# Patient Record
Sex: Female | Born: 1950
Health system: Southern US, Community
[De-identification: ages and names within clinical notes are randomized; demographics above are authoritative.]

## PROBLEM LIST (undated history)

## (undated) DIAGNOSIS — E039 Hypothyroidism, unspecified: Secondary | ICD-10-CM

## (undated) DIAGNOSIS — T7840XA Allergy, unspecified, initial encounter: Secondary | ICD-10-CM

## (undated) DIAGNOSIS — N959 Unspecified menopausal and perimenopausal disorder: Secondary | ICD-10-CM

## (undated) DIAGNOSIS — F329 Major depressive disorder, single episode, unspecified: Secondary | ICD-10-CM

## (undated) DIAGNOSIS — K219 Gastro-esophageal reflux disease without esophagitis: Secondary | ICD-10-CM

## (undated) DIAGNOSIS — E785 Hyperlipidemia, unspecified: Secondary | ICD-10-CM

## (undated) DIAGNOSIS — I252 Old myocardial infarction: Secondary | ICD-10-CM

## (undated) DIAGNOSIS — M810 Age-related osteoporosis without current pathological fracture: Secondary | ICD-10-CM

## (undated) DIAGNOSIS — F419 Anxiety disorder, unspecified: Secondary | ICD-10-CM

## (undated) DIAGNOSIS — M353 Polymyalgia rheumatica: Secondary | ICD-10-CM

## (undated) DIAGNOSIS — F32A Depression, unspecified: Secondary | ICD-10-CM

## (undated) DIAGNOSIS — M1990 Unspecified osteoarthritis, unspecified site: Secondary | ICD-10-CM

## (undated) DIAGNOSIS — H269 Unspecified cataract: Secondary | ICD-10-CM

## (undated) DIAGNOSIS — I2510 Atherosclerotic heart disease of native coronary artery without angina pectoris: Secondary | ICD-10-CM

## (undated) HISTORY — DX: Age-related osteoporosis without current pathological fracture: M81.0

## (undated) HISTORY — DX: Unspecified osteoarthritis, unspecified site: M19.90

## (undated) HISTORY — PX: EYE SURGERY: SHX253

## (undated) HISTORY — DX: Unspecified cataract: H26.9

## (undated) HISTORY — PX: PTCA: SHX146

## (undated) HISTORY — DX: Depression, unspecified: F32.A

## (undated) HISTORY — DX: Unspecified menopausal and perimenopausal disorder: N95.9

## (undated) HISTORY — DX: Allergy, unspecified, initial encounter: T78.40XA

## (undated) HISTORY — DX: Gastro-esophageal reflux disease without esophagitis: K21.9

## (undated) HISTORY — DX: Anxiety disorder, unspecified: F41.9

## (undated) HISTORY — PX: CATARACT EXTRACTION: SUR2

## (undated) HISTORY — DX: Hyperlipidemia, unspecified: E78.5

## (undated) HISTORY — DX: Atherosclerotic heart disease of native coronary artery without angina pectoris: I25.10

## (undated) HISTORY — PX: COSMETIC SURGERY: SHX468

## (undated) HISTORY — DX: Old myocardial infarction: I25.2

## (undated) HISTORY — DX: Major depressive disorder, single episode, unspecified: F32.9

---

## 1998-11-15 ENCOUNTER — Encounter (INDEPENDENT_AMBULATORY_CARE_PROVIDER_SITE_OTHER): Payer: Self-pay | Admitting: Specialist

## 1998-11-15 ENCOUNTER — Other Ambulatory Visit: Admission: RE | Admit: 1998-11-15 | Discharge: 1998-11-15 | Payer: Self-pay | Admitting: Obstetrics & Gynecology

## 1999-02-13 DIAGNOSIS — E039 Hypothyroidism, unspecified: Secondary | ICD-10-CM | POA: Insufficient documentation

## 1999-08-17 ENCOUNTER — Other Ambulatory Visit: Admission: RE | Admit: 1999-08-17 | Discharge: 1999-08-17 | Payer: Self-pay | Admitting: Obstetrics & Gynecology

## 2000-09-19 ENCOUNTER — Other Ambulatory Visit: Admission: RE | Admit: 2000-09-19 | Discharge: 2000-09-19 | Payer: Self-pay | Admitting: Obstetrics & Gynecology

## 2001-09-22 ENCOUNTER — Emergency Department (HOSPITAL_COMMUNITY): Admission: EM | Admit: 2001-09-22 | Discharge: 2001-09-22 | Payer: Self-pay

## 2001-09-29 ENCOUNTER — Other Ambulatory Visit: Admission: RE | Admit: 2001-09-29 | Discharge: 2001-09-29 | Payer: Self-pay | Admitting: Obstetrics & Gynecology

## 2002-06-20 ENCOUNTER — Encounter: Payer: Self-pay | Admitting: Internal Medicine

## 2002-06-20 ENCOUNTER — Encounter: Admission: RE | Admit: 2002-06-20 | Discharge: 2002-06-20 | Payer: Self-pay | Admitting: Internal Medicine

## 2002-07-02 ENCOUNTER — Encounter: Admission: RE | Admit: 2002-07-02 | Discharge: 2002-07-02 | Payer: Self-pay | Admitting: Internal Medicine

## 2002-07-02 ENCOUNTER — Encounter: Payer: Self-pay | Admitting: Internal Medicine

## 2003-05-07 ENCOUNTER — Emergency Department (HOSPITAL_COMMUNITY): Admission: EM | Admit: 2003-05-07 | Discharge: 2003-05-07 | Payer: Self-pay | Admitting: Emergency Medicine

## 2003-05-07 ENCOUNTER — Emergency Department (HOSPITAL_COMMUNITY): Admission: AD | Admit: 2003-05-07 | Discharge: 2003-05-07 | Payer: Self-pay | Admitting: Family Medicine

## 2003-06-22 ENCOUNTER — Emergency Department (HOSPITAL_COMMUNITY): Admission: EM | Admit: 2003-06-22 | Discharge: 2003-06-22 | Payer: Self-pay | Admitting: Family Medicine

## 2004-04-07 ENCOUNTER — Ambulatory Visit: Payer: Self-pay | Admitting: Internal Medicine

## 2004-04-12 ENCOUNTER — Ambulatory Visit: Payer: Self-pay | Admitting: Internal Medicine

## 2004-05-24 ENCOUNTER — Ambulatory Visit: Payer: Self-pay | Admitting: Internal Medicine

## 2004-06-14 ENCOUNTER — Ambulatory Visit: Payer: Self-pay | Admitting: Internal Medicine

## 2004-06-29 ENCOUNTER — Ambulatory Visit: Payer: Self-pay | Admitting: Internal Medicine

## 2004-06-29 ENCOUNTER — Inpatient Hospital Stay (HOSPITAL_COMMUNITY): Admission: EM | Admit: 2004-06-29 | Discharge: 2004-07-03 | Payer: Self-pay | Admitting: Emergency Medicine

## 2004-06-30 ENCOUNTER — Ambulatory Visit: Payer: Self-pay | Admitting: *Deleted

## 2004-07-13 ENCOUNTER — Ambulatory Visit: Payer: Self-pay | Admitting: Cardiology

## 2004-07-27 ENCOUNTER — Ambulatory Visit: Payer: Self-pay | Admitting: *Deleted

## 2004-08-23 ENCOUNTER — Ambulatory Visit: Payer: Self-pay | Admitting: Cardiology

## 2004-10-26 ENCOUNTER — Ambulatory Visit: Payer: Self-pay | Admitting: Cardiology

## 2005-07-05 ENCOUNTER — Ambulatory Visit: Payer: Self-pay | Admitting: Internal Medicine

## 2005-09-17 ENCOUNTER — Ambulatory Visit: Payer: Self-pay | Admitting: Cardiology

## 2005-10-16 ENCOUNTER — Ambulatory Visit: Payer: Self-pay | Admitting: Internal Medicine

## 2006-03-08 ENCOUNTER — Ambulatory Visit: Payer: Self-pay | Admitting: Internal Medicine

## 2007-03-20 ENCOUNTER — Ambulatory Visit: Payer: Self-pay | Admitting: Internal Medicine

## 2007-03-20 DIAGNOSIS — N959 Unspecified menopausal and perimenopausal disorder: Secondary | ICD-10-CM | POA: Insufficient documentation

## 2007-03-20 DIAGNOSIS — F32A Depression, unspecified: Secondary | ICD-10-CM | POA: Insufficient documentation

## 2007-03-20 DIAGNOSIS — F329 Major depressive disorder, single episode, unspecified: Secondary | ICD-10-CM

## 2007-03-20 DIAGNOSIS — J189 Pneumonia, unspecified organism: Secondary | ICD-10-CM | POA: Insufficient documentation

## 2007-03-20 HISTORY — DX: Unspecified menopausal and perimenopausal disorder: N95.9

## 2007-03-24 ENCOUNTER — Ambulatory Visit: Payer: Self-pay | Admitting: Cardiology

## 2007-09-01 ENCOUNTER — Ambulatory Visit: Payer: Self-pay | Admitting: Internal Medicine

## 2007-09-01 LAB — CONVERTED CEMR LAB
Basophils Absolute: 0 10*3/uL (ref 0.0–0.1)
Basophils Relative: 0.8 % (ref 0.0–3.0)
Bilirubin, Direct: 0.1 mg/dL (ref 0.0–0.3)
Calcium: 9.4 mg/dL (ref 8.4–10.5)
Eosinophils Absolute: 0.3 10*3/uL (ref 0.0–0.7)
Eosinophils Relative: 5.5 % — ABNORMAL HIGH (ref 0.0–5.0)
GFR calc Af Amer: 111 mL/min
Hemoglobin: 13.3 g/dL (ref 12.0–15.0)
LDL Cholesterol: 130 mg/dL — ABNORMAL HIGH (ref 0–99)
MCV: 91.5 fL (ref 78.0–100.0)
Monocytes Absolute: 0.4 10*3/uL (ref 0.1–1.0)
Neutro Abs: 3.5 10*3/uL (ref 1.4–7.7)
RBC: 4.22 M/uL (ref 3.87–5.11)
Sodium: 139 meq/L (ref 135–145)
Total Bilirubin: 1 mg/dL (ref 0.3–1.2)
Total CHOL/HDL Ratio: 4
Triglycerides: 83 mg/dL (ref 0–149)
WBC: 5.4 10*3/uL (ref 4.5–10.5)

## 2007-09-09 ENCOUNTER — Ambulatory Visit: Payer: Self-pay | Admitting: Internal Medicine

## 2007-09-09 DIAGNOSIS — I252 Old myocardial infarction: Secondary | ICD-10-CM

## 2007-09-09 DIAGNOSIS — R319 Hematuria, unspecified: Secondary | ICD-10-CM | POA: Insufficient documentation

## 2007-09-09 DIAGNOSIS — I2510 Atherosclerotic heart disease of native coronary artery without angina pectoris: Secondary | ICD-10-CM | POA: Insufficient documentation

## 2007-09-09 HISTORY — DX: Old myocardial infarction: I25.2

## 2007-09-09 LAB — HM MAMMOGRAPHY

## 2007-11-21 ENCOUNTER — Encounter: Payer: Self-pay | Admitting: Internal Medicine

## 2007-12-02 ENCOUNTER — Encounter: Payer: Self-pay | Admitting: Internal Medicine

## 2007-12-03 ENCOUNTER — Telehealth: Payer: Self-pay | Admitting: Internal Medicine

## 2008-02-19 ENCOUNTER — Ambulatory Visit: Payer: Self-pay | Admitting: Cardiology

## 2008-02-25 ENCOUNTER — Ambulatory Visit: Payer: Self-pay | Admitting: Internal Medicine

## 2008-02-25 ENCOUNTER — Encounter: Payer: Self-pay | Admitting: Internal Medicine

## 2008-02-25 LAB — CONVERTED CEMR LAB
HDL: 45.6 mg/dL (ref >39.0)
Total CHOL/HDL Ratio: 3.9
VLDL: 10 mg/dL (ref 0–40)

## 2008-03-29 ENCOUNTER — Telehealth: Payer: Self-pay | Admitting: Internal Medicine

## 2008-04-08 ENCOUNTER — Telehealth: Payer: Self-pay | Admitting: Internal Medicine

## 2008-08-08 ENCOUNTER — Emergency Department (HOSPITAL_COMMUNITY): Admission: EM | Admit: 2008-08-08 | Discharge: 2008-08-08 | Payer: Self-pay | Admitting: Emergency Medicine

## 2008-12-06 ENCOUNTER — Encounter: Payer: Self-pay | Admitting: Internal Medicine

## 2008-12-06 ENCOUNTER — Telehealth: Payer: Self-pay | Admitting: Internal Medicine

## 2008-12-06 ENCOUNTER — Encounter: Payer: Self-pay | Admitting: Cardiology

## 2009-01-10 ENCOUNTER — Ambulatory Visit: Payer: Self-pay | Admitting: Internal Medicine

## 2009-01-10 ENCOUNTER — Telehealth: Payer: Self-pay | Admitting: Internal Medicine

## 2009-01-11 LAB — CONVERTED CEMR LAB
BUN: 9 mg/dL (ref 6–23)
Basophils Relative: 1.2 % (ref 0.0–3.0)
CO2: 30 meq/L (ref 19–32)
Calcium: 8.9 mg/dL (ref 8.4–10.5)
Cholesterol: 260 mg/dL — ABNORMAL HIGH (ref 0–200)
Direct LDL: 193.2 mg/dL
Eosinophils Absolute: 0.4 10*3/uL (ref 0.0–0.7)
Eosinophils Relative: 7.6 % — ABNORMAL HIGH (ref 0.0–5.0)
Hemoglobin: 12.9 g/dL (ref 12.0–15.0)
Lymphs Abs: 1.3 10*3/uL (ref 0.7–4.0)
MCHC: 33.7 g/dL (ref 30.0–36.0)
Monocytes Absolute: 0.3 10*3/uL (ref 0.1–1.0)
Neutro Abs: 3 10*3/uL (ref 1.4–7.7)
Platelets: 208 10*3/uL (ref 150.0–400.0)
Potassium: 4.5 meq/L (ref 3.5–5.1)
RBC: 4.14 M/uL (ref 3.87–5.11)
Total Bilirubin: 0.7 mg/dL (ref 0.3–1.2)
Total Protein: 7 g/dL (ref 6.0–8.3)
Triglycerides: 57 mg/dL (ref 0.0–149.0)
VLDL: 11.4 mg/dL (ref 0.0–40.0)
WBC: 5.1 10*3/uL (ref 4.5–10.5)

## 2009-03-10 ENCOUNTER — Ambulatory Visit: Payer: Self-pay | Admitting: Internal Medicine

## 2009-03-23 ENCOUNTER — Encounter: Payer: Self-pay | Admitting: Internal Medicine

## 2009-03-24 ENCOUNTER — Telehealth: Payer: Self-pay | Admitting: *Deleted

## 2009-06-08 ENCOUNTER — Encounter: Payer: Self-pay | Admitting: Internal Medicine

## 2009-06-30 ENCOUNTER — Ambulatory Visit (HOSPITAL_COMMUNITY): Admission: RE | Admit: 2009-06-30 | Discharge: 2009-06-30 | Payer: Self-pay | Admitting: Obstetrics & Gynecology

## 2009-07-12 ENCOUNTER — Telehealth: Payer: Self-pay | Admitting: Internal Medicine

## 2009-10-27 ENCOUNTER — Telehealth: Payer: Self-pay | Admitting: Internal Medicine

## 2010-01-19 ENCOUNTER — Telehealth: Payer: Self-pay | Admitting: Internal Medicine

## 2010-01-20 ENCOUNTER — Emergency Department (HOSPITAL_COMMUNITY)
Admission: EM | Admit: 2010-01-20 | Discharge: 2010-01-21 | Payer: Self-pay | Source: Home / Self Care | Admitting: Emergency Medicine

## 2010-01-23 ENCOUNTER — Encounter: Payer: Self-pay | Admitting: Internal Medicine

## 2010-03-14 NOTE — Letter (Signed)
Summary: [facility]   Imported By: Maryln Gottron 06/20/2009 11:19:30  _____________________________________________________________________  External Attachment:    Type:   Image     Comment:   External Document

## 2010-03-14 NOTE — Progress Notes (Signed)
Summary: positive strep  Phone Note Call from Patient Call back at [facility] Phone [phone]   Summary of Call: Tested positive strept this am on the job, [employer].  Request med.  [facility].  Allergic Septra. Initial call taken by: Rudy Jew, RN,  Jul 12, 2009 8:38 AM  Follow-up for Phone Call        Phone Call Completed Follow-up by: Rudy Jew, RN,  Jul 12, 2009 2:33 PM    New/Updated Medications: AMOXICILLIN 500 MG TABS (AMOXICILLIN) Take 1 tablet by mouth three times a day Prescriptions: AMOXICILLIN 500 MG TABS (AMOXICILLIN) Take 1 tablet by mouth three times a day  #30 x 0   Entered and Authorized by:   Birdie Sons MD   Signed by:   Birdie Sons MD on 07/12/2009   Method used:   Electronically to        [facility] [address] (retail)       [address]       [place], [place]  [address]       Ph: [phone]       Fax: [phone]   RxID:   [id number]

## 2010-03-14 NOTE — Letter (Signed)
Summary: [facility]  [facility]   Imported By: Maryln Gottron 07/27/2009 14:22:40  _____________________________________________________________________  External Attachment:    Type:   Image     Comment:   External Document

## 2010-03-14 NOTE — Progress Notes (Signed)
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 Will fax results, would like med called in. ph- [phone] x [phone] Initial call taken by: Raechel Ache, RN,  March 24, 2009 10:01 AM  Follow-up for Phone Call        fax received and reviewed by dr Lovell Sheehan.per dr Lovell Sheehan cipro 500mg  two times a day x 5 days.  see Rx.  Left message on work phone to notify pt. Follow-up by: Gladis Riffle, RN,  March 24, 2009 12:59 PM    New/Updated Medications: CIPROFLOXACIN HCL 500 MG TABS (CIPROFLOXACIN HCL) Take 1 tablet by mouth two times a day x 5 days Prescriptions: CIPROFLOXACIN HCL 500 MG TABS (CIPROFLOXACIN HCL) Take 1 tablet by mouth two times a day x 5 days  #10 x 0   Entered by:   Gladis Riffle, RN   Authorized by:   Stacie Glaze MD   Signed by:   Gladis Riffle, RN on 03/24/2009   Method used:   Electronically to        [facility] (retail)       [address]       [place], [place]  [address]       Ph: [phone]       Fax: [phone]   RxID:   [id number]

## 2010-03-14 NOTE — Letter (Signed)
Summary: [facility]  [facility]   Imported By: Kassie Mends 02/28/2009 08:28:53  _____________________________________________________________________  External Attachment:    Type:   Image     Comment:   External Document

## 2010-03-14 NOTE — Progress Notes (Signed)
Summary: requesting 1 Xanax to be called  Phone Note Call from Patient Call back at Home Phone [phone]   Caller: Patient---triage vm Summary of Call: Having minor surgery done. requesting a rx for xanax lowest strength #1, in order for her to relax during numbing . please call [facility] [address]. Initial call taken by: Warnell Forester,  January 19, 2010 9:41 AM  Follow-up for Phone Call        not a good idea to mix medications Follow-up by: Birdie Sons MD,  January 19, 2010 6:06 PM  Additional Follow-up for Phone Call Additional follow up Details #1::        l/m on pt's cell phone with instructions Additional Follow-up by: Alfred Levins, CMA,  January 20, 2010 8:29 AM

## 2010-03-14 NOTE — Progress Notes (Signed)
Summary: sleep RX  Phone Note Call from Patient Call back at Home Phone [phone]   Caller: Patient Call For: Birdie Sons MD Summary of Call: Pt is asking for Rx for sleep......Marland KitchenHusband has lost job, and son has moved back home.  [facility])   Initial call taken by: Lynann Beaver CMA,  October 27, 2009 11:34 AM    New/Updated Medications: TRAZODONE HCL 50 MG TABS (TRAZODONE HCL) 1/2-1 by mouth at bedtime as needed insomnia Prescriptions: TRAZODONE HCL 50 MG TABS (TRAZODONE HCL) 1/2-1 by mouth at bedtime as needed insomnia  #30 x 1   Entered and Authorized by:   Birdie Sons MD   Signed by:   Birdie Sons MD on 10/27/2009   Method used:   Electronically to        [facility] [address] (retail)       [address]       [place], [place]  [address]       Ph: [phone]       Fax: [phone]   RxID:   [id number]  Notified Pt.

## 2010-03-16 ENCOUNTER — Other Ambulatory Visit: Payer: Self-pay | Admitting: Internal Medicine

## 2010-03-16 DIAGNOSIS — E0590 Thyrotoxicosis, unspecified without thyrotoxic crisis or storm: Secondary | ICD-10-CM

## 2010-03-16 NOTE — Letter (Signed)
Summary: [facility]   Imported By: Maryln Gottron 02/08/2010 10:13:00  _____________________________________________________________________  External Attachment:    Type:   Image     Comment:   External Document

## 2010-05-30 ENCOUNTER — Other Ambulatory Visit (HOSPITAL_COMMUNITY): Payer: Self-pay | Admitting: Obstetrics & Gynecology

## 2010-05-30 DIAGNOSIS — Z1231 Encounter for screening mammogram for malignant neoplasm of breast: Secondary | ICD-10-CM

## 2010-06-27 NOTE — Assessment & Plan Note (Signed)
[facility]                            CARDIOLOGY OFFICE NOTE   Meghan, [name]                       MRN:          [id number]  DATE:03/24/2007                            DOB:          Aug 02, 1950    PRIMARY CARE PHYSICIAN:  Meghan Mole. Swords, MD   REASON FOR PRESENTATION:  Non-Q-wave myocardial infarction.   HISTORY OF PRESENT ILLNESS:  The patient is now 60 years old.  It has  been about 18 months since I last saw her.  She had been doing well.  She had a non-Q-wave myocardial infarction in 2006.  She had chest pain  at that time.  Since then, she has had no further chest discomfort.  She  walks 3-4 times per week for exercise.  With this level of activity, she  denies any chest discomfort, neck discomfort, arm discomfort, activity  induced nausea, vomiting, excessive diaphoresis. He had no palpitations,  pre-syncope, syncope.  She denies any PND or orthopnea.   PAST MEDICAL HISTORY:  1. Coronary artery disease (ejection fraction 60%, LAD normal,      circumflex obtuse marginal was a 1.5 mm vessel with 99% stenosis.      The patient had angioplasty with dissection with reserve residual      50% stenosis.  She had a dominant right coronary artery which was      normal.).  2. Dyslipidemia.  3. Mild depression.  4. Hypothyroidism.  5. Osteoporosis.  6. Old cerebrovascular accident on MRI.  7. Cesarean section.   ALLERGIES:  COMPAZINE.   MEDICATIONS:  1. Aspirin 81 mg daily.  2. Synthroid.  3. Micrograms daily.  4. Paxil 20 mg daily.  5. Simvastatin.   REVIEW OF SYSTEMS:  As stated as stated in HPI and otherwise negative  for other systems.   PHYSICAL EXAMINATION:  The patient is in no distress.  Blood pressure  106/76, heart rate 71 and regular, weight 151 pounds, body mass index  27.  HEENT:  Eyelids are, pupils equal, round, reactive, fundi not  visualized, oral mucosa normal.  NECK:  No jugular distention at 45 degrees, carotid  upstroke brisk and  symmetrical.  No bruits, no thyromegaly.  LYMPHATICS: No cervical, axillary or inguinal adenopathy.  LUNGS: Clear to auscultation bilaterally.  BACK: No costovertebral angle tenderness.  CHEST: Unremarkable.  HEART: PMI not displaced or sustained. S1, S2 within normal limits. No  S3. No S4. No clicks, rub or murmurs.  ABDOMEN: Flat, positive bowel sounds, normal in frequency and pitch. No  bruits. No rebounds. No guarding. No midline pulsatile mass. No  hepatomegaly, no splenomegaly.  SKIN: No rashes, no nodules.  EXTREMITIES: 2+ pulses throughout. No edema. No cyanosis or clubbing.  NEURO: Oriented to person, place and time. Cranial nerves II-XII grossly  intact. Motor grossly intact.   EKG: Sinus rhythm, rate 67, axis within normal limits, intervals within  normal limits, no acute ST-T wave changes, nonspecific T-wave  flattening.   ASSESSMENT/PLAN:  1. Coronary disease.  Patient is having no ongoing symptoms.  She is      having good  secondary risk reduction.  At this point, no further      cardiovascular testing is suggested.  2. Followup.  The patient will follow with Dr. Cato Mulligan and come back to      see me as needed.     Rollene Rotunda, MD, [facility]  Electronically Signed    JH/MedQ  DD: 03/24/2007  DT: 03/24/2007  Job #: [id number]   cc:   Meghan Mole. Swords, MD

## 2010-06-27 NOTE — Assessment & Plan Note (Signed)
[facility]                            CARDIOLOGY OFFICE NOTE   [name], SAPERSTEIN                       MRN:          [id number]  DATE:02/19/2008                            DOB:          [birth year]    PRIMARY CARE PHYSICIAN:  Valetta Mole. Swords, MD   REASON FOR PRESENTATION:  Evaluate the patient with non-Q-wave  myocardial infarction.   HISTORY OF PRESENT ILLNESS:  The patient is 60 years old.  She is  referred back for preoperative evaluation.  Dr. Ophelia Charter is sending her.  He is planning on doing eyelid surgery.  I have been following her after  non-Q-wave myocardial infarction, which she was due to come back to this  clinic only as needed.  She is followed by Dr. Cato Mulligan.   Since I last saw her in February, she has had no new chest discomfort.  She is not exercising as much as I would like, though she is walking 3  times a day and sometimes using a rolling machine in the house.  She can  climb a flight of stairs (greater than 5 METS).  With this level of  activity, she denies any chest discomfort, neck or arm discomfort.  She  has no palpitations, presyncope, or syncope.  She denies any PND or  orthopnea.  She thinks her breathing is fine and she is not having any  change in the level of activity.   She did have her lipids checked with an LDL of 130 and had her Zocor  increased from 40 to 80.  She is due to have this checked next month by  Dr. Cato Mulligan.   PAST MEDICAL HISTORY:  Coronary artery disease (EF 60%, LAD normal,  circumflex obtuse marginal is 1.5 mm vessel with 99% stenosis.  The  patient had an angioplasty with dissection with residual 50% stenosis.  She had a dominant right coronary artery, which was normal),  dyslipidemia, mild depression, hypothyroidism, osteoporosis, old  cerebrovascular accident on MRI, and cesarean section.   ALLERGIES:  COMPAZINE.   MEDICATIONS:  1. Zocor 80 mg daily.  2. Aspirin 81 mg daily.  3. Synthroid  100 mcg daily.  4. Paxil 20 mg daily.   REVIEW OF SYSTEMS:  As stated in the HPI and otherwise negative for all  other systems.   PHYSICAL EXAMINATION:  GENERAL:  The patient is pleasant in no distress.  VITAL SIGNS:  Blood pressure of 126/79, heart rate 70 and regular,  weight 150 pounds, and body mass index is 27.  HEENT:  Eyelids unremarkable.  Pupils are equal, round, and reactive to  light.  Fundi not visualized.  Oral mucosa unremarkable.  NECK:  No jugular venous distention at 45 degrees.  Carotid upstroke  brisk and symmetric.  No bruits.  No thyromegaly.  LYMPHATICS:  No cervical, axillary, or inguinal adenopathy.  LUNGS:  Clear to auscultation bilaterally.  BACK:  No costovertebral angle tenderness.  CHEST:  Unremarkable.  HEART:  PMI not displaced or sustained.  S1 and S2 within normal limits.  No S3.  No S4.  No clicks.  No rubs.  No murmurs.  ABDOMEN:  Obese.  Positive bowel sounds.  Normal in frequency and pitch.  No bruits.  No rebound.  No guarding.  No midline pulsatile mass.  No  hepatomegaly.  No splenomegaly.  SKIN:  No rashes.  No nodule.  EXTREMITIES:  Pulses 2+ throughout.  No edema.  No cyanosis.  No  clubbing.  NEUROLOGIC:  Oriented to person, place, and time.  Cranial nerves II  through XII grossly intact.  Motor grossly intact.   ASSESSMENT AND PLAN:  1. Preoperative clearance.  The patient has no overt symptoms.  She      has a high functional level.  She is going for a low risk study      from a cardiovascular standpoint.  Therefore, according to ACC/AHA      guidelines, the patient is at acceptable risk for surgery without      further evaluation.  She can continue the meds as listed.  The      aspirin would be held after the discussion of the surgeon.  It is      felt safe to continue this with surgery.  2. Dyslipidemia.  Her LDL was 130.  She will get about a 6% reduction      with doubling of the simvastatin.  I would suggest this, if this is       the case, would need to be further treated.  The goal should be      less than 100 with an HDL greater than 50.  She will probably need      to be switched to different agent.  Dr. Cato Mulligan will follow up on      this.  3. Hypothyroidism, productive source.  4. Mild depression.  She remains on Paxil.  5. Followup.  She will come back to this clinic as needed.     Rollene Rotunda, MD, [facility]  Electronically Signed    JH/MedQ  DD: 02/19/2008  DT: 02/20/2008  Job #: [id number]   cc:   Valetta Mole. Swords, MD

## 2010-06-30 NOTE — Cardiovascular Report (Signed)
NAMEEMMACLAIRE, SWITALA                ACCOUNT NO.:  [id number]   MEDICAL RECORD NO.:  [id number]          PATIENT TYPE:  INP   LOCATION:  6523                         FACILITY:  [facility]   PHYSICIAN:  Carole Binning, M.D. LHCDATE OF BIRTH:  [birth year]   DATE OF PROCEDURE:  06/30/2004  DATE OF DISCHARGE:                              CARDIAC CATHETERIZATION   PROCEDURE PERFORMED:  1.  Left heart catheterization, left coronary angiography and left      ventriculography.  2.  Percutaneous transluminal coronary angioplasty of the first obtuse      marginal branch.   INDICATIONS:  The patient is a 60 year old woman with multiple cardiac risk  factors.  She was admitted to hospital with chest pain and ruled in for non-  ST-segment-elevation myocardial infarction.   CATHETERIZATION PROCEDURAL NOTE:  A 6-French sheath was placed in the right  femoral artery.  Coronary angiography was performed using 6-French JL-3.5  and JR4 catheters.  Left ventriculography was performed with an angled  pigtail catheter.  Contrast was Omnipaque.  There were no complications.   RESULTS:   HEMODYNAMICS:  Left ventricular pressure 152/14.  Aortic pressure 166/88.  There is no aortic valve gradient.   LEFT VENTRICULOGRAM:  Wall motion is normal, ejection fraction estimated at  60%.  There is no mitral regurgitation.   CORONARY ARTERIOGRAPHY:  Left main is normal.   Left anterior descending artery gives rise to 2 small- to normal-sized  diagonal branches.  The LAD is normal.   Left circumflex gives rise to a very long but slender first obtuse marginal  branch and small second and third obtuse marginal branches.  The first  obtuse marginal branch has a diffuse 99% stenosis with TIMI-2 flow.  This is  an approximately 1.5-mm-diameter vessel.   Right coronary is a dominant vessel.  The right coronary is normal in  appearance, giving rise to a large posterior descending artery, small first  and second  posterolateral branch and a large third posterolateral branch.   IMPRESSIONS:  1.  Preserved left ventricular systolic function.  2.  One-vessel coronary artery disease characterized by a subtotal occlusion      of a small-caliber, but fairly long first obtuse marginal branch.   PLAN:  Percutaneous coronary intervention.   PERCUTANEOUS TRANSLUMINAL CORONARY ANGIOPLASTY PROCEDURAL NOTE:  Following  completion of the diagnostic catheterization, we proceeded with percutaneous  coronary intervention.  Heparin and Integrilin were administered per  protocol.  We used a 6-French CLS 3.0 guiding catheter.  A Hi-Torque floppy  wire was advanced under fluoroscopic guidance into the distal aspect of the  first obtuse marginal branch.  We then performed PTCA with a 1.5 x 20-mm  Maverick balloon.  We performed multiple balloon inflations ranging from 8-  15 atmospheres in pressure.  These were performed throughout the length of  the lesion, which was approximately 30-mm long.  Multiple doses of  intracoronary nitroglycerin were administered as well.  Angiographic images  were obtained which revealed improvement of vessel lumen; however, there was  a long residual 50% stenosis with evidence of probable dissection  with  adventitial staining in the proximal portion of the vessel.  There was TIMI-  3 flow into the distal vessel.  These changes persisted,  despite multiple  balloon inflations, however, did improve somewhat after the final balloon  inflations.  The vessel was much too small-caliber to consider stent  placement.  We therefore opted to discontinue the procedure at that point.  The patient was hemodynamically stable.  She was transported to the holding  area in good condition.   COMPLICATIONS:  None.   RESULTS:  PTCA of the first obtuse marginal branch with limited success.  A  long 99% stenosis with TIMI-2 flow was reduced to long 50% residual with  TIMI-3 flow and what appeared to be a  dissection with adventitial staining.   PLAN:  The patient be admitted to the CCU for observation.  The Integrilin  will be discontinued at this point and she will be monitored closely.  She  will be treated medically for her residual coronary artery disease with  medications include aspirin.  In addition, Plavix will be added in the  morning if she remains stable.      MWP/MEDQ  D:  06/30/2004  T:  07/01/2004  Job:  [id number]   cc:   Valetta Mole. Swords, M.D. [facility]   Rollene Rotunda, M.D.   [facility]

## 2010-06-30 NOTE — Assessment & Plan Note (Signed)
[facility]                              CARDIOLOGY OFFICE NOTE   [name], [name]                       MRN:          [id number]  DATE:09/17/2005                            DOB:          10-30-1950    The primary is Bruce H. Swords, MD   REASON FOR PRESENTATION:  Evaluate patient with non-Q-wave myocardial  infarction.   HISTORY OF PRESENT ILLNESS:  The patient is 60 years old.  She has a history  of coronary disease as described below.  She has done very well since I last  saw her.  She did have to come off her beta blocker because it made her  hypotensive, lethargic and lightheaded.  She has had no chest discomfort.  She walks daily.  She is active with her chores of daily living.  She cannot  bring on any of the discomfort that prompted her hospitalization.  She has  had no neck or arm discomfort.  She has had no palpitations, presyncope or  syncope.  She has no PND or orthopnea.   PAST MEDICAL HISTORY:  1.  Coronary artery disease (EF 60%, LAD normal, circumflex obtuse marginal      a 1.5 mm vessel with 99% stenosis, the patient had angioplasty with      dissection and a residual 50% stenosis.  She had a dominant right      coronary artery, which was normal).  2.  Dyslipidemia.  3.  Mild depression.  4.  Hypothyroidism.  5.  Osteoporosis.  6.  Old cerebrovascular accident on MRI.  7.  Cesarean section.   ALLERGIES:  COMPAZINE.   MEDICATIONS:  1.  Lipitor 40 mg q.h.s.  2.  Plavix 75 mg daily.  3.  Paxil 20 mg daily.  4.  Synthroid 100 mcg daily.  5.  Aspirin 81 mg daily.   REVIEW OF SYSTEMS:  As stated in the HPI and otherwise negative for other  systems.   PHYSICAL EXAMINATION:  GENERAL:  The patient is in no distress.  VITAL SIGNS:  Blood pressure 118/78, heart rate 69 and regular.  NECK:  No jugular venous distention, wave form within normal limits, carotid  upstroke brisk and symmetric, no bruits, no thyromegaly.  LYMPHATIC:  __________.  LUNGS:  Clear to auscultation bilaterally.  BACK:  No costovertebral angle tenderness.  CHEST:  Unremarkable.  CARDIAC:  PMI not displaced or sustained, S1 and S2 within normal limits, no  S3, no S4, no murmurs.  ABDOMEN:  Flat, positive bowel sounds, normal in frequency and pitch, no  bruits, no rebound, no guarding, no midline pulsatile mass, no organomegaly.  SKIN:  Multiple ecchymoses, no nodules, rashes.  NEUROLOGIC:  Grossly intact.   EKG:  Low voltage on the chest leads, axis within normal limits, intervals  within normal limits, no acute ST wave changes, poor anterior R wave  progression.   ASSESSMENT AND PLAN:  1.  Coronary disease.  The patient had single-vessel small branch stenosis.      She has not had any recurrent symptoms.  She is bruising with the  Plavix.  At this point I do not see an absolute indication to continue      this, as I think it would be of marginal benefit.  She will discontinue      the Plavix but will continue the aspirin.  2.  Risk reduction.  Her primary therapy will be lipid management per Dr.      Cato Mulligan.  It would probably be prudent in this woman to lower her LDL      goal to 70s or lower with an HDL in the 50s.  3.  Follow-up.  We will see her back in about 18 months or sooner if there      are any problems.                               Rollene Rotunda, MD, [facility]    JH/MedQ  DD:  09/17/2005  DT:  09/17/2005  Job #:  [id number]   cc:   Valetta Mole. Swords, MD

## 2010-06-30 NOTE — Consult Note (Signed)
[name], [name]                ACCOUNT NO.:  [id number]   MEDICAL RECORD NO.:  [id number]          PATIENT TYPE:  INP   LOCATION:  6523                         FACILITY:  [facility]   PHYSICIAN:  Rollene Rotunda, M.D.   DATE OF BIRTH:  [birth year]   DATE OF CONSULTATION:  06/30/2004  DATE OF DISCHARGE:                                   CONSULTATION   REASON FOR CONSULTATION:  Evaluate patient with chest pain.   HISTORY OF PRESENT ILLNESS:  The patient is a pleasant 60 year old with no  prior cardiac history.  She developed chest pain on Wednesday afternoon  while at work.  She said this was kind of a squeezing.  It was not really  severe and she ignored it. However, when she awoke on Thursday morning, she  had a chest discomfort that was more severe.  She did go on to work.  However, when she walked into her office building she felt diaphoretic and  shortness of breath and somewhat nauseated.  She had the chest discomfort  which was 10/10, eventually, and radiated to her neck.  She came to the  emergency room.  She received sublingual nitroglycerin x1 and aspirin.  The  pain improved to 4/10, and then gradually resolved.  She was free by 10 a.m.  yesterday.  She had no acute ST-T wave changes.  She subsequently has had  mild troponin elevations as listed below.  She has had no further chest  discomfort.   In retrospect, she had not had any of these symptoms prior to Wednesday.  She has been active, though she does not exercise routinely.  She has not  had any arm discomfort.  She has not had any palpitations, pre-syncope, or  syncope.  She has had no PND or orthopnea.  She has never had symptoms like  this before.   PAST MEDICAL HISTORY:  1.  Hyperlipidemia (the patient stopped Zocor after her sister developed      some liver abnormalities and subsequently had a long complicated course      resulting in her death after she had a liver biopsy).  2.  Mild depression, well treated.  3.  Hypothyroidism.  4.  Osteoporosis.  5.  Old cerebrovascular accident on MRI.   PAST SURGICAL HISTORY:  Cesarean section.   ALLERGIES:  COMPAZINE.   CURRENT MEDICATIONS:  1.  Synthroid 50 mcg daily.  2.  Paxil 20 mg daily.   SOCIAL HISTORY:  The patient lives in [place] with her husband.  She is a  LPN for a pediatrician.  She never smoked cigarettes.  She does not drink  alcohol.   FAMILY HISTORY:  Contributory for her father having myocardial infarctions  in his 55s and CABG in his 52s.  He is alive in his 24s.   REVIEW OF SYSTEMS:  As stated in the HPI.  Positive for mild joint pains.  Negative for all other systems.   PHYSICAL EXAMINATION:  GENERAL:  The patient is in no distress.  Affect is  appropriate.  HEENT:  Eyelids are unremarkable.  Pupils equal, round,  reactive to light.  Fundi not visualized.  Oral mucosa unremarkable.  NECK:  No jugular venous distention, wave form within normal limits, carotid  upstrokes brisk and symmetric, no bruits or thyromegaly.  LYMPHATICS:  No cervical, axillary, or inguinal adenopathy.  LUNGS:  Clear to auscultation bilaterally.  BACK:  No costovertebral angle tenderness.  CHEST:  Unremarkable.  HEART:  PMI not displaced, just sustained.  S1 and S2 within normal limits.  No S3, S4, no murmurs.  ABDOMEN:  Flat, positive bowel sounds, normal in frequency and pitch.  No  rebound, bruits, no guarding.  No midline pulsatile mass, no hepatomegaly,  no splenomegaly.  SKIN:  No rashes or nodules.  EXTREMITIES:  2+ pulses, no cyanosis, clubbing, or edema.  NEUROLOGIC:  Oriented to person, place, and time.  Cranial nerves II-XII  intact.  Motor grossly normal.   LABORATORY DATA:  EKG:  Sinus bradycardia, rate of 58, diffuse nonspecific T-  wave flattening, borderline low voltage in limb leads, low voltage in the  precordial leads.   Chest x-ray:  Mild diffuse peribronchial thickening, no acute air space  disease.   WBC 4,  hemoglobin 12.6, platelets 191.  Sodium 139, potassium 4, BUN 16,  creatinine 0.9.  D-dimer less than 0.22.  TSH 4.82.  CK 94/1.2, 103/3.1,  94/2.4.  Troponin 0.06, 0.31, 0.28.   ASSESSMENT AND PLAN:  1.  Chest discomfort.  The patient's chest discomfort is very classic for      unstable angina.  She has elevated troponin's.  Therefore, the next step      should be a cardiac catheterization.  The risks and benefits have been      described in detail with the patient.  She has been offered the video to      watch.  She understands and agrees to proceed with the plan.  She is on      aspirin.  Heparin has been started.  She will not tolerate a beta      blocker as her baseline rate is in the 50s.  2.  Risk reduction.  The patient would not want to take a statin given her      sister's history.  We will check a lipid profile.  3.  Hypothyroidism.  She will continue this.  4.  Followup will be based on the results of the catheterization.      JH/MEDQ  D:  06/30/2004  T:  06/30/2004  Job:  [id number]   cc:   Valetta Mole. Swords, M.D. [facility]

## 2010-06-30 NOTE — Discharge Summary (Signed)
[name], OTTERSON                ACCOUNT NO.:  [id number]   MEDICAL RECORD NO.:  [id number]          PATIENT TYPE:  INP   LOCATION:  3741                         FACILITY:  [facility]   PHYSICIAN:  Rene Paci, M.D. LHCDATE OF BIRTH:  November 23, 1950   DATE OF ADMISSION:  06/29/2004  DATE OF DISCHARGE:  07/03/2004                                 DISCHARGE SUMMARY   DISCHARGE DIAGNOSES:  1.  Status post non-Q-wave myocardial infarction.  2.  Dyslipidemia.   HISTORY OF PRESENT ILLNESS:  The patient is a 60 year old female who  presented to the ER on Jun 29, 2004, with complaint of chest pain which had  been intermittent since the day prior to admission.  The patient described  pain as heaviness and felt as if chest was closing.  The patient did  notice positive radiation of pain down her right arm the morning of  admission.  The pain was exacerbated by walking and the patient reported  improvement with nitroglycerin in ER.   PAST MEDICAL HISTORY:  1.  Hypothyroidism.  2.  Old CVA on MRI.  The patient was never aware of stroke.  3.  History of headaches.  4.  Depression.   COURSE OF HOSPITALIZATION:  The patient was admitted for rule out MI.  The  patient was placed on telemetry and the patient underwent serial cardiac  enzymes with troponins as high as 0.31.  Cardiology was consulted secondary  to elevated cardiac enzymes.  The patient underwent cardiac catheterization  on Jun 30, 2004, which was significant for one-vessel CAD with subtotal  occlusion of the first OM.   Per recommendation of cardiology, the patient was started on statin for LDL  equal to 222 and a total cholesterol of 299.  The patient was also started  on low-dose beta blocker, Lopressor 25 mg p.o. twice daily.  The patient  will be maintained as an outpatient on aspirin 325 mg daily, as well as  Plavix 75 mg p.o. daily.  In addition, the patient will be given a  prescription for nitroglycerin p.r.n.   MEDICATIONS AT DISCHARGE:  1.  Synthroid 50 mcg p.o. daily.  2.  Paxil 20 mg p.o. daily.  3.  Aspirin 325 mg p.o. daily.  4.  Plavix 75 mg p.o. daily.  5.  Lipitor 40 mg p.o. daily.  6.  Lopressor 25 mg p.o. twice daily.  7.  Nitroglycerin 0.4 mg sublingual every five minutes x 3 doses p.r.n.      chest pain.   LABORATORIES AT DISCHARGE:  Hemoglobin equal to 11.2, hematocrit equal to  32.6.  BUN 7, creatinine 0.8.   FOLLOWUP:  The patient is to follow up with Bruce H. Swords, M.D., at 11:50  a.m. on Tuesday, Jul 11, 2004.  The patient is also to follow up with Dr.  Antoine Poche on July 13, 2004, at 11:30 a.m.      MSO/MEDQ  D:  07/03/2004  T:  07/03/2004  Job:  [id number]   cc:   Valetta Mole. Swords, M.D. [facility]   Rollene Rotunda, M.D.

## 2010-06-30 NOTE — Assessment & Plan Note (Signed)
[facility]                                 ON-CALL NOTE   BRONTE, SABADO                         MRN:          [id number]  DATE:11/10/2006                            DOB:          1951/01/22    TIME OF CALL:  1:17 p.m.   TELEPHONE NUMBER:  [phone].   ON CALL PROGRESS NOTE:  The caller is Josefa Half. She sees Dr.  __________ . The patient reports nausea and vomiting since last night.  She has been able to keep some fluids down but almost nothing else. She  has Phenergan tablets she has been trying at home but cannot keep them  down. She has no fever, no abdominal pain, and no diarrhea. My response  is to call in Phenergan 25 mg suppositories to use every 4 hours as  needed, #20 with no refills to [facility] at [phone] adn she will followup  with [place] as needed.     Tera Mater. Clent Ridges, MD  Electronically Signed    SAF/MedQ  DD: 11/10/2006  DT: 11/10/2006  Job #: [id number]

## 2010-07-04 ENCOUNTER — Ambulatory Visit (HOSPITAL_COMMUNITY)
Admission: RE | Admit: 2010-07-04 | Discharge: 2010-07-04 | Disposition: A | Discharge status: Home / Self Care | Payer: PRIVATE HEALTH INSURANCE | Source: Ambulatory Visit | Attending: Obstetrics & Gynecology | Admitting: Obstetrics & Gynecology

## 2010-07-04 DIAGNOSIS — Z1231 Encounter for screening mammogram for malignant neoplasm of breast: Secondary | ICD-10-CM | POA: Insufficient documentation

## 2010-08-11 ENCOUNTER — Telehealth: Payer: Self-pay | Admitting: *Deleted

## 2010-08-11 DIAGNOSIS — N959 Unspecified menopausal and perimenopausal disorder: Secondary | ICD-10-CM

## 2010-08-11 NOTE — Telephone Encounter (Signed)
Order added, pt aware

## 2010-08-11 NOTE — Telephone Encounter (Signed)
Pt would like to add a Vitamin D on her labs at [facility] July 5th.

## 2010-08-17 ENCOUNTER — Other Ambulatory Visit (INDEPENDENT_AMBULATORY_CARE_PROVIDER_SITE_OTHER): Payer: PRIVATE HEALTH INSURANCE | Admitting: Internal Medicine

## 2010-08-17 ENCOUNTER — Other Ambulatory Visit: Payer: Self-pay | Admitting: Internal Medicine

## 2010-08-17 ENCOUNTER — Other Ambulatory Visit (INDEPENDENT_AMBULATORY_CARE_PROVIDER_SITE_OTHER): Payer: PRIVATE HEALTH INSURANCE

## 2010-08-17 DIAGNOSIS — Z0000 Encounter for general adult medical examination without abnormal findings: Secondary | ICD-10-CM

## 2010-08-17 DIAGNOSIS — N959 Unspecified menopausal and perimenopausal disorder: Secondary | ICD-10-CM

## 2010-08-17 LAB — URINALYSIS, ROUTINE W REFLEX MICROSCOPIC
Ketones, ur: NEGATIVE
Leukocytes, UA: NEGATIVE
Specific Gravity, Urine: >=1.03 (ref 1.000–1.030)
Urine Glucose: NEGATIVE
Urobilinogen, UA: 0.2 (ref 0.0–1.0)

## 2010-08-17 LAB — LIPID PANEL
Total CHOL/HDL Ratio: 4
VLDL: 13 mg/dL (ref 0.0–40.0)

## 2010-08-17 LAB — CBC WITH DIFFERENTIAL/PLATELET
Basophils Absolute: 0 10*3/uL (ref 0.0–0.1)
Eosinophils Absolute: 0.3 10*3/uL (ref 0.0–0.7)
Eosinophils Relative: 6 % — ABNORMAL HIGH (ref 0.0–5.0)
Hemoglobin: 13.6 g/dL (ref 12.0–15.0)
MCHC: 33.8 g/dL (ref 30.0–36.0)
MCV: 90.6 fl (ref 78.0–100.0)
Neutrophils Relative %: 60.6 % (ref 43.0–77.0)
Platelets: 228 10*3/uL (ref 150.0–400.0)

## 2010-08-17 LAB — HEPATIC FUNCTION PANEL
ALT: 18 U/L (ref 0–35)
AST: 20 U/L (ref 0–37)
Alkaline Phosphatase: 78 U/L (ref 39–117)
Total Bilirubin: 0.5 mg/dL (ref 0.3–1.2)

## 2010-08-17 LAB — BASIC METABOLIC PANEL
BUN: 16 mg/dL (ref 6–23)
CO2: 28 mEq/L (ref 19–32)
Chloride: 104 mEq/L (ref 96–112)
Creatinine, Ser: 0.8 mg/dL (ref 0.4–1.2)
Glucose, Bld: 103 mg/dL — ABNORMAL HIGH (ref 70–99)
Potassium: 3.6 mEq/L (ref 3.5–5.1)

## 2010-08-18 ENCOUNTER — Other Ambulatory Visit: Payer: Self-pay | Admitting: Internal Medicine

## 2010-08-18 LAB — VITAMIN D 25 HYDROXY (VIT D DEFICIENCY, FRACTURES): Vit D, 25-Hydroxy: 27 ng/mL — ABNORMAL LOW (ref 30–89)

## 2010-08-22 ENCOUNTER — Telehealth: Payer: Self-pay | Admitting: *Deleted

## 2010-08-22 MED ORDER — ERGOCALCIFEROL 1.25 MG (50000 UT) PO CAPS: 50000.0000 [IU] | ORAL_CAPSULE | ORAL | Status: DC

## 2010-08-22 NOTE — Telephone Encounter (Signed)
Spoke to pt, and will send Vitamin D to pharmacy.

## 2010-08-23 ENCOUNTER — Encounter: Payer: PRIVATE HEALTH INSURANCE | Admitting: Internal Medicine

## 2010-08-29 ENCOUNTER — Other Ambulatory Visit: Payer: Self-pay | Admitting: *Deleted

## 2010-09-15 ENCOUNTER — Encounter: Payer: Self-pay | Admitting: Internal Medicine

## 2010-09-18 ENCOUNTER — Encounter: Payer: PRIVATE HEALTH INSURANCE | Admitting: Internal Medicine

## 2010-09-19 ENCOUNTER — Encounter: Payer: Self-pay | Admitting: Internal Medicine

## 2010-09-19 ENCOUNTER — Ambulatory Visit (INDEPENDENT_AMBULATORY_CARE_PROVIDER_SITE_OTHER): Payer: PRIVATE HEALTH INSURANCE | Admitting: Internal Medicine

## 2010-09-19 DIAGNOSIS — E785 Hyperlipidemia, unspecified: Secondary | ICD-10-CM

## 2010-09-19 DIAGNOSIS — Z23 Encounter for immunization: Secondary | ICD-10-CM

## 2010-09-19 DIAGNOSIS — Z0000 Encounter for general adult medical examination without abnormal findings: Secondary | ICD-10-CM

## 2010-09-19 MED ORDER — PITAVASTATIN CALCIUM 2 MG PO TABS: 1.0000 mg | ORAL_TABLET | ORAL | Status: DC

## 2010-09-28 NOTE — Progress Notes (Signed)
  Subjective:    Patient ID: [name], female    DOB: 05/23/1950, 60 y.o.   MRN: [id number]  HPI  cpx  Past Medical History  Diagnosis Date  . Depression   . Myocardial infarction   . CAD (coronary artery disease)   . Hyperthyroidism    Past Surgical History  Procedure Date  . Ptca     reports that she has never smoked. She does not have any smokeless tobacco history on file. She reports that she does not drink alcohol or use illicit drugs. family history is not on file. Allergies  Allergen Reactions  . Prochlorperazine     REACTION: nerve reaction  . Simvastatin     REACTION: leg cramps     Review of Systems    patient denies chest pain, shortness of breath, orthopnea. Denies lower extremity edema, abdominal pain, change in appetite, change in bowel movements. Patient denies rashes, musculoskeletal complaints. No other specific complaints in a complete review of systems.    Objective:   Physical Exam   Well-developed well-nourished female in no acute distress. HEENT exam atraumatic, normocephalic, extraocular muscles are intact. Neck is supple. No jugular venous distention no thyromegaly. Chest clear to auscultation without increased work of breathing. Cardiac exam S1 and S2 are regular. Abdominal exam active bowel sounds, soft, nontender. Extremities no edema. Neurologic exam she is alert without any motor sensory deficits. Gait is normal.     Assessment & Plan:  Well visit. Health maintenance is up-to-date.

## 2010-10-11 ENCOUNTER — Telehealth: Payer: Self-pay | Admitting: Internal Medicine

## 2010-10-11 ENCOUNTER — Other Ambulatory Visit: Payer: Self-pay | Admitting: Internal Medicine

## 2010-10-11 MED ORDER — PAROXETINE HCL 20 MG PO TABS: 20.0000 mg | ORAL_TABLET | ORAL | Status: DC

## 2010-10-11 MED ORDER — SYNTHROID 125 MCG PO TABS: 125.0000 ug | ORAL_TABLET | Freq: Every day | ORAL | Status: DC

## 2010-10-11 NOTE — Telephone Encounter (Signed)
rx sent in electronically 

## 2010-10-11 NOTE — Telephone Encounter (Signed)
Pt called this morning and meant to req a refill of Paxil 20 mg #90. Pls call in to [facility] on [address].

## 2010-10-11 NOTE — Telephone Encounter (Signed)
Message on triage line. Pt states that at her most recent physical, you were going to call in a 90-day supply of her Synthroid. She states it has not been called in. Please call in to the [facility] on [address].

## 2010-10-30 ENCOUNTER — Telehealth: Payer: Self-pay | Admitting: Internal Medicine

## 2010-10-30 NOTE — Telephone Encounter (Signed)
Pt would like to have vit d level check. Can I sch?

## 2010-10-31 NOTE — Telephone Encounter (Signed)
yes

## 2010-10-31 NOTE — Telephone Encounter (Signed)
lmom for pt to callback to sch lab ov

## 2010-11-01 ENCOUNTER — Other Ambulatory Visit (INDEPENDENT_AMBULATORY_CARE_PROVIDER_SITE_OTHER): Payer: PRIVATE HEALTH INSURANCE

## 2010-11-01 DIAGNOSIS — E559 Vitamin D deficiency, unspecified: Secondary | ICD-10-CM

## 2010-11-01 NOTE — Telephone Encounter (Signed)
Pt is sch for lab on 9-19/2012 11.30a

## 2010-11-02 NOTE — Progress Notes (Signed)
Quick Note:  Left a message for pt to return. ______ 

## 2010-11-24 ENCOUNTER — Ambulatory Visit: Payer: PRIVATE HEALTH INSURANCE | Admitting: Family Medicine

## 2010-11-24 ENCOUNTER — Telehealth: Payer: Self-pay | Admitting: *Deleted

## 2010-11-24 NOTE — Telephone Encounter (Signed)
Pt is asking for Vitamin D results.

## 2010-11-24 NOTE — Telephone Encounter (Signed)
Pt aware of normal lab results.  

## 2010-12-01 ENCOUNTER — Telehealth: Payer: Self-pay | Admitting: Internal Medicine

## 2010-12-01 ENCOUNTER — Ambulatory Visit (INDEPENDENT_AMBULATORY_CARE_PROVIDER_SITE_OTHER): Payer: BC Managed Care – PPO | Admitting: Internal Medicine

## 2010-12-01 VITALS — BP 142/100 | Temp 98.2°F | Wt 158.0 lb

## 2010-12-01 DIAGNOSIS — M353 Polymyalgia rheumatica: Secondary | ICD-10-CM | POA: Insufficient documentation

## 2010-12-01 LAB — CBC WITH DIFFERENTIAL/PLATELET
Basophils Absolute: 0.1 10*3/uL (ref 0.0–0.1)
Basophils Relative: 1 % (ref 0.0–3.0)
Eosinophils Absolute: 0.3 10*3/uL (ref 0.0–0.7)
Hemoglobin: 12.7 g/dL (ref 12.0–15.0)
MCHC: 33.1 g/dL (ref 30.0–36.0)
MCV: 90.6 fl (ref 78.0–100.0)
Monocytes Absolute: 0.4 10*3/uL (ref 0.1–1.0)
Neutro Abs: 4.9 10*3/uL (ref 1.4–7.7)
Neutrophils Relative %: 73.1 % (ref 43.0–77.0)
RBC: 4.23 Mil/uL (ref 3.87–5.11)
RDW: 13.9 % (ref 11.5–14.6)

## 2010-12-01 LAB — POCT URINALYSIS DIPSTICK
Ketones, UA: NEGATIVE
Protein, UA: NEGATIVE
Spec Grav, UA: 1.02
Urobilinogen, UA: 0.2
pH, UA: 6

## 2010-12-01 MED ORDER — PREDNISONE (PAK) 10 MG PO TABS: 20.0000 mg | ORAL_TABLET | Freq: Every day | ORAL | Status: DC

## 2010-12-01 MED ORDER — HYDROCODONE-ACETAMINOPHEN 5-325 MG PO TABS: 1.0000 | ORAL_TABLET | Freq: Three times a day (TID) | ORAL | Status: AC | PRN

## 2010-12-01 NOTE — Progress Notes (Signed)
  Subjective:    Patient ID: [name], female    DOB: 12-Feb-1951, 60 y.o.   MRN: [id number]  HPI  6 week hx of diffuse pain. Neck , shoulders, hips, knees, thighs. Very slow in the morning because of diffuse pain. She hurts constantly. Advil - 12 daily with some relief. She was concerned about what ibuprofen might be doing to kidneys....states that she dipped urine at work---4+ blood. No rashes, no joint swelling.  She has not been able to tolerate statins in the past---only took two days of livalo several months ago  Past Medical History  Diagnosis Date  . Depression   . Myocardial infarction   . CAD (coronary artery disease)   . Hyperthyroidism    Past Surgical History  Procedure Date  . Ptca     reports that she has never smoked. She does not have any smokeless tobacco history on file. She reports that she does not drink alcohol or use illicit drugs. family history is not on file. Allergies  Allergen Reactions  . Prochlorperazine     REACTION: nerve reaction  . Simvastatin     REACTION: leg cramps     Review of Systems  patient denies chest pain, shortness of breath, orthopnea. Denies lower extremity edema, abdominal pain, change in appetite, change in bowel movements. Patient denies rashes, musculoskeletal complaints. No other specific complaints in a complete review of systems.      Objective:   Physical Exam  Well-developed well-nourished female in no acute distress. She appears uncomfortable while walking.  HEENT exam atraumatic, normocephalic, extraocular muscles are intact. Neck is supple. No jugular venous distention no thyromegaly. Chest clear to auscultation without increased work of breathing. Cardiac exam S1 and S2 are regular. Abdominal exam active bowel sounds, soft, nontender. Extremities no edema. Neurologic exam she is alert without any motor sensory deficits. Gait is normal.        Assessment & Plan:

## 2010-12-01 NOTE — Assessment & Plan Note (Signed)
emperic diagnosis Check labs today--- Start prednisone

## 2010-12-01 NOTE — Telephone Encounter (Signed)
Please call her when labs are available on Monday at the number listed in this message and her extension is [phone]. Thanks.

## 2010-12-05 NOTE — Telephone Encounter (Signed)
Pt aware of lab results 

## 2010-12-11 ENCOUNTER — Ambulatory Visit (INDEPENDENT_AMBULATORY_CARE_PROVIDER_SITE_OTHER): Payer: BC Managed Care – PPO | Admitting: Internal Medicine

## 2010-12-11 DIAGNOSIS — M353 Polymyalgia rheumatica: Secondary | ICD-10-CM

## 2010-12-11 MED ORDER — PAROXETINE HCL 20 MG PO TABS: 20.0000 mg | ORAL_TABLET | ORAL | Status: DC

## 2010-12-11 MED ORDER — PREDNISONE (PAK) 10 MG PO TABS: ORAL_TABLET | ORAL | Status: DC

## 2010-12-11 NOTE — Assessment & Plan Note (Signed)
Reviewed last ESR Taper prednisone to 15 mg---see instructions.

## 2010-12-11 NOTE — Patient Instructions (Signed)
See new prednisone instructions

## 2010-12-11 NOTE — Progress Notes (Signed)
  Subjective:    Patient ID: [name], female    DOB: [birth year], 60 y.o.   MRN: [id number]  HPI  F/u PMR.  She feels much better: back to normal. On prednisone 20 mg po qd Reviewed previous notes and labs  Past Medical History  Diagnosis Date  . Depression   . Myocardial infarction   . CAD (coronary artery disease)   . Hyperthyroidism    Past Surgical History  Procedure Date  . Ptca     reports that she has never smoked. She does not have any smokeless tobacco history on file. She reports that she does not drink alcohol or use illicit drugs. family history is not on file. Allergies  Allergen Reactions  . Prochlorperazine     REACTION: nerve reaction  . Simvastatin     REACTION: leg cramps     Review of Systems  patient denies chest pain, shortness of breath, orthopnea. Denies lower extremity edema, abdominal pain, change in appetite, change in bowel movements. Patient denies rashes, musculoskeletal complaints. No other specific complaints in a complete review of systems.      Objective:   Physical Exam   Well-developed well-nourished female in no acute distress. HEENT exam atraumatic, normocephalic, extraocular muscles are intact. Neck is supple. No jugular venous distention no thyromegaly. Chest clear to auscultation without increased work of breathing. Cardiac exam S1 and S2 are regular. Abdominal exam active bowel sounds, soft, nontender. Extremities no edema.      Assessment & Plan:

## 2010-12-18 ENCOUNTER — Other Ambulatory Visit: Payer: Self-pay | Admitting: Internal Medicine

## 2011-01-18 ENCOUNTER — Telehealth: Payer: Self-pay | Admitting: *Deleted

## 2011-01-18 NOTE — Telephone Encounter (Signed)
Pt has an appt on 12/11 and wants to postpone it until Jan. Is this okay? She is doing fine. She would also like a refill on her prednisone.

## 2011-01-19 NOTE — Telephone Encounter (Signed)
See me one month but she needs to decrease prednisone She is currently taking 15 mg prednisone daily. Decrease to 10 mg daily for 3 weeks and then decrease to 10 mg po qod alternating with 5 mg( 1/2 of 10 mg tablet) po qod .

## 2011-01-19 NOTE — Telephone Encounter (Signed)
Pt aware.

## 2011-01-23 ENCOUNTER — Ambulatory Visit: Payer: BC Managed Care – PPO | Admitting: Internal Medicine

## 2011-01-24 ENCOUNTER — Telehealth: Payer: Self-pay

## 2011-01-24 NOTE — Telephone Encounter (Signed)
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  Pt states she was tested for flu and the test was negative.  Pt cannot come in for an office visit but would like a z-pak called in to [facility] [address]. Pls advise.

## 2011-01-25 NOTE — Telephone Encounter (Signed)
No need for zpack Start mucinex dm bid for 7 days

## 2011-01-26 NOTE — Telephone Encounter (Signed)
Pt aware.

## 2011-03-02 ENCOUNTER — Ambulatory Visit: Payer: BC Managed Care – PPO | Admitting: Internal Medicine

## 2011-03-13 ENCOUNTER — Ambulatory Visit: Payer: BC Managed Care – PPO | Admitting: Internal Medicine

## 2011-03-21 ENCOUNTER — Ambulatory Visit (INDEPENDENT_AMBULATORY_CARE_PROVIDER_SITE_OTHER): Payer: BC Managed Care – PPO | Admitting: Internal Medicine

## 2011-03-21 DIAGNOSIS — M353 Polymyalgia rheumatica: Secondary | ICD-10-CM

## 2011-03-21 MED ORDER — PREDNISONE 2 MG PO TBEC: 2.0000 mg | DELAYED_RELEASE_TABLET | ORAL | Status: DC

## 2011-03-21 NOTE — Assessment & Plan Note (Signed)
Has had extraordinary response to prednisone. Slow taper. Will decrease prednisone 1 mg each month.

## 2011-03-21 NOTE — Patient Instructions (Signed)
Taper prednisone by 1mg  each month

## 2011-03-21 NOTE — Progress Notes (Signed)
Patient ID: [name], female   DOB: [birth year], 61 y.o.   MRN: [id number] PMR---"i'm cured" She is feeling well without any pain No joint swelling, no rash, no headache  Past Medical History  Diagnosis Date  . Depression   . Myocardial infarction   . CAD (coronary artery disease)   . Hyperthyroidism     History   Social History  . Marital Status: Married    Spouse Name: N/A    Number of Children: N/A  . Years of Education: N/A   Occupational History  . Not on file.   Social History Main Topics  . Smoking status: Never Smoker   . Smokeless tobacco: Not on file  . Alcohol Use: No  . Drug Use: No  . Sexually Active: Not on file   Other Topics Concern  . Not on file   Social History Narrative  . No narrative on file    Past Surgical History  Procedure Date  . Ptca     No family history on file.  Allergies  Allergen Reactions  . Prochlorperazine     REACTION: nerve reaction  . Simvastatin     REACTION: leg cramps    Current Outpatient Prescriptions on File Prior to Visit  Medication Sig Dispense Refill  . aspirin 81 MG EC tablet Take 81 mg by mouth daily.        Marland Kitchen PARoxetine (PAXIL) 20 MG tablet Take 1 tablet (20 mg total) by mouth every morning.  90 tablet  3  . Pitavastatin Calcium 2 MG TABS Take 0.5 tablets (1 mg total) by mouth every other day.  30 tablet    . predniSONE (STERAPRED UNI-PAK) 10 MG tablet 1 and 1/2 pills daily for 2 weeks and then 1 pill daily.  60 tablet  1  . SYNTHROID 125 MCG tablet Take 1 tablet (125 mcg total) by mouth daily.  30 each  5     patient denies chest pain, shortness of breath, orthopnea. Denies lower extremity edema, abdominal pain, change in appetite, change in bowel movements. Patient denies rashes, musculoskeletal complaints. No other specific complaints in a complete review of systems.   BP 142/94  Temp(Src) 97.6 F (36.4 C) (Oral)  Wt 163 lb (73.936 kg)  Well-developed well-nourished female in no acute  distress. HEENT exam atraumatic, normocephalic, extraocular muscles are intact. Neck is supple. No jugular venous distention no thyromegaly. Chest clear to auscultation without increased work of breathing. Cardiac exam S1 and S2 are regular. Abdominal exam active bowel sounds, soft, nontender.

## 2011-04-07 ENCOUNTER — Other Ambulatory Visit: Payer: Self-pay | Admitting: Internal Medicine

## 2011-04-12 ENCOUNTER — Other Ambulatory Visit: Payer: Self-pay | Admitting: Internal Medicine

## 2011-04-19 ENCOUNTER — Other Ambulatory Visit: Payer: Self-pay | Admitting: Internal Medicine

## 2011-05-14 ENCOUNTER — Other Ambulatory Visit: Payer: Self-pay | Admitting: Internal Medicine

## 2011-06-29 ENCOUNTER — Ambulatory Visit: Payer: BC Managed Care – PPO | Admitting: Family Medicine

## 2011-06-29 DIAGNOSIS — Z0289 Encounter for other administrative examinations: Secondary | ICD-10-CM

## 2011-07-03 ENCOUNTER — Other Ambulatory Visit: Payer: Self-pay | Admitting: Internal Medicine

## 2011-08-21 ENCOUNTER — Ambulatory Visit: Payer: BC Managed Care – PPO | Admitting: Internal Medicine

## 2011-08-21 ENCOUNTER — Encounter: Payer: Self-pay | Admitting: Internal Medicine

## 2011-08-21 ENCOUNTER — Ambulatory Visit (INDEPENDENT_AMBULATORY_CARE_PROVIDER_SITE_OTHER): Payer: BC Managed Care – PPO | Admitting: Internal Medicine

## 2011-08-21 VITALS — BP 140/92 | Temp 98.2°F | Wt 162.0 lb

## 2011-08-21 DIAGNOSIS — M353 Polymyalgia rheumatica: Secondary | ICD-10-CM

## 2011-08-21 MED ORDER — PREDNISONE 5 MG PO TABS: 20.0000 mg | ORAL_TABLET | Freq: Every day | ORAL | Status: DC

## 2011-08-21 NOTE — Addendum Note (Signed)
Addended by: Lindley Magnus on: 08/21/2011 09:29 AM   Modules accepted: Orders

## 2011-08-21 NOTE — Progress Notes (Signed)
Patient ID: SU DUMA, female   DOB: Jul 29, 1950, 61 y.o.   MRN: [id number]  Patient self dcd prednisone. She has PMR. Sxs have recurred: leg pain, neck pain.  sxs progressive for several weeks (since stopping prednisone)  Past Medical History  Diagnosis Date  . Depression   . Myocardial infarction   . CAD (coronary artery disease)   . Hyperthyroidism     History   Social History  . Marital Status: Married    Spouse Name: N/A    Number of Children: N/A  . Years of Education: N/A   Occupational History  . Not on file.   Social History Main Topics  . Smoking status: Never Smoker   . Smokeless tobacco: Not on file  . Alcohol Use: No  . Drug Use: No  . Sexually Active: Not on file   Other Topics Concern  . Not on file   Social History Narrative  . No narrative on file    Past Surgical History  Procedure Date  . Ptca     No family history on file.  Allergies  Allergen Reactions  . Prochlorperazine     REACTION: nerve reaction  . Simvastatin     REACTION: leg cramps    Current Outpatient Prescriptions on File Prior to Visit  Medication Sig Dispense Refill  . aspirin 81 MG EC tablet Take 81 mg by mouth daily.        Marland Kitchen PARoxetine (PAXIL) 20 MG tablet TAKE  ONE TABLET BY MOUTH EVERY MORNING  30 tablet  5  . SYNTHROID 125 MCG tablet Take 1 tablet (125 mcg total) by mouth daily.  30 each  5  . DISCONTD: SYNTHROID 125 MCG tablet TAKE ONE TABLET BY MOUTH ONE TIME DAILY  30 each  1  . DISCONTD: SYNTHROID 125 MCG tablet TAKE ONE TABLET BY MOUTH ONE TIME DAILY  30 each  5     patient denies chest pain, shortness of breath, orthopnea. Denies lower extremity edema, abdominal pain, change in appetite, change in bowel movements. Patient denies rashes, musculoskeletal complaints. No other specific complaints in a complete review of systems.   BP 140/92  Temp 98.2 F (36.8 C) (Oral)  Wt 162 lb (73.483 kg)  well-developed well-nourished female in no acute distress. HEENT  exam atraumatic, normocephalic, neck supple without jugular venous distention. Chest clear to auscultation cardiac exam S1-S2 are regular. Abdominal exam overweight with bowel sounds, soft and nontender. Extremities no edema. Neurologic exam is alert with a normal gait.

## 2011-08-21 NOTE — Assessment & Plan Note (Signed)
She needs to resume prednisone Start at 20 mg po qd.  See me next week.

## 2011-08-22 LAB — VITAMIN D 25 HYDROXY (VIT D DEFICIENCY, FRACTURES): Vit D, 25-Hydroxy: 22 ng/mL — ABNORMAL LOW (ref 30–89)

## 2011-08-23 ENCOUNTER — Other Ambulatory Visit: Payer: Self-pay | Admitting: *Deleted

## 2011-08-23 MED ORDER — ERGOCALCIFEROL 1.25 MG (50000 UT) PO CAPS: 50000.0000 [IU] | ORAL_CAPSULE | ORAL | Status: AC

## 2011-09-03 ENCOUNTER — Ambulatory Visit (INDEPENDENT_AMBULATORY_CARE_PROVIDER_SITE_OTHER): Payer: BC Managed Care – PPO | Admitting: Internal Medicine

## 2011-09-03 ENCOUNTER — Encounter: Payer: Self-pay | Admitting: Internal Medicine

## 2011-09-03 VITALS — BP 130/86 | Temp 98.1°F | Wt 164.0 lb

## 2011-09-03 DIAGNOSIS — M353 Polymyalgia rheumatica: Secondary | ICD-10-CM

## 2011-09-03 MED ORDER — ALENDRONATE SODIUM 70 MG PO TABS: 70.0000 mg | ORAL_TABLET | ORAL | Status: AC

## 2011-09-03 MED ORDER — PREDNISONE 5 MG PO TABS: 15.0000 mg | ORAL_TABLET | Freq: Every day | ORAL | Status: DC

## 2011-09-03 NOTE — Progress Notes (Signed)
  Subjective:    Patient ID: [name], female    DOB: [birth year], 61 y.o.   MRN: [id number]  HPI pmr-- There is some confusion about how much prednisone she is taking. She is supposed to be taking 5 mg tabs 2 daily. But she is taking 5 daily!.  She feels great and has no complaints Past Medical History  Diagnosis Date  . Depression   . Myocardial infarction   . CAD (coronary artery disease)   . Hyperthyroidism     History   Social History  . Marital Status: Married    Spouse Name: N/A    Number of Children: N/A  . Years of Education: N/A   Occupational History  . Not on file.   Social History Main Topics  . Smoking status: Never Smoker   . Smokeless tobacco: Not on file  . Alcohol Use: No  . Drug Use: No  . Sexually Active: Not on file   Other Topics Concern  . Not on file   Social History Narrative  . No narrative on file    Past Surgical History  Procedure Date  . Ptca     No family history on file.  Allergies  Allergen Reactions  . Prochlorperazine     REACTION: nerve reaction  . Simvastatin     REACTION: leg cramps    Current Outpatient Prescriptions on File Prior to Visit  Medication Sig Dispense Refill  . aspirin 81 MG EC tablet Take 81 mg by mouth daily.        . ergocalciferol (VITAMIN D2) 50000 UNITS capsule Take 1 capsule (50,000 Units total) by mouth once a week.  12 capsule  0  . PARoxetine (PAXIL) 20 MG tablet TAKE  ONE TABLET BY MOUTH EVERY MORNING  30 tablet  5  . SYNTHROID 125 MCG tablet Take 1 tablet (125 mcg total) by mouth daily.  30 each  5     patient denies chest pain, shortness of breath, orthopnea. Denies lower extremity edema, abdominal pain, change in appetite, change in bowel movements. Patient denies rashes, musculoskeletal complaints. No other specific complaints in a complete review of systems.   BP 146/100  Temp 98.1 F (36.7 C) (Oral)  Wt 164 lb (74.39 kg)     Review of Systems     Objective:   Physical Exam  Repeat blood pressure 138/86. She appears somewhat cushingoid. Chest clear to auscultation cardiac exam S1 and S2 are regular. Musculoskeletal exam without any tenosynovitis or joint effusions.      Assessment & Plan:

## 2011-09-03 NOTE — Patient Instructions (Signed)
Prednisone 15 mg for 2 weeks and then 10 mg

## 2011-09-04 NOTE — Assessment & Plan Note (Addendum)
Patient is feeling well. She has no complaints. On examination she is a somewhat cushingoid. I will decrease prednisone. She inadvertently has been taking 25 mg of prednisone daily. I'll decrease it to 15 mg now. My goal will be to get her to 10 mg over the next few weeks. We will then start a very slow taper.  Given her use of steroids and her presumed use of continued steroids I will start her on a bisphosphonate.

## 2011-09-26 ENCOUNTER — Encounter: Payer: Self-pay | Admitting: Internal Medicine

## 2011-09-26 ENCOUNTER — Ambulatory Visit (INDEPENDENT_AMBULATORY_CARE_PROVIDER_SITE_OTHER): Payer: BC Managed Care – PPO | Admitting: Internal Medicine

## 2011-09-26 VITALS — BP 154/90 | HR 72 | Temp 97.5°F | Wt 166.0 lb

## 2011-09-26 DIAGNOSIS — M353 Polymyalgia rheumatica: Secondary | ICD-10-CM

## 2011-09-26 MED ORDER — PREDNISONE 5 MG PO TABS: ORAL_TABLET | ORAL | Status: DC

## 2011-09-26 NOTE — Patient Instructions (Signed)
You have 5mg  tablets of prednisone. Take 3 of these pills through 9/17. On 9/18 start taking 2 of these tablets daily On 10/1 start taking 1 and 1/2 tablets daily.  See me 5-6 weeks

## 2011-09-26 NOTE — Progress Notes (Signed)
  Subjective:    Patient ID: [name], female    DOB: 05-Feb-1951, 61 y.o.   MRN: [id number]  HPI  Still feels well- taking more prednisone than instructed  Review of Systems     Objective:   Physical Exam        Assessment & Plan:

## 2011-09-29 NOTE — Assessment & Plan Note (Signed)
Discussed at length i have given VERY specific instructions on prednisone She and husband understand  Total time 15 minutes

## 2011-10-15 ENCOUNTER — Other Ambulatory Visit: Payer: Self-pay | Admitting: Internal Medicine

## 2011-11-07 ENCOUNTER — Ambulatory Visit: Payer: BC Managed Care – PPO | Admitting: Internal Medicine

## 2011-11-09 ENCOUNTER — Ambulatory Visit (INDEPENDENT_AMBULATORY_CARE_PROVIDER_SITE_OTHER): Payer: BC Managed Care – PPO | Admitting: Internal Medicine

## 2011-11-09 ENCOUNTER — Encounter: Payer: Self-pay | Admitting: Internal Medicine

## 2011-11-09 VITALS — BP 136/90 | HR 76 | Temp 97.7°F | Wt 167.0 lb

## 2011-11-09 DIAGNOSIS — E538 Deficiency of other specified B group vitamins: Secondary | ICD-10-CM

## 2011-11-09 DIAGNOSIS — M353 Polymyalgia rheumatica: Secondary | ICD-10-CM

## 2011-11-09 MED ORDER — CYANOCOBALAMIN 1000 MCG/ML IJ SOLN
1000.0000 ug | Freq: Once | INTRAMUSCULAR | Status: AC
  Administered 2011-11-09: 1000 ug via INTRAMUSCULAR

## 2011-11-09 NOTE — Assessment & Plan Note (Signed)
Decrease prednsione- see pt instructions

## 2011-11-09 NOTE — Progress Notes (Signed)
  Patient ID: [name], female   DOB: 1950-08-18, 61 y.o.   MRN: [id number]  PMR-- we have had real trouble having her understand the right dose of prednisone. She took way too much to begin with but we are now finally on the "right track". She is taking 7.5 mg prednisone daily currently.. She feels well  Reviewed pmh, sh, meds, soc HX.  ROS-- no complains  Exam-- cushingoid. Wt Readings from Last 3 Encounters:  11/09/11 167 lb (75.751 kg)  09/26/11 166 lb (75.297 kg)  09/03/11 164 lb (74.39 kg)   No rash , no joint effusion

## 2011-11-09 NOTE — Patient Instructions (Signed)
September 29 th- start taking 5 mg (1 tablet) daily  October 20th- start alternating 1 tablet with 1/2 tablet every other day.   See me in 6 weeks

## 2011-11-09 NOTE — Addendum Note (Signed)
Addended by: Alfred Levins D on: 11/09/2011 10:47 AM   Modules accepted: Orders

## 2011-11-10 ENCOUNTER — Other Ambulatory Visit: Payer: Self-pay | Admitting: Internal Medicine

## 2011-11-13 ENCOUNTER — Telehealth: Payer: Self-pay | Admitting: Cardiology

## 2011-11-13 NOTE — Telephone Encounter (Signed)
Pt calling stating she is having chest pressure" it's not really chest pain"---advised her to go to ED--pt states she does not want to go to ED and wants to see dr hochrein--advised she may come in oct 3 at 11:15 am --pt agrees

## 2011-11-13 NOTE — Telephone Encounter (Signed)
Pt having chest pressure time two days wants appt tomorrow or thursday, denies SOB and chest pain, pls call  [phone]

## 2011-11-15 ENCOUNTER — Ambulatory Visit: Payer: BC Managed Care – PPO | Admitting: Cardiology

## 2011-12-12 ENCOUNTER — Ambulatory Visit: Payer: BC Managed Care – PPO | Admitting: Family Medicine

## 2012-02-18 ENCOUNTER — Other Ambulatory Visit: Payer: Self-pay | Admitting: Internal Medicine

## 2012-02-23 ENCOUNTER — Observation Stay (HOSPITAL_COMMUNITY): Payer: No Typology Code available for payment source

## 2012-02-23 ENCOUNTER — Encounter (HOSPITAL_COMMUNITY): Payer: Self-pay | Admitting: *Deleted

## 2012-02-23 ENCOUNTER — Observation Stay (HOSPITAL_COMMUNITY)
Admission: EM | Admit: 2012-02-23 | Discharge: 2012-02-25 | DRG: 392 | Disposition: A | Discharge status: Home Health | Payer: No Typology Code available for payment source | Attending: Internal Medicine | Admitting: Internal Medicine

## 2012-02-23 DIAGNOSIS — M353 Polymyalgia rheumatica: Secondary | ICD-10-CM | POA: Insufficient documentation

## 2012-02-23 DIAGNOSIS — F3289 Other specified depressive episodes: Secondary | ICD-10-CM

## 2012-02-23 DIAGNOSIS — F329 Major depressive disorder, single episode, unspecified: Secondary | ICD-10-CM

## 2012-02-23 DIAGNOSIS — J069 Acute upper respiratory infection, unspecified: Secondary | ICD-10-CM

## 2012-02-23 DIAGNOSIS — E860 Dehydration: Secondary | ICD-10-CM

## 2012-02-23 DIAGNOSIS — K529 Noninfective gastroenteritis and colitis, unspecified: Secondary | ICD-10-CM | POA: Diagnosis present

## 2012-02-23 DIAGNOSIS — I252 Old myocardial infarction: Secondary | ICD-10-CM

## 2012-02-23 DIAGNOSIS — R509 Fever, unspecified: Secondary | ICD-10-CM

## 2012-02-23 DIAGNOSIS — Z79899 Other long term (current) drug therapy: Secondary | ICD-10-CM | POA: Insufficient documentation

## 2012-02-23 DIAGNOSIS — N959 Unspecified menopausal and perimenopausal disorder: Secondary | ICD-10-CM

## 2012-02-23 DIAGNOSIS — K5289 Other specified noninfective gastroenteritis and colitis: Principal | ICD-10-CM

## 2012-02-23 DIAGNOSIS — R319 Hematuria, unspecified: Secondary | ICD-10-CM

## 2012-02-23 DIAGNOSIS — E039 Hypothyroidism, unspecified: Secondary | ICD-10-CM | POA: Insufficient documentation

## 2012-02-23 DIAGNOSIS — I2510 Atherosclerotic heart disease of native coronary artery without angina pectoris: Secondary | ICD-10-CM

## 2012-02-23 LAB — CBC WITH DIFFERENTIAL/PLATELET
Basophils Absolute: 0 10*3/uL (ref 0.0–0.1)
Basophils Relative: 0 % (ref 0–1)
Hemoglobin: 14.7 g/dL (ref 12.0–15.0)
MCHC: 32.7 g/dL (ref 30.0–36.0)
Neutro Abs: 9.5 10*3/uL — ABNORMAL HIGH (ref 1.7–7.7)
Neutrophils Relative %: 90 % — ABNORMAL HIGH (ref 43–77)
RDW: 14.1 % (ref 11.5–15.5)

## 2012-02-23 LAB — COMPREHENSIVE METABOLIC PANEL
AST: 22 U/L (ref 0–37)
Albumin: 3.7 g/dL (ref 3.5–5.2)
Alkaline Phosphatase: 84 U/L (ref 39–117)
Chloride: 99 mEq/L (ref 96–112)
Potassium: 4.1 mEq/L (ref 3.5–5.1)
Sodium: 139 mEq/L (ref 135–145)
Total Bilirubin: 0.6 mg/dL (ref 0.3–1.2)

## 2012-02-23 LAB — URINALYSIS, MICROSCOPIC ONLY
Glucose, UA: NEGATIVE mg/dL
Ketones, ur: NEGATIVE mg/dL
Protein, ur: NEGATIVE mg/dL

## 2012-02-23 LAB — CREATININE, SERUM
Creatinine, Ser: 1.01 mg/dL (ref 0.50–1.10)
GFR calc non Af Amer: 58 mL/min — ABNORMAL LOW (ref >90)

## 2012-02-23 LAB — CBC
Hemoglobin: 12.8 g/dL (ref 12.0–15.0)
RBC: 4.34 MIL/uL (ref 3.87–5.11)

## 2012-02-23 LAB — HEMOGLOBIN A1C: Hgb A1c MFr Bld: 6.1 % — ABNORMAL HIGH (ref <5.7)

## 2012-02-23 MED ORDER — GUAIFENESIN ER 600 MG PO TB12
1200.0000 mg | ORAL_TABLET | Freq: Two times a day (BID) | ORAL | Status: DC
  Administered 2012-02-24 – 2012-02-25 (×3): 1200 mg via ORAL
  Filled 2012-02-23 (×5): qty 2

## 2012-02-23 MED ORDER — SODIUM CHLORIDE 0.9 % IV SOLN
INTRAVENOUS | Status: AC
  Administered 2012-02-23: 125 mL/h via INTRAVENOUS
  Administered 2012-02-24: 20 mL/h via INTRAVENOUS
  Administered 2012-02-24: 125 mL/h via INTRAVENOUS

## 2012-02-23 MED ORDER — PROMETHAZINE HCL 25 MG/ML IJ SOLN: 25.0000 mg | Freq: Once | INTRAMUSCULAR | Status: DC

## 2012-02-23 MED ORDER — HYDROCODONE-ACETAMINOPHEN 5-325 MG PO TABS
1.0000 | ORAL_TABLET | ORAL | Status: DC | PRN
  Administered 2012-02-24: 2 via ORAL
  Filled 2012-02-23: qty 2

## 2012-02-23 MED ORDER — ONDANSETRON HCL 4 MG/2ML IJ SOLN: 4.0000 mg | Freq: Once | INTRAMUSCULAR | Status: DC

## 2012-02-23 MED ORDER — PAROXETINE HCL 20 MG PO TABS
20.0000 mg | ORAL_TABLET | Freq: Every day | ORAL | Status: DC
Start: 2012-02-23 — End: 2012-02-25
  Administered 2012-02-24 – 2012-02-25 (×2): 20 mg via ORAL
  Filled 2012-02-23 (×3): qty 1

## 2012-02-23 MED ORDER — ALBUTEROL SULFATE (5 MG/ML) 0.5% IN NEBU: 2.5000 mg | INHALATION_SOLUTION | RESPIRATORY_TRACT | Status: DC | PRN

## 2012-02-23 MED ORDER — SODIUM CHLORIDE 0.9 % IV SOLN
INTRAVENOUS | Status: AC
  Administered 2012-02-23: 13:00:00 via INTRAVENOUS

## 2012-02-23 MED ORDER — ALBUTEROL SULFATE (5 MG/ML) 0.5% IN NEBU
2.5000 mg | INHALATION_SOLUTION | Freq: Four times a day (QID) | RESPIRATORY_TRACT | Status: DC
  Administered 2012-02-23 – 2012-02-24 (×5): 2.5 mg via RESPIRATORY_TRACT
  Filled 2012-02-23 (×6): qty 0.5

## 2012-02-23 MED ORDER — OSELTAMIVIR PHOSPHATE 75 MG PO CAPS
75.0000 mg | ORAL_CAPSULE | Freq: Two times a day (BID) | ORAL | Status: DC
  Administered 2012-02-24: 75 mg via ORAL
  Filled 2012-02-23 (×4): qty 1

## 2012-02-23 MED ORDER — ACETAMINOPHEN 325 MG PO TABS
650.0000 mg | ORAL_TABLET | Freq: Four times a day (QID) | ORAL | Status: DC | PRN
  Administered 2012-02-24: 650 mg via ORAL

## 2012-02-23 MED ORDER — ASPIRIN EC 81 MG PO TBEC
81.0000 mg | DELAYED_RELEASE_TABLET | Freq: Every day | ORAL | Status: DC
  Administered 2012-02-24 – 2012-02-25 (×2): 81 mg via ORAL
  Filled 2012-02-23 (×3): qty 1

## 2012-02-23 MED ORDER — ACETAMINOPHEN 650 MG RE SUPP: 650.0000 mg | Freq: Four times a day (QID) | RECTAL | Status: DC | PRN

## 2012-02-23 MED ORDER — ONDANSETRON HCL 4 MG/2ML IJ SOLN
4.0000 mg | Freq: Once | INTRAMUSCULAR | Status: AC
  Administered 2012-02-23: 4 mg via INTRAVENOUS
  Filled 2012-02-23: qty 2

## 2012-02-23 MED ORDER — LEVOFLOXACIN IN D5W 500 MG/100ML IV SOLN
500.0000 mg | INTRAVENOUS | Status: DC
  Administered 2012-02-23 – 2012-02-24 (×2): 500 mg via INTRAVENOUS
  Filled 2012-02-23 (×3): qty 100

## 2012-02-23 MED ORDER — PROMETHAZINE HCL 25 MG/ML IJ SOLN
25.0000 mg | Freq: Once | INTRAMUSCULAR | Status: AC
  Administered 2012-02-23: 25 mg via INTRAVENOUS
  Filled 2012-02-23: qty 1

## 2012-02-23 MED ORDER — GUAIFENESIN-DM 100-10 MG/5ML PO SYRP
5.0000 mL | ORAL_SOLUTION | ORAL | Status: DC | PRN
  Filled 2012-02-23: qty 5

## 2012-02-23 MED ORDER — SODIUM CHLORIDE 0.9 % IV SOLN
1000.0000 mL | Freq: Once | INTRAVENOUS | Status: AC
  Administered 2012-02-23: 1000 mL via INTRAVENOUS

## 2012-02-23 MED ORDER — PREDNISONE 5 MG PO TABS
5.0000 mg | ORAL_TABLET | Freq: Every day | ORAL | Status: DC
  Administered 2012-02-24 – 2012-02-25 (×2): 5 mg via ORAL
  Filled 2012-02-23 (×3): qty 1

## 2012-02-23 MED ORDER — ONDANSETRON HCL 4 MG PO TABS: 4.0000 mg | ORAL_TABLET | Freq: Four times a day (QID) | ORAL | Status: DC | PRN

## 2012-02-23 MED ORDER — ASPIRIN 81 MG PO TBEC
81.0000 mg | DELAYED_RELEASE_TABLET | Freq: Every day | ORAL | Status: DC
Start: 2012-02-23 — End: 2012-02-23

## 2012-02-23 MED ORDER — ONDANSETRON HCL 4 MG/2ML IJ SOLN
4.0000 mg | Freq: Four times a day (QID) | INTRAMUSCULAR | Status: DC | PRN
  Administered 2012-02-24: 4 mg via INTRAVENOUS
  Filled 2012-02-23: qty 2

## 2012-02-23 MED ORDER — LEVOTHYROXINE SODIUM 125 MCG PO TABS
125.0000 ug | ORAL_TABLET | Freq: Every day | ORAL | Status: DC
  Administered 2012-02-24 – 2012-02-25 (×2): 125 ug via ORAL
  Filled 2012-02-23 (×3): qty 1

## 2012-02-23 MED ORDER — IPRATROPIUM BROMIDE 0.02 % IN SOLN
0.5000 mg | Freq: Four times a day (QID) | RESPIRATORY_TRACT | Status: DC
  Administered 2012-02-23 – 2012-02-24 (×5): 0.5 mg via RESPIRATORY_TRACT
  Filled 2012-02-23 (×6): qty 2.5

## 2012-02-23 MED ORDER — HEPARIN SODIUM (PORCINE) 5000 UNIT/ML IJ SOLN
5000.0000 [IU] | Freq: Three times a day (TID) | INTRAMUSCULAR | Status: DC
  Administered 2012-02-23 – 2012-02-25 (×6): 5000 [IU] via SUBCUTANEOUS
  Filled 2012-02-23 (×9): qty 1

## 2012-02-23 MED ORDER — ONDANSETRON HCL 4 MG/2ML IJ SOLN: 4.0000 mg | Freq: Three times a day (TID) | INTRAMUSCULAR | Status: DC | PRN

## 2012-02-23 MED ORDER — PROMETHAZINE HCL 25 MG/ML IJ SOLN: 12.5000 mg | Freq: Four times a day (QID) | INTRAMUSCULAR | Status: DC | PRN

## 2012-02-23 MED ORDER — PROMETHAZINE HCL 25 MG/ML IJ SOLN
25.0000 mg | INTRAMUSCULAR | Status: DC | PRN
  Administered 2012-02-23: 25 mg via INTRAVENOUS
  Filled 2012-02-23 (×3): qty 1

## 2012-02-23 MED ORDER — SODIUM CHLORIDE 0.9 % IV BOLUS (SEPSIS)
1000.0000 mL | Freq: Once | INTRAVENOUS | Status: AC
  Administered 2012-02-23: 1000 mL via INTRAVENOUS

## 2012-02-23 NOTE — ED Notes (Signed)
Pt states she has vomited "100 times"  States that zofran never helps her nausea and that she needs some phenergan.  Pt states she has had watery diarrhea since 11pm last night. Husband is at bedside

## 2012-02-23 NOTE — Progress Notes (Signed)
Pt has been on droplet precautions, family educated.

## 2012-02-23 NOTE — ED Notes (Signed)
Phenergan ordered from Pharmacy

## 2012-02-23 NOTE — ED Notes (Signed)
Attempted fluid challenge but patient is sleeping.

## 2012-02-23 NOTE — ED Notes (Signed)
Pt c/o n/v/d since midnight, generalized abdominal pain.

## 2012-02-23 NOTE — ED Provider Notes (Signed)
History     CSN: [id number]  Arrival date & time 02/23/12  0534   First MD Initiated Contact with Patient 02/23/12 [phone]      Chief Complaint  Patient presents with  . Emesis    (Consider location/radiation/quality/duration/timing/severity/associated sxs/prior treatment) HPI Comments: The patient awoke at 11:30 PM last night with acute onset of n/v/d.  She states that she has been vomiting continuously, and having watery diarrhea X multiple episodes, nothing makes better or worse, sx are constant, and associated with abd cramping.  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  She has had rectal phenergan pta without improvement.  The patient denies fevers, chills, swelling, rashes, cough, shortness of breath  Patient is a 62 y.o. female presenting with vomiting. The history is provided by the patient and the spouse.  Emesis     Past Medical History  Diagnosis Date  . Depression   . Myocardial infarction   . CAD (coronary artery disease)   . Hyperthyroidism     Past Surgical History  Procedure Date  . Ptca     History reviewed. No pertinent family history.  History  Substance Use Topics  . Smoking status: Never Smoker   . Smokeless tobacco: Not on file  . Alcohol Use: No    OB History    Grav Para Term Preterm Abortions TAB SAB Ect Mult Living                  Review of Systems  Gastrointestinal: Positive for vomiting.  All other systems reviewed and are negative.    Allergies  Prochlorperazine and Simvastatin  Home Medications   Current Outpatient Rx  Name  Route  Sig  Dispense  Refill  . ALENDRONATE SODIUM 70 MG PO TABS   Oral   Take 1 tablet (70 mg total) by mouth every 7 (seven) days. Take with a full glass of water on an empty stomach.   4 tablet   6   . ASPIRIN 81 MG PO TBEC   Oral   Take 81 mg by mouth daily.           . ERGOCALCIFEROL 50000  UNITS PO CAPS   Oral   Take 1 capsule (50,000 Units total) by mouth once a week.   12 capsule   0   . PAROXETINE HCL 20 MG PO TABS      TAKE  ONE TABLET BY MOUTH EVERY MORNING   30 tablet   4   . PREDNISONE 5 MG PO TABS      Take as directed         . SYNTHROID 125 MCG PO TABS   Oral   Take 1 tablet (125 mcg total) by mouth daily.   30 each   5     Dispense as written.   Marland Kitchen SYNTHROID 125 MCG PO TABS      TAKE ONE TABLET BY MOUTH ONE TIME DAILY   30 tablet   4     Dispense as written.     BP 137/83  Pulse 96  Temp 97.8 F (36.6 C) (Oral)  Resp 24  SpO2 100%  Physical Exam  Nursing note and vitals reviewed. Constitutional: She appears well-developed and well-nourished.       Overweight, uncomfortable appearing  HENT:  Head: Normocephalic and atraumatic.  Mouth/Throat: Oropharynx is clear and moist. No oropharyngeal exudate.  Eyes: Conjunctivae normal and EOM are normal. Pupils are equal, round, and reactive to light. Right eye exhibits no discharge. Left eye exhibits no discharge. No scleral icterus.  Neck: Normal range of motion. Neck supple. No JVD present. No thyromegaly present.  Cardiovascular: Regular rhythm, normal heart sounds and intact distal pulses.  Exam reveals no gallop and no friction rub.   No murmur heard.      Tachycardia to 110  Pulmonary/Chest: Effort normal and breath sounds normal. No respiratory distress. She has no wheezes. She has no rales.       Tachypnea, speaks in full sentences, clear lung sounds  Abdominal: Soft. Bowel sounds are normal. She exhibits no distension and no mass. There is no tenderness.       Non tender, soft, normal bowel sounds  Musculoskeletal: Normal range of motion. She exhibits no edema and no tenderness.  Lymphadenopathy:    She has no cervical adenopathy.  Neurological: She is alert. Coordination normal.  Skin: Skin is warm and dry. No rash noted. No erythema.  Psychiatric: She has a normal mood and  affect. Her behavior is normal.    ED Course  Procedures (including critical care time)   Labs Reviewed  CBC WITH DIFFERENTIAL  COMPREHENSIVE METABOLIC PANEL  URINALYSIS, MICROSCOPIC ONLY   No results found.   No diagnosis found.    MDM  Likely has gastroenteritis - infectious - has mild tachycardia and tachypnea which is likely related to combination of dehydration and ongoing nausea.  Zofran, fluids, phenergan, evaluate electrolytes.  Nursing has ordered labs as per protocol.  IV placed and IVF started.  Change of shift - care signed out to Dr. Manus Gunning.  At this time the labs are normal, pt has received IVF and antiemetics.     Vida Roller, MD 02/23/12 [phone]

## 2012-02-23 NOTE — Progress Notes (Signed)
Pt refused all PO medications.  Nurse attempted again once pt returned from radiology.  Critical value paged to MD, CT scan of abdomen and pelvis with contrast to be performed.  Pt continues to refuse PO medications and stated to this nurse, "I don't want that (CT scan) done tonight.  I don't feel like it".  Will notify MD.

## 2012-02-23 NOTE — ED Provider Notes (Cosign Needed Addendum)
This chart was scribed for [name], MD by Bennett Scrape, ED Scribe. This patient was seen in room D31C/D31C and the patient's care was started at 8:38 AM.  [name] is a 62 y.o. female who presents to the Emergency Department complaining of emesis with associated nausea, diarrhea and generalized abdominal paint that started 8 to 9 hours ago. She denies having any further episodes of emesis since Zofran and phenergan injections were given 2 hours ago. She reports that she is still nauseated and having abdominal pain.  PE: ENT: Dry mucous membranes ABN: Soft, non-tender  Recheck at 8:45 AM. Patient sleeping comfortably complains of nausea but has not vomited. Abdomen soft and nontender.  9:00 AM- Ordered 4 mg Zofran injection  10:00 AM- Previous Attending ordered 25 mg phenergan injection  10:04 AM- Pt rechecked and is feeling improved. Will do PO challenge and discharge home. Pt is agreeable to this plan.  Rechecked at 10:28 AM. Pt is sleeping comfortably. Upon waking pt, HR was 120. She denies nausea and abdominal pain currently. Will order IV fluids.  10:45 AM-Ordered 1,000 mL of bolus  Rechecked at 11:25 AM. Pt is still sleepily. Discussed admission for fluids and pt agreed.  11:58 AM-Consult complete with Dr. Lenard Simmer. Patient case explained and discussed. Dr. Lenard Simmer agrees to admit patient for further evaluation and treatment. Call ended at 12:00 PM.  Patient remains tachycardic and febrile. She continues to complain of nausea and is not drinking in the ED. Will admit for continued hydration and symptom control. Abdomen is soft and nonsurgical.  I personally performed the services described in this documentation, which was scribed in my presence. The recorded information has been reviewed and is accurate.    [name], MD 02/23/12 1244  [name], MD 02/23/12 [phone]

## 2012-02-23 NOTE — Progress Notes (Signed)
CRITICAL VALUE ALERT  Critical value received: Radiology  Date of notification:02/23/2012  Time of notification: 1744  Critical value read back:yes  Nurse who received alert:AMB  MD notified (1st page): Elmahi  Time of first page:  1748  MD notified (2nd page):none   Time of second page:none  Responding MD:  Arthor Captain  Time MD responded:  1610

## 2012-02-23 NOTE — ED Notes (Signed)
Patient could not tolerate sitting up off side of bed or standing for the orthostatic vs.

## 2012-02-23 NOTE — H&P (Signed)
[facility] History and Physical  [name] WUJ:[id number] DOB: 12/16/1950 DOA: 02/23/2012  Referring physician: Donette Larry PCP: Judie Petit, MD   Chief Complaint: Nausea and vomiting  HPI: [name] is a 62 y.o. female past medical history of hypothyroidism, CAD and PMR. Patient works at a [employer]. She said for the past 4-5 days she developed respiratory symptoms including cough, shortness of breath and some wheezes after her husband awoke the same symptoms. She was able to tolerate her symptoms and go to work, then last night she developed severe nausea and vomiting. She vomited about 5 times total she came into the emergency department. Patient is not able to tolerate a diet or to take any medications. She mentioned that she had contact with several sick kids at work. She was very weak, she is admitted to the hospital for further IV fluid hydration and medical management.  Review of Systems:  Constitutional: negative for anorexia, fevers and sweats Eyes: negative for irritation, redness and visual disturbance Ears, nose, mouth, throat, and face: negative for earaches, epistaxis, nasal congestion and sore throat Respiratory: negative for cough, dyspnea on exertion, sputum and wheezing Cardiovascular: negative for chest pain, dyspnea, lower extremity edema, orthopnea, palpitations and syncope Gastrointestinal:Per HPI Genitourinary:negative for dysuria, frequency and hematuria Hematologic/lymphatic: negative for bleeding, easy bruising and lymphadenopathy Musculoskeletal:negative for arthralgias, muscle weakness and stiff joints Neurological: negative for coordination problems, gait problems, headaches and weakness Endocrine: negative for diabetic symptoms including polydipsia, polyuria and weight loss he Allergic/Immunologic: negative for anaphylaxis, hay fever and urticaria  Past Medical History  Diagnosis Date  . Depression   . Myocardial  infarction   . CAD (coronary artery disease)   . Hyperthyroidism    Past Surgical History  Procedure Date  . Ptca    Social History:  reports that she has never smoked. She does not have any smokeless tobacco history on file. She reports that she does not drink alcohol or use illicit drugs.   Allergies  Allergen Reactions  . Prochlorperazine     REACTION: nerve reaction  . Simvastatin     REACTION: leg cramps    Family History  Problem Relation Age of Onset  . Hypertension Father      Prior to Admission medications   Medication Sig Start Date End Date Taking? Authorizing Provider  alendronate (FOSAMAX) 70 MG tablet Take 1 tablet (70 mg total) by mouth every 7 (seven) days. Take with a full glass of water on an empty stomach. 09/03/11 09/02/12 Yes Bruce Romilda Garret, MD  aspirin 81 MG EC tablet Take 81 mg by mouth daily.     Yes Historical Provider, MD  ergocalciferol (VITAMIN D2) 50000 UNITS capsule Take 1 capsule (50,000 Units total) by mouth once a week. 08/23/11 08/22/12 Yes Bruce Rexene Edison Swords, MD  PARoxetine (PAXIL) 20 MG tablet TAKE  ONE TABLET BY MOUTH EVERY MORNING 02/18/12  Yes Bruce Romilda Garret, MD  predniSONE (DELTASONE) 5 MG tablet Take 5 mg by mouth daily.   Yes Historical Provider, MD  SYNTHROID 125 MCG tablet Take 1 tablet (125 mcg total) by mouth daily. 10/11/10  Yes Lindley Magnus, MD   Physical Exam: Filed Vitals:   02/23/12 1104 02/23/12 1105 02/23/12 1239 02/23/12 1305  BP: 120/63 123/62 130/64 123/52  Pulse: 105 104 115 105  Temp: 103.6 F (39.8 C)   99.6 F (37.6 C)  TempSrc: Rectal   Oral  Resp:   18 22  SpO2:   98%  96%   General appearance: alert, cooperative and no distress  Head: Normocephalic, without obvious abnormality, atraumatic  Eyes: conjunctivae/corneas clear. PERRL, EOM's intact. Fundi benign.  Nose: Nares normal. Septum midline. Mucosa normal. No drainage or sinus tenderness.  Throat: lips, mucosa, and tongue normal; teeth and gums normal  Neck:  Supple, no masses, no cervical lymphadenopathy, no JVD appreciated, no meningeal signs Resp: clear to auscultation bilaterally  Chest wall: no tenderness  Cardio: regular rate and rhythm, S1, S2 normal, no murmur, click, rub or gallop  GI: soft, non-tender; bowel sounds normal; no masses, no organomegaly  Extremities: extremities normal, atraumatic, no cyanosis or edema  Skin: Skin color, texture, turgor normal. No rashes or lesions  Neurologic: Alert and oriented X 3, normal strength and tone. Normal symmetric reflexes. Normal coordination and gait   Labs on Admission:  Basic Metabolic Panel:  Lab 02/23/12 1610  NA 139  K 4.1  CL 99  CO2 24  GLUCOSE 142*  BUN 24*  CREATININE 0.94  CALCIUM 9.3  MG --  PHOS --   Liver Function Tests:  Lab 02/23/12 0549  AST 22  ALT 16  ALKPHOS 84  BILITOT 0.6  PROT 7.1  ALBUMIN 3.7   No results found for this basename: LIPASE:5,AMYLASE:5 in the last 168 hours No results found for this basename: AMMONIA:5 in the last 168 hours CBC:  Lab 02/23/12 0549  WBC 10.6*  NEUTROABS 9.5*  HGB 14.7  HCT 45.0  MCV 90.9  PLT 199   Cardiac Enzymes: No results found for this basename: CKTOTAL:5,CKMB:5,CKMBINDEX:5,TROPONINI:5 in the last 168 hours  BNP (last 3 results) No results found for this basename: PROBNP:3 in the last 8760 hours CBG: No results found for this basename: GLUCAP:5 in the last 168 hours  Radiological Exams on Admission: No results found.   Assessment/Plan Principal Problem:  *Gastroenteritis Active Problems:  Hypothyroid  Polymyalgia rheumatica  Dehydration  Fever  URTI (acute upper respiratory infection)   Nausea and vomiting -This is likely viral gastroenteritis, consistent with recent contact with sick patients. -To treat symptomatically with IV fluids for hydration, antiemetics for nausea. -I will check abdominal x-ray to rule out other causes of nausea/vomiting.  URTI -Has nonproductive cough, wheezing  and shortness of breath. Her husband has same symptoms. -He developed fever while she was in the emergency department, she reported that she is vaccinated for the fluid October of last year. -I will start her empirically on Tamiflu, check flu PCR and droplet isolation.  PMR -Patient is on prednisone at home continue current dose. -If patient developed hypotension of decompensation she might need stress dose of steroids.  Hypothyroidism -Continue current Synthroid dose, check TSH.  Code Status: Full code Family Communication: Spoke with the patient and her husband at bedside. Disposition Plan: Observation, MedSurg  Time spent: 70 minutes  [facility] A [facility] Pager [phone]  If 7PM-7AM, please contact night-coverage [email or web address] Password [email or web address] 02/23/2012, 2:50 PM

## 2012-02-24 ENCOUNTER — Observation Stay (HOSPITAL_COMMUNITY): Payer: No Typology Code available for payment source

## 2012-02-24 DIAGNOSIS — M353 Polymyalgia rheumatica: Secondary | ICD-10-CM

## 2012-02-24 LAB — INFLUENZA PANEL BY PCR (TYPE A & B): Influenza B By PCR: NEGATIVE

## 2012-02-24 LAB — BASIC METABOLIC PANEL
BUN: 10 mg/dL (ref 6–23)
Chloride: 102 mEq/L (ref 96–112)
Creatinine, Ser: 0.92 mg/dL (ref 0.50–1.10)
GFR calc Af Amer: 76 mL/min — ABNORMAL LOW (ref >90)
GFR calc non Af Amer: 65 mL/min — ABNORMAL LOW (ref >90)

## 2012-02-24 LAB — TSH: TSH: 0.608 u[IU]/mL (ref 0.350–4.500)

## 2012-02-24 LAB — CBC
HCT: 37.8 % (ref 36.0–46.0)
MCHC: 31 g/dL (ref 30.0–36.0)
RDW: 14.6 % (ref 11.5–15.5)

## 2012-02-24 NOTE — Progress Notes (Addendum)
[facility] PROGRESS NOTE Assessment/Plan: Gastroenteritis/Fever/URTI (acute upper respiratory infection) (02/23/2012) - Nausea diarrhea resolved. - Influenza PCR negative continue tamiflu. -  Unclear etiology of fevers: 103.0 fever continue tylenol. moniotr fever curve. - CXR pulmonary congestion, no overt pulmonary edema. No infiltrate. KVO IV fluids. - Refused Ct of abd/pelvis, no vaginal discharge. U/sa 0-2 WBC. - Blood cultures x2 1.12.2014, CBC and b-met in am.  Hypothyroid: - cont synthroid.  Polymyalgia rheumatica: - BP and sodium WNL.  Dehydration : - continue IV fluids.   Code Status: full Family Communication: none  Disposition Plan: obs   Consultants:  none  Procedures:  none  Antibiotics:  tamiflu  1.11.2014>>1.12.2014  HPI/Subjective: She relates nausea resolved, still anorexic. Diarhea resolved.  Objective: Filed Vitals:   02/23/12 1305 02/23/12 1513 02/23/12 2051 02/24/12 0623  BP: 123/52  118/51 114/51  Pulse: 105  101 84  Temp: 99.6 F (37.6 C)  97.9 F (36.6 C) 99.3 F (37.4 C)  TempSrc: Oral  Axillary Axillary  Resp: 22  21 20   SpO2: 96% 93% 93% 94%   No intake or output data in the 24 hours ending 02/24/12 0823 There were no vitals filed for this visit.  Exam:  General: Alert, awake, oriented x3, in no acute distress.  HEENT: No bruits, no goiter.  Heart: Regular rate and rhythm, without murmurs, rubs, gallops.  Lungs: Good air movement, crackles on the left. Abdomen: Soft, nontender, nondistended, positive bowel sounds.  Neuro: Grossly intact, nonfocal.   Data Reviewed: Basic Metabolic Panel:  Lab 02/24/12 9604 02/23/12 1511 02/23/12 0549  NA 136 -- 139  K 3.7 -- 4.1  CL 102 -- 99  CO2 25 -- 24  GLUCOSE 103* -- 142*  BUN 10 -- 24*  CREATININE 0.92 1.01 0.94  CALCIUM 8.1* -- 9.3  MG -- -- --  PHOS -- -- --   Liver Function Tests:  Lab 02/23/12 0549  AST 22  ALT 16  ALKPHOS 84  BILITOT 0.6  PROT 7.1    ALBUMIN 3.7   No results found for this basename: LIPASE:5,AMYLASE:5 in the last 168 hours No results found for this basename: AMMONIA:5 in the last 168 hours CBC:  Lab 02/24/12 0510 02/23/12 1511 02/23/12 0549  WBC 7.0 7.6 10.6*  NEUTROABS -- -- 9.5*  HGB 11.7* 12.8 14.7  HCT 37.8 40.5 45.0  MCV 93.6 93.3 90.9  PLT 142* 148* 199   Cardiac Enzymes: No results found for this basename: CKTOTAL:5,CKMB:5,CKMBINDEX:5,TROPONINI:5 in the last 168 hours BNP (last 3 results) No results found for this basename: PROBNP:3 in the last 8760 hours CBG: No results found for this basename: GLUCAP:5 in the last 168 hours  No results found for this or any previous visit (from the past 240 hour(s)).   Studies: Dg Abd 2 Views  02/23/2012  *RADIOLOGY REPORT*  Clinical Data: Nausea and vomiting  ABDOMEN - 2 VIEW  Comparison: None.  Findings: There are mildly dilated loops of small bowel up to 30 mm.  There is  short air fluid levels within colon.  The colon is not dilated.  There are is no evidence of gas in the rectum. No intraperitoneal free air.  IMPRESSION: Mildly dilated loops of small bowel and gas filled colon with no gas in the rectum.  Cannot exclude a distal obstruction of the colon.  Consider CT of the abdomen and pelvis with contrast.  This was made call report.   Original Report Authenticated By: Genevive Bi, M.D.  Scheduled Meds:   . albuterol  2.5 mg Nebulization Q6H  . aspirin EC  81 mg Oral Daily  . guaiFENesin  1,200 mg Oral BID  . heparin  5,000 Units Subcutaneous Q8H  . ipratropium  0.5 mg Nebulization Q6H  . levofloxacin (LEVAQUIN) IV  500 mg Intravenous Q24H  . levothyroxine  125 mcg Oral QAC breakfast  . oseltamivir  75 mg Oral BID  . PARoxetine  20 mg Oral Daily  . predniSONE  5 mg Oral Daily   Continuous Infusions:   . sodium chloride 125 mL/hr (02/24/12 0612)     Marinda Elk  [facility] Pager [phone]. If 8PM-8AM, please contact  night-coverage at [email or web address], password [email or web address] 02/24/2012, 8:23 AM  LOS: 1 day

## 2012-02-24 NOTE — Progress Notes (Signed)
Utilization review completed.  

## 2012-02-25 ENCOUNTER — Encounter (HOSPITAL_COMMUNITY): Payer: Self-pay

## 2012-02-25 NOTE — Discharge Summary (Signed)
Physician Discharge Summary  [name]:[id number] DOB: 01/09/1951 DOA: 02/23/2012  PCP: Judie Petit, MD  Admit date: 02/23/2012 Discharge date: 02/25/2012  Time spent: 30 minutes  Recommendations for Outpatient Follow-up:  1. Follow up with PCP 2 wee.s (include homehealth, outpatient follow-up instructions, specific recommendations for PCP to follow-up on, etc.)  Discharge Diagnoses:  Principal Problem:  *Gastroenteritis Active Problems:  Hypothyroid  Polymyalgia rheumatica  Dehydration  Fever  URTI (acute upper respiratory infection)   Discharge Condition: stable  Diet recommendation: regular  There were no vitals filed for this visit.  History of present illness:  62 y.o. female past medical history of hypothyroidism, CAD and PMR. Patient works at a [employer]. She said for the past 4-5 days she developed respiratory symptoms including cough, shortness of breath and some wheezes after her husband awoke the same symptoms. She was able to tolerate her symptoms and go to work, then last night she developed severe nausea and vomiting. She vomited about 5 times total she came into the emergency department. Patient is not able to tolerate a diet or to take any medications. She mentioned that she had contact with several sick kids at work. She was very weak, she is admitted to the hospital for further IV fluid hydration and medical management.   Hospital Course:  Gastroenteritis/Fever/URTI (acute upper respiratory infection) (02/23/2012) - Nausea diarrhea resolved.  - Influenza PCR negative treated empirically on admission with tamiflu discontinue on day 2..  - Unclear etiology of fevers: 103.0 fever continue tylenol. moniotr fever curve.  - CXR pulmonary congestion, no overt pulmonary edema. No infiltrate. KVO IV fluids.  - Refused Ct of abd/pelvis, no vaginal discharge. U/sa 0-2 WBC.  - Blood cultures x2 1.12.2014 negative,  - most likely viral in  nature.  Hypothyroid: - cont synthroid.   Polymyalgia rheumatica: - BP and sodium WNL.   Dehydration : - continue IV fluids.    Procedures:  none (i.e. Studies not automatically included, echos, thoracentesis, etc; not x-rays)  Consultations:  none  Discharge Exam: Filed Vitals:   02/24/12 1332 02/24/12 2013 02/24/12 2025 02/25/12 0623  BP: 126/64  108/58 109/61  Pulse: 94  79 67  Temp: 100.3 F (37.9 C)  98 F (36.7 C) 98.6 F (37 C)  TempSrc:   Axillary Axillary  Resp: 16  16 16   SpO2: 98% 99% 99% 95%    General: A&O x3  Cardiovascular: RRR Respiratory: good air movement CTA B/L  Discharge Instructions  Discharge Orders    Future Orders Please Complete By Expires   Diet - low sodium heart healthy      Increase activity slowly          Medication List     As of 02/25/2012  8:00 AM    TAKE these medications         alendronate 70 MG tablet   Commonly known as: FOSAMAX   Take 1 tablet (70 mg total) by mouth every 7 (seven) days. Take with a full glass of water on an empty stomach.      aspirin 81 MG EC tablet   Take 81 mg by mouth daily.      ergocalciferol 50000 UNITS capsule   Commonly known as: VITAMIN D2   Take 1 capsule (50,000 Units total) by mouth once a week.      PARoxetine 20 MG tablet   Commonly known as: PAXIL   TAKE  ONE TABLET BY MOUTH EVERY MORNING  predniSONE 5 MG tablet   Commonly known as: DELTASONE   Take 5 mg by mouth daily.      SYNTHROID 125 MCG tablet   Generic drug: levothyroxine   Take 1 tablet (125 mcg total) by mouth daily.           Follow-up Information    Follow up with Judie Petit, MD. In 2 weeks. (hospital follow up)    Contact information:   [address] Christena Flake [facility] [place] [address] [phone]           The results of significant diagnostics from this hospitalization (including imaging, microbiology, ancillary and laboratory) are listed below for reference.    Significant  Diagnostic Studies: Dg Chest 2 View  02/24/2012  *RADIOLOGY REPORT*  Clinical Data: 62 year old female nausea vomiting fatigue.  CHEST - 2 VIEW  Comparison: 02/25/2008.  Findings: Semi upright AP and lateral views of the chest.  Lower lung volumes.  Lordotic AP view.  Stable cardiac size and mediastinal contours.  Cardiac size at the upper limits of normal or mildly enlarged.  No pneumothorax, pleural effusion or consolidation.  Increased pulmonary vascularity without overt pulmonary edema.  No other confluent pulmonary opacity. No acute osseous abnormality identified.   No pneumoperitoneum identified.  IMPRESSION: Lower lung volumes and increased pulmonary vascularity without overt edema.   Original Report Authenticated By: Erskine Speed, M.D.    Dg Abd 2 Views  02/23/2012  *RADIOLOGY REPORT*  Clinical Data: Nausea and vomiting  ABDOMEN - 2 VIEW  Comparison: None.  Findings: There are mildly dilated loops of small bowel up to 30 mm.  There is  short air fluid levels within colon.  The colon is not dilated.  There are is no evidence of gas in the rectum. No intraperitoneal free air.  IMPRESSION: Mildly dilated loops of small bowel and gas filled colon with no gas in the rectum.  Cannot exclude a distal obstruction of the colon.  Consider CT of the abdomen and pelvis with contrast.  This was made call report.   Original Report Authenticated By: Genevive Bi, M.D.     Microbiology: No results found for this or any previous visit (from the past 240 hour(s)).   Labs: Basic Metabolic Panel:  Lab 02/24/12 8657 02/23/12 1511 02/23/12 0549  NA 136 -- 139  K 3.7 -- 4.1  CL 102 -- 99  CO2 25 -- 24  GLUCOSE 103* -- 142*  BUN 10 -- 24*  CREATININE 0.92 1.01 0.94  CALCIUM 8.1* -- 9.3  MG -- -- --  PHOS -- -- --   Liver Function Tests:  Lab 02/23/12 0549  AST 22  ALT 16  ALKPHOS 84  BILITOT 0.6  PROT 7.1  ALBUMIN 3.7   No results found for this basename: LIPASE:5,AMYLASE:5 in the last 168  hours No results found for this basename: AMMONIA:5 in the last 168 hours CBC:  Lab 02/24/12 0510 02/23/12 1511 02/23/12 0549  WBC 7.0 7.6 10.6*  NEUTROABS -- -- 9.5*  HGB 11.7* 12.8 14.7  HCT 37.8 40.5 45.0  MCV 93.6 93.3 90.9  PLT 142* 148* 199   Cardiac Enzymes: No results found for this basename: CKTOTAL:5,CKMB:5,CKMBINDEX:5,TROPONINI:5 in the last 168 hours BNP: BNP (last 3 results) No results found for this basename: PROBNP:3 in the last 8760 hours CBG: No results found for this basename: GLUCAP:5 in the last 168 hours     Signed:  Marinda Elk  [facility] 02/25/2012, 8:00 AM

## 2012-03-01 LAB — CULTURE, BLOOD (ROUTINE X 2)
Culture: NO GROWTH
Culture: NO GROWTH

## 2012-03-31 ENCOUNTER — Other Ambulatory Visit: Payer: Self-pay | Admitting: Internal Medicine

## 2012-03-31 DIAGNOSIS — Z1231 Encounter for screening mammogram for malignant neoplasm of breast: Secondary | ICD-10-CM

## 2012-04-04 LAB — HM MAMMOGRAPHY

## 2012-04-07 ENCOUNTER — Encounter: Payer: Self-pay | Admitting: Internal Medicine

## 2012-05-14 ENCOUNTER — Other Ambulatory Visit: Payer: Self-pay | Admitting: Internal Medicine

## 2012-05-23 ENCOUNTER — Telehealth: Payer: Self-pay | Admitting: Internal Medicine

## 2012-05-23 NOTE — Telephone Encounter (Signed)
Pt would like to ensure she has thyroid panel done on 5/2 for her cpx labs, AND pt wants Vitamin D level done that day.Pls advise.

## 2012-05-23 NOTE — Telephone Encounter (Signed)
Thyroid is included in cpx labs, ok to add vitamin d level to labs

## 2012-05-27 ENCOUNTER — Other Ambulatory Visit: Payer: Self-pay | Admitting: Orthopaedic Surgery

## 2012-05-27 ENCOUNTER — Ambulatory Visit: Payer: No Typology Code available for payment source | Admitting: Family Medicine

## 2012-05-27 DIAGNOSIS — M545 Low back pain, unspecified: Secondary | ICD-10-CM

## 2012-06-05 ENCOUNTER — Ambulatory Visit
Admission: RE | Admit: 2012-06-05 | Discharge: 2012-06-05 | Disposition: A | Discharge status: Home / Self Care | Payer: No Typology Code available for payment source | Source: Ambulatory Visit | Attending: Orthopaedic Surgery | Admitting: Orthopaedic Surgery

## 2012-06-05 DIAGNOSIS — M545 Low back pain, unspecified: Secondary | ICD-10-CM

## 2012-06-06 ENCOUNTER — Other Ambulatory Visit (INDEPENDENT_AMBULATORY_CARE_PROVIDER_SITE_OTHER): Payer: No Typology Code available for payment source

## 2012-06-06 DIAGNOSIS — Z0000 Encounter for general adult medical examination without abnormal findings: Secondary | ICD-10-CM

## 2012-06-06 LAB — CBC WITH DIFFERENTIAL/PLATELET
Basophils Relative: 1 % (ref 0.0–3.0)
Eosinophils Absolute: 0.3 10*3/uL (ref 0.0–0.7)
Eosinophils Relative: 4.5 % (ref 0.0–5.0)
HCT: 39.8 % (ref 36.0–46.0)
Hemoglobin: 13.5 g/dL (ref 12.0–15.0)
Lymphs Abs: 1.3 10*3/uL (ref 0.7–4.0)
MCHC: 33.9 g/dL (ref 30.0–36.0)
MCV: 88.4 fl (ref 78.0–100.0)
Monocytes Absolute: 0.3 10*3/uL (ref 0.1–1.0)
Neutro Abs: 4 10*3/uL (ref 1.4–7.7)
RBC: 4.5 Mil/uL (ref 3.87–5.11)
WBC: 5.9 10*3/uL (ref 4.5–10.5)

## 2012-06-06 LAB — POCT URINALYSIS DIPSTICK
Ketones, UA: NEGATIVE
Protein, UA: NEGATIVE
Urobilinogen, UA: 0.2
pH, UA: 6.5

## 2012-06-06 LAB — HEPATIC FUNCTION PANEL
ALT: 19 U/L (ref 0–35)
Albumin: 3.6 g/dL (ref 3.5–5.2)
Total Protein: 6.8 g/dL (ref 6.0–8.3)

## 2012-06-06 LAB — BASIC METABOLIC PANEL
CO2: 25 mEq/L (ref 19–32)
Chloride: 104 mEq/L (ref 96–112)
Creatinine, Ser: 0.8 mg/dL (ref 0.4–1.2)
Potassium: 3.8 mEq/L (ref 3.5–5.1)

## 2012-06-06 LAB — LIPID PANEL
Cholesterol: 259 mg/dL — ABNORMAL HIGH (ref 0–200)
Total CHOL/HDL Ratio: 5
VLDL: 10.2 mg/dL (ref 0.0–40.0)

## 2012-06-06 NOTE — Addendum Note (Signed)
Addended by: Bonnye Fava on: 06/06/2012 09:43 AM   Modules accepted: Orders

## 2012-06-08 LAB — URINE CULTURE: Colony Count: >=100000

## 2012-06-13 ENCOUNTER — Other Ambulatory Visit: Payer: No Typology Code available for payment source

## 2012-06-13 MED ORDER — CIPROFLOXACIN HCL 250 MG PO TABS: 250.0000 mg | ORAL_TABLET | Freq: Two times a day (BID) | ORAL | Status: DC

## 2012-06-13 MED ORDER — VITAMIN D (ERGOCALCIFEROL) 1.25 MG (50000 UNIT) PO CAPS: 50000.0000 [IU] | ORAL_CAPSULE | ORAL | Status: DC

## 2012-06-13 NOTE — Progress Notes (Signed)
Quick Note:  I spoke with pt and called in both scripts. ______

## 2012-06-13 NOTE — Addendum Note (Signed)
Addended by: Aniceto Boss A on: 06/13/2012 01:44 PM   Modules accepted: Orders

## 2012-06-19 ENCOUNTER — Encounter: Payer: Self-pay | Admitting: Family

## 2012-06-19 ENCOUNTER — Ambulatory Visit (INDEPENDENT_AMBULATORY_CARE_PROVIDER_SITE_OTHER): Payer: No Typology Code available for payment source | Admitting: Family

## 2012-06-19 VITALS — BP 126/82 | HR 86 | Ht 62.5 in | Wt 161.0 lb

## 2012-06-19 DIAGNOSIS — Z0000 Encounter for general adult medical examination without abnormal findings: Secondary | ICD-10-CM

## 2012-06-19 DIAGNOSIS — E7800 Pure hypercholesterolemia, unspecified: Secondary | ICD-10-CM

## 2012-06-19 MED ORDER — PITAVASTATIN CALCIUM 2 MG PO TABS: 2.0000 mg | ORAL_TABLET | Freq: Every day | ORAL | Status: DC

## 2012-06-19 NOTE — Patient Instructions (Addendum)

## 2012-06-19 NOTE — Progress Notes (Signed)
Subjective:    Patient ID: [name], female    DOB: 19-Jan-1951, 62 y.o.   MRN: [id number]  HPI  62 year old white female, nonsmoker, patient of Dr. Cato Mulligan is in for a routine physical examination for this healthy  Female. Reviewed all health maintenance protocols including mammography colonoscopy bone density and reviewed appropriate screening labs. Her immunization history was reviewed as well as her current medications and allergies refills of her chronic medications were given and the plan for yearly health maintenance was discussed all orders and referrals were made as appropriate.  She has a history of hypercholesterolemia, hypothyroidism, osteoporosis, and depression. She's currently stable on medications.  Review of Systems  Constitutional: Negative.   HENT: Negative.   Eyes: Negative.   Respiratory: Negative.   Cardiovascular: Negative.   Gastrointestinal: Negative.   Endocrine: Negative.   Genitourinary: Negative.   Musculoskeletal: Negative.   Skin: Negative.   Allergic/Immunologic: Negative.   Neurological: Negative.   Hematological: Negative.   Psychiatric/Behavioral: Negative.    Past Medical History  Diagnosis Date  . Depression   . Myocardial infarction   . CAD (coronary artery disease)   . Hyperthyroidism     History   Social History  . Marital Status: Married    Spouse Name: N/A    Number of Children: N/A  . Years of Education: N/A   Occupational History  . Not on file.   Social History Main Topics  . Smoking status: Never Smoker   . Smokeless tobacco: Not on file  . Alcohol Use: No  . Drug Use: No  . Sexually Active: Not on file   Other Topics Concern  . Not on file   Social History Narrative  . No narrative on file    Past Surgical History  Procedure Laterality Date  . Ptca      Family History  Problem Relation Age of Onset  . Hypertension Father     Allergies  Allergen Reactions  . Prochlorperazine     REACTION: nerve  reaction  . Simvastatin     REACTION: leg cramps    Current Outpatient Prescriptions on File Prior to Visit  Medication Sig Dispense Refill  . aspirin 81 MG EC tablet Take 81 mg by mouth daily.        . ciprofloxacin (CIPRO) 250 MG tablet Take 1 tablet (250 mg total) by mouth 2 (two) times daily.  6 tablet  0  . ergocalciferol (VITAMIN D2) 50000 UNITS capsule Take 1 capsule (50,000 Units total) by mouth once a week.  12 capsule  0  . levothyroxine (SYNTHROID, LEVOTHROID) 125 MCG tablet TAKE ONE TABLET BY MOUTH ONE TIME DAILY  30 tablet  3  . PARoxetine (PAXIL) 20 MG tablet TAKE  ONE TABLET BY MOUTH EVERY MORNING  30 tablet  4  . predniSONE (DELTASONE) 5 MG tablet Take 5 mg by mouth daily.      . Vitamin D, Ergocalciferol, (DRISDOL) 50000 UNITS CAPS Take 1 capsule (50,000 Units total) by mouth every 7 (seven) days.  4 capsule  2  . alendronate (FOSAMAX) 70 MG tablet Take 1 tablet (70 mg total) by mouth every 7 (seven) days. Take with a full glass of water on an empty stomach.  4 tablet  6   No current facility-administered medications on file prior to visit.    BP 126/82  Pulse 86  Ht 5' 2.5" (1.588 m)  Wt 161 lb (73.029 kg)  BMI 28.96 kg/m2  SpO2 98%chart  Objective:   Physical Exam  Constitutional: She is oriented to person, place, and time. She appears well-developed and well-nourished.  HENT:  Head: Normocephalic.  Right Ear: External ear normal.  Left Ear: External ear normal.  Nose: Nose normal.  Mouth/Throat: Oropharynx is clear and moist.  Eyes: Conjunctivae and EOM are normal. Pupils are equal, round, and reactive to light.  Neck: Normal range of motion. Neck supple. No thyromegaly present.  Cardiovascular: Normal rate, regular rhythm and normal heart sounds.   Pulmonary/Chest: Effort normal and breath sounds normal.  Abdominal: Soft. Bowel sounds are normal. There is no tenderness. There is no rebound and no guarding.  Musculoskeletal: Normal range of motion.   Neurological: She is alert and oriented to person, place, and time. She has normal reflexes. No cranial nerve deficit. Coordination normal.  Skin: Skin is warm and dry.  Psychiatric: She has a normal mood and affect.          Assessment & Plan:  Assessment:  1. Complete physical exam 2. Hypercholesterolemia 3. Hypothyroidism  4. Depression  Plan: Continue followup with gynecology for gynecological care. Mammogram completed. We'll try Livalo 2 mg once daily. In the past has been unable to tolerate statins due to myalgias. 3 weeks of samples given. We'll followup and see how she does consider that long-term for treatment of hypercholesterolemia. Encouraged a low sodium diet. Exercise daily. Patient call the office with any questions or concerns. Recheck in 6 weeks, and sooner as needed.

## 2012-06-23 ENCOUNTER — Other Ambulatory Visit: Payer: No Typology Code available for payment source

## 2012-08-10 ENCOUNTER — Other Ambulatory Visit: Payer: Self-pay | Admitting: Internal Medicine

## 2012-08-11 ENCOUNTER — Other Ambulatory Visit (HOSPITAL_COMMUNITY): Payer: Self-pay | Admitting: Internal Medicine

## 2012-09-17 ENCOUNTER — Other Ambulatory Visit: Payer: Self-pay | Admitting: Internal Medicine

## 2012-11-24 ENCOUNTER — Encounter (HOSPITAL_COMMUNITY)
Admission: EM | Disposition: A | Discharge status: Home / Self Care | Payer: Self-pay | Source: Home / Self Care | Attending: Emergency Medicine

## 2012-11-24 ENCOUNTER — Encounter (HOSPITAL_COMMUNITY): Payer: No Typology Code available for payment source | Admitting: Anesthesiology

## 2012-11-24 ENCOUNTER — Encounter (HOSPITAL_COMMUNITY): Payer: Self-pay | Admitting: Emergency Medicine

## 2012-11-24 ENCOUNTER — Observation Stay (HOSPITAL_COMMUNITY)
Admission: EM | Admit: 2012-11-24 | Discharge: 2012-11-25 | Disposition: A | Discharge status: Home / Self Care | Payer: No Typology Code available for payment source | Attending: General Surgery | Admitting: General Surgery

## 2012-11-24 ENCOUNTER — Emergency Department (HOSPITAL_COMMUNITY): Payer: No Typology Code available for payment source | Admitting: Anesthesiology

## 2012-11-24 ENCOUNTER — Emergency Department (HOSPITAL_COMMUNITY): Payer: No Typology Code available for payment source

## 2012-11-24 DIAGNOSIS — K8066 Calculus of gallbladder and bile duct with acute and chronic cholecystitis without obstruction: Secondary | ICD-10-CM

## 2012-11-24 DIAGNOSIS — E039 Hypothyroidism, unspecified: Secondary | ICD-10-CM | POA: Insufficient documentation

## 2012-11-24 DIAGNOSIS — M353 Polymyalgia rheumatica: Secondary | ICD-10-CM | POA: Insufficient documentation

## 2012-11-24 DIAGNOSIS — R1011 Right upper quadrant pain: Secondary | ICD-10-CM

## 2012-11-24 DIAGNOSIS — R112 Nausea with vomiting, unspecified: Secondary | ICD-10-CM

## 2012-11-24 DIAGNOSIS — K8000 Calculus of gallbladder with acute cholecystitis without obstruction: Principal | ICD-10-CM | POA: Diagnosis present

## 2012-11-24 DIAGNOSIS — I2510 Atherosclerotic heart disease of native coronary artery without angina pectoris: Secondary | ICD-10-CM | POA: Insufficient documentation

## 2012-11-24 DIAGNOSIS — I252 Old myocardial infarction: Secondary | ICD-10-CM | POA: Insufficient documentation

## 2012-11-24 HISTORY — DX: Hypothyroidism, unspecified: E03.9

## 2012-11-24 HISTORY — PX: CHOLECYSTECTOMY: SHX55

## 2012-11-24 HISTORY — DX: Polymyalgia rheumatica: M35.3

## 2012-11-24 LAB — URINALYSIS, ROUTINE W REFLEX MICROSCOPIC
Glucose, UA: NEGATIVE mg/dL
Specific Gravity, Urine: 1.007 (ref 1.005–1.030)
Urobilinogen, UA: 0.2 mg/dL (ref 0.0–1.0)
pH: 7.5 (ref 5.0–8.0)

## 2012-11-24 LAB — COMPREHENSIVE METABOLIC PANEL
ALT: 15 U/L (ref 0–35)
AST: 17 U/L (ref 0–37)
Albumin: 3.8 g/dL (ref 3.5–5.2)
Alkaline Phosphatase: 93 U/L (ref 39–117)
Calcium: 9.4 mg/dL (ref 8.4–10.5)
Potassium: 3.3 mEq/L — ABNORMAL LOW (ref 3.5–5.1)
Sodium: 135 mEq/L (ref 135–145)
Total Protein: 7.3 g/dL (ref 6.0–8.3)

## 2012-11-24 LAB — CBC WITH DIFFERENTIAL/PLATELET
Basophils Absolute: 0 10*3/uL (ref 0.0–0.1)
Eosinophils Absolute: 0 10*3/uL (ref 0.0–0.7)
Eosinophils Relative: 0 % (ref 0–5)
MCH: 30.1 pg (ref 26.0–34.0)
MCV: 89.5 fL (ref 78.0–100.0)
Neutrophils Relative %: 88 % — ABNORMAL HIGH (ref 43–77)
Platelets: 235 10*3/uL (ref 150–400)
RBC: 4.75 MIL/uL (ref 3.87–5.11)
RDW: 13.6 % (ref 11.5–15.5)
WBC: 12.5 10*3/uL — ABNORMAL HIGH (ref 4.0–10.5)

## 2012-11-24 LAB — POCT I-STAT TROPONIN I: Troponin i, poc: 0 ng/mL (ref 0.00–0.08)

## 2012-11-24 LAB — URINE MICROSCOPIC-ADD ON

## 2012-11-24 SURGERY — LAPAROSCOPIC CHOLECYSTECTOMY
Anesthesia: General | Wound class: Clean Contaminated

## 2012-11-24 MED ORDER — ONDANSETRON 4 MG PO TBDP
8.0000 mg | ORAL_TABLET | Freq: Once | ORAL | Status: AC
  Administered 2012-11-24: 8 mg via ORAL
  Filled 2012-11-24: qty 2

## 2012-11-24 MED ORDER — FENTANYL CITRATE 0.05 MG/ML IJ SOLN
INTRAMUSCULAR | Status: DC | PRN
  Administered 2012-11-24 (×4): 50 ug via INTRAVENOUS

## 2012-11-24 MED ORDER — PROMETHAZINE HCL 25 MG/ML IJ SOLN
25.0000 mg | Freq: Once | INTRAMUSCULAR | Status: AC
  Administered 2012-11-24: 25 mg via INTRAVENOUS
  Filled 2012-11-24 (×2): qty 1

## 2012-11-24 MED ORDER — MIDAZOLAM HCL 2 MG/2ML IJ SOLN: 0.5000 mg | Freq: Once | INTRAMUSCULAR | Status: DC | PRN

## 2012-11-24 MED ORDER — ONDANSETRON HCL 4 MG/2ML IJ SOLN
INTRAMUSCULAR | Status: DC | PRN
  Administered 2012-11-24: 4 mg via INTRAMUSCULAR

## 2012-11-24 MED ORDER — MEPERIDINE HCL 25 MG/ML IJ SOLN: 6.2500 mg | INTRAMUSCULAR | Status: DC | PRN

## 2012-11-24 MED ORDER — DEXTROSE-NACL 5-0.9 % IV SOLN
INTRAVENOUS | Status: DC
  Administered 2012-11-24: 21:00:00 via INTRAVENOUS

## 2012-11-24 MED ORDER — ONDANSETRON HCL 4 MG PO TABS: 4.0000 mg | ORAL_TABLET | Freq: Four times a day (QID) | ORAL | Status: DC | PRN

## 2012-11-24 MED ORDER — SUCCINYLCHOLINE CHLORIDE 20 MG/ML IJ SOLN
INTRAMUSCULAR | Status: DC | PRN
  Administered 2012-11-24 (×2): 100 mg via INTRAVENOUS

## 2012-11-24 MED ORDER — LACTATED RINGERS IV SOLN: INTRAVENOUS | Status: DC

## 2012-11-24 MED ORDER — HYDROCORTISONE SOD SUCCINATE 100 MG IJ SOLR
INTRAMUSCULAR | Status: DC | PRN
  Administered 2012-11-24: 100 mg via INTRAVENOUS

## 2012-11-24 MED ORDER — EPHEDRINE SULFATE 50 MG/ML IJ SOLN
INTRAMUSCULAR | Status: DC | PRN
  Administered 2012-11-24: 15 mg via INTRAVENOUS

## 2012-11-24 MED ORDER — PROMETHAZINE HCL 25 MG/ML IJ SOLN: 6.2500 mg | INTRAMUSCULAR | Status: DC | PRN

## 2012-11-24 MED ORDER — SODIUM CHLORIDE 0.9 % IV BOLUS (SEPSIS)
1000.0000 mL | Freq: Once | INTRAVENOUS | Status: AC
  Administered 2012-11-24: 1000 mL via INTRAVENOUS

## 2012-11-24 MED ORDER — BUPIVACAINE HCL (PF) 0.25 % IJ SOLN
INTRAMUSCULAR | Status: AC
  Filled 2012-11-24: qty 30

## 2012-11-24 MED ORDER — DEXAMETHASONE SODIUM PHOSPHATE 4 MG/ML IJ SOLN
INTRAMUSCULAR | Status: DC | PRN
  Administered 2012-11-24: 8 mg via INTRAVENOUS

## 2012-11-24 MED ORDER — METOCLOPRAMIDE HCL 5 MG/ML IJ SOLN
INTRAMUSCULAR | Status: DC | PRN
  Administered 2012-11-24: 10 mg via INTRAVENOUS

## 2012-11-24 MED ORDER — PROMETHAZINE HCL 25 MG/ML IJ SOLN
25.0000 mg | Freq: Once | INTRAMUSCULAR | Status: AC
  Administered 2012-11-24: 25 mg via INTRAVENOUS
  Filled 2012-11-24: qty 1

## 2012-11-24 MED ORDER — MORPHINE SULFATE 4 MG/ML IJ SOLN
4.0000 mg | Freq: Once | INTRAMUSCULAR | Status: AC
  Administered 2012-11-24: 4 mg via INTRAVENOUS
  Filled 2012-11-24: qty 1

## 2012-11-24 MED ORDER — ONDANSETRON HCL 4 MG/2ML IJ SOLN
4.0000 mg | Freq: Four times a day (QID) | INTRAMUSCULAR | Status: DC | PRN
  Filled 2012-11-24: qty 2

## 2012-11-24 MED ORDER — OXYCODONE-ACETAMINOPHEN 5-325 MG PO TABS
1.0000 | ORAL_TABLET | ORAL | Status: DC | PRN
  Administered 2012-11-24: 2 via ORAL
  Administered 2012-11-25: 1 via ORAL
  Filled 2012-11-24: qty 1

## 2012-11-24 MED ORDER — PROPOFOL 10 MG/ML IV BOLUS
INTRAVENOUS | Status: DC | PRN
  Administered 2012-11-24: 150 mg via INTRAVENOUS

## 2012-11-24 MED ORDER — BUPIVACAINE HCL 0.25 % IJ SOLN
INTRAMUSCULAR | Status: DC | PRN
  Administered 2012-11-24: 30 mL

## 2012-11-24 MED ORDER — CIPROFLOXACIN IN D5W 400 MG/200ML IV SOLN
400.0000 mg | INTRAVENOUS | Status: DC
  Filled 2012-11-24: qty 200

## 2012-11-24 MED ORDER — SCOPOLAMINE 1 MG/3DAYS TD PT72
MEDICATED_PATCH | TRANSDERMAL | Status: AC
  Administered 2012-11-24: 1 via TRANSDERMAL
  Filled 2012-11-24: qty 1

## 2012-11-24 MED ORDER — SODIUM CHLORIDE 0.9 % IV SOLN
Freq: Once | INTRAVENOUS | Status: AC
  Administered 2012-11-24: 12:00:00 via INTRAVENOUS

## 2012-11-24 MED ORDER — SODIUM CHLORIDE 0.9 % IR SOLN
Status: DC | PRN
  Administered 2012-11-24: 1000 mL

## 2012-11-24 MED ORDER — OXYCODONE HCL 5 MG/5ML PO SOLN: 5.0000 mg | Freq: Once | ORAL | Status: DC | PRN

## 2012-11-24 MED ORDER — OXYCODONE HCL 5 MG PO TABS
5.0000 mg | ORAL_TABLET | Freq: Once | ORAL | Status: DC | PRN
Start: 2012-11-24 — End: 2012-11-24

## 2012-11-24 MED ORDER — OXYCODONE-ACETAMINOPHEN 5-325 MG PO TABS
ORAL_TABLET | ORAL | Status: AC
  Administered 2012-11-24: 2 via ORAL
  Filled 2012-11-24: qty 2

## 2012-11-24 MED ORDER — FENTANYL CITRATE 0.05 MG/ML IJ SOLN: 25.0000 ug | INTRAMUSCULAR | Status: DC | PRN

## 2012-11-24 MED ORDER — HYDROMORPHONE HCL PF 1 MG/ML IJ SOLN
1.0000 mg | INTRAMUSCULAR | Status: DC | PRN
  Filled 2012-11-24: qty 1

## 2012-11-24 SURGICAL SUPPLY — 41 items
BENZOIN TINCTURE PRP APPL 2/3 (GAUZE/BANDAGES/DRESSINGS) ×2 IMPLANT
CANISTER SUCTION 2500CC (MISCELLANEOUS) ×2 IMPLANT
CHLORAPREP W/TINT 26ML (MISCELLANEOUS) ×2 IMPLANT
CLIP LIGATING HEMO O LOK GREEN (MISCELLANEOUS) ×2 IMPLANT
CLOTH BEACON ORANGE TIMEOUT ST (SAFETY) ×2 IMPLANT
COVER MAYO STAND STRL (DRAPES) IMPLANT
COVER SURGICAL LIGHT HANDLE (MISCELLANEOUS) ×2 IMPLANT
COVER TRANSDUCER ULTRASND (DRAPES) ×2 IMPLANT
DEVICE TROCAR PUNCTURE CLOSURE (ENDOMECHANICALS) ×2 IMPLANT
DRAPE C-ARM 42X72 X-RAY (DRAPES) IMPLANT
DRAPE UTILITY 15X26 W/TAPE STR (DRAPE) ×4 IMPLANT
ELECT REM PT RETURN 9FT ADLT (ELECTROSURGICAL) ×2
ELECTRODE REM PT RTRN 9FT ADLT (ELECTROSURGICAL) ×1 IMPLANT
GAUZE SPONGE 2X2 8PLY STRL LF (GAUZE/BANDAGES/DRESSINGS) ×1 IMPLANT
GLOVE BIO SURGEON STRL SZ7.5 (GLOVE) ×4 IMPLANT
GLOVE BIOGEL PI IND STRL 6.5 (GLOVE) ×1 IMPLANT
GLOVE BIOGEL PI IND STRL 7.5 (GLOVE) ×1 IMPLANT
GLOVE BIOGEL PI INDICATOR 6.5 (GLOVE) ×1
GLOVE BIOGEL PI INDICATOR 7.5 (GLOVE) ×1
GLOVE ECLIPSE 6.5 STRL STRAW (GLOVE) ×2 IMPLANT
GOWN STRL NON-REIN LRG LVL3 (GOWN DISPOSABLE) ×6 IMPLANT
GOWN STRL REIN XL XLG (GOWN DISPOSABLE) ×2 IMPLANT
IV CATH 14GX2 1/4 (CATHETERS) IMPLANT
KIT BASIN OR (CUSTOM PROCEDURE TRAY) ×2 IMPLANT
KIT ROOM TURNOVER OR (KITS) ×2 IMPLANT
NEEDLE INSUFFLATION 14GA 120MM (NEEDLE) ×2 IMPLANT
NS IRRIG 1000ML POUR BTL (IV SOLUTION) ×2 IMPLANT
PAD ARMBOARD 7.5X6 YLW CONV (MISCELLANEOUS) ×4 IMPLANT
POUCH SPECIMEN RETRIEVAL 10MM (ENDOMECHANICALS) IMPLANT
SCISSORS LAP 5X35 DISP (ENDOMECHANICALS) ×2 IMPLANT
SET CHOLANGIOGRAPHY FRANKLIN (SET/KITS/TRAYS/PACK) IMPLANT
SET IRRIG TUBING LAPAROSCOPIC (IRRIGATION / IRRIGATOR) ×2 IMPLANT
SLEEVE ENDOPATH XCEL 5M (ENDOMECHANICALS) ×4 IMPLANT
SPECIMEN JAR SMALL (MISCELLANEOUS) ×2 IMPLANT
SPONGE GAUZE 2X2 STER 10/PKG (GAUZE/BANDAGES/DRESSINGS) ×1
SUT MNCRL AB 3-0 PS2 18 (SUTURE) ×2 IMPLANT
TOWEL OR 17X24 6PK STRL BLUE (TOWEL DISPOSABLE) ×2 IMPLANT
TOWEL OR 17X26 10 PK STRL BLUE (TOWEL DISPOSABLE) ×2 IMPLANT
TRAY LAPAROSCOPIC (CUSTOM PROCEDURE TRAY) ×2 IMPLANT
TROCAR XCEL NON-BLD 11X100MML (ENDOMECHANICALS) ×2 IMPLANT
TROCAR XCEL NON-BLD 5MMX100MML (ENDOMECHANICALS) ×2 IMPLANT

## 2012-11-24 NOTE — H&P (Signed)
[name] [birth year]  [id number].   Primary Care MD: Dr. Birdie Sons Chief Complaint/Reason for Consult: abdominal pain HPI: This is a 62 yo female who had an MI in 2008 and has been cleared by her cardiologist for no further follow up, who began having RUQ abdominal pain with nausea and vomiting last night around 7:30pm.  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  Her pain as well as emesis persisted.  She denies fevers or chills.  She denies any chest pain, or chest pain on exertion, SOB.  She presented to the [facility] due to the above symptoms and was found to have gallbladder wall thickening with a stone lodged in the neck of the gallbladder on ultrasound.  We have been asked to see her for further recommendations.  ROS: Please see HPI, otherwise all other systems have been reviewed and are negative.  Family History  Problem Relation Age of Onset  . Hypertension Father     Past Medical History  Diagnosis Date  . Depression   . Myocardial infarction   . CAD (coronary artery disease)   . Polymyalgia rheumatica   . Hypothyroidism     Past Surgical History  Procedure Laterality Date  . Ptca    . Abdominal hysterectomy    . Eye surgery      Social History:  reports that she has never smoked. She does not have any smokeless tobacco history on file. She reports that she does not drink alcohol or use illicit drugs.  Allergies:  Allergies  Allergen Reactions  . Prochlorperazine     REACTION: nerve reaction  . Simvastatin     REACTION: leg cramps  . Sulfa Antibiotics      (Not in a hospital admission)  Blood pressure 111/60, pulse 75, temperature 98.1 F (36.7 C), temperature source Oral, resp. rate 18, SpO2 96.00%. Physical Exam: General: pleasant, WD, WN white female who is laying in bed and appears ill. HEENT: head is  normocephalic, atraumatic.  Sclera are noninjected.  PERRL.  Ears and nose without any masses or lesions.  Mouth is pink and moist Heart: regular, rate, and rhythm.  Normal s1,s2. No obvious murmurs, gallops, or rubs noted.  Palpable radial and pedal pulses bilaterally Lungs: CTAB, no wheezes, rhonchi, or rales noted.  Respiratory effort nonlabored Abd: soft, tender on the right side of her abdomen, greatest in the RUQ. + Murphy's sign, ND, +BS, no masses, hernias, or organomegaly MS: all 4 extremities are symmetrical with no cyanosis, clubbing, or edema. Skin: warm and dry with no masses, lesions, or rashes Psych: A&Ox3, but sedated secondary to medications.    Results for orders placed during the hospital encounter of 11/24/12 (from the past 48 hour(s))  CBC WITH DIFFERENTIAL     Status: Abnormal   Collection Time    11/24/12  7:30 AM      Result Value Range   WBC 12.5 (*) 4.0 - 10.5 K/uL   RBC 4.75  3.87 - 5.11 MIL/uL   Hemoglobin 14.3  12.0 - 15.0 g/dL   HCT 78.2  95.6 - 21.3 %   MCV 89.5  78.0 - 100.0 fL   MCH 30.1  26.0 - 34.0 pg   MCHC 33.6  30.0 - 36.0 g/dL   RDW 08.6  57.8 - 46.9 %   Platelets 235  150 - 400 K/uL   Neutrophils Relative % 88 (*) 43 - 77 %   Neutro Abs 11.0 (*) 1.7 - 7.7 K/uL   Lymphocytes Relative 6 (*) 12 - 46 %   Lymphs Abs 0.7  0.7 - 4.0 K/uL   Monocytes Relative 6  3 - 12 %   Monocytes Absolute 0.7  0.1 - 1.0 K/uL   Eosinophils Relative 0  0 - 5 %   Eosinophils Absolute 0.0  0.0 - 0.7 K/uL   Basophils Relative 0  0 - 1 %   Basophils Absolute 0.0  0.0 - 0.1 K/uL  COMPREHENSIVE METABOLIC PANEL     Status: Abnormal   Collection Time    11/24/12  7:30 AM      Result Value Range   Sodium 135  135 - 145 mEq/L   Potassium 3.3 (*) 3.5 - 5.1 mEq/L   Chloride 98  96 - 112 mEq/L   CO2 25  19 - 32 mEq/L   Glucose, Bld 121 (*) 70 - 99 mg/dL   BUN 14  6 - 23 mg/dL   Creatinine, Ser 0.98  0.50 - 1.10 mg/dL   Calcium 9.4  8.4 - 11.9 mg/dL   Total Protein 7.3   6.0 - 8.3 g/dL   Albumin 3.8  3.5 - 5.2 g/dL   AST 17  0 - 37 U/L   ALT 15  0 - 35 U/L   Alkaline Phosphatase 93  39 - 117 U/L   Total Bilirubin 0.7  0.3 - 1.2 mg/dL   GFR calc non Af Amer 88 (*) >90 mL/min   GFR calc Af Amer >90  >90 mL/min   Comment: (NOTE)     The eGFR has been calculated using the CKD EPI equation.     This calculation has not been validated in all clinical situations.     eGFR's persistently <90 mL/min signify possible Chronic Kidney     Disease.  LIPASE, BLOOD     Status: None   Collection Time    11/24/12  7:30 AM      Result Value Range   Lipase 29  11 - 59 U/L  POCT I-STAT TROPONIN I     Status: None   Collection Time    11/24/12  7:55 AM      Result Value Range   Troponin i, poc 0.00  0.00 - 0.08 ng/mL   Comment 3            Comment: Due to the release kinetics of cTnI,     a negative result within the first hours     of the onset of symptoms does not rule out     myocardial infarction with certainty.     If myocardial infarction is still suspected,     repeat the test at appropriate intervals.  URINALYSIS, ROUTINE W REFLEX MICROSCOPIC     Status: Abnormal   Collection Time    11/24/12  9:10 AM      Result Value Range   Color, Urine YELLOW  YELLOW   APPearance CLEAR  CLEAR   Specific Gravity, Urine 1.007  1.005 - 1.030   pH 7.5  5.0 - 8.0   Glucose, UA NEGATIVE  NEGATIVE mg/dL   Hgb urine dipstick SMALL (*) NEGATIVE   Bilirubin Urine NEGATIVE  NEGATIVE   Ketones, ur 15 (*) NEGATIVE mg/dL   Protein, ur NEGATIVE  NEGATIVE mg/dL   Urobilinogen, UA 0.2  0.0 - 1.0 mg/dL  Nitrite NEGATIVE  NEGATIVE   Leukocytes, UA NEGATIVE  NEGATIVE  URINE MICROSCOPIC-ADD ON     Status: Abnormal   Collection Time    11/24/12  9:10 AM      Result Value Range   Squamous Epithelial / LPF RARE  RARE   WBC, UA 0-2  <3 WBC/hpf   RBC / HPF 0-2  <3 RBC/hpf   Bacteria, UA FEW (*) RARE   US Abdomen Complete  11/24/2012   CLINICAL DATA:  Abdomen and chest pain.   EXAM: ULTRASOUND ABDOMEN COMPLETE  COMPARISON:  None.  FINDINGS: Gallbladder  Non mobile gallstones in the neck of the gallbladder. There is wall thickening measuring 4 mm. Gallbladder is distended. Negative sonographic Murphy's. Ring down artifact noted from the anterior gallbladder wall suggesting adenomyomatosis.  Common bile duct  Diameter: Normal caliber, 3 mm.  Liver  No focal lesion identified. Within normal limits in parenchymal echogenicity.  IVC  No abnormality visualized.  Pancreas  Visualized portion unremarkable.  Spleen  Size and appearance within normal limits.  Right Kidney  Length: 11.0 cm. Echogenicity within normal limits. No mass or hydronephrosis visualized.  Left Kidney  Length: 10.8 cm. Echogenicity within normal limits. No mass or hydronephrosis visualized.  Abdominal aorta  No aneurysm visualized.  IMPRESSION: Cholelithiasis. Gallbladder distention and gallbladder wall thickening suggest possibility of acute or chronic cholecystitis (no sonographic Murphy sign at this time).  Adenomyomatosis.   Electronically Signed   By: Charlett Nose M.D.   On: 11/24/2012 11:04       Assessment/Plan 1. Acute cholecystitis with cholelithiasis 2. CAD, h/o MI in 2008 3. Polymyalgia rheumatica, on prednisone daily 4. Hypothyroidism  Plan: 1. We will admit the patient and plan for lap chole today.  She will be given a dose of abx on call to the OR.  She is NPO. She denies any cardiac symptoms.   I will d/w Dr. Derrell Lolling to see if he thinks she needs a stress dose of steroids pre-operatively given her daily use of prednisone.  We will plan for OR today.  I have discussed the procedure along with risks, complications, and expected outcome with the patient and her husband.  Due to her sedation from her medications, I have asked the husband to co-sign her consent form.   Jadea Shiffer E 11/24/2012, 1:48 PM Pager: [phone]

## 2012-11-24 NOTE — Op Note (Signed)
Pre Operative Diagnosis: acute cholecystitis  Post Operative Diagnosis: same  Surgeon: Dr. Axel Filler   Procedure: lap chole   Assistant: none  Anesthesia: Gen. Endotracheal anesthesia   EBL: 10cc  Complications:  Counts: reported as correct x 2   Findings: The patient had an acutely inflammed gallbladder.  Indications for procedure: 62 y/o F with several days h/o abd pain.  US showed acutely inflammed gallbladder  Details of the procedure:  The patient was taken to the operating and placed in the supine position with bilateral SCDs in place. A time out was called and all facts were verified. A pneumoperitoneum was obtained via A Veress needle technique to a pressure of 14mm of mercury. A 5mm trochar was then placed in the right upper quadrant under visualization, and there were no injuries to any abdominal organs. A 11 mm port was then placed in the umbilical region after infiltrating with local anesthesia under direct visualization. A second and third epigastric port and right lower quadrant port placement under direct visualization, respectively. The gallbladder was identified and retracted, the peritoneum was then sharply dissected from the gallbladder and this dissection was carried down to Calot's triangle. The gallbladder was identified and stripped away circumferentially and seen going into the gallbladder 360, the critical angle was obtained.  The cystic artery was identified and 2 clips placed proximally and one distally and transected.  We then proceeded to remove the gallbladder off the hepatic fossa with Bovie cautery. A latex retrieval bag was then placed in the abdomen and gallbladder placed in the bag. The hepatic fossa was then reexamined and hemostasis was achieved with Bovie cautery and was excellent at the end of the case. The subhepatic fossa and perihepatic fossa was then irrigated until the effluent was clear. The 11 mm trocar fascia was reapproximated with the  Endo Close #1 Vicryl x2.  The pneumoperitoneum was evacuated and all trochars removed under direct visulalization.  The skin was then closed with 4-0 Monocryl and the skin dressed with Steri-Strips, gauze, and tape.  The patient was awaken from general anesthesia and taken to the recovery room in stable condition.

## 2012-11-24 NOTE — Anesthesia Preprocedure Evaluation (Addendum)
Anesthesia Evaluation  Patient identified by MRN, date of birth, ID band Patient awake    Reviewed: Allergy & Precautions, H&P , NPO status , Patient's Chart, lab work & pertinent test results  History of Anesthesia Complications Negative for: history of anesthetic complications  Airway Mallampati: IV TM Distance: >3 FB   Mouth opening: Limited Mouth Opening  Dental  (+) Teeth Intact and Dental Advisory Given   Pulmonary neg pulmonary ROS,  breath sounds clear to auscultation  Pulmonary exam normal       Cardiovascular + CAD (single vessel disease, s/p PTCA, EF 60%) and + Past MI Rhythm:Regular Rate:Normal     Neuro/Psych negative neurological ROS     GI/Hepatic Neg liver ROS, N/v with acute chole   Endo/Other  Hypothyroidism   Renal/GU negative Renal ROS     Musculoskeletal   Abdominal   Peds  Hematology   Anesthesia Other Findings   Reproductive/Obstetrics                          Anesthesia Physical Anesthesia Plan  ASA: III  Anesthesia Plan: General   Post-op Pain Management:    Induction: Intravenous and Rapid sequence  Airway Management Planned: Video Laryngoscope Planned and Oral ETT  Additional Equipment:   Intra-op Plan:   Post-operative Plan: Extubation in OR  Informed Consent: I have reviewed the patients History and Physical, chart, labs and discussed the procedure including the risks, benefits and alternatives for the proposed anesthesia with the patient or authorized representative who has indicated his/her understanding and acceptance.   Dental advisory given and Consent reviewed with POA  Plan Discussed with: CRNA and Surgeon  Anesthesia Plan Comments: (Plan routine monitors, GETA )        Anesthesia Quick Evaluation

## 2012-11-24 NOTE — ED Notes (Signed)
Patient transported to Ultrasound 

## 2012-11-24 NOTE — Anesthesia Postprocedure Evaluation (Signed)
Anesthesia Post Note  Patient: [name]  Procedure(s) Performed: Procedure(s) (LRB): LAPAROSCOPIC CHOLECYSTECTOMY (N/A)  Anesthesia type: general  Patient location: PACU  Post pain: Pain level controlled  Post assessment: Patient's Cardiovascular Status Stable  Last Vitals:  Filed Vitals:   11/24/12 1815  BP: 146/68  Pulse: 65  Temp:   Resp: 18    Post vital signs: Reviewed and stable  Level of consciousness: sedated  Complications: No apparent anesthesia complications

## 2012-11-24 NOTE — Transfer of Care (Signed)
Immediate Anesthesia Transfer of Care Note  Patient: [name]  Procedure(s) Performed: Procedure(s): LAPAROSCOPIC CHOLECYSTECTOMY (N/A)  Patient Location: PACU  Anesthesia Type:General  Level of Consciousness: sedated and responds to stimulation  Airway & Oxygen Therapy: Patient Spontanous Breathing and Patient connected to face mask oxygen  Post-op Assessment: Report given to PACU RN and Post -op Vital signs reviewed and stable  Post vital signs: Reviewed and stable  Complications: No apparent anesthesia complications

## 2012-11-24 NOTE — ED Notes (Signed)
Report received, assumed care.  

## 2012-11-24 NOTE — ED Notes (Addendum)
Jettie Pagan, PA at bedside.

## 2012-11-24 NOTE — H&P (Signed)
I have seen and examined the pt and agree with PA-Osbornes's H&P note. Acute cholecystitis All risks and benefits were discussed with the patient to generally include: infection, bleeding, possible need for post op ERCP, damage to the bile ducts, and bile leak. Alternatives were offered and described.  All questions were answered and the patient voiced understanding of the procedure and wishes to proceed at this point with a laparoscopic cholecystectomy  To OR for lap chole

## 2012-11-24 NOTE — ED Notes (Signed)
Pt to go to Short Stay room 34

## 2012-11-24 NOTE — ED Notes (Signed)
The pt has been vomiting since 2000 and she has pain in her rt lat abd.  No bloody urine

## 2012-11-24 NOTE — Anesthesia Procedure Notes (Signed)
Procedure Name: Intubation Date/Time: 11/24/2012 3:48 PM Performed by: Whitman Hero Pre-anesthesia Checklist: Patient identified, Timeout performed, Emergency Drugs available, Suction available and Patient being monitored Patient Re-evaluated:Patient Re-evaluated prior to inductionOxygen Delivery Method: Circle system utilized Preoxygenation: Pre-oxygenation with 100% oxygen Intubation Type: IV induction Ventilation: Mask ventilation without difficulty Laryngoscope Size: Mac and 3 Grade View: Grade I Tube type: Oral Tube size: 7.5 mm Number of attempts: 1 Airway Equipment and Method: Video-laryngoscopy Placement Confirmation: ETT inserted through vocal cords under direct vision,  positive ETCO2 and breath sounds checked- equal and bilateral Secured at: 22 cm Tube secured with: Tape Dental Injury: Teeth and Oropharynx as per pre-operative assessment  Difficulty Due To: Difficulty was anticipated and Difficult Airway- due to limited oral opening Future Recommendations: Recommend- induction with short-acting agent, and alternative techniques readily available

## 2012-11-24 NOTE — ED Provider Notes (Signed)
CSN: [id number]     Arrival date & time 11/24/12  1478 History   First MD Initiated Contact with Patient 11/24/12 [phone]     Chief Complaint  Patient presents with  . Emesis   (Consider location/radiation/quality/duration/timing/severity/associated sxs/prior Treatment) HPI Comments: Patient is a 62 year old female with a past medical history of previous MI, CAD, depression, and hyperthyroidism who presents with abdominal pain since last night. The pain is located in her epigastrium and RUQ and does not radiate. The pain is described as sharp and severe. The pain started gradually and progressively worsened since the onset. No alleviating/aggravating factors. The patient has tried nothing for symptoms without relief. Associated symptoms include nausea and vomiting. Patient denies fever, headache, diarrhea, chest pain, SOB, dysuria, constipation, abnormal vaginal bleeding/discharge.     Patient is a 62 y.o. female presenting with vomiting.  Emesis Associated symptoms: abdominal pain     Past Medical History  Diagnosis Date  . Depression   . Myocardial infarction   . CAD (coronary artery disease)   . Hyperthyroidism    Past Surgical History  Procedure Laterality Date  . Ptca     Family History  Problem Relation Age of Onset  . Hypertension Father    History  Substance Use Topics  . Smoking status: Never Smoker   . Smokeless tobacco: Not on file  . Alcohol Use: No   OB History   Grav Para Term Preterm Abortions TAB SAB Ect Mult Living                 Review of Systems  Gastrointestinal: Positive for nausea, vomiting and abdominal pain.  All other systems reviewed and are negative.    Allergies  Prochlorperazine and Simvastatin  Home Medications   Current Outpatient Rx  Name  Route  Sig  Dispense  Refill  . aspirin 81 MG EC tablet   Oral   Take 81 mg by mouth daily.           . ciprofloxacin (CIPRO) 250 MG tablet   Oral   Take 1 tablet (250 mg total) by mouth  2 (two) times daily.   6 tablet   0   . levothyroxine (SYNTHROID, LEVOTHROID) 125 MCG tablet      Take one tablet by mouth one time daily   30 tablet   5   . PARoxetine (PAXIL) 20 MG tablet      TAKE ONE TABLET BY MOUTH EVERY MORNING    30 tablet   5   . Pitavastatin Calcium (LIVALO) 2 MG TABS   Oral   Take 1 tablet (2 mg total) by mouth daily.   30 tablet   3   . predniSONE (DELTASONE) 5 MG tablet      1 tablet daily   30 tablet   0   . Vitamin D, Ergocalciferol, (DRISDOL) 50000 UNITS CAPS   Oral   Take 1 capsule (50,000 Units total) by mouth every 7 (seven) days.   4 capsule   2    BP 144/86  Pulse 58  Temp(Src) 97.6 F (36.4 C) (Oral)  Resp 20  SpO2 100% Physical Exam  Nursing note and vitals reviewed. Constitutional: She is oriented to person, place, and time. She appears well-developed and well-nourished. No distress.  HENT:  Head: Normocephalic and atraumatic.  Eyes: Conjunctivae and EOM are normal. Pupils are equal, round, and reactive to light. No scleral icterus.  Neck: Normal range of motion.  Cardiovascular: Normal rate and  regular rhythm.  Exam reveals no gallop and no friction rub.   No murmur heard. Pulmonary/Chest: Effort normal and breath sounds normal. She has no wheezes. She has no rales. She exhibits no tenderness.  Abdominal: Soft. She exhibits no distension. There is tenderness. There is no rebound and no guarding.  Epigastric and RUQ tenderness to palpation. No peritoneal signs or other focal tenderness to palpation.   Musculoskeletal: Normal range of motion.  Neurological: She is alert and oriented to person, place, and time. Coordination normal.  Speech is goal-oriented. Moves limbs without ataxia.   Skin: Skin is warm and dry.  Psychiatric: She has a normal mood and affect. Her behavior is normal.    ED Course  Procedures (including critical care time) Labs Review Labs Reviewed  CBC WITH DIFFERENTIAL - Abnormal; Notable for the  following:    WBC 12.5 (*)    Neutrophils Relative % 88 (*)    Neutro Abs 11.0 (*)    Lymphocytes Relative 6 (*)    All other components within normal limits  COMPREHENSIVE METABOLIC PANEL - Abnormal; Notable for the following:    Potassium 3.3 (*)    Glucose, Bld 121 (*)    GFR calc non Af Amer 88 (*)    All other components within normal limits  URINALYSIS, ROUTINE W REFLEX MICROSCOPIC - Abnormal; Notable for the following:    Hgb urine dipstick SMALL (*)    Ketones, ur 15 (*)    All other components within normal limits  URINE MICROSCOPIC-ADD ON - Abnormal; Notable for the following:    Bacteria, UA FEW (*)    All other components within normal limits  URINE CULTURE  LIPASE, BLOOD  POCT I-STAT TROPONIN I   Imaging Review US Abdomen Complete  11/24/2012   CLINICAL DATA:  Abdomen and chest pain.  EXAM: ULTRASOUND ABDOMEN COMPLETE  COMPARISON:  None.  FINDINGS: Gallbladder  Non mobile gallstones in the neck of the gallbladder. There is wall thickening measuring 4 mm. Gallbladder is distended. Negative sonographic Murphy's. Ring down artifact noted from the anterior gallbladder wall suggesting adenomyomatosis.  Common bile duct  Diameter: Normal caliber, 3 mm.  Liver  No focal lesion identified. Within normal limits in parenchymal echogenicity.  IVC  No abnormality visualized.  Pancreas  Visualized portion unremarkable.  Spleen  Size and appearance within normal limits.  Right Kidney  Length: 11.0 cm. Echogenicity within normal limits. No mass or hydronephrosis visualized.  Left Kidney  Length: 10.8 cm. Echogenicity within normal limits. No mass or hydronephrosis visualized.  Abdominal aorta  No aneurysm visualized.  IMPRESSION: Cholelithiasis. Gallbladder distention and gallbladder wall thickening suggest possibility of acute or chronic cholecystitis (no sonographic Murphy sign at this time).  Adenomyomatosis.   Electronically Signed   By: Charlett Nose M.D.   On: 11/24/2012 11:04    EKG  Interpretation   None       MDM  No diagnosis found.  6:55 AM Labs and urinalysis pending. Vitals stable and patient afebrile. Patient will have morphine, phenergan and fluids for symptoms.   Patient has WBC elevation at 12.5. US shows acute cholecystitis. General Surgery will see the patient.     Emilia Beck, PA-C 11/24/12 1550

## 2012-11-25 LAB — URINE CULTURE
Colony Count: >=100000
Special Requests: NORMAL

## 2012-11-25 MED ORDER — OXYCODONE-ACETAMINOPHEN 5-325 MG PO TABS: 1.0000 | ORAL_TABLET | Freq: Four times a day (QID) | ORAL | Status: DC | PRN

## 2012-11-25 NOTE — ED Provider Notes (Signed)
Medical screening examination/treatment/procedure(s) were performed by non-physician practitioner and as supervising physician I was immediately available for consultation/collaboration.   Julie Manly, MD 11/25/12 0750 

## 2012-11-25 NOTE — Discharge Summary (Signed)
Physician Discharge Summary  Patient ID: [name] MRN: [id number] DOB/AGE: September 11, 1950 63 y.o.  Admit date: 11/24/2012 Discharge date: 11/25/2012  Admitting Diagnosis: Acute cholecystitis  Discharge Diagnosis Patient Active Problem List   Diagnosis Date Noted  . Acute calculous cholecystitis 11/24/2012  . Dehydration 02/23/2012  . Gastroenteritis 02/23/2012  . Fever 02/23/2012  . URTI (acute upper respiratory infection) 02/23/2012  . Polymyalgia rheumatica 12/01/2010  . MYOCARDIAL INFARCTION, HX OF 09/09/2007  . CORONARY ARTERY DISEASE 09/09/2007  . HEMATURIA 09/09/2007  . DEPRESSION 03/20/2007  . DISORDER, MENOPAUSAL NOS 03/20/2007  . Hypothyroid 02/13/1999    Consultants None  Imaging: US Abdomen Complete  11/24/2012   CLINICAL DATA:  Abdomen and chest pain.  EXAM: ULTRASOUND ABDOMEN COMPLETE  COMPARISON:  None.  FINDINGS: Gallbladder  Non mobile gallstones in the neck of the gallbladder. There is wall thickening measuring 4 mm. Gallbladder is distended. Negative sonographic Murphy's. Ring down artifact noted from the anterior gallbladder wall suggesting adenomyomatosis.  Common bile duct  Diameter: Normal caliber, 3 mm.  Liver  No focal lesion identified. Within normal limits in parenchymal echogenicity.  IVC  No abnormality visualized.  Pancreas  Visualized portion unremarkable.  Spleen  Size and appearance within normal limits.  Right Kidney  Length: 11.0 cm. Echogenicity within normal limits. No mass or hydronephrosis visualized.  Left Kidney  Length: 10.8 cm. Echogenicity within normal limits. No mass or hydronephrosis visualized.  Abdominal aorta  No aneurysm visualized.  IMPRESSION: Cholelithiasis. Gallbladder distention and gallbladder wall thickening suggest possibility of acute or chronic cholecystitis (no sonographic Murphy sign at this time).  Adenomyomatosis.   Electronically Signed   By: Charlett Nose M.D.   On: 11/24/2012 11:04    Procedures Dr. Derrell Lolling  (11/24/12) - Laparoscopic Cholecystectomy  Hospital Course:  62 yo female who had an MI in 2008 and has been cleared by her cardiologist for no further follow up, who began having RUQ abdominal pain with nausea and vomiting last night around 7:30pm. 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 Her pain as well as emesis persisted. She denies fevers or chills. She denies any chest pain, or chest pain on exertion, SOB. She presented to the [facility] due to the above symptoms and was found to have gallbladder wall thickening with a stone lodged in the neck of the gallbladder on ultrasound.   Patient was admitted and underwent procedure listed above.  Tolerated procedure well and was transferred to the floor.  Diet was advanced as tolerated.  On POD#1, the patient was voiding well, tolerating diet, ambulating well, pain well controlled, vital signs stable, incisions c/d/i and felt stable for discharge home.  Patient will follow up in our office in 3 weeks and knows to call with questions or concerns.  Physical Exam: General:  Alert, NAD, pleasant, comfortable Abd:  Soft, ND, mild tenderness, incisions C/D/I    Medication List         aspirin 81 MG EC tablet  Take 81 mg by mouth daily.     levothyroxine 125 MCG tablet  Commonly known as:  SYNTHROID, LEVOTHROID  Take 125 mcg by mouth daily before breakfast.     oxyCODONE-acetaminophen 5-325 MG per tablet  Commonly known as:  PERCOCET/ROXICET  Take 1-2 tablets by mouth every 6 (six) hours as needed.     PARoxetine 20 MG  tablet  Commonly known as:  PAXIL  Take 20 mg by mouth at bedtime.     predniSONE 5 MG tablet  Commonly known as:  DELTASONE  Take 5 mg by mouth daily.     Vitamin D (Ergocalciferol) 50000 UNITS Caps capsule  Commonly known as:  DRISDOL  Take 50,000 Units by mouth every 7 (seven) days.  Takes on Monday             Follow-up Information   Follow up with [facility] On 12/16/2012. (Your appointment is at 3:30pm, please arrive at least 30 minutes before your appointment to complete your check in paperwork.  If you are unable to arrive 30 minutes prior to your appointment time we may have to cancel or reschedule you.)    Contact information:   [address]   [place] [address] [phone]       Signed: [facility] [phone]  11/25/2012, 7:26 AM

## 2012-11-25 NOTE — Progress Notes (Signed)
DC instructions gone over with patient, questions answered, verbalized understanding.  Rx for pain medication given to patient.  Patient taken to front entrance via wheelchair to be transported home by husband.

## 2012-11-26 ENCOUNTER — Encounter (HOSPITAL_COMMUNITY): Payer: Self-pay | Admitting: General Surgery

## 2012-11-27 ENCOUNTER — Other Ambulatory Visit: Payer: Self-pay | Admitting: *Deleted

## 2012-11-27 MED ORDER — CIPROFLOXACIN HCL 250 MG PO TABS: 250.0000 mg | ORAL_TABLET | Freq: Two times a day (BID) | ORAL | Status: DC

## 2012-12-02 ENCOUNTER — Encounter (INDEPENDENT_AMBULATORY_CARE_PROVIDER_SITE_OTHER): Payer: Self-pay

## 2012-12-16 ENCOUNTER — Encounter (INDEPENDENT_AMBULATORY_CARE_PROVIDER_SITE_OTHER): Payer: No Typology Code available for payment source

## 2012-12-23 ENCOUNTER — Encounter (INDEPENDENT_AMBULATORY_CARE_PROVIDER_SITE_OTHER): Payer: Self-pay | Admitting: General Surgery

## 2012-12-23 ENCOUNTER — Other Ambulatory Visit (HOSPITAL_COMMUNITY): Payer: Self-pay | Admitting: Internal Medicine

## 2012-12-23 ENCOUNTER — Ambulatory Visit (INDEPENDENT_AMBULATORY_CARE_PROVIDER_SITE_OTHER): Payer: No Typology Code available for payment source | Admitting: General Surgery

## 2012-12-23 VITALS — BP 126/72 | HR 87 | Temp 98.0°F | Resp 18 | Ht 61.5 in | Wt 161.0 lb

## 2012-12-23 DIAGNOSIS — K8000 Calculus of gallbladder with acute cholecystitis without obstruction: Secondary | ICD-10-CM

## 2012-12-23 NOTE — Patient Instructions (Signed)
Call if you have any problems 

## 2012-12-23 NOTE — Progress Notes (Signed)
[name] November 16, 1950 [id number] 12/23/2012   [name] is a 62 y.o. female who had a laparoscopic cholecystectomy with intraoperative cholangiogram by Dr. Axel Filler, MD.  The pathology report confirmed Gallbladder - ACUTE AND CHRONIC CHOLECYSTITIS. - SINGLE CHOLELITH PRESENT. - NO TUMOR SEEN..  The patient reports that they are feeling well with normal bowel movements and good appetite.  The pre-operative symptoms of abdominal pain, nausea, and vomiting have resolved.    Physical examination: BP 126/72  Pulse 87  Temp(Src) 98 F (36.7 C)  Resp 18  Ht 5' 1.5" (1.562 m)  Wt 73.029 kg (161 lb)  BMI 29.93 kg/m2  - Incisions appear well-healed with no sign of infection or bleeding.   Abdomen - soft, non-tender  Impression:  s/p laparoscopic cholecystectomy  Plan:  She may resume a regular diet and full activity.  She may follow-up on a PRN basis.

## 2012-12-24 ENCOUNTER — Telehealth: Payer: Self-pay | Admitting: Internal Medicine

## 2012-12-24 MED ORDER — PREDNISONE 5 MG PO TABS: 5.0000 mg | ORAL_TABLET | Freq: Every day | ORAL | Status: DC

## 2012-12-24 NOTE — Telephone Encounter (Signed)
Pt needs refill on prednisone. Pt has appt sch for [phone]. Pt would like a callback at wk #. [facility]

## 2012-12-24 NOTE — Telephone Encounter (Signed)
rx sent in electronically 

## 2013-01-30 ENCOUNTER — Ambulatory Visit (INDEPENDENT_AMBULATORY_CARE_PROVIDER_SITE_OTHER): Payer: No Typology Code available for payment source | Admitting: Internal Medicine

## 2013-01-30 ENCOUNTER — Encounter: Payer: Self-pay | Admitting: Internal Medicine

## 2013-01-30 VITALS — BP 124/76 | Temp 97.9°F | Ht 61.5 in | Wt 165.0 lb

## 2013-01-30 DIAGNOSIS — M353 Polymyalgia rheumatica: Secondary | ICD-10-CM

## 2013-01-30 MED ORDER — ALENDRONATE SODIUM 70 MG PO TABS: 70.0000 mg | ORAL_TABLET | ORAL | Status: DC

## 2013-01-30 MED ORDER — PREDNISONE 5 MG PO TABS: 10.0000 mg | ORAL_TABLET | Freq: Every day | ORAL | Status: DC

## 2013-01-30 NOTE — Patient Instructions (Signed)
Prednisone 10, 10, 7.5 (repeat) for 3 weeks, then alternate 10 and 7.5 mg until you see me

## 2013-01-30 NOTE — Progress Notes (Signed)
Pre visit review using our clinic review tool, if applicable. No additional management support is needed unless otherwise documented below in the visit note. 

## 2013-01-30 NOTE — Progress Notes (Signed)
pmr- trouble tapering prednisone. Has been on 10 mg for 3 months!  Recent cholecytectomy  Hypothyroid Lab Results  Component Value Date   TSH 0.92 06/06/2012

## 2013-02-01 ENCOUNTER — Encounter: Payer: Self-pay | Admitting: Internal Medicine

## 2013-02-01 NOTE — Assessment & Plan Note (Signed)
Discussed at length Will slowly taper prednisone- see patient instructions See me 6-8 weeks

## 2013-02-10 ENCOUNTER — Ambulatory Visit: Payer: No Typology Code available for payment source | Admitting: Internal Medicine

## 2013-03-26 ENCOUNTER — Other Ambulatory Visit: Payer: Self-pay | Admitting: Internal Medicine

## 2013-03-28 ENCOUNTER — Other Ambulatory Visit: Payer: Self-pay | Admitting: Nurse Practitioner

## 2013-03-28 ENCOUNTER — Telehealth: Payer: Self-pay | Admitting: *Deleted

## 2013-03-28 DIAGNOSIS — J111 Influenza due to unidentified influenza virus with other respiratory manifestations: Secondary | ICD-10-CM

## 2013-03-28 MED ORDER — OSELTAMIVIR PHOSPHATE 75 MG PO CAPS: 75.0000 mg | ORAL_CAPSULE | Freq: Two times a day (BID) | ORAL | Status: DC

## 2013-03-28 NOTE — Telephone Encounter (Signed)
Please advise 

## 2013-03-28 NOTE — Telephone Encounter (Signed)
Patient called and stated that she is a patient of dr Leanne Chang. She works at [employer] and she did a flu test on her, which came out positive.  Patient wanted to see if one of the doctors that are on call will call her a medication in without being seen.   Pharmacy [id number] - [place], [place] [address]

## 2013-03-28 NOTE — Telephone Encounter (Signed)
Pt.notified

## 2013-03-28 NOTE — Telephone Encounter (Signed)
Yes I will prescribe, PLs notify pt.

## 2013-03-28 NOTE — Progress Notes (Signed)
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 Test was pos. Will treat w/tamiflu.

## 2013-03-30 ENCOUNTER — Telehealth: Payer: Self-pay | Admitting: Internal Medicine

## 2013-03-30 NOTE — Telephone Encounter (Signed)
Pt states she called in on Saturday requesting a rx for strep throat and she sent someone to pick up her prescription and tamiful is what was called in instead. Pt states the rx was $60 and she is not sure what to do, because the wrong rx was called in.

## 2013-03-30 NOTE — Telephone Encounter (Signed)
Telephone note on Saturday says flu test positive.  Called pt she stated it wasn't flu test, it was a strep test.  She works at [employer] and did a strep test there.

## 2013-04-01 MED ORDER — AMOXICILLIN 500 MG PO CAPS: 500.0000 mg | ORAL_CAPSULE | Freq: Three times a day (TID) | ORAL | Status: DC

## 2013-04-01 NOTE — Telephone Encounter (Signed)
Amoxil 500 mg po tid for 10 days

## 2013-04-01 NOTE — Telephone Encounter (Signed)
Left message for pt to call back  °

## 2013-04-02 NOTE — Telephone Encounter (Signed)
Pt aware.

## 2013-04-13 ENCOUNTER — Ambulatory Visit: Payer: No Typology Code available for payment source | Admitting: Family Medicine

## 2013-04-15 ENCOUNTER — Ambulatory Visit: Payer: No Typology Code available for payment source | Admitting: Family Medicine

## 2013-04-15 ENCOUNTER — Ambulatory Visit (INDEPENDENT_AMBULATORY_CARE_PROVIDER_SITE_OTHER): Payer: No Typology Code available for payment source | Admitting: Family Medicine

## 2013-04-15 ENCOUNTER — Encounter: Payer: Self-pay | Admitting: Family Medicine

## 2013-04-15 VITALS — BP 130/90 | HR 78 | Temp 98.2°F | Wt 166.0 lb

## 2013-04-15 DIAGNOSIS — R059 Cough, unspecified: Secondary | ICD-10-CM

## 2013-04-15 DIAGNOSIS — K219 Gastro-esophageal reflux disease without esophagitis: Secondary | ICD-10-CM

## 2013-04-15 DIAGNOSIS — R05 Cough: Secondary | ICD-10-CM

## 2013-04-15 MED ORDER — PANTOPRAZOLE SODIUM 40 MG PO TBEC: 40.0000 mg | DELAYED_RELEASE_TABLET | Freq: Every day | ORAL | Status: DC

## 2013-04-15 NOTE — Progress Notes (Signed)
Pre visit review using our clinic review tool, if applicable. No additional management support is needed unless otherwise documented below in the visit note. 

## 2013-04-15 NOTE — Progress Notes (Signed)
   Subjective:    Patient ID: [name], female    DOB: [birth year], 63 y.o.   MRN: [id number]  Cough Pertinent negatives include no chills, fever, postnasal drip, rhinorrhea or sore throat.   Patient seen for acute visit. She's had dry cough about 4 weeks. Nonsmoker. Generally feels fairly well. She has had some intermittent reflux symptoms. She tried some mentholcough drops which seemed to make her cough worse. She has not had any fever. No chills. No appetite or weight changes. She is nonsmoker. No postnasal drip symptoms. No wheezing. No ACE inhibitor use.  Past Medical History  Diagnosis Date  . Depression   . Myocardial infarction   . CAD (coronary artery disease)   . Polymyalgia rheumatica   . Hypothyroidism   . MYOCARDIAL INFARCTION, HX OF 09/09/2007    Qualifier: Diagnosis of  By: Leanne Chang MD, Bruce     Past Surgical History  Procedure Laterality Date  . Ptca    . Abdominal hysterectomy    . Eye surgery    . Cholecystectomy N/A 11/24/2012    Procedure: LAPAROSCOPIC CHOLECYSTECTOMY;  Surgeon: Ralene Ok, MD;  Location: [facility];  Service: General;  Laterality: N/A;    reports that she has never smoked. She does not have any smokeless tobacco history on file. She reports that she does not drink alcohol or use illicit drugs. family history includes Hypertension in her father. Allergies  Allergen Reactions  . Prochlorperazine     REACTION: nerve reaction  . Simvastatin     REACTION: leg cramps  . Sulfa Antibiotics       Review of Systems  Constitutional: Negative for fever, chills, appetite change and unexpected weight change.  HENT: Negative for congestion, postnasal drip, rhinorrhea and sore throat.   Respiratory: Positive for cough.   Gastrointestinal: Negative for abdominal pain.       Objective:   Physical Exam  Constitutional: She appears well-developed and well-nourished.  HENT:  Right Ear: External ear normal.  Left Ear: External ear normal.    Mouth/Throat: Oropharynx is clear and moist.  Neck: Neck supple.  Cardiovascular: Normal rate and regular rhythm.   Pulmonary/Chest: Effort normal and breath sounds normal. No respiratory distress. She has no wheezes. She has no rales.  Lymphadenopathy:    She has no cervical adenopathy.          Assessment & Plan:  Persistent cough. Differential is GERD related versus post viral bronchitis. Elevate head of bed 6-8 inches. Reflux factors discussed. Start protonix 40 mg once daily. Avoid mentholated cough drops. Touch base 2 weeks if cough not improving

## 2013-04-15 NOTE — Patient Instructions (Signed)
Gastroesophageal Reflux Disease, Adult Gastroesophageal reflux disease (GERD) happens when acid from your stomach flows up into the esophagus. When acid comes in contact with the esophagus, the acid causes soreness (inflammation) in the esophagus. Over time, GERD may create small holes (ulcers) in the lining of the esophagus. CAUSES   Increased body weight. This puts pressure on the stomach, making acid rise from the stomach into the esophagus.  Smoking. This increases acid production in the stomach.  Drinking alcohol. This causes decreased pressure in the lower esophageal sphincter (valve or ring of muscle between the esophagus and stomach), allowing acid from the stomach into the esophagus.  Late evening meals and a full stomach. This increases pressure and acid production in the stomach.  A malformed lower esophageal sphincter. Sometimes, no cause is found. SYMPTOMS   Burning pain in the lower part of the mid-chest behind the breastbone and in the mid-stomach area. This may occur twice a week or more often.  Trouble swallowing.  Sore throat.  Dry cough.  Asthma-like symptoms including chest tightness, shortness of breath, or wheezing. DIAGNOSIS  Your caregiver may be able to diagnose GERD based on your symptoms. In some cases, X-rays and other tests may be done to check for complications or to check the condition of your stomach and esophagus. TREATMENT  Your caregiver may recommend over-the-counter or prescription medicines to help decrease acid production. Ask your caregiver before starting or adding any new medicines.  HOME CARE INSTRUCTIONS   Change the factors that you can control. Ask your caregiver for guidance concerning weight loss, quitting smoking, and alcohol consumption.  Avoid foods and drinks that make your symptoms worse, such as:  Caffeine or alcoholic drinks.  Chocolate.  Peppermint or mint flavorings.  Garlic and onions.  Spicy foods.  Citrus fruits,  such as oranges, lemons, or limes.  Tomato-based foods such as sauce, chili, salsa, and pizza.  Fried and fatty foods.  Avoid lying down for the 3 hours prior to your bedtime or prior to taking a nap.  Eat small, frequent meals instead of large meals.  Wear loose-fitting clothing. Do not wear anything tight around your waist that causes pressure on your stomach.  Raise the head of your bed 6 to 8 inches with wood blocks to help you sleep. Extra pillows will not help.  Only take over-the-counter or prescription medicines for pain, discomfort, or fever as directed by your caregiver.  Do not take aspirin, ibuprofen, or other nonsteroidal anti-inflammatory drugs (NSAIDs). SEEK IMMEDIATE MEDICAL CARE IF:   You have pain in your arms, neck, jaw, teeth, or back.  Your pain increases or changes in intensity or duration.  You develop nausea, vomiting, or sweating (diaphoresis).  You develop shortness of breath, or you faint.  Your vomit is green, yellow, black, or looks like coffee grounds or blood.  Your stool is red, bloody, or black. These symptoms could be signs of other problems, such as heart disease, gastric bleeding, or esophageal bleeding. MAKE SURE YOU:   Understand these instructions.  Will watch your condition.  Will get help right away if you are not doing well or get worse. Document Released: 11/08/2004 Document Revised: 04/23/2011 Document Reviewed: 08/18/2010 [facility] Patient Information 2014 [place], [place].  FOLLOW UP IN 2 WEEKS IF NO BETTER. Elevated head of bed 6-8 inches.

## 2013-04-29 ENCOUNTER — Other Ambulatory Visit: Payer: Self-pay | Admitting: Internal Medicine

## 2013-05-07 ENCOUNTER — Encounter: Payer: Self-pay | Admitting: Internal Medicine

## 2013-08-07 ENCOUNTER — Other Ambulatory Visit (INDEPENDENT_AMBULATORY_CARE_PROVIDER_SITE_OTHER): Payer: No Typology Code available for payment source

## 2013-08-07 DIAGNOSIS — Z0000 Encounter for general adult medical examination without abnormal findings: Secondary | ICD-10-CM

## 2013-08-07 LAB — BASIC METABOLIC PANEL
BUN: 13 mg/dL (ref 6–23)
CALCIUM: 9.2 mg/dL (ref 8.4–10.5)
CO2: 26 mEq/L (ref 19–32)
Chloride: 108 mEq/L (ref 96–112)
Creatinine, Ser: 0.8 mg/dL (ref 0.4–1.2)
GFR: 81.44 mL/min (ref >60.00)
Glucose, Bld: 99 mg/dL (ref 70–99)
Potassium: 3.9 mEq/L (ref 3.5–5.1)
Sodium: 141 mEq/L (ref 135–145)

## 2013-08-07 LAB — CBC WITH DIFFERENTIAL/PLATELET
Basophils Absolute: 0 10*3/uL (ref 0.0–0.1)
Basophils Relative: 0.3 % (ref 0.0–3.0)
Eosinophils Absolute: 0.2 10*3/uL (ref 0.0–0.7)
Eosinophils Relative: 2.1 % (ref 0.0–5.0)
HCT: 40.5 % (ref 36.0–46.0)
Hemoglobin: 13.5 g/dL (ref 12.0–15.0)
LYMPHS PCT: 16.5 % (ref 12.0–46.0)
Lymphs Abs: 1.4 10*3/uL (ref 0.7–4.0)
MCHC: 33.3 g/dL (ref 30.0–36.0)
MCV: 89.8 fl (ref 78.0–100.0)
MONO ABS: 0.4 10*3/uL (ref 0.1–1.0)
Monocytes Relative: 5.1 % (ref 3.0–12.0)
NEUTROS PCT: 76 % (ref 43.0–77.0)
Neutro Abs: 6.4 10*3/uL (ref 1.4–7.7)
PLATELETS: 290 10*3/uL (ref 150.0–400.0)
RBC: 4.51 Mil/uL (ref 3.87–5.11)
RDW: 14 % (ref 11.5–15.5)
WBC: 8.4 10*3/uL (ref 4.0–10.5)

## 2013-08-07 LAB — POCT URINALYSIS DIPSTICK
BILIRUBIN UA: NEGATIVE
Glucose, UA: NEGATIVE
KETONES UA: NEGATIVE
LEUKOCYTES UA: NEGATIVE
Nitrite, UA: POSITIVE
PROTEIN UA: NEGATIVE
SPEC GRAV UA: 1.02
Urobilinogen, UA: 0.2
pH, UA: 6.5

## 2013-08-07 LAB — HEPATIC FUNCTION PANEL
ALK PHOS: 89 U/L (ref 39–117)
ALT: 26 U/L (ref 0–35)
AST: 26 U/L (ref 0–37)
Albumin: 3.8 g/dL (ref 3.5–5.2)
BILIRUBIN DIRECT: 0 mg/dL (ref 0.0–0.3)
BILIRUBIN TOTAL: 0.6 mg/dL (ref 0.2–1.2)
TOTAL PROTEIN: 7.2 g/dL (ref 6.0–8.3)

## 2013-08-07 LAB — LIPID PANEL
Cholesterol: 246 mg/dL — ABNORMAL HIGH (ref 0–200)
HDL: 64.8 mg/dL (ref >39.00)
LDL CALC: 170 mg/dL — AB (ref 0–99)
NonHDL: 181.2
Total CHOL/HDL Ratio: 4
Triglycerides: 57 mg/dL (ref 0.0–149.0)
VLDL: 11.4 mg/dL (ref 0.0–40.0)

## 2013-08-07 LAB — TSH: TSH: 0.26 u[IU]/mL — ABNORMAL LOW (ref 0.35–4.50)

## 2013-08-17 ENCOUNTER — Encounter: Payer: Self-pay | Admitting: Family

## 2013-08-17 ENCOUNTER — Ambulatory Visit (INDEPENDENT_AMBULATORY_CARE_PROVIDER_SITE_OTHER): Payer: No Typology Code available for payment source | Admitting: Family

## 2013-08-17 VITALS — BP 123/88 | HR 87 | Temp 98.3°F | Ht 61.5 in | Wt 169.0 lb

## 2013-08-17 DIAGNOSIS — F3289 Other specified depressive episodes: Secondary | ICD-10-CM

## 2013-08-17 DIAGNOSIS — E039 Hypothyroidism, unspecified: Secondary | ICD-10-CM

## 2013-08-17 DIAGNOSIS — F329 Major depressive disorder, single episode, unspecified: Secondary | ICD-10-CM

## 2013-08-17 DIAGNOSIS — F32A Depression, unspecified: Secondary | ICD-10-CM

## 2013-08-17 DIAGNOSIS — M353 Polymyalgia rheumatica: Secondary | ICD-10-CM

## 2013-08-17 DIAGNOSIS — E785 Hyperlipidemia, unspecified: Secondary | ICD-10-CM

## 2013-08-17 MED ORDER — SIMVASTATIN 20 MG PO TABS: ORAL_TABLET | ORAL | Status: DC

## 2013-08-17 MED ORDER — PREDNISONE 5 MG PO TABS: 10.0000 mg | ORAL_TABLET | Freq: Every day | ORAL | Status: DC

## 2013-08-17 MED ORDER — LEVOTHYROXINE SODIUM 100 MCG PO TABS: 100.0000 ug | ORAL_TABLET | Freq: Every day | ORAL | Status: DC

## 2013-08-17 NOTE — Patient Instructions (Signed)
Exercise to Stay Healthy Exercise helps you become and stay healthy. EXERCISE IDEAS AND TIPS Choose exercises that:  You enjoy.  Fit into your day. You do not need to exercise really hard to be healthy. You can do exercises at a slow or medium level and stay healthy. You can:  Stretch before and after working out.  Try yoga, Pilates, or tai chi.  Lift weights.  Walk fast, swim, jog, run, climb stairs, bicycle, dance, or rollerskate.  Take aerobic classes. Exercises that burn about 150 calories:  Running 1  miles in 15 minutes.  Playing volleyball for 45 to 60 minutes.  Washing and waxing a car for 45 to 60 minutes.  Playing touch football for 45 minutes.  Walking 1  miles in 35 minutes.  Pushing a stroller 1  miles in 30 minutes.  Playing basketball for 30 minutes.  Raking leaves for 30 minutes.  Bicycling 5 miles in 30 minutes.  Walking 2 miles in 30 minutes.  Dancing for 30 minutes.  Shoveling snow for 15 minutes.  Swimming laps for 20 minutes.  Walking up stairs for 15 minutes.  Bicycling 4 miles in 15 minutes.  Gardening for 30 to 45 minutes.  Jumping rope for 15 minutes.  Washing windows or floors for 45 to 60 minutes. Document Released: 03/03/2010 Document Revised: 04/23/2011 Document Reviewed: 03/03/2010 Exit[employer] Patient Information 2015 [employer]. This information is not intended to replace advice given to you by your health care provider. Make sure you discuss any questions you have with your health care provider.  

## 2013-08-17 NOTE — Progress Notes (Signed)
Subjective:    Patient ID: [name], female    DOB: 1950/10/04, 63 y.o.   MRN: [id number]  HPI 63 y.o. White female presents today for a yearly physical.  I reviewed all health maintenance protocols including mammography, colonoscopy, bone density Needed referrals were placed. Age and diagnosis  appropriate screening labs were ordered. Pt has hx of hyperlipidemia and labs show that her cholesterol and LDL are still elevated. Her TSH is also low currently. Her immunization history was reviewed and appropriate vaccinations were ordered. Her current medications and allergies were reviewed and needed refills of her chronic medications were ordered. The plan for yearly health maintenance was discussed all orders and referrals were made as appropriate. Pt denies fever, fatigue, chills and change in appetite.     Review of Systems  Constitutional: Negative.   HENT: Negative.   Eyes: Negative.   Respiratory: Negative.   Cardiovascular: Negative.   Gastrointestinal: Negative.   Endocrine: Negative.   Genitourinary: Negative.   Musculoskeletal:       Acknowledges bilateral lower leg pain when not taking prednisone. Currently well controlled.   Skin: Negative.   Allergic/Immunologic: Negative.   Neurological: Negative.   Hematological: Negative.   Psychiatric/Behavioral: Negative.    Past Medical History  Diagnosis Date  . Depression   . Myocardial infarction   . CAD (coronary artery disease)   . Polymyalgia rheumatica   . Hypothyroidism   . MYOCARDIAL INFARCTION, HX OF 09/09/2007    Qualifier: Diagnosis of  By: Leanne Chang MD, [name]      History   Social History  . Marital Status: Married    Spouse Name: N/A    Number of Children: N/A  . Years of Education: N/A   Occupational History  . Not on file.   Social History Main Topics  . Smoking status: Never Smoker   . Smokeless tobacco: Not on file  . Alcohol Use: No  . Drug Use: No  . Sexual Activity: Not on file   Other  Topics Concern  . Not on file   Social History Narrative  . No narrative on file    Past Surgical History  Procedure Laterality Date  . Ptca    . Abdominal hysterectomy    . Eye surgery    . Cholecystectomy N/A 11/24/2012    Procedure: LAPAROSCOPIC CHOLECYSTECTOMY;  Surgeon: Ralene Ok, MD;  Location: [facility] OR;  Service: General;  Laterality: N/A;    Family History  Problem Relation Age of Onset  . Hypertension Father     Allergies  Allergen Reactions  . Prochlorperazine     REACTION: nerve reaction  . Simvastatin     REACTION: leg cramps  . Sulfa Antibiotics     Current Outpatient Prescriptions on File Prior to Visit  Medication Sig Dispense Refill  . alendronate (FOSAMAX) 70 MG tablet Take 1 tablet (70 mg total) by mouth every 7 (seven) days. Take in morning on empty stomach with full glass of water. Stay upright. May eat one hour after taking  4 tablet  11  . aspirin 81 MG EC tablet Take 81 mg by mouth daily.       . pantoprazole (PROTONIX) 40 MG tablet Take 1 tablet (40 mg total) by mouth daily.  30 tablet  3  . PARoxetine (PAXIL) 20 MG tablet TAKE ONE TABLET BY MOUTH EVERY MORNING   30 tablet  4  . Vitamin D, Ergocalciferol, (DRISDOL) 50000 UNITS CAPS capsule Take 50,000 Units by mouth every  7 (seven) days. Takes on Monday       No current facility-administered medications on file prior to visit.    BP 123/88  Pulse 87  Temp(Src) 98.3 F (36.8 C) (Oral)  Ht 5' 1.5" (1.562 m)  Wt 169 lb (76.658 kg)  BMI 31.42 kg/m2  SpO2 94%chart    Objective:   Physical Exam  Constitutional: She is oriented to person, place, and time. She appears well-developed and well-nourished. She is active.  HENT:  Head: Normocephalic.  Right Ear: Tympanic membrane, external ear and ear canal normal.  Left Ear: Tympanic membrane, external ear and ear canal normal.  Nose: Nose normal.  Mouth/Throat: Uvula is midline, oropharynx is clear and moist and mucous membranes are normal.    Eyes: Conjunctivae, EOM and lids are normal. Pupils are equal, round, and reactive to light.  Neck: Trachea normal, normal range of motion and full passive range of motion without pain. Neck supple.  Cardiovascular: Normal rate, regular rhythm, normal heart sounds and normal pulses.   Pulmonary/Chest: Effort normal and breath sounds normal.  Abdominal: Soft. Normal appearance and bowel sounds are normal.  Musculoskeletal: Normal range of motion.  Neurological: She is alert and oriented to person, place, and time. She has normal strength and normal reflexes. She displays a negative Romberg sign.  Skin: Skin is warm, dry and intact.  Psychiatric: She has a normal mood and affect. Her speech is normal and behavior is normal. Judgment and thought content normal. Cognition and memory are normal.          Assessment & Plan:  [name] was seen today for no specified reason.  Diagnoses and associated orders for this visit:  Hyperlipidemia  Unspecified hypothyroidism  Depression  Polymyalgia rheumatica  Other Orders - levothyroxine (SYNTHROID, LEVOTHROID) 100 MCG tablet; Take 1 tablet (100 mcg total) by mouth daily. - predniSONE (DELTASONE) 5 MG tablet; Take 2 tablets (10 mg total) by mouth daily. Or as idrected - simvastatin (ZOCOR) 20 MG tablet; Take one pill twice per week   Education: Will take zocor 20mg  2x per week since pt is not able to tolerate daily statins and failed trials of Livalo. Synthroid decreased, hypothyroid symptoms discussed in detail. Exercise 5x per week, 30 minutes per day.   Follow up: 6 weeks for lipid recheck and thyroid recheck.

## 2013-08-17 NOTE — Progress Notes (Signed)
Pre visit review using our clinic review tool, if applicable. No additional management support is needed unless otherwise documented below in the visit note. 

## 2013-08-31 ENCOUNTER — Other Ambulatory Visit: Payer: Self-pay | Admitting: Family Medicine

## 2013-09-23 ENCOUNTER — Other Ambulatory Visit (HOSPITAL_COMMUNITY): Payer: Self-pay | Admitting: Orthopaedic Surgery

## 2013-09-23 DIAGNOSIS — M545 Low back pain: Secondary | ICD-10-CM

## 2013-09-29 ENCOUNTER — Ambulatory Visit (HOSPITAL_COMMUNITY)
Admission: RE | Admit: 2013-09-29 | Discharge: 2013-09-29 | Disposition: A | Discharge status: Home / Self Care | Payer: No Typology Code available for payment source | Source: Ambulatory Visit | Attending: Orthopaedic Surgery | Admitting: Orthopaedic Surgery

## 2013-09-29 DIAGNOSIS — M545 Low back pain, unspecified: Secondary | ICD-10-CM | POA: Insufficient documentation

## 2013-10-07 ENCOUNTER — Other Ambulatory Visit: Payer: Self-pay | Admitting: Internal Medicine

## 2013-11-02 ENCOUNTER — Telehealth: Payer: Self-pay | Admitting: Internal Medicine

## 2013-11-02 DIAGNOSIS — M353 Polymyalgia rheumatica: Secondary | ICD-10-CM

## 2013-11-02 NOTE — Telephone Encounter (Signed)
Pt would like a referral to [facility] please. No specific Md. Referral phone number is [phone].

## 2013-11-02 NOTE — Telephone Encounter (Signed)
Referral placed.

## 2013-12-10 ENCOUNTER — Telehealth: Payer: Self-pay | Admitting: Internal Medicine

## 2013-12-10 MED ORDER — PENICILLIN V POTASSIUM 500 MG PO TABS: 500.0000 mg | ORAL_TABLET | Freq: Three times a day (TID) | ORAL | Status: DC

## 2013-12-10 NOTE — Telephone Encounter (Signed)
Per Dr. Yong Channel pt needs to fax over documentation that her strep test was positive.  If there is no documentatoin pt needs to be seen. Left a message for return call.

## 2013-12-10 NOTE — Telephone Encounter (Signed)
Patient Information:  Caller Name: [name]  Phone: [phone]  Patient: Meghan, [name]  Gender: Female  DOB: 1950/06/18  Age: 63 Years  PCP: [facility])  Office Follow Up:  Does the office need to follow up with this patient?: Yes  Instructions For The Office: Patient requesting antibioitic for +strep test at [facility].  [place] [phone] Please contact  RN Note:  Patient requesting antibioitic for +strep test at [facility].  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  She states a positive strep test.  Sore throat+, no fever.  Requesting antibiotic.  she is aware of office policy.  Reviewed Health History In EMR: Yes  Reviewed Medications In EMR: Yes  Reviewed Allergies In EMR: Yes  Reviewed Surgeries / Procedures: Yes  Date of Onset of Symptoms: 12/10/2013  Guideline(s) Used:  Sore Throat  Disposition Per Guideline:   Strep Test Only Visit Today or Tomorrow  Reason For Disposition Reached:   Strep exposure within last 10 days  Advice Given:  Pain Medicines:  For pain relief, you can take either acetaminophen, ibuprofen, or naproxen.  They are over-the-counter (OTC) pain drugs. You can buy them at the drugstore.  Acetaminophen (e.g., Tylenol):  Regular Strength Tylenol: Take 650 mg (two 325 mg pills) by mouth every 4-6 hours as needed. Each Regular Strength Tylenol pill has 325 mg of acetaminophen.  Ibuprofen (e.g., Motrin, Advil):  Take 400 mg (two 200 mg pills) by mouth every 6 hours.  For Relief of Sore Throat Pain:  Sip warm chicken broth or apple juice.  Suck on hard candy or a throat lozenge (over-the-counter).  Gargle warm salt water 3 times daily (1 teaspoon of salt in 8 oz or 240 ml of warm water).  Avoid cigarette smoke.  Soft Diet:   Cold drinks and milk shakes are especially good  (Reason: swollen tonsils can make some foods hard to swallow).  Call Back If:  Fever lasts longer than 3 days  You become worse.  RN Overrode Recommendation:  Patient Requests Prescription  Patient requesting antibioitic for +strep test at [facility].  Pharmacy [facility] [phone] Please contact

## 2013-12-10 NOTE — Telephone Encounter (Signed)
Left a detailed message for pt on personalized voicemail.

## 2013-12-10 NOTE — Telephone Encounter (Signed)
Documentation received and given to Dr. Yong Channel. Pls advise.

## 2013-12-10 NOTE — Telephone Encounter (Signed)
Called in Penicillin x 10 days. Please inform patient. If no resolution of symptoms or any other concerns, will need to come in for follow up visit.

## 2013-12-31 ENCOUNTER — Other Ambulatory Visit: Payer: Self-pay | Admitting: Internal Medicine

## 2014-01-27 ENCOUNTER — Other Ambulatory Visit: Payer: Self-pay | Admitting: Internal Medicine

## 2014-01-27 ENCOUNTER — Other Ambulatory Visit: Payer: Self-pay | Admitting: Family

## 2014-02-19 ENCOUNTER — Encounter: Payer: Self-pay | Admitting: Internal Medicine

## 2014-03-15 ENCOUNTER — Ambulatory Visit (INDEPENDENT_AMBULATORY_CARE_PROVIDER_SITE_OTHER): Payer: BLUE CROSS/BLUE SHIELD | Admitting: Family Medicine

## 2014-03-15 ENCOUNTER — Encounter: Payer: Self-pay | Admitting: Family Medicine

## 2014-03-15 VITALS — BP 132/80 | HR 80 | Temp 97.6°F | Wt 158.0 lb

## 2014-03-15 DIAGNOSIS — B9789 Other viral agents as the cause of diseases classified elsewhere: Principal | ICD-10-CM

## 2014-03-15 DIAGNOSIS — J069 Acute upper respiratory infection, unspecified: Secondary | ICD-10-CM

## 2014-03-15 MED ORDER — AMOXICILLIN 875 MG PO TABS: 875.0000 mg | ORAL_TABLET | Freq: Two times a day (BID) | ORAL | Status: DC

## 2014-03-15 NOTE — Progress Notes (Signed)
   Subjective:    Patient ID: [name], female    DOB: [birth year], 64 y.o.   MRN: [id number]  HPI  Acute visit. Patient seen with less than one week history of sinus pressure maxillary region mostly. She's had some headaches often unknown and malaise. Mostly nonproductive cough. She's had some very transient vertigo type symptoms when standing up but inconsistently. Symptoms of been very short-lived and mild. She is taking Mucinex. 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 Depression   . Myocardial infarction   . CAD (coronary artery disease)   . Polymyalgia rheumatica   . Hypothyroidism   . MYOCARDIAL INFARCTION, HX OF 09/09/2007    Qualifier: Diagnosis of  By: Leanne Chang MD, Dashiel Bergquist     Past Surgical History  Procedure Laterality Date  . Ptca    . Abdominal hysterectomy    . Eye surgery    . Cholecystectomy N/A 11/24/2012    Procedure: LAPAROSCOPIC CHOLECYSTECTOMY;  Surgeon: Ralene Ok, MD;  Location: [place];  Service: General;  Laterality: N/A;    reports that she has never smoked. She does not have any smokeless tobacco history on file. She reports that she does not drink alcohol or use illicit drugs. family history includes Hypertension in her father. Allergies  Allergen Reactions  . Prochlorperazine     REACTION: nerve reaction  . Simvastatin     REACTION: leg cramps  . Sulfa Antibiotics      Review of Systems  Constitutional: Positive for fatigue. Negative for fever and chills.  HENT: Positive for congestion and sinus pressure.   Respiratory: Positive for cough.        Objective:   Physical Exam  Constitutional: She appears well-developed and well-nourished.  HENT:  Right Ear: External ear normal.  Left Ear: External ear normal.  Mouth/Throat: Oropharynx is clear and moist.  Neck: Neck supple.  Cardiovascular: Normal rate and regular rhythm.   Pulmonary/Chest: Effort normal and breath  sounds normal. No respiratory distress. She has no wheezes. She has no rales.  Lymphadenopathy:    She has no cervical adenopathy.          Assessment & Plan:   URI with cough.  Suspect this is viral. We've not recommended antibiotics at this point. We did print prescription for amoxicillin to start only if she has progressive facial pain, fever, purulent secretions, etc.

## 2014-04-03 ENCOUNTER — Other Ambulatory Visit: Payer: Self-pay | Admitting: Internal Medicine

## 2014-04-27 LAB — HM MAMMOGRAPHY: HM MAMMO: NORMAL

## 2014-05-04 ENCOUNTER — Encounter: Payer: Self-pay | Admitting: Family Medicine

## 2014-05-04 ENCOUNTER — Other Ambulatory Visit: Payer: Self-pay | Admitting: Family Medicine

## 2014-05-05 ENCOUNTER — Encounter: Payer: Self-pay | Admitting: Internal Medicine

## 2014-05-10 ENCOUNTER — Other Ambulatory Visit: Payer: Self-pay | Admitting: Family Medicine

## 2014-05-10 NOTE — Telephone Encounter (Signed)
Rx filled for 30 day supply. Pt has med follow up with Dr. Yong Channel on 05/21/14

## 2014-05-12 ENCOUNTER — Encounter: Payer: Self-pay | Admitting: Internal Medicine

## 2014-05-21 ENCOUNTER — Ambulatory Visit: Payer: BLUE CROSS/BLUE SHIELD | Admitting: Family Medicine

## 2014-05-25 ENCOUNTER — Other Ambulatory Visit: Payer: Self-pay | Admitting: Orthopaedic Surgery

## 2014-05-25 DIAGNOSIS — M545 Low back pain: Secondary | ICD-10-CM

## 2014-05-26 ENCOUNTER — Ambulatory Visit (INDEPENDENT_AMBULATORY_CARE_PROVIDER_SITE_OTHER): Payer: BLUE CROSS/BLUE SHIELD | Admitting: Family Medicine

## 2014-05-26 ENCOUNTER — Encounter: Payer: Self-pay | Admitting: Family Medicine

## 2014-05-26 VITALS — BP 124/84 | HR 67 | Temp 97.5°F | Wt 167.0 lb

## 2014-05-26 DIAGNOSIS — E559 Vitamin D deficiency, unspecified: Secondary | ICD-10-CM

## 2014-05-26 DIAGNOSIS — I2510 Atherosclerotic heart disease of native coronary artery without angina pectoris: Secondary | ICD-10-CM

## 2014-05-26 DIAGNOSIS — R319 Hematuria, unspecified: Secondary | ICD-10-CM | POA: Diagnosis not present

## 2014-05-26 DIAGNOSIS — E034 Atrophy of thyroid (acquired): Secondary | ICD-10-CM

## 2014-05-26 DIAGNOSIS — M353 Polymyalgia rheumatica: Secondary | ICD-10-CM

## 2014-05-26 DIAGNOSIS — E038 Other specified hypothyroidism: Secondary | ICD-10-CM

## 2014-05-26 DIAGNOSIS — M810 Age-related osteoporosis without current pathological fracture: Secondary | ICD-10-CM

## 2014-05-26 LAB — URINALYSIS, MICROSCOPIC ONLY

## 2014-05-26 LAB — VITAMIN D 25 HYDROXY (VIT D DEFICIENCY, FRACTURES): VITD: 21.78 ng/mL — AB (ref 30.00–100.00)

## 2014-05-26 LAB — TSH: TSH: 0.92 u[IU]/mL (ref 0.35–4.50)

## 2014-05-26 NOTE — Patient Instructions (Addendum)
We will call you within a week about your referral to Dr. Percival Spanish. If you do not hear within 2 weeks, give [place] a call.   Check thyroid today, refill meds if normal range.   Check vit D today, if over 30, no changes, if under 30, may do some boosting with the 50k  Make sure no blood in urine  Continue prednisone 7.5mg  all days but Saturday take 5mg .   Check in 3 months from now and may go down further

## 2014-05-26 NOTE — Assessment & Plan Note (Signed)
Continue fosamax, need to wean down prednisone to off if able.

## 2014-05-26 NOTE — Assessment & Plan Note (Signed)
Check TSH. Refill current rx 159mcg if in normal range, adjust if needed.

## 2014-05-26 NOTE — Progress Notes (Signed)
Garret Reddish, MD Phone: [phone]  Subjective:   [name] is a 64 y.o. year old very pleasant female patient who presents with the following:  Hypothyroidism-over treated in past but patient states was not taking rx appropriately  Lab Results  Component Value Date   TSH 0.26* 08/07/2013   On thyroid medication-levotyroxine 120mcg ROS-No hair or nail changes. No heat/cold intolerance. No constipation or diarrhea. Denies shakiness or anxiety.   CAD-asymptomatic, myalgias on statins many attempts in past, on aspirin  BP Readings from Last 3 Encounters:  05/26/14 124/84  03/15/14 132/80  08/17/13 123/88  Compliant with medications-yes without side effects ROS-Denies any CP, HA, SOB, blurry vision, LE edema.   Polymyalgia rheumatica-controlled Osteoporosis- suspect not worsening but needs to be off prednisone if possible Patient complains of stiffness when trying to go down to 5mg  prednisone daily. Seeing ortho and thinks may be contributed to from back and she has an MRI planned on 30th and plans to have records sent here. States always has been in her legs and not in her shoulders. Currently 7.5mg  prednisone daily.   Also has a history of osteoporosis with worst t score -2.6 last year. Was started on fosamax last year after a fracture by orthopedics. She is taking ca/vitamin D. She is aware of risks of prednisone for bone health ROS- no recent fractures, while on prednisone at current dose does not have muscle weakness and stiffness.   Past Medical History- Patient Active Problem List   Diagnosis Date Noted  . Polymyalgia rheumatica 12/01/2010    Priority: High  . CAD (coronary artery disease) 09/09/2007    Priority: High  . Depression 03/20/2007    Priority: Medium  . Hypothyroid 02/13/1999    Priority: Medium  . Vitamin D deficiency 05/26/2014    Priority: Low  . Hematuria 09/09/2007    Priority: Low  . Osteoporosis 05/26/2014   Medications- reviewed and  updated Current Outpatient Prescriptions  Medication Sig Dispense Refill  . alendronate (FOSAMAX) 70 MG tablet Take 1 tablet (70 mg total) by mouth every 7 (seven) days. Take in morning on empty stomach with full glass of water. Stay upright. May eat one hour after taking 4 tablet 11  . aspirin 81 MG EC tablet Take 81 mg by mouth daily.     Marland Kitchen levothyroxine (SYNTHROID, LEVOTHROID) 100 MCG tablet Take 1 tablet (100 mcg total) by mouth daily. 90 tablet 3  . pantoprazole (PROTONIX) 40 MG tablet TAKE 1 TABLET BY MOUTH EVERY DAY 30 tablet 5  . PARoxetine (PAXIL) 20 MG tablet TAKE ONE TABLET BY MOUTH EVERY MORNING 30 tablet 5  . predniSONE (DELTASONE) 5 MG tablet TAKE TWO TABLETS BY MOUTH EVERY DAY AS DIRECTED 60 tablet 0  . Vitamin D, Ergocalciferol, (DRISDOL) 50000 UNITS CAPS capsule Take 50,000 Units by mouth every 7 (seven) days. Takes on Monday     Objective: BP 124/84 mmHg  Pulse 67  Temp(Src) 97.5 F (36.4 C)  Wt 167 lb (75.751 kg) Gen: NAD, resting comfortably No thyromegaly CV: RRR no murmurs rubs or gallops Lungs: CTAB no crackles, wheeze, rhonchi Abdomen: soft/nontender/nondistended/normal bowel sounds.  Ext: no edema Skin: warm, dry, no rash    Assessment/Plan:  CAD (coronary artery disease) On aspirin. Statin intolerant despite multiple multiple attempts per patient due to myalgias and muscle weakness. Has not seen Dr. Percival Spanish in years. We decided to refer back to get his opinion on injectables for her lipids.     Hypothyroid Check TSH. Refill  current rx 176mcg if in normal range, adjust if needed.    Polymyalgia rheumatica Continue to titrate off of prednisone. From 7.5mg  daily, decrease to 7.5mg  everyday but Saturday when she will take 5mg . 3 month follow up and consider 5mg  Saturday and Sunday at that time. Await MRI results to see if back contributing. Also discussed considering rheumatology.    Hematuria Trace on last dipstick-check microscopic   Vitamin D  deficiency Check vitamin D, consider 50k supplementation if <30 with osteoporosis history   Osteoporosis Continue fosamax, need to wean down prednisone to off if able.     Return precautions advised. Call in thyroid medication based on labs.  Orders Placed This Encounter  Procedures  . TSH    [place]  . Vit D  25 hydroxy (rtn osteoporosis monitoring)      . Urine Microscopic Only  . Ambulatory referral to Cardiology    Referral Priority:  Routine    Referral Type:  Consultation    Referral Reason:  Specialty Services Required    Referred to Provider:  Minus Breeding, MD    Requested Specialty:  Cardiology    Number of Visits Requested:  1

## 2014-05-26 NOTE — Assessment & Plan Note (Signed)
Check vitamin D, consider 50k supplementation if <30 with osteoporosis history

## 2014-05-26 NOTE — Assessment & Plan Note (Signed)
On aspirin. Statin intolerant despite multiple multiple attempts per patient due to myalgias and muscle weakness. Has not seen Dr. Percival Spanish in years. We decided to refer back to get his opinion on injectables for her lipids.

## 2014-05-26 NOTE — Assessment & Plan Note (Signed)
Trace on last dipstick-check microscopic

## 2014-05-26 NOTE — Assessment & Plan Note (Signed)
Continue to titrate off of prednisone. From 7.5mg  daily, decrease to 7.5mg  everyday but Saturday when she will take 5mg . 3 month follow up and consider 5mg  Saturday and Sunday at that time. Await MRI results to see if back contributing. Also discussed considering rheumatology.

## 2014-05-27 ENCOUNTER — Other Ambulatory Visit: Payer: Self-pay | Admitting: Family Medicine

## 2014-05-27 ENCOUNTER — Other Ambulatory Visit: Payer: Self-pay

## 2014-05-27 MED ORDER — CHOLECALCIFEROL 1.25 MG (50000 UT) PO CAPS: 50000.0000 [IU] | ORAL_CAPSULE | ORAL | Status: DC

## 2014-06-12 ENCOUNTER — Ambulatory Visit
Admission: RE | Admit: 2014-06-12 | Discharge: 2014-06-12 | Disposition: A | Discharge status: Home / Self Care | Payer: BLUE CROSS/BLUE SHIELD | Source: Ambulatory Visit | Attending: Orthopaedic Surgery | Admitting: Orthopaedic Surgery

## 2014-06-12 DIAGNOSIS — M545 Low back pain: Secondary | ICD-10-CM

## 2014-06-22 ENCOUNTER — Other Ambulatory Visit: Payer: Self-pay | Admitting: Internal Medicine

## 2014-06-25 ENCOUNTER — Ambulatory Visit: Payer: BLUE CROSS/BLUE SHIELD | Admitting: Family Medicine

## 2014-07-06 ENCOUNTER — Ambulatory Visit (AMBULATORY_SURGERY_CENTER): Payer: Self-pay | Admitting: *Deleted

## 2014-07-06 ENCOUNTER — Ambulatory Visit: Payer: BLUE CROSS/BLUE SHIELD | Admitting: Cardiology

## 2014-07-06 ENCOUNTER — Telehealth: Payer: Self-pay | Admitting: Family Medicine

## 2014-07-06 ENCOUNTER — Telehealth: Payer: Self-pay | Admitting: *Deleted

## 2014-07-06 VITALS — Ht 61.5 in | Wt 172.0 lb

## 2014-07-06 DIAGNOSIS — Z1211 Encounter for screening for malignant neoplasm of colon: Secondary | ICD-10-CM

## 2014-07-06 MED ORDER — ONDANSETRON HCL 4 MG PO TABS: 4.0000 mg | ORAL_TABLET | Freq: Three times a day (TID) | ORAL | Status: DC | PRN

## 2014-07-06 NOTE — Telephone Encounter (Signed)
Dr. Olevia Perches,  Ms. Rozak states that she has real difficulties with nausea and vomiting. She is requesting a prescription for Zofran to use while taking her prep. Please advise.  Thank you, Olivia Mackie

## 2014-07-06 NOTE — Telephone Encounter (Signed)
Pt had seen md on 4-13 and was told to taper off prednisone. Pt needs another [facility]

## 2014-07-06 NOTE — Telephone Encounter (Signed)
Called and notified patient of prescription being sent in to [facility] for Zofran as directed by Dr. Olevia Perches.

## 2014-07-06 NOTE — Progress Notes (Signed)
Denies allergies to eggs or soy products. Denies complications with sedation or anesthesia. Denies O2 use. Denies use of diet or weight loss medications.  Emmi instructions given for colonoscopy.  

## 2014-07-06 NOTE — Telephone Encounter (Signed)
Yea may refill and have her continue current dose of 7.5mg  every day of the week but 5mg  on Sunday

## 2014-07-06 NOTE — Telephone Encounter (Signed)
This was what you said in your OV note Continue to titrate off of prednisone. From 7.5mg  daily, decrease to 7.5mg  everyday but Saturday when she will take 5mg . 3 month follow up and consider 5mg  Saturday and Sunday at that time. Await MRI results to see if back contributing. Also discussed considering rheumatology. Ok to refill or wait to pt schedules 3 month f/u?

## 2014-07-06 NOTE — Telephone Encounter (Signed)
Zofran 4 mg, #10, 1 po q 6-8 hours prn nausea, no refill.

## 2014-07-07 ENCOUNTER — Encounter: Payer: Self-pay | Admitting: Internal Medicine

## 2014-07-07 MED ORDER — PREDNISONE 5 MG PO TABS: ORAL_TABLET | ORAL | Status: DC

## 2014-07-07 NOTE — Telephone Encounter (Signed)
Medication refilled and pt notified.

## 2014-07-23 ENCOUNTER — Ambulatory Visit (AMBULATORY_SURGERY_CENTER): Payer: BLUE CROSS/BLUE SHIELD | Admitting: Internal Medicine

## 2014-07-23 ENCOUNTER — Encounter: Payer: Self-pay | Admitting: Internal Medicine

## 2014-07-23 VITALS — BP 136/85 | HR 66 | Temp 97.0°F | Resp 18 | Ht 61.0 in | Wt 172.0 lb

## 2014-07-23 DIAGNOSIS — D125 Benign neoplasm of sigmoid colon: Secondary | ICD-10-CM

## 2014-07-23 DIAGNOSIS — Z1211 Encounter for screening for malignant neoplasm of colon: Secondary | ICD-10-CM

## 2014-07-23 MED ORDER — SODIUM CHLORIDE 0.9 % IV SOLN: 500.0000 mL | INTRAVENOUS | Status: DC

## 2014-07-23 NOTE — Progress Notes (Signed)
Called to room to assist during endoscopic procedure.  Patient ID and intended procedure confirmed with present staff. Received instructions for my participation in the procedure from the performing physician.  

## 2014-07-23 NOTE — Patient Instructions (Signed)
YOU HAD AN ENDOSCOPIC PROCEDURE TODAY AT [facility]:   Refer to the procedure report that was given to you for any specific questions about what was found during the examination.  If the procedure report does not answer your questions, please call your gastroenterologist to clarify.  If you requested that your care partner not be given the details of your procedure findings, then the procedure report has been included in a sealed envelope for you to review at your convenience later.  YOU SHOULD EXPECT: Some feelings of bloating in the abdomen. Passage of more gas than usual.  Walking can help get rid of the air that was put into your GI tract during the procedure and reduce the bloating. If you had a lower endoscopy (such as a colonoscopy or flexible sigmoidoscopy) you may notice spotting of blood in your stool or on the toilet paper. If you underwent a bowel prep for your procedure, you may not have a normal bowel movement for a few days.  Please Note:  You might notice some irritation and congestion in your nose or some drainage.  This is from the oxygen used during your procedure.  There is no need for concern and it should clear up in a day or so.  SYMPTOMS TO REPORT IMMEDIATELY:   Following lower endoscopy (colonoscopy or flexible sigmoidoscopy):  Excessive amounts of blood in the stool  Significant tenderness or worsening of abdominal pains  Swelling of the abdomen that is new, acute  Fever of 100F or higher   For urgent or emergent issues, a gastroenterologist can be reached at any hour by calling [phone].   DIET: Your first meal following the procedure should be a small meal and then it is ok to progress to your normal diet. Heavy or fried foods are harder to digest and may make you feel nauseous or bloated.  Likewise, meals heavy in dairy and vegetables can increase bloating.  Drink plenty of fluids but you should avoid alcoholic beverages for 24  hours.  ACTIVITY:  You should plan to take it easy for the rest of today and you should NOT DRIVE or use heavy machinery until tomorrow (because of the sedation medicines used during the test).    FOLLOW UP: Our staff will call the number listed on your records the next business day following your procedure to check on you and address any questions or concerns that you may have regarding the information given to you following your procedure. If we do not reach you, we will leave a message.  However, if you are feeling well and you are not experiencing any problems, there is no need to return our call.  We will assume that you have returned to your regular daily activities without incident.  If any biopsies were taken you will be contacted by phone or by letter within the next 1-3 weeks.  Please call us at [phone] if you have not heard about the biopsies in 3 weeks.    SIGNATURES/CONFIDENTIALITY: You and/or your care partner have signed paperwork which will be entered into your electronic medical record.  These signatures attest to the fact that that the information above on your After Visit Summary has been reviewed and is understood.  Full responsibility of the confidentiality of this discharge information lies with you and/or your care-partner.   Information on polyps & high fiber diet given to you today

## 2014-07-23 NOTE — Op Note (Signed)
[place]  [address]. [place], [address]   COLONOSCOPY PROCEDURE REPORT  PATIENT: [name], [name]  MR#: [id number] BIRTHDATE: 1950/09/19 , 63  yrs. old GENDER: female ENDOSCOPIST: Lafayette Dragon, MD REFERRED GT:XMIWO Swords, M.D. PROCEDURE DATE:  07/23/2014 PROCEDURE:   Colonoscopy, screening and Colonoscopy with snare polypectomy First Screening Colonoscopy - Avg.  risk and is 50 yrs.  old or older - No.  Prior Negative Screening - Now for repeat screening. 10 or more years since last screening  History of Adenoma - Now for follow-up colonoscopy & has been > or = to 3 yrs.  N/A  Polyps removed today? Yes ASA CLASS:   Class II INDICATIONS:Screening for colonic neoplasia, Colorectal Neoplasm Risk Assessment for this procedure is average risk, and prior colonoscopy in May 2006 normal.  Positive family history of colon cancer in maternal uncle. MEDICATIONS: Monitored anesthesia care and Propofol 200 mg IV  DESCRIPTION OF PROCEDURE:   After the risks benefits and alternatives of the procedure were thoroughly explained, informed consent was obtained.  The digital rectal exam revealed no abnormalities of the rectum.   The LB [id number]  endoscope was introduced through the anus and advanced to the cecum, which was identified by both the appendix and ileocecal valve. No adverse events experienced.   The quality of the prep was good.  (MoviPrep was used)  The instrument was then slowly withdrawn as the colon was fully examined. Estimated blood loss is zero unless otherwise noted in this procedure report.      COLON FINDINGS: A sessile polyp measuring 7 mm in size was found in the sigmoid colon.  A polypectomy was performed with a cold snare. The resection was complete, the polyp tissue was completely retrieved and sent to histology.  Retroflexed views revealed no abnormalities. The time to cecum = 4.33 Withdrawal time = 8.02 The scope was withdrawn and  the procedure completed. COMPLICATIONS: There were no immediate complications.  ENDOSCOPIC IMPRESSION: Sessile polyp was found in the sigmoid colon; polypectomy was performed with a cold snare  RECOMMENDATIONS: 1.  Await pathology results 2.  High-fiber diet Recall colonoscopy pending path report  eSigned:  Lafayette Dragon, MD 07/23/2014 9:36 AM   cc:

## 2014-07-23 NOTE — Progress Notes (Signed)
Report to PACU, RN, vss, BBS= Clear.  

## 2014-07-26 ENCOUNTER — Telehealth: Payer: Self-pay

## 2014-07-26 NOTE — Telephone Encounter (Signed)
  Follow up Call-  Call back number 07/23/2014  Post procedure Call Back phone  # [phone]  Permission to leave phone message Yes     Patient questions:  Do you have a fever, pain , or abdominal swelling? No. Pain Score  0 *  Have you tolerated food without any problems? Yes.    Have you been able to return to your normal activities? Yes.    Do you have any questions about your discharge instructions: Diet   No. Medications  No. Follow up visit  No.  Do you have questions or concerns about your Care? No.  Actions: * If pain score is 4 or above: No action needed, pain <4.

## 2014-07-27 ENCOUNTER — Encounter: Payer: Self-pay | Admitting: Internal Medicine

## 2014-07-29 ENCOUNTER — Ambulatory Visit: Payer: BLUE CROSS/BLUE SHIELD | Admitting: Cardiology

## 2014-09-08 ENCOUNTER — Telehealth: Payer: Self-pay | Admitting: Family Medicine

## 2014-09-08 NOTE — Telephone Encounter (Signed)
You may fill but she was asked to follow up in 3 months and I would advise her to see me within the last month. She should not abruptly stop medicine- slow wean is medically necessary only under healthcare providers advice

## 2014-09-08 NOTE — Telephone Encounter (Signed)
Pt request refill predniSONE (DELTASONE) 5 MG tablet Pt tried weaning off, but really needs the 5mg / day.  [facility] [place]

## 2014-09-08 NOTE — Telephone Encounter (Signed)
Is this ok?

## 2014-09-09 NOTE — Telephone Encounter (Signed)
See below [name]

## 2014-09-09 NOTE — Telephone Encounter (Signed)
Mrs. [name] please have pt schedule a f/u with Dr. Yong Channel which she never did. I will refill the prednisone but only enough to get her through to the f/u visit.

## 2014-09-10 MED ORDER — PREDNISONE 5 MG PO TABS: 5.0000 mg | ORAL_TABLET | Freq: Every day | ORAL | Status: DC

## 2014-09-10 NOTE — Telephone Encounter (Signed)
Pt has made fu appt on her first day off with is august 29. Can you send in a 30 day rx predniSONE (DELTASONE) 5 MG tablet  [facility]

## 2014-09-10 NOTE — Telephone Encounter (Signed)
Can I use sda slot?

## 2014-09-10 NOTE — Telephone Encounter (Signed)
Yes thanks 

## 2014-09-10 NOTE — Telephone Encounter (Signed)
Refill done.  

## 2014-09-10 NOTE — Telephone Encounter (Signed)
SDA slot ok?

## 2014-09-10 NOTE — Addendum Note (Signed)
Addended by: Clyde Lundborg A on: 09/10/2014 03:13 PM   Modules accepted: Orders

## 2014-09-13 ENCOUNTER — Other Ambulatory Visit: Payer: Self-pay | Admitting: Family

## 2014-09-27 ENCOUNTER — Other Ambulatory Visit: Payer: Self-pay | Admitting: Family Medicine

## 2014-10-11 ENCOUNTER — Encounter: Payer: Self-pay | Admitting: Family Medicine

## 2014-10-11 ENCOUNTER — Ambulatory Visit (INDEPENDENT_AMBULATORY_CARE_PROVIDER_SITE_OTHER): Payer: PPO | Admitting: Family Medicine

## 2014-10-11 VITALS — BP 144/84 | HR 73 | Temp 98.3°F | Wt 165.0 lb

## 2014-10-11 DIAGNOSIS — E034 Atrophy of thyroid (acquired): Secondary | ICD-10-CM

## 2014-10-11 DIAGNOSIS — M353 Polymyalgia rheumatica: Secondary | ICD-10-CM | POA: Diagnosis not present

## 2014-10-11 DIAGNOSIS — R030 Elevated blood-pressure reading, without diagnosis of hypertension: Secondary | ICD-10-CM

## 2014-10-11 DIAGNOSIS — IMO0001 Reserved for inherently not codable concepts without codable children: Secondary | ICD-10-CM

## 2014-10-11 DIAGNOSIS — D126 Benign neoplasm of colon, unspecified: Secondary | ICD-10-CM

## 2014-10-11 DIAGNOSIS — E559 Vitamin D deficiency, unspecified: Secondary | ICD-10-CM

## 2014-10-11 DIAGNOSIS — Z23 Encounter for immunization: Secondary | ICD-10-CM

## 2014-10-11 DIAGNOSIS — E038 Other specified hypothyroidism: Secondary | ICD-10-CM

## 2014-10-11 LAB — VITAMIN D 25 HYDROXY (VIT D DEFICIENCY, FRACTURES): VITD: 21.48 ng/mL — ABNORMAL LOW (ref 30.00–100.00)

## 2014-10-11 LAB — TSH: TSH: 0.79 u[IU]/mL (ref 0.35–4.50)

## 2014-10-11 MED ORDER — PREDNISONE 5 MG PO TABS: 5.0000 mg | ORAL_TABLET | Freq: Every day | ORAL | Status: DC

## 2014-10-11 MED ORDER — CHOLECALCIFEROL 1.25 MG (50000 UT) PO CAPS: 50000.0000 [IU] | ORAL_CAPSULE | ORAL | Status: DC

## 2014-10-11 NOTE — Assessment & Plan Note (Signed)
S: Well controlled. Patient did trial 2.5 mg prednisone daily on her own and later alternating 2.5 and 5 mg daily. Each attenot increased her lower extremity stiffness. She had to return to 5 mg daily. She has done well on this dose. I have not advised such a drastic decrease. Warned patient of adrenal insufficiency and need for slow removal under medical guidance. Patient did have an MRI of her low back showing 2 bulging disks. Ortho Physicians did not think this was related to her stiffness per patient report. A/P:Continue current meds:  5 mg daily. Doing really well may take half tablet once a week and full tablet other days of week. At follow-up visit consider continued gradual reduction.

## 2014-10-11 NOTE — Progress Notes (Addendum)
Garret Reddish, MD  Subjective:  [name] is a 64 y.o. year old very pleasant female patient who presents with:  See problem oriented charting ROS-No hair or nail changes. No heat/cold intolerance. No constipation or diarrhea. Denies shakiness or anxiety.  Leg stiffness improved back on prednisone  Past Medical History-  Patient Active Problem List   Diagnosis Date Noted  . Polymyalgia rheumatica 12/01/2010    Priority: High  . CAD (coronary artery disease) 09/09/2007    Priority: High  . Osteoporosis 05/26/2014    Priority: Medium  . Depression 03/20/2007    Priority: Medium  . Hypothyroid 02/13/1999    Priority: Medium  . Polyp of colon, adenomatous 10/11/2014    Priority: Low  . Vitamin D deficiency 05/26/2014    Priority: Low  . Hematuria 09/09/2007    Priority: Low   Medications- reviewed and updated Current Outpatient Prescriptions  Medication Sig Dispense Refill  . alendronate (FOSAMAX) 70 MG tablet Take 1 tablet (70 mg total) by mouth every 7 (seven) days. Take in morning on empty stomach with full glass of water. Stay upright. May eat one hour after taking 4 tablet 11  . aspirin 81 MG EC tablet Take 81 mg by mouth daily.     Marland Kitchen levothyroxine (SYNTHROID, LEVOTHROID) 100 MCG tablet TAKE ONE TABLET BY MOUTH EVERY DAY 30 tablet 6  . ondansetron (ZOFRAN) 4 MG tablet Take 1 tablet (4 mg total) by mouth every 8 (eight) hours as needed for nausea or vomiting. 10 tablet 0  . pantoprazole (PROTONIX) 40 MG tablet TAKE 1 TABLET BY MOUTH EVERY DAY 30 tablet 6  . PARoxetine (PAXIL) 20 MG tablet TAKE ONE TABLET BY MOUTH EVERY MORNING 30 tablet 5  . predniSONE (DELTASONE) 5 MG tablet Take 1 tablet (5 mg total) by mouth daily. 30 tablet 0   Objective: BP 144/84 mmHg  Pulse 73  Temp(Src) 98.3 F (36.8 C)  Wt 165 lb (74.844 kg) Gen: NAD, resting comfortably No buffalo hump or supraclavicular adiposity CV: RRR no murmurs rubs or gallops Lungs: CTAB no crackles, wheeze,  rhonchi Abdomen: soft/nontender/nondistended/normal bowel sounds.  Ext: no edema Skin: warm, dry Neuro: grossly normal, moves all extremities, slow gait-does appear to have some stiffness   Assessment/Plan:  Polymyalgia rheumatica S: Well controlled. Patient did trial 2.5 mg prednisone daily on her own and later alternating 2.5 and 5 mg daily. Each attenot increased her lower extremity stiffness. She had to return to 5 mg daily. She has done well on this dose. I have not advised such a drastic decrease. Warned patient of adrenal insufficiency and need for slow removal under medical guidance. Patient did have an MRI of her low back showing 2 bulging disks. Ortho Physicians did not think this was related to her stiffness per patient report. A/P:Continue current meds:  5 mg daily. Doing really well may take half tablet once a week and full tablet other days of week. At follow-up visit consider continued gradual reduction.   Hypothyroid S: controlled. On levothyroxine 19mcg A/P:Continue current meds:  Check TSH today    Vitamin D deficiency S: poorly controlled last check with level around 22 with known osteoporosis. Took 8 weeks of 50k vit D A/P:Continue daily meds, check to make sure had adequate replenishment to levels >30.     Elevated BP S: 1st elevation noted in recent history BP Readings from Last 3 Encounters:  10/11/14 144/84  07/23/14 136/85  05/26/14 124/84  A/P: we opted to trend BP  at 3 month follow up. With CAD history really need to remain <140 but suspect she generally is.   Return precautions advised. 3 months.   Orders Placed This Encounter  Procedures  . Pneumococcal conjugate vaccine 13-valent  . TSH    [place]  . Vit D  25 hydroxy (rtn osteoporosis monitoring)    [place]   Meds ordered this encounter  Medications  . predniSONE (DELTASONE) 5 MG tablet    Sig: Take 1 tablet (5 mg total) by mouth daily.    Dispense:  30 tablet    Refill:  5    Vit D  still low: 16 weeks vitamin D, repeat 6 months likely

## 2014-10-11 NOTE — Assessment & Plan Note (Signed)
S: controlled. On levothyroxine 1105mcg A/P:Continue current meds:  Check TSH today

## 2014-10-11 NOTE — Assessment & Plan Note (Signed)
S: poorly controlled last check with level around 22 with known osteoporosis. Took 8 weeks of 50k vit D A/P:Continue daily meds, check to make sure had adequate replenishment to levels >30.

## 2014-10-11 NOTE — Addendum Note (Signed)
Addended by: [name] on: 10/11/2014 05:50 PM   Modules accepted: Orders

## 2014-10-11 NOTE — Patient Instructions (Signed)
Medication Instructions:  Same, see list Stick with 5mg  prednisone over next 3 months. The biggest Step I would want you to take would be to take 5mg  daily except 1/2 tab on Sundays.   Labwork: Vit D and TSH today  Follow-Up (all visit scheduling, rescheduling, cancellations including labs should be scheduled at front desk): 3 months to check in about PMR.   Sooner if you need [place]

## 2014-12-26 ENCOUNTER — Other Ambulatory Visit: Payer: Self-pay | Admitting: Family Medicine

## 2015-01-19 ENCOUNTER — Ambulatory Visit: Payer: PPO | Admitting: Family Medicine

## 2015-02-15 ENCOUNTER — Encounter: Payer: Self-pay | Admitting: Gastroenterology

## 2015-02-16 DIAGNOSIS — L821 Other seborrheic keratosis: Secondary | ICD-10-CM | POA: Diagnosis not present

## 2015-02-28 ENCOUNTER — Other Ambulatory Visit: Payer: Self-pay | Admitting: Family Medicine

## 2015-03-14 ENCOUNTER — Ambulatory Visit: Payer: PPO | Admitting: Gastroenterology

## 2015-04-08 ENCOUNTER — Ambulatory Visit: Payer: PPO | Admitting: Gastroenterology

## 2015-04-11 ENCOUNTER — Other Ambulatory Visit: Payer: Self-pay | Admitting: Family Medicine

## 2015-04-12 ENCOUNTER — Ambulatory Visit (INDEPENDENT_AMBULATORY_CARE_PROVIDER_SITE_OTHER): Payer: PPO | Admitting: Gastroenterology

## 2015-04-12 ENCOUNTER — Encounter: Payer: Self-pay | Admitting: Gastroenterology

## 2015-04-12 VITALS — BP 116/70 | HR 72 | Ht 61.5 in | Wt 163.5 lb

## 2015-04-12 DIAGNOSIS — R1310 Dysphagia, unspecified: Secondary | ICD-10-CM

## 2015-04-12 NOTE — Patient Instructions (Signed)

## 2015-04-12 NOTE — Progress Notes (Signed)
HPI :  65 y/o female seen in consultation for dysphagia. She thinks this symptom has been bothering her for several months. She has dysphagia in the sternal notch after she swallows solid food at times. She also has a constant sore throat after eating. She has dysphagia mostly to solids, and tries to drink water and will not go down, she spits it back up. She has no dysphagia to liquids itself. She has some odynophagia as well with swallows. She denies any problems initiating the swallow and thinks she can get the food past her throat, but then it gets caught up and causes discomfort. She has not eaten meat for months due to it reliably reproducing her symptoms. She denies any heartburn at baseline. She denies any prior EGD. She denies nasal regurgitation with swallows or coughing when swallowing solids. She has had a prior history of an MI 10 years ago, she takes 81mg  aspirin daily for this history. She has been on plavix previously but taken off, no current anticoagulation or antiplatelet therapy. No history of a stroke. She also has a history of polymyalgia rheumatica for which she is on chronic prednisone.   Colonoscopy - 07/2014 - one tubular adenoma, due in 2021    Past Medical History  Diagnosis Date  . Depression   . Myocardial infarction ([place])   . CAD (coronary artery disease)   . Polymyalgia rheumatica ([place])   . Hypothyroidism   . MYOCARDIAL INFARCTION, HX OF 09/09/2007    Qualifier: Diagnosis of  By: Leanne Chang MD, Bruce    . DISORDER, MENOPAUSAL NOS 03/20/2007    Qualifier: Diagnosis of  By: Arnoldo Morale MD, Balinda Quails      Past Surgical History  Procedure Laterality Date  . Ptca    . Eye surgery    . Cholecystectomy N/A 11/24/2012    Procedure: LAPAROSCOPIC CHOLECYSTECTOMY;  Surgeon: Ralene Ok, MD;  Location: [facility] OR;  Service: General;  Laterality: N/A;   Family History  Problem Relation Age of Onset  . Hypertension Father   . Colon cancer Maternal Uncle    Social History    Substance Use Topics  . Smoking status: Never Smoker   . Smokeless tobacco: Never Used  . Alcohol Use: No   Current Outpatient Prescriptions  Medication Sig Dispense Refill  . alendronate (FOSAMAX) 70 MG tablet Take 1 tablet (70 mg total) by mouth every 7 (seven) days. Take in morning on empty stomach with full glass of water. Stay upright. May eat one hour after taking 4 tablet 11  . aspirin 81 MG EC tablet Take 81 mg by mouth daily.     Marland Kitchen levothyroxine (SYNTHROID, LEVOTHROID) 100 MCG tablet TAKE 1 TABLET BY MOUTH EVERY DAY 30 tablet 5  . pantoprazole (PROTONIX) 40 MG tablet TAKE 1 TABLET BY MOUTH EVERY DAY 30 tablet 5  . PARoxetine (PAXIL) 20 MG tablet TAKE 1 TABLET BY MOUTH EVERY MORNING 30 tablet 5  . predniSONE (DELTASONE) 5 MG tablet TAKE 1 TABLET BY MOUTH EVERY DAY 30 tablet 0   No current facility-administered medications for this visit.   Allergies  Allergen Reactions  . Morphine And Related Nausea And Vomiting  . Prochlorperazine     REACTION: nerve reaction  . Simvastatin     REACTION: leg cramps  . Sulfa Antibiotics      Review of Systems: All systems reviewed and negative except where noted in HPI.   Lab Results  Component Value Date   WBC 8.4 08/07/2013  HGB 13.5 08/07/2013   HCT 40.5 08/07/2013   MCV 89.8 08/07/2013   PLT 290.0 08/07/2013    Lab Results  Component Value Date   CREATININE 0.8 08/07/2013   BUN 13 08/07/2013   NA 141 08/07/2013   K 3.9 08/07/2013   CL 108 08/07/2013   CO2 26 08/07/2013    Lab Results  Component Value Date   ALT 26 08/07/2013   AST 26 08/07/2013   ALKPHOS 89 08/07/2013   BILITOT 0.6 08/07/2013     Physical Exam: BP 116/70 mmHg  Pulse 72  Ht 5' 1.5" (1.562 m)  Wt 163 lb 8 oz (74.163 kg)  BMI 30.40 kg/m2 Constitutional: Pleasant,well-developed, female in no acute distress. HEENT: Normocephalic and atraumatic. Conjunctivae are normal. No scleral icterus. Neck supple.  Cardiovascular: Normal rate, regular  rhythm.  Pulmonary/chest: Effort normal and breath sounds normal. No wheezing, rales or rhonchi. Abdominal: Soft, nondistended, nontender. Bowel sounds active throughout. There are no masses palpable. No hepatomegaly. Extremities: no edema Lymphadenopathy: No cervical adenopathy noted. Neurological: Alert and oriented to person place and time. Skin: Skin is warm and dry. No rashes noted. Psychiatric: Normal mood and affect. Behavior is normal.   ASSESSMENT AND PLAN: 65 y/o female with history of CAD and PMR, not on anticoagulation or antiplatelet therapy, presenting with dysphagia to solids for several months duration. I discussed the differential for dysphagia with her and at this time given her symptoms, recommend an EGD to further evaluate and dilate if needed. The indications, risks, and benefits of EGD were explained to the patient in detail. Risks include but are not limited to bleeding, perforation, adverse reaction to medications, and cardiopulmonary compromise. Sequelae include but are not limited to the possibility of surgery, hospitalization, and mortality. The patient verbalized understanding and wished to proceed. All questions answered, referred to scheduler. Further recommendations pending results of the exam.   Hunter Cellar, MD [facility] Pager [phone]

## 2015-04-18 ENCOUNTER — Ambulatory Visit: Payer: PPO | Admitting: Family Medicine

## 2015-04-22 ENCOUNTER — Telehealth: Payer: Self-pay | Admitting: Gastroenterology

## 2015-04-22 ENCOUNTER — Ambulatory Visit (INDEPENDENT_AMBULATORY_CARE_PROVIDER_SITE_OTHER)
Admission: RE | Admit: 2015-04-22 | Discharge: 2015-04-22 | Disposition: A | Discharge status: Home / Self Care | Payer: PPO | Source: Ambulatory Visit | Attending: Gastroenterology | Admitting: Gastroenterology

## 2015-04-22 ENCOUNTER — Encounter: Payer: Self-pay | Admitting: Gastroenterology

## 2015-04-22 ENCOUNTER — Ambulatory Visit (AMBULATORY_SURGERY_CENTER): Payer: PPO | Admitting: Gastroenterology

## 2015-04-22 VITALS — BP 120/72 | HR 69 | Temp 97.8°F | Resp 22 | Ht 61.5 in | Wt 163.0 lb

## 2015-04-22 DIAGNOSIS — I252 Old myocardial infarction: Secondary | ICD-10-CM | POA: Diagnosis not present

## 2015-04-22 DIAGNOSIS — R1310 Dysphagia, unspecified: Secondary | ICD-10-CM | POA: Diagnosis not present

## 2015-04-22 DIAGNOSIS — E039 Hypothyroidism, unspecified: Secondary | ICD-10-CM | POA: Diagnosis not present

## 2015-04-22 DIAGNOSIS — I2510 Atherosclerotic heart disease of native coronary artery without angina pectoris: Secondary | ICD-10-CM | POA: Diagnosis not present

## 2015-04-22 MED ORDER — SODIUM CHLORIDE 0.9 % IV SOLN: 500.0000 mL | INTRAVENOUS | Status: DC

## 2015-04-22 NOTE — Patient Instructions (Signed)
YOU HAD AN ENDOSCOPIC PROCEDURE TODAY AT [facility]:   Refer to the procedure report that was given to you for any specific questions about what was found during the examination.  If the procedure report does not answer your questions, please call your gastroenterologist to clarify.  If you requested that your care partner not be given the details of your procedure findings, then the procedure report has been included in a sealed envelope for you to review at your convenience later.  YOU SHOULD EXPECT: Some feelings of bloating in the abdomen. Passage of more gas than usual.  Walking can help get rid of the air that was put into your GI tract during the procedure and reduce the bloating. If you had a lower endoscopy (such as a colonoscopy or flexible sigmoidoscopy) you may notice spotting of blood in your stool or on the toilet paper. If you underwent a bowel prep for your procedure, you may not have a normal bowel movement for a few days.  Please Note:  You might notice some irritation and congestion in your nose or some drainage.  This is from the oxygen used during your procedure.  There is no need for concern and it should clear up in a day or so.  SYMPTOMS TO REPORT IMMEDIATELY:    Following upper endoscopy (EGD)  Vomiting of blood or coffee ground material  New chest pain or pain under the shoulder blades  Painful or persistently difficult swallowing  New shortness of breath  Fever of 100F or higher  Black, tarry-looking stools  For urgent or emergent issues, a gastroenterologist can be reached at any hour by calling [phone].   DIET: We want you to stay on a liquid diet tonight.  You may have a soft diet for tomorrow, and have a regular diet for Sunday.  Drink plenty of fluids but you should avoid alcoholic beverages for 24 hours.  ACTIVITY:  You should plan to take it easy for the rest of today and you should NOT DRIVE or use heavy machinery until tomorrow  (because of the sedation medicines used during the test).    FOLLOW UP: Our staff will call the number listed on your records the next business day following your procedure to check on you and address any questions or concerns that you may have regarding the information given to you following your procedure. If we do not reach you, we will leave a message.  However, if you are feeling well and you are not experiencing any problems, there is no need to return our call.  We will assume that you have returned to your regular daily activities without incident.  If any biopsies were taken you will be contacted by phone or by letter within the next 1-3 weeks.  Please call us at [phone] if you have not heard about the biopsies in 3 weeks.    SIGNATURES/CONFIDENTIALITY: You and/or your care partner have signed paperwork which will be entered into your electronic medical record.  These signatures attest to the fact that that the information above on your After Visit Summary has been reviewed and is understood.  Full responsibility of the confidentiality of this discharge information lies with you and/or your care-partner.  If you have a lot of chest pain, trouble swallowing or trouble breathing call our number immediately.   Hold your aspirin for today.   Take tylenol only for pain. Do not take ibuprofen nor aleve.  Read all of the  handouts that your recovery room nurse gave you.  Very important~!!  Thank-you for choosing [place] for your healthcare needs today.

## 2015-04-22 NOTE — Op Note (Signed)
[facility] Patient Name: Tamie Geiger Procedure Date: 04/22/2015 3:52 PM MRN: [id number] Endoscopist: Remo Lipps P. Havery Moros , MD Age: 65 Referring MD:  Date of Birth: Mar 07, 1950 Gender: Female Procedure:                Upper GI endoscopy Indications:              Dysphagia Medicines:                Monitored Anesthesia Care Procedure:                Pre-Anesthesia Assessment:                           - Prior to the procedure, a History and Physical                            was performed, and patient medications and                            allergies were reviewed. The patient's tolerance of                            previous anesthesia was also reviewed. The risks                            and benefits of the procedure and the sedation                            options and risks were discussed with the patient.                            All questions were answered, and informed consent                            was obtained. Prior Anticoagulants: The patient has                            taken aspirin, last dose was 1 day prior to                            procedure. ASA Grade Assessment: III - A patient                            with severe systemic disease. After reviewing the                            risks and benefits, the patient was deemed in                            satisfactory condition to undergo the procedure.                           After obtaining informed consent, the endoscope was  passed under direct vision. Throughout the                            procedure, the patient's blood pressure, pulse, and                            oxygen saturations were monitored continuously. The                            Model GIF-[id number]) scope was introduced                            through the mouth, and advanced to the second part                            of duodenum. The upper GI endoscopy was           accomplished without difficulty. The patient                            tolerated the procedure well. Scope In: Scope Out: Findings:      Esophagogastric landmarks were identified: the Z-line was found at 35       cm, the gastroesophageal junction was found at 35 cm and the site of       hiatal narrowing was found at 39 cm from the incisors. 4cm hiatal hernia      One mild benign-appearing, intrinsic stenosis was found (Shatski ring,       at the GEJ). It was widely patent. This measured less than one cm (in       length) and was traversed. A TTS dilator was passed through the scope.       Dilation with an 18 mm balloon dilator was performed with no resistance,       and then to 19 mm with minimal resistance. The dilation site was       examined and showed moderate improvement in luminal narrowing. There was       a moderate to large sized mucosal wrent noted with some oozing which       stopped with observation. Estimated blood loss was minimal.      The exam of the esophagus was otherwise normal.      The entire examined stomach was normal.      The duodenal bulb and second portion of the duodenum were normal. Complications:            The patient had a transient oxygen desaturation                            during the procedure maneged by anesthesia, thought                            to be due to laryngospasm. Estimated blood loss:                            Minimal. Estimated Blood Loss:     Estimated blood loss was minimal. Impression:               -  Esophagogastric landmarks identified. 4cm hiatal                            hernia                           - Benign-appearing esophageal stenosis. Dilated up                            to 67mm via TTS balloon as outlined above                           - Normal stomach.                           - Normal duodenal bulb and second portion of the                            duodenum.                           - No specimens  collected. Recommendation:           - Patient has a contact number available for                            emergencies. The signs and symptoms of potential                            delayed complications were discussed with the                            patient. Return to normal activities tomorrow.                            Written discharge instructions were provided to the                            patient.                           - Soft diet today and tomorrow, then advance as                            tolerated                           - Continue present medications.                           - No ibuprofen, naproxen, or other non-steroidal                            anti-inflammatory drugs for 2 weeks.                           - Repeat upper endoscopy if needed for recurrence  of dysphagia. Procedure Code(s):        --- Professional ---                           [phone], Esophagogastroduodenoscopy, flexible,                            transoral; with transendoscopic balloon dilation of                            esophagus (less than 30 mm diameter) CPT copyright 2016 American Medical Association. All rights reserved. Remo Lipps P. Havery Moros, MD Carlota Raspberry. Armbr[place], MD 04/22/2015 4:36:03 PM This report has been signed electronically. Number of Addenda: 0

## 2015-04-22 NOTE — Progress Notes (Addendum)
Patient states no pain, just a little tired.  No esophageal deviation noted.  No bleeding noted.  Discharged instructions stressed again and again regarding excessive bleeding and pain.  Also suggested diet changes regarding acidity.  Patient and husband gave me good feedback as to what to look for.  Patient is going to have an x-ray of her esophagus to relieve any doubt of perforation.  X-ray to call Dr. Havery Moros, and he will meet the patient in x-ray with result.  She will wait in x-ray with Riki Sheer. LPN for his call.  Patient's husband is with her as well.

## 2015-04-22 NOTE — Telephone Encounter (Signed)
Patient had an EGD this afternoon for dysphagia. There was a Shatski ring at the GEJ which was widely patent. I dilated her using a TTS balloon starting at 6mm with no resistance and then 37mm with mild resistance. A large mucosal wrent was noted at the site after dilation, larger than I anticipated. She awoke from the procedure without any chest pain or discomfort and her vitals were stable. I obtained a CXR post-procedure which was reviewed with radiology - no evidence of free air or perforation. The patient remains asymptomatic. I counseled the patient to stay on a liquid diet the rest of today. Tomorrow she can advance to soft diet if she remains asymptomatic. If she develops chest pain, shortness of breath, fevers, hematemesis etc, she was advised to go to the ER or call the on call physician line.  She should otherwise avoid NSAIDs for 2 weeks. She and husband agreed with the plan.

## 2015-04-22 NOTE — Progress Notes (Signed)
To recovery, report to Hodges, RN, VSS 

## 2015-04-25 ENCOUNTER — Telehealth: Payer: Self-pay | Admitting: *Deleted

## 2015-04-25 NOTE — Telephone Encounter (Signed)
  Follow up Call-  Call back number 04/22/2015 07/23/2014  Post procedure Call Back phone  # [phone]  Permission to leave phone message Yes Yes     Patient questions:  Do you have a fever, pain , or abdominal swelling? No. Pain Score  0 *  Have you tolerated food without any problems? Yes.    Have you been able to return to your normal activities? Yes.    Do you have any questions about your discharge instructions: Diet   No. Medications  No. Follow up visit  No.  Do you have questions or concerns about your Care? No.  Actions: * If pain score is 4 or above: No action needed, pain <4.

## 2015-05-09 ENCOUNTER — Other Ambulatory Visit: Payer: Self-pay | Admitting: Family Medicine

## 2015-05-09 NOTE — Telephone Encounter (Signed)
Yes thanks 

## 2015-05-09 NOTE — Telephone Encounter (Signed)
Refill ok? 

## 2015-05-23 ENCOUNTER — Other Ambulatory Visit: Payer: Self-pay

## 2015-05-23 DIAGNOSIS — Z1231 Encounter for screening mammogram for malignant neoplasm of breast: Secondary | ICD-10-CM

## 2015-05-26 ENCOUNTER — Ambulatory Visit
Admission: RE | Admit: 2015-05-26 | Discharge: 2015-05-26 | Disposition: A | Discharge status: Home / Self Care | Payer: PPO | Source: Ambulatory Visit

## 2015-05-26 DIAGNOSIS — Z1231 Encounter for screening mammogram for malignant neoplasm of breast: Secondary | ICD-10-CM

## 2015-05-30 ENCOUNTER — Telehealth: Payer: Self-pay | Admitting: Family Medicine

## 2015-05-30 NOTE — Telephone Encounter (Signed)
Yes thanks, may repeat vitamin D

## 2015-05-30 NOTE — Telephone Encounter (Signed)
Pt was to follow up for a possible vitamin D lab per Dr Yong Channel notes.  However, this was back in 09/2014. Can pt have a lab for the vitamin D , pr does she need to be seen?

## 2015-05-31 ENCOUNTER — Other Ambulatory Visit: Payer: Self-pay | Admitting: Family Medicine

## 2015-05-31 DIAGNOSIS — E559 Vitamin D deficiency, unspecified: Secondary | ICD-10-CM

## 2015-05-31 NOTE — Telephone Encounter (Signed)
Vitamin D lab ordered.   

## 2015-06-06 ENCOUNTER — Other Ambulatory Visit: Payer: Self-pay | Admitting: Family Medicine

## 2015-06-30 ENCOUNTER — Ambulatory Visit: Payer: PPO | Admitting: Podiatry

## 2015-07-07 ENCOUNTER — Ambulatory Visit (INDEPENDENT_AMBULATORY_CARE_PROVIDER_SITE_OTHER): Payer: PPO | Admitting: Podiatry

## 2015-07-07 ENCOUNTER — Ambulatory Visit (INDEPENDENT_AMBULATORY_CARE_PROVIDER_SITE_OTHER): Payer: PPO

## 2015-07-07 ENCOUNTER — Encounter: Payer: Self-pay | Admitting: Podiatry

## 2015-07-07 VITALS — BP 117/76 | HR 77 | Resp 16 | Ht 61.0 in | Wt 150.0 lb

## 2015-07-07 DIAGNOSIS — M79671 Pain in right foot: Secondary | ICD-10-CM | POA: Diagnosis not present

## 2015-07-07 DIAGNOSIS — M7660 Achilles tendinitis, unspecified leg: Secondary | ICD-10-CM

## 2015-07-07 DIAGNOSIS — M79672 Pain in left foot: Secondary | ICD-10-CM

## 2015-07-07 MED ORDER — DICLOFENAC SODIUM 75 MG PO TBEC
75.0000 mg | DELAYED_RELEASE_TABLET | Freq: Two times a day (BID) | ORAL | Status: DC
Start: 2015-07-07 — End: 2016-01-06

## 2015-07-07 MED ORDER — TRIAMCINOLONE ACETONIDE 10 MG/ML IJ SUSP
10.0000 mg | Freq: Once | INTRAMUSCULAR | Status: AC
  Administered 2015-07-07: 10 mg

## 2015-07-07 NOTE — Patient Instructions (Signed)

## 2015-07-07 NOTE — Progress Notes (Signed)
   Subjective:    Patient ID: [name], female    DOB: 04/07/1950, 65 y.o.   MRN: [id number]  HPI Chief Complaint  Patient presents with  . Foot Pain    Bilateral; back of heel; bottom of entire feet; pt stated, "Feet hurt more when on feet all day"      Review of Systems  Musculoskeletal: Positive for back pain and gait problem.  All other systems reviewed and are negative.      Objective:   Physical Exam        Assessment & Plan:

## 2015-07-07 NOTE — Progress Notes (Signed)
Subjective:     Patient ID: [name], female   DOB: 1950/11/09, 65 y.o.   MRN: [id number]  HPI patient presents stating she's been getting a lot of pain in her feet and the back of her heels and she's not sure where this is coming from. 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      Objective:   Physical Exam  Constitutional: She is oriented to person, place, and time.  Cardiovascular: Intact distal pulses.   Musculoskeletal: Normal range of motion.  Neurological: She is oriented to person, place, and time.  Skin: Skin is warm.  Nursing note and vitals reviewed.  neurovascular status intact muscle strength adequate range of motion within normal limits with patient found to have quite a bit of discomfort mostly in the posterior lateral aspect of the right Achilles tendon with inflammation with mild discomfort on the left Achilles tendon and mild discomfort in the plantar aspect of both feet. The pain seems to be though most posterior right over left patient's found have good digital perfusion and is well oriented 3     Assessment:     Probable acute Achilles tendinitis right over left with compensatory pain besides this but mostly coming from the back of the heel    Plan:     H&P and x-rays reviewed with patient. Today I went ahead and I discussed a careful injection of the lateral side of the right Achilles explaining the procedure and risk associated with it and patient wants this done understanding risk of rupture. I did a careful lateral injection 3 mg dexamethasone Kenalog 5 mg Xylocaine not putting it directly into the tendon to keeping it lateral and I went ahead today and I applied a anklet with silicone padding for the posterior right heel. I will  see back in 2 weeks and we may need to consider injection of the left depending on response and I did place her on a sterile prep DS Dosepak for the next 12 days  Tray report indicates large posterior and plantar spurring bilateral

## 2015-07-18 ENCOUNTER — Encounter: Payer: Self-pay | Admitting: Podiatry

## 2015-07-18 ENCOUNTER — Ambulatory Visit (INDEPENDENT_AMBULATORY_CARE_PROVIDER_SITE_OTHER): Payer: PPO | Admitting: Podiatry

## 2015-07-18 DIAGNOSIS — M7660 Achilles tendinitis, unspecified leg: Secondary | ICD-10-CM | POA: Diagnosis not present

## 2015-07-19 ENCOUNTER — Encounter: Payer: Self-pay | Admitting: Podiatry

## 2015-07-20 NOTE — Progress Notes (Signed)
Subjective:     Patient ID: [name], female   DOB: [birth year], 65 y.o.   MRN: [id number]  HPI patient states I'm still having a lot of pain in my heel and I'm having trouble walking with it but I was good for a few days after you worked on   Review of Systems     Objective:   Physical Exam Neurovascular status intact muscle strength adequate with continued pain in the posterior heel right at the insertion of the Achilles tendon into the back of the heel bone    Assessment:     Continued inflammatory tendinitis right posterior heel with fluid buildup around the insertional point lateral side    Plan:     H&P condition reviewed and recommended complete immobilization due to pain. At this point I went ahead and placed patient into an air fracture walker with all instructions on usage and patient will be seen back for [place] to recheck again in the next 3 weeks or earlier if needed. Instructed on physical therapy and ice therapy

## 2015-07-31 ENCOUNTER — Other Ambulatory Visit: Payer: Self-pay | Admitting: Family Medicine

## 2015-08-05 ENCOUNTER — Encounter: Payer: Self-pay | Admitting: Podiatry

## 2015-08-05 ENCOUNTER — Ambulatory Visit (INDEPENDENT_AMBULATORY_CARE_PROVIDER_SITE_OTHER): Payer: PPO | Admitting: Podiatry

## 2015-08-05 VITALS — BP 145/68 | HR 77 | Resp 16

## 2015-08-05 DIAGNOSIS — M7661 Achilles tendinitis, right leg: Secondary | ICD-10-CM

## 2015-08-07 NOTE — Progress Notes (Signed)
Subjective:     Patient ID: [name], female   DOB: 10-Dec-1950, 65 y.o.   MRN: [id number]  HPI patient presents stating that the outside of my right heel is still sore and it's been going on now for at least 6 months   Review of Systems     Objective:   Physical Exam Neurovascular status intact muscle strength adequate with continued discomfort in the lateral aspect of the right heel at the insertional point of the tendon into the calcaneus    Assessment:     Continued tendinitis of the right heel posterior lateral side    Plan:     Reviewed condition and treatment options and due to long-standing nature I have recommended shockwave therapy. I reviewed shockwave pros and cons and the fact it may not cure her problem but she is willing to take this as a possibility for resolution

## 2015-08-15 ENCOUNTER — Other Ambulatory Visit: Payer: PPO

## 2015-09-12 ENCOUNTER — Other Ambulatory Visit: Payer: Self-pay

## 2015-09-12 ENCOUNTER — Other Ambulatory Visit: Payer: Self-pay | Admitting: Family Medicine

## 2015-09-12 MED ORDER — LEVOTHYROXINE SODIUM 100 MCG PO TABS: 100.0000 ug | ORAL_TABLET | Freq: Every day | ORAL | 1 refills | Status: DC

## 2015-10-09 ENCOUNTER — Other Ambulatory Visit: Payer: Self-pay | Admitting: Family Medicine

## 2015-10-14 ENCOUNTER — Other Ambulatory Visit: Payer: Self-pay | Admitting: Family Medicine

## 2015-10-14 NOTE — Telephone Encounter (Signed)
Yes thanks, may fill but needs to be seen within a month

## 2015-12-15 ENCOUNTER — Ambulatory Visit (INDEPENDENT_AMBULATORY_CARE_PROVIDER_SITE_OTHER): Payer: PPO | Admitting: Family Medicine

## 2015-12-15 ENCOUNTER — Other Ambulatory Visit: Payer: Self-pay

## 2015-12-15 ENCOUNTER — Other Ambulatory Visit: Payer: Self-pay | Admitting: Family Medicine

## 2015-12-15 ENCOUNTER — Encounter: Payer: Self-pay | Admitting: Family Medicine

## 2015-12-15 VITALS — BP 130/92 | HR 73 | Temp 98.3°F | Wt 166.0 lb

## 2015-12-15 DIAGNOSIS — Z23 Encounter for immunization: Secondary | ICD-10-CM | POA: Diagnosis not present

## 2015-12-15 DIAGNOSIS — Z7289 Other problems related to lifestyle: Secondary | ICD-10-CM | POA: Diagnosis not present

## 2015-12-15 DIAGNOSIS — E034 Atrophy of thyroid (acquired): Secondary | ICD-10-CM

## 2015-12-15 DIAGNOSIS — K219 Gastro-esophageal reflux disease without esophagitis: Secondary | ICD-10-CM

## 2015-12-15 DIAGNOSIS — M353 Polymyalgia rheumatica: Secondary | ICD-10-CM

## 2015-12-15 DIAGNOSIS — I2510 Atherosclerotic heart disease of native coronary artery without angina pectoris: Secondary | ICD-10-CM

## 2015-12-15 DIAGNOSIS — E559 Vitamin D deficiency, unspecified: Secondary | ICD-10-CM

## 2015-12-15 DIAGNOSIS — F3342 Major depressive disorder, recurrent, in full remission: Secondary | ICD-10-CM

## 2015-12-15 DIAGNOSIS — M816 Localized osteoporosis [Lequesne]: Secondary | ICD-10-CM

## 2015-12-15 LAB — COMPREHENSIVE METABOLIC PANEL
ALK PHOS: 86 U/L (ref 39–117)
ALT: 18 U/L (ref 0–35)
AST: 16 U/L (ref 0–37)
Albumin: 4 g/dL (ref 3.5–5.2)
BILIRUBIN TOTAL: 0.7 mg/dL (ref 0.2–1.2)
BUN: 15 mg/dL (ref 6–23)
CALCIUM: 9.5 mg/dL (ref 8.4–10.5)
CO2: 26 mEq/L (ref 19–32)
CREATININE: 0.76 mg/dL (ref 0.40–1.20)
Chloride: 104 mEq/L (ref 96–112)
GFR: 80.85 mL/min (ref >60.00)
GLUCOSE: 88 mg/dL (ref 70–99)
Potassium: 4.1 mEq/L (ref 3.5–5.1)
Sodium: 140 mEq/L (ref 135–145)
TOTAL PROTEIN: 6.4 g/dL (ref 6.0–8.3)

## 2015-12-15 LAB — CBC
HCT: 42.5 % (ref 36.0–46.0)
HEMOGLOBIN: 14.1 g/dL (ref 12.0–15.0)
MCHC: 33.2 g/dL (ref 30.0–36.0)
MCV: 90.1 fl (ref 78.0–100.0)
PLATELETS: 238 10*3/uL (ref 150.0–400.0)
RBC: 4.72 Mil/uL (ref 3.87–5.11)
RDW: 13.9 % (ref 11.5–15.5)
WBC: 7.5 10*3/uL (ref 4.0–10.5)

## 2015-12-15 LAB — LDL CHOLESTEROL, DIRECT: Direct LDL: 176 mg/dL

## 2015-12-15 LAB — VITAMIN D 25 HYDROXY (VIT D DEFICIENCY, FRACTURES): VITD: 17.25 ng/mL — AB (ref 30.00–100.00)

## 2015-12-15 LAB — TSH: TSH: 2.81 u[IU]/mL (ref 0.35–4.50)

## 2015-12-15 MED ORDER — PAROXETINE HCL 20 MG PO TABS: 20.0000 mg | ORAL_TABLET | Freq: Every morning | ORAL | 1 refills | Status: DC

## 2015-12-15 MED ORDER — PREDNISONE 5 MG PO TABS: 2.5000 mg | ORAL_TABLET | Freq: Every day | ORAL | 1 refills | Status: DC

## 2015-12-15 MED ORDER — ERGOCALCIFEROL 1.25 MG (50000 UT) PO CAPS: 50000.0000 [IU] | ORAL_CAPSULE | ORAL | 0 refills | Status: DC

## 2015-12-15 MED ORDER — LEVOTHYROXINE SODIUM 100 MCG PO TABS: 100.0000 ug | ORAL_TABLET | Freq: Every day | ORAL | 1 refills | Status: DC

## 2015-12-15 MED ORDER — PANTOPRAZOLE SODIUM 40 MG PO TBEC: 40.0000 mg | DELAYED_RELEASE_TABLET | Freq: Every day | ORAL | 1 refills | Status: DC

## 2015-12-15 MED ORDER — ALENDRONATE SODIUM 70 MG PO TABS: 70.0000 mg | ORAL_TABLET | ORAL | 1 refills | Status: DC

## 2015-12-15 NOTE — Progress Notes (Signed)
Pre visit review using our clinic review tool, if applicable. No additional management support is needed unless otherwise documented below in the visit note. 

## 2015-12-15 NOTE — Assessment & Plan Note (Signed)
Vitamin D deficiency S: compliant with calcium, vitamin D, fosamax. Vitamin D low in past A/P: refill fosamax. Check vitamin D level- prior had to be supplemented.

## 2015-12-15 NOTE — Assessment & Plan Note (Signed)
S: declines statin again with history of myalgias- apparently aslo had on zetia per patient. Compliant with aspirin. Dr. Percival Spanish in past. MI age 65- states no CP or SOB since that time A/P: refer back to cardiology. Consider pcsk9 inhibitor given statin intolerance

## 2015-12-15 NOTE — Assessment & Plan Note (Signed)
S:Controlled on  paxil 20mg  with phq2 of 0. NO Si/HI A/P: continue current medication.

## 2015-12-15 NOTE — Patient Instructions (Addendum)
Labs before you leave  Call Dr. Lorin Mercy and get into ortho ASAP for your shoulder  Pneumovax 23 before you leave (in other arm)  When the shoulder is doing better, I want you to go down to 2 days a week of 1/2 tablet of 5mg  prednisone (sautrday and Sunday). Sign up for mychart and update me in 2 months. Then see me in 4 months- let's work to get you off the prednisone if possible but need to go slowly  Blood pressure up today but you are in pain- advise 4 month follow up to reassess. Dash eating plan can help control pressure without medication   DASH Eating Plan DASH stands for "Dietary Approaches to Stop Hypertension." The DASH eating plan is a healthy eating plan that has been shown to reduce high blood pressure (hypertension). Additional health benefits may include reducing the risk of type 2 diabetes mellitus, heart disease, and stroke. The DASH eating plan may also help with weight loss. WHAT DO I NEED TO KNOW ABOUT THE DASH EATING PLAN? For the DASH eating plan, you will follow these general guidelines:  Choose foods with a percent daily value for sodium of less than 5% (as listed on the food label).  Use salt-free seasonings or herbs instead of table salt or sea salt.  Check with your health care provider or pharmacist before using salt substitutes.  Eat lower-sodium products, often labeled as "lower sodium" or "no salt added."  Eat fresh foods.  Eat more vegetables, fruits, and low-fat dairy products.  Choose whole grains. Look for the word "whole" as the first word in the ingredient list.  Choose fish and skinless chicken or [place] more often than red meat. Limit fish, poultry, and meat to 6 oz (170 g) each day.  Limit sweets, desserts, sugars, and sugary drinks.  Choose heart-healthy fats.  Limit cheese to 1 oz (28 g) per day.  Eat more home-cooked food and less restaurant, buffet, and fast food.  Limit fried foods.  Cook foods using methods other than  frying.  Limit canned vegetables. If you do use them, rinse them well to decrease the sodium.  When eating at a restaurant, ask that your food be prepared with less salt, or no salt if possible. WHAT FOODS CAN I EAT? Seek help from a dietitian for individual calorie needs. Grains Whole grain or whole wheat bread. Brown rice. Whole grain or whole wheat pasta. Quinoa, bulgur, and whole grain cereals. Low-sodium cereals. Corn or whole wheat flour tortillas. Whole grain cornbread. Whole grain crackers. Low-sodium crackers. Vegetables Fresh or frozen vegetables (raw, steamed, roasted, or grilled). Low-sodium or reduced-sodium tomato and vegetable juices. Low-sodium or reduced-sodium tomato sauce and paste. Low-sodium or reduced-sodium canned vegetables.  Fruits All fresh, canned (in natural juice), or frozen fruits. Meat and Other Protein Products Ground beef (85% or leaner), grass-fed beef, or beef trimmed of fat. Skinless chicken or [place]. Ground chicken or [place]. Pork trimmed of fat. All fish and seafood. Eggs. Dried beans, peas, or lentils. Unsalted nuts and seeds. Unsalted canned beans. Dairy Low-fat dairy products, such as skim or 1% milk, 2% or reduced-fat cheeses, low-fat ricotta or cottage cheese, or plain low-fat yogurt. Low-sodium or reduced-sodium cheeses. Fats and Oils Tub margarines without trans fats. Light or reduced-fat mayonnaise and salad dressings (reduced sodium). Avocado. Safflower, olive, or canola oils. Natural peanut or almond butter. Other Unsalted popcorn and pretzels. The items listed above may not be a complete list of recommended foods or beverages. Contact  your dietitian for more options. WHAT FOODS ARE NOT RECOMMENDED? Grains White bread. White pasta. White rice. Refined cornbread. Bagels and croissants. Crackers that contain trans fat. Vegetables Creamed or fried vegetables. Vegetables in a cheese sauce. Regular canned vegetables. Regular canned tomato sauce  and paste. Regular tomato and vegetable juices. Fruits Dried fruits. Canned fruit in light or heavy syrup. Fruit juice. Meat and Other Protein Products Fatty cuts of meat. Ribs, chicken wings, bacon, sausage, bologna, salami, chitterlings, fatback, hot dogs, bratwurst, and packaged luncheon meats. Salted nuts and seeds. Canned beans with salt. Dairy Whole or 2% milk, cream, half-and-half, and cream cheese. Whole-fat or sweetened yogurt. Full-fat cheeses or blue cheese. Nondairy creamers and whipped toppings. Processed cheese, cheese spreads, or cheese curds. Condiments Onion and garlic salt, seasoned salt, table salt, and sea salt. Canned and packaged gravies. Worcestershire sauce. Tartar sauce. Barbecue sauce. Teriyaki sauce. Soy sauce, including reduced sodium. Steak sauce. Fish sauce. Oyster sauce. Cocktail sauce. Horseradish. Ketchup and mustard. Meat flavorings and tenderizers. Bouillon cubes. Hot sauce. Tabasco sauce. Marinades. Taco seasonings. Relishes. Fats and Oils Butter, stick margarine, lard, shortening, ghee, and bacon fat. Coconut, palm kernel, or palm oils. Regular salad dressings. Other Pickles and olives. Salted popcorn and pretzels. The items listed above may not be a complete list of foods and beverages to avoid. Contact your dietitian for more information. WHERE CAN I FIND MORE INFORMATION? National Heart, Lung, and Blood Institute: travelstabloid.com   This information is not intended to replace advice given to you by your health care provider. Make sure you discuss any questions you have with your health care provider.   Document Released: 01/18/2011 Document Revised: 02/19/2014 Document Reviewed: 12/03/2012 Elsevier Interactive Patient Education [employer].

## 2015-12-15 NOTE — Assessment & Plan Note (Addendum)
S: Taking 5mg  prednisone daily but half tablet one day a week. Broke right shoulder lateral left shoulder years ago. 10 days started hurting suddenly. Trouble moving arm throughout day. 5/10 but up to 10/10.  A/P: Discussed left shoulder pain likely bursitis and not PMR. Offered injection today- declines as wants to see her orthopedist and get films before injection. We discussed when doing better from the shoulder- step prednisone down to full tablet 5 mg on weedays and hafl tablet of 2.5mg  on weekends. Encouraged her to message me through mychart after signing up in 2 months with status update then see me in 3 months.

## 2015-12-15 NOTE — Progress Notes (Signed)
Subjective:  [name] is a 65 y.o. year old very pleasant female patient who presents for/with See problem oriented charting ROS- No chest pain or shortness of breath. No headache or blurry vision. Does have left shoulder pain.see any ROS included in HPI as well.   Past Medical History-  Patient Active Problem List   Diagnosis Date Noted  . Polymyalgia rheumatica ([place]) 12/01/2010    Priority: High  . CAD (coronary artery disease) 09/09/2007    Priority: High  . Osteoporosis 05/26/2014    Priority: Medium  . Depression 03/20/2007    Priority: Medium  . Hypothyroid 02/13/1999    Priority: Medium  . Polyp of colon, adenomatous 10/11/2014    Priority: Low  . Vitamin D deficiency 05/26/2014    Priority: Low  . Hematuria 09/09/2007    Priority: Low  . GERD (gastroesophageal reflux disease) 12/15/2015    Medications- reviewed and updated Current Outpatient Prescriptions  Medication Sig Dispense Refill  . aspirin 81 MG EC tablet Take 81 mg by mouth daily.     . diclofenac (VOLTAREN) 75 MG EC tablet Take 1 tablet (75 mg total) by mouth 2 (two) times daily. 50 tablet 2  . levothyroxine (SYNTHROID, LEVOTHROID) 100 MCG tablet Take 1 tablet (100 mcg total) by mouth daily. 90 tablet 1  . pantoprazole (PROTONIX) 40 MG tablet Take 1 tablet (40 mg total) by mouth daily. 90 tablet 1  . PARoxetine (PAXIL) 20 MG tablet Take 1 tablet (20 mg total) by mouth every morning. 90 tablet 1  . predniSONE (DELTASONE) 5 MG tablet Take 0.5-1 tablets (2.5-5 mg total) by mouth daily. Full tablet Monday-Friday. Half tablet on Saturday and Sunday. 90 tablet 1  . alendronate (FOSAMAX) 70 MG tablet Take 1 tablet (70 mg total) by mouth every 7 (seven) days. Take in morning on empty stomach with full glass of water. Stay upright. May eat one hour after taking 13 tablet 1   No current facility-administered medications for this visit.     Objective: BP (!) 130/92 (BP Location: Left Arm, Patient Position:  Sitting, Cuff Size: Normal)   Pulse 73   Temp 98.3 F (36.8 C) (Oral)   Wt 166 lb (75.3 kg)   SpO2 93%   BMI 31.37 kg/m  Gen: NAD, resting comfortably CV: RRR no murmurs rubs or gallops Lungs: CTAB no crackles, wheeze, rhonchi Ext: no edema  MSK Left Shoulder: Inspection reveals no abnormalities, atrophy or asymmetry. Palpation is normal with no tenderness over AC joint or bicipital groove. ROM is limited in abduction to 130 degrees, able to extend past this point but has pain Signs of impingement with positive Neer and Hawkin's tests, empty can. Suspect bursitis.  No painful arc and no drop arm sign.   Assessment/Plan:  Polymyalgia rheumatica S: Taking 5mg  prednisone daily but half tablet one day a week. Broke right shoulder lateral left shoulder years ago. 10 days started hurting suddenly. Trouble moving arm throughout day. 5/10 but up to 10/10.  A/P: Discussed left shoulder pain likely bursitis and not PMR. Offered injection today- declines as wants to see her orthopedist and get films before injection. We discussed when doing better from the shoulder- step prednisone down to full tablet 5 mg on weedays and hafl tablet of 2.5mg  on weekends. Encouraged her to message me through mychart after signing up in 2 months with status update then see me in 3 months.   CAD (coronary artery disease) S: declines statin again with history of myalgias-  apparently aslo had on zetia per patient. Compliant with aspirin. Dr. Percival Spanish in past. MI age 19- states no CP or SOB since that time A/P: refer back to cardiology. Consider pcsk9 inhibitor given statin intolerance   Osteoporosis Vitamin D deficiency S: compliant with calcium, vitamin D, fosamax. Vitamin D low in past A/P: refill fosamax. Check vitamin D level- prior had to be supplemented.    GERD (gastroesophageal reflux disease) Failed zantac. Protonix. Discussed osteoporosis risks  Depression S:Controlled on  paxil 20mg  with phq2 of  0. NO Si/HI A/P: continue current medication.    Elevated BP S: controlled poorly but in left shoulder pain BP Readings from Last 3 Encounters:  12/15/15 (!) 130/92  08/05/15 (!) 145/68  07/07/15 117/76  A/P:Continue without meds - if elevated at follow up without shoulder pain then would need to consider medication. Gave handout on dash eating plan  4 months.  Orders Placed This Encounter  Procedures  . Pneumococcal polysaccharide vaccine 23-valent greater than or equal to 2yo subcutaneous/IM  . CBC    [place]  . Comprehensive metabolic panel    [place]  . LDL cholesterol, direct    [place]  . TSH    [place]  . Hepatitis C antibody, reflex    [facility]  . VITAMIN D 25 Hydroxy (Vit-D Deficiency, Fractures)    [place]  . Ambulatory referral to Cardiology    Referral Priority:   Routine    Referral Type:   Consultation    Referral Reason:   Specialty Services Required    Requested Specialty:   Cardiology    Number of Visits Requested:   1    Meds ordered this encounter  Medications  . DISCONTD: alendronate (FOSAMAX) 70 MG tablet    Sig: Take 1 tablet (70 mg total) by mouth every 7 (seven) days. Take in morning on empty stomach with full glass of water. Stay upright. May eat one hour after taking    Dispense:  13 tablet    Refill:  1  . levothyroxine (SYNTHROID, LEVOTHROID) 100 MCG tablet    Sig: Take 1 tablet (100 mcg total) by mouth daily.    Dispense:  90 tablet    Refill:  1  . PARoxetine (PAXIL) 20 MG tablet    Sig: Take 1 tablet (20 mg total) by mouth every morning.    Dispense:  90 tablet    Refill:  1  . predniSONE (DELTASONE) 5 MG tablet    Sig: Take 0.5-1 tablets (2.5-5 mg total) by mouth daily. Full tablet Monday-Friday. Half tablet on Saturday and Sunday.    Dispense:  90 tablet    Refill:  1  . pantoprazole (PROTONIX) 40 MG tablet    Sig: Take 1 tablet (40 mg total) by mouth daily.    Dispense:  90 tablet    Refill:  1    Return precautions  advised.  Garret Reddish, MD

## 2015-12-15 NOTE — Assessment & Plan Note (Signed)
Failed zantac. Protonix. Discussed osteoporosis risks

## 2015-12-16 LAB — HEPATITIS C ANTIBODY: HCV AB: NEGATIVE

## 2015-12-19 ENCOUNTER — Other Ambulatory Visit: Payer: Self-pay

## 2015-12-21 ENCOUNTER — Encounter (INDEPENDENT_AMBULATORY_CARE_PROVIDER_SITE_OTHER): Payer: Self-pay | Admitting: Sports Medicine

## 2015-12-21 ENCOUNTER — Ambulatory Visit (INDEPENDENT_AMBULATORY_CARE_PROVIDER_SITE_OTHER): Payer: PPO | Admitting: Sports Medicine

## 2015-12-21 ENCOUNTER — Other Ambulatory Visit: Payer: Self-pay | Admitting: Family Medicine

## 2015-12-21 ENCOUNTER — Ambulatory Visit (INDEPENDENT_AMBULATORY_CARE_PROVIDER_SITE_OTHER): Payer: PPO

## 2015-12-21 VITALS — BP 141/89 | HR 60 | Ht 61.0 in | Wt 166.0 lb

## 2015-12-21 DIAGNOSIS — M25512 Pain in left shoulder: Secondary | ICD-10-CM | POA: Diagnosis not present

## 2015-12-21 MED ORDER — LIDOCAINE HCL 1 % IJ SOLN
3.0000 mL | INTRAMUSCULAR | Status: AC | PRN
  Administered 2015-12-21: 3 mL

## 2015-12-21 MED ORDER — METHYLPREDNISOLONE ACETATE 40 MG/ML IJ SUSP
40.0000 mg | INTRAMUSCULAR | Status: AC | PRN
  Administered 2015-12-21: 40 mg via INTRA_ARTICULAR

## 2015-12-21 NOTE — Progress Notes (Signed)
[name] - 65 y.o. female MRN [id number]  Date of birth: [birth year]  Office Visit Note: Visit Date: 12/21/2015 PCP: Garret Reddish, MD Referred by: [name], MD  Subjective: Chief Complaint  Patient presents with  . Left Shoulder - Pain   HPI: Patient states left shoulder for about 3 weeks.  States she had a fall and fell on the left shoulder.  Hurts to raise left arm above her head.    Prior left glenoid fracture but had been doing well until several weeks ago. She did have a fall however & no pain for approximately one week until after the fall. Now she is having pain with exertion & overhead lifting. Was previously unable to lift her arm above the level of her shoulder but this has improved. She is taking ibuprofen with moderate improvement.    ROS Otherwise per HPI.  Assessment & Plan: Visit Diagnoses:  1. Acute pain of left shoulder     Plan: Findings:  Left shoulder subacromial injection performed today. Like for her begin on gentle range of motion exercises & in if any lack of improvement she will call for referral to physical therapy. We can consider intra-articular injection as well as any evidence of worsening range of motion but today for clinical exam she does not seem to be limited from a frozen shoulder standpoint. We discussed that she will likely continue to have intermittent flareups of her left shoulder given the prior glenoid fracture but at this time no further intervention or workup is indicated.    Meds & Orders: No orders of the defined types were placed in this encounter.   Orders Placed This Encounter  Procedures  . Large Joint Injection/Arthrocentesis  . XR Shoulder Left    Follow-up: Return if symptoms worsen or fail to improve.   Procedures: Large Joint Inj Date/Time: 12/21/2015 9:38 AM Performed by: Teresa Coombs D Authorized by: Teresa Coombs D   Location:  Shoulder Site:  L subacromial bursa Needle Size:  22 G Approach:   Posterior Ultrasound Guidance: No   Fluoroscopic Guidance: No   Arthrogram: No Medications:  3 mL lidocaine 1 %; 40 mg methylPREDNISolone acetate 40 MG/ML    No notes on file   Clinical History: No specialty comments available.  She reports that she has never smoked. She has never used smokeless tobacco. No results for input(s): HGBA1C, LABURIC in the last 8760 hours.  Objective:  VS:  HT:5\' 1"  (154.9 cm)   WT:166 lb (75.3 kg)  BMI:31.4    BP:(!) 141/89  HR:60bpm  TEMP: ( )  RESP:  Physical Exam  Constitutional: She appears well-developed and well-nourished. No distress.  HENT:  Head: Normocephalic and atraumatic.  Pulmonary/Chest: Effort normal. No respiratory distress.  Neurological: She is alert.  Appropriately interactive.  Skin: Skin is warm and dry. No rash noted. She is not diaphoretic. No erythema. No pallor.  Psychiatric: She has a normal mood and affect. Her behavior is normal. Judgment and thought content normal.    Left Shoulder Exam   Comments:  Overall well aligned shoulder with no focal tenderness to palpation. She has pain with raising her arm above her head but & he can test strength is intact. Pain is worse with Yergason's testing. Speeds test is normal. Strength is good with internal rotation & external rotation. No pain with axial loading & circumduction. Some pain with Hawkins & minimal pain with Neers.  Upper cherry strength is otherwise intact in all myotomes.  Upper extremity sensation is intact to light touch. No pain with farm squeeze tests or brachial plexus squeeze.     Imaging: Xr Shoulder Left  Result Date: 12/21/2015 3V x-ray left shoulder overall good alignment prior glenoid fracture well-healed with mild osteoarthritis. Humeral head is normal alignment with normal subacromial space.  Slightly enlargement of cardiac silhouette however this is not a dedicated film and is slightly oblique. Impression mild osteoarthritis   Past  Medical/Family/Surgical/Social History: Medications & Allergies reviewed per EMR Patient Active Problem List   Diagnosis Date Noted  . GERD (gastroesophageal reflux disease) 12/15/2015  . Polyp of colon, adenomatous 10/11/2014  . Vitamin D deficiency 05/26/2014  . Osteoporosis 05/26/2014  . Polymyalgia rheumatica ([place]) 12/01/2010  . CAD (coronary artery disease) 09/09/2007  . Hematuria 09/09/2007  . Depression 03/20/2007  . Hypothyroid 02/13/1999   Past Medical History:  Diagnosis Date  . CAD (coronary artery disease)   . Depression   . DISORDER, MENOPAUSAL NOS 03/20/2007   Qualifier: Diagnosis of  By: Arnoldo Morale MD, Balinda Quails   . Hypothyroidism   . Myocardial infarction   . MYOCARDIAL INFARCTION, HX OF 09/09/2007   Qualifier: Diagnosis of  By: Leanne Chang MD, Bruce    . Polymyalgia rheumatica (HCC)    Family History  Problem Relation Age of Onset  . Hypertension Father   . Heart disease Father   . Colon cancer Maternal Uncle   . Hypertension Mother    Past Surgical History:  Procedure Laterality Date  . CHOLECYSTECTOMY N/A 11/24/2012   Procedure: LAPAROSCOPIC CHOLECYSTECTOMY;  Surgeon: Ralene Ok, MD;  Location: [place];  Service: General;  Laterality: N/A;  . EYE SURGERY    . PTCA     Social History   Occupational History  . Not on file.   Social History Main Topics  . Smoking status: Never Smoker  . Smokeless tobacco: Never Used  . Alcohol use No  . Drug use: No  . Sexual activity: Not on file

## 2016-01-06 ENCOUNTER — Ambulatory Visit (INDEPENDENT_AMBULATORY_CARE_PROVIDER_SITE_OTHER): Payer: PPO

## 2016-01-06 ENCOUNTER — Encounter (HOSPITAL_COMMUNITY): Payer: Self-pay

## 2016-01-06 ENCOUNTER — Emergency Department (HOSPITAL_COMMUNITY): Payer: PPO

## 2016-01-06 ENCOUNTER — Emergency Department (HOSPITAL_COMMUNITY)
Admission: EM | Admit: 2016-01-06 | Discharge: 2016-01-06 | Disposition: A | Discharge status: Home / Self Care | Payer: PPO | Attending: Emergency Medicine | Admitting: Emergency Medicine

## 2016-01-06 ENCOUNTER — Ambulatory Visit (INDEPENDENT_AMBULATORY_CARE_PROVIDER_SITE_OTHER): Payer: PPO | Admitting: Family Medicine

## 2016-01-06 VITALS — BP 124/90 | HR 92 | Temp 97.5°F | Resp 18 | Ht 61.0 in | Wt 165.0 lb

## 2016-01-06 DIAGNOSIS — Z7982 Long term (current) use of aspirin: Secondary | ICD-10-CM | POA: Diagnosis not present

## 2016-01-06 DIAGNOSIS — R1031 Right lower quadrant pain: Secondary | ICD-10-CM | POA: Insufficient documentation

## 2016-01-06 DIAGNOSIS — R1030 Lower abdominal pain, unspecified: Secondary | ICD-10-CM | POA: Diagnosis not present

## 2016-01-06 DIAGNOSIS — E039 Hypothyroidism, unspecified: Secondary | ICD-10-CM | POA: Insufficient documentation

## 2016-01-06 DIAGNOSIS — R1032 Left lower quadrant pain: Secondary | ICD-10-CM | POA: Insufficient documentation

## 2016-01-06 DIAGNOSIS — I252 Old myocardial infarction: Secondary | ICD-10-CM | POA: Insufficient documentation

## 2016-01-06 DIAGNOSIS — R102 Pelvic and perineal pain: Secondary | ICD-10-CM

## 2016-01-06 DIAGNOSIS — I2510 Atherosclerotic heart disease of native coronary artery without angina pectoris: Secondary | ICD-10-CM | POA: Insufficient documentation

## 2016-01-06 LAB — COMPREHENSIVE METABOLIC PANEL
ALBUMIN: 3.9 g/dL (ref 3.5–5.0)
ALT: 23 U/L (ref 14–54)
ANION GAP: 11 (ref 5–15)
AST: 25 U/L (ref 15–41)
Alkaline Phosphatase: 85 U/L (ref 38–126)
BUN: 16 mg/dL (ref 6–20)
CHLORIDE: 103 mmol/L (ref 101–111)
CO2: 25 mmol/L (ref 22–32)
Calcium: 9.7 mg/dL (ref 8.9–10.3)
Creatinine, Ser: 0.98 mg/dL (ref 0.44–1.00)
GFR calc Af Amer: >60 mL/min (ref >60)
GFR calc non Af Amer: 59 mL/min — ABNORMAL LOW (ref >60)
GLUCOSE: 90 mg/dL (ref 65–99)
POTASSIUM: 3.8 mmol/L (ref 3.5–5.1)
SODIUM: 139 mmol/L (ref 135–145)
Total Bilirubin: 1.1 mg/dL (ref 0.3–1.2)
Total Protein: 6.8 g/dL (ref 6.5–8.1)

## 2016-01-06 LAB — POCT CBC
GRANULOCYTE PERCENT: 78.3 % (ref 37–80)
HEMATOCRIT: 39.5 % (ref 37.7–47.9)
Hemoglobin: 13.8 g/dL (ref 12.2–16.2)
Lymph, poc: 1.7 (ref 0.6–3.4)
MCH: 30.8 pg (ref 27–31.2)
MCHC: 34.9 g/dL (ref 31.8–35.4)
MCV: 88.3 fL (ref 80–97)
MID (CBC): 0.7 (ref 0–0.9)
MPV: 7.4 fL (ref 0–99.8)
POC GRANULOCYTE: 8.8 — AB (ref 2–6.9)
POC LYMPH %: 15.2 % (ref 10–50)
POC MID %: 6.5 %M (ref 0–12)
Platelet Count, POC: 240 10*3/uL (ref 142–424)
RBC: 4.47 M/uL (ref 4.04–5.48)
RDW, POC: 13.8 %
WBC: 11.2 10*3/uL — AB (ref 4.6–10.2)

## 2016-01-06 LAB — POC MICROSCOPIC URINALYSIS (UMFC): MUCUS RE: ABSENT

## 2016-01-06 LAB — POCT URINALYSIS DIP (MANUAL ENTRY)
BILIRUBIN UA: NEGATIVE
Bilirubin, UA: NEGATIVE
GLUCOSE UA: NEGATIVE
Leukocytes, UA: NEGATIVE
Nitrite, UA: NEGATIVE
Protein Ur, POC: NEGATIVE
SPEC GRAV UA: 1.02
Urobilinogen, UA: 0.2
pH, UA: 5.5

## 2016-01-06 LAB — LIPASE, BLOOD: Lipase: 37 U/L (ref 11–51)

## 2016-01-06 MED ORDER — IOPAMIDOL (ISOVUE-300) INJECTION 61%
INTRAVENOUS | Status: AC
  Administered 2016-01-06: 100 mL
  Filled 2016-01-06: qty 100

## 2016-01-06 MED ORDER — OXYCODONE-ACETAMINOPHEN 5-325 MG PO TABS
1.0000 | ORAL_TABLET | Freq: Once | ORAL | Status: AC
  Administered 2016-01-06: 1 via ORAL

## 2016-01-06 MED ORDER — TRAMADOL HCL 50 MG PO TABS: 50.0000 mg | ORAL_TABLET | Freq: Three times a day (TID) | ORAL | 0 refills | Status: DC | PRN

## 2016-01-06 MED ORDER — CEPHALEXIN 250 MG PO CAPS
500.0000 mg | ORAL_CAPSULE | Freq: Once | ORAL | Status: AC
Start: 2016-01-06 — End: 2016-01-06
  Administered 2016-01-06: 500 mg via ORAL
  Filled 2016-01-06: qty 2

## 2016-01-06 MED ORDER — OXYCODONE-ACETAMINOPHEN 5-325 MG PO TABS
1.0000 | ORAL_TABLET | ORAL | Status: DC | PRN
  Administered 2016-01-06: 1 via ORAL
  Filled 2016-01-06: qty 1

## 2016-01-06 MED ORDER — CEPHALEXIN 500 MG PO CAPS: 500.0000 mg | ORAL_CAPSULE | Freq: Two times a day (BID) | ORAL | 0 refills | Status: AC

## 2016-01-06 MED ORDER — KETOROLAC TROMETHAMINE 15 MG/ML IJ SOLN
15.0000 mg | Freq: Once | INTRAMUSCULAR | Status: AC
  Administered 2016-01-06: 15 mg via INTRAVENOUS
  Filled 2016-01-06: qty 1

## 2016-01-06 MED ORDER — SODIUM CHLORIDE 0.9 % IV BOLUS (SEPSIS)
1000.0000 mL | Freq: Once | INTRAVENOUS | Status: AC
  Administered 2016-01-06: 1000 mL via INTRAVENOUS

## 2016-01-06 MED ORDER — OXYCODONE-ACETAMINOPHEN 5-325 MG PO TABS
ORAL_TABLET | ORAL | Status: AC
  Filled 2016-01-06: qty 1

## 2016-01-06 NOTE — ED Triage Notes (Signed)
Pt reports suprapubic abd pain that started last night. Pt denies n/v/d. She has had her gall bladder removed. Pt seen at [facility] and sent here to have CT done.

## 2016-01-06 NOTE — ED Notes (Signed)
Pt states she is able to take percocet.

## 2016-01-06 NOTE — Discharge Instructions (Signed)
Your work up today shows a possible urinary tract infection. Based take her antibiotics as prescribed. A urine culture is pending. If it becomes positive you will be contacted with further instructions. Your abdomen/pelvis CT scan did not show any acute pathology. Your appendix was normal. You did not have signs of kidney stone or kidney infection.  See your doctor early next week for re-evaluation if you continue to have pain.  If you develop fever, intractable vomiting, severely worsening pain or other worsening symptoms return to the emergency department for evaluation.

## 2016-01-06 NOTE — Patient Instructions (Addendum)
Please go to the ED for further evaluation prior to taking any medication for abdominal pain.  Tramadol 50 mg every 8 hours for pain.   IF you received an x-ray today, you will receive an invoice from [facility]. Please contact [facility] at [phone] with questions or concerns regarding your invoice.   IF you received labwork today, you will receive an invoice from [employer]. Please contact [facility] at [phone] with questions or concerns regarding your invoice.   Our billing staff will not be able to assist you with questions regarding bills from these companies.  You will be contacted with the lab results as soon as they are available. The fastest way to get your results is to activate your My Chart account. Instructions are located on the last page of this paperwork. If you have not heard from [place] regarding the results in 2 weeks, please contact this office.     Abdominal Pain, Adult Abdominal pain can be caused by many things. Often, abdominal pain is not serious and it gets better with no treatment or by being treated at home. However, sometimes abdominal pain is serious. Your health care provider will do a medical history and a physical exam to try to determine the cause of your abdominal pain. Follow these instructions at home:  Take over-the-counter and prescription medicines only as told by your health care provider. Do not take a laxative unless told by your health care provider.  Drink enough fluid to keep your urine clear or pale yellow.  Watch your condition for any changes.  Keep all follow-up visits as told by your health care provider. This is important. Contact a health care provider if:  Your abdominal pain changes or gets worse.  You are not hungry or you lose weight without trying.  You are constipated or have diarrhea for more than 2-3 days.  You have pain when you urinate or have a bowel movement.  Your  abdominal pain wakes you up at night.  Your pain gets worse with meals, after eating, or with certain foods.  You are throwing up and cannot keep anything down.  You have a fever. Get help right away if:  Your pain does not go away as soon as your health care provider told you to expect.  You cannot stop throwing up.  Your pain is only in areas of the abdomen, such as the right side or the left lower portion of the abdomen.  You have bloody or black stools, or stools that look like tar.  You have severe pain, cramping, or bloating in your abdomen.  You have signs of dehydration, such as:  Dark urine, very little urine, or no urine.  Cracked lips.  Dry mouth.  Sunken eyes.  Sleepiness.  Weakness. This information is not intended to replace advice given to you by your health care provider. Make sure you discuss any questions you have with your health care provider. Document Released: 11/08/2004 Document Revised: 08/19/2015 Document Reviewed: 07/13/2015 Elsevier Interactive Patient Education  2017 [employer].

## 2016-01-06 NOTE — Progress Notes (Signed)
Patient ID: [name], female    DOB: 06/13/1950, 65 y.o.   MRN: [id number]  PCP: Garret Reddish, MD  Chief Complaint  Patient presents with  . Abdominal Pain    Started last night describes as labor pain    Subjective:   HPI 65 year old presents for evaluation of abdominal pain. Patient normally receives care at [facility]. She reports last night around 8:00 pm, she began to experience pain bilaterally below her umbilicus. Reports pain as sharp and gradually increasing intensity.  She still had abdominal pain today and attempted to go to work, pain worsened with standing and she left work and came directly to this clinic.  Progressively worst abdominal pain. Denies nausea or any bowel movement or urge to defecate since abdominal pain started. No urge to have a bowel movement. Reports that she still has both ovaries and her appendix. She doesn't have her gallbladder.   Social History   Social History  . Marital status: Married    Spouse name: N/A  . Number of children: N/A  . Years of education: N/A   Occupational History  . Not on file.   Social History Main Topics  . Smoking status: Never Smoker  . Smokeless tobacco: Never Used  . Alcohol use No  . Drug use: No  . Sexual activity: Not on file   Other Topics Concern  . Not on file   Social History Narrative   Husband and 2 adult children live at home. No grandkids. Son with epilepsy.       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 No part time.     Family History  Problem Relation Age of Onset  . Hypertension Father   . Heart disease Father   . Cancer Father   . Hyperlipidemia Father   . Colon cancer Maternal Uncle   . Hypertension Mother   . Cancer Mother   . Hyperlipidemia Mother     Review of Systems See HPI    Patient Active Problem List   Diagnosis Date Noted  . GERD (gastroesophageal reflux disease) 12/15/2015  . Polyp of colon, adenomatous 10/11/2014  . Vitamin D deficiency  05/26/2014  . Osteoporosis 05/26/2014  . Polymyalgia rheumatica ([place]) 12/01/2010  . CAD (coronary artery disease) 09/09/2007  . Hematuria 09/09/2007  . Depression 03/20/2007  . Hypothyroid 02/13/1999     Prior to Admission medications   Medication Sig Start Date End Date Taking? Authorizing Provider  alendronate (FOSAMAX) 70 MG tablet Take 1 tablet (70 mg total) by mouth every 7 (seven) days. Take in morning on empty stomach with full glass of water. Stay upright. May eat one hour after taking 12/15/15  Yes [name], MD  aspirin 81 MG EC tablet Take 81 mg by mouth daily.    Yes Historical Provider, MD  ergocalciferol (VITAMIN D2) 50000 units capsule Take 1 capsule (50,000 Units total) by mouth once a week. 12/15/15 12/14/16 Yes [name], MD  levothyroxine (SYNTHROID, LEVOTHROID) 100 MCG tablet Take 1 tablet (100 mcg total) by mouth daily. 12/15/15  Yes [name], MD  pantoprazole (PROTONIX) 40 MG tablet Take 1 tablet (40 mg total) by mouth daily. 12/15/15  Yes [name], MD  PARoxetine (PAXIL) 20 MG tablet TAKE 1 TABLET BY MOUTH EVERY MORNING 12/21/15  Yes [name], MD  predniSONE (DELTASONE) 5 MG tablet Take 0.5-1 tablets (2.5-5 mg total) by mouth daily. Full tablet Monday-Friday. Half tablet on Saturday  and Sunday. 12/15/15  Yes [name], MD     Allergies  Allergen Reactions  . Morphine And Related Nausea And Vomiting  . Prochlorperazine     REACTION: nerve reaction  . Simvastatin     REACTION: leg cramps  . Sulfa Antibiotics        Objective:  Physical Exam  Constitutional: She is oriented to person, place, and time. She appears well-developed and well-nourished.  HENT:  Head: Normocephalic and atraumatic.  Right Ear: External ear normal.  Left Ear: External ear normal.  Abdominal: She exhibits no pulsatile liver and no abdominal bruit. Bowel sounds are decreased. There is no splenomegaly or hepatomegaly. There is tenderness in the  right lower quadrant, suprapubic area and left lower quadrant. There is rebound and guarding. There is no rigidity, no CVA tenderness and negative Murphy's sign.  Musculoskeletal: Normal range of motion.  Neurological: She is alert and oriented to person, place, and time.  Skin: Skin is warm and dry.  Psychiatric: She has a normal mood and affect. Her behavior is normal. Judgment and thought content normal.     Vitals:   01/06/16 1237  BP: 124/90  Pulse: 92  Resp: 18  Temp: 97.5 F (36.4 C)   Assessment & Plan:  1. Abdominal pain, bilateral lower quadrant - POCT CBC - POCT urinalysis dipstick - POCT Microscopic Urinalysis ([facility]) - DG Abd 2 Views  Patient presents today with acute abdominal pain bilateral lower quadrant, sharp, increasing in intensity since last evening. WBC are elevated. Exam positive for bilateral abdominal tenderness with light and deep palpation.  Abdominal xray was unremarkable.  Sending to ED for stat CT of abdomin and pelvis to rule out, appendicitis, abdominal mass, and or bowel blockage causing pain.   Plan: Advised to go to [place] ED for CT scan of abdomen/pelvis. Do not eat or drink anything prior to being seen at the ED. Tramadol 50 mg every 8 hours as needed for pain. Follow-up as needed.  Carroll Sage. Kenton Kingfisher, MSN, FNP-C [facility]

## 2016-01-06 NOTE — ED Provider Notes (Signed)
I saw and evaluated the patient, reviewed the resident's note and I agree with the findings and plan.  Pertinent History: the pt has a history of abdominal pain which started last night, woke her up several times throughout the evening and has been persistent throughout the day and worse when she stands up and walks, better when she lays and relaxes. No vomiting, no fevers, prior history of cholecystectomy and cesarean section  Pertinent Exam findings: On exam the patient is overweight but has an overall very soft abdomen, in the suprapubic area and the just off the midline bilateral lower abdomen there is tenderness but no peritoneal signs. No fullness, no masses, normal heart and lung exam, no edema. Her mental status is normal and she did get some relief with the Percocet given while she was waiting to come back.edures:  CT scan of the abdomen and pelvis will be ordered to further evaluate the source of the pain as this could be appendicitis, diverticulitis or some other intra-abdominal process that is causing pain that would need further evaluation or potential surgery. IV will be placed, medications for pain, CT scan.  CT scan unremarkable,  No other obvious source of pain  I personally interpreted the EKG as well as the resident and agree with the interpretation on the resident's chart.  Final diagnoses:  Abdominal pain, suprapubic      Noemi Chapel, MD 01/07/16 1441

## 2016-01-06 NOTE — ED Notes (Signed)
pts husband has come to triage asking for wait time  20 patients in front of his wife  Wait time given

## 2016-01-06 NOTE — ED Provider Notes (Signed)
[facility] DEPT Provider Note   CSN: [id number] Arrival date & time: 01/06/16  1426     History   Chief Complaint Chief Complaint  Patient presents with  . Abdominal Pain    HPI [name] is a 65 y.o. female.  HPI Patient is a 89 rolled female with past history of CAD, renal stones, anxiety presents with lower abdominal discomfort. Patient reports onset was last night around 9 PM while at rest. Pain is in her suprapubic right lower quadrant and lower quadrant. She describes the pain as feeling similar to labor. Pain is worse with walking and with movement. She received slight relief after percocet given in the waiting room. Denies associated fevers, chills,  nausea, vomiting, changes in bowel movements, dysuria, hematuria. Patient first went to work today but pain became worse and she presented to urgent care for evaluation. There she had a CBC showing mild leukocytosis and UA showing white blood cells and bacteria. She had a 2 view abdominal x-ray that was unremarkable. Due to significant discomfort, she was advised to present to the emergency department for CT scan to rule out appendicitis or other acute abdominal pathology.  Past Medical History:  Diagnosis Date  . Anxiety   . CAD (coronary artery disease)   . Depression   . DISORDER, MENOPAUSAL NOS 03/20/2007   Qualifier: Diagnosis of  By: Arnoldo Morale MD, Balinda Quails   . Hypothyroidism   . Myocardial infarction   . MYOCARDIAL INFARCTION, HX OF 09/09/2007   Qualifier: Diagnosis of  By: Leanne Chang MD, Bruce    . Osteoporosis   . Polymyalgia rheumatica [facility])     Patient Active Problem List   Diagnosis Date Noted  . GERD (gastroesophageal reflux disease) 12/15/2015  . Polyp of colon, adenomatous 10/11/2014  . Vitamin D deficiency 05/26/2014  . Osteoporosis 05/26/2014  . Polymyalgia rheumatica ([place]) 12/01/2010  . CAD (coronary artery disease) 09/09/2007  . Hematuria 09/09/2007  . Depression 03/20/2007  . Hypothyroid 02/13/1999      Past Surgical History:  Procedure Laterality Date  . CESAREAN SECTION    . CHOLECYSTECTOMY N/A 11/24/2012   Procedure: LAPAROSCOPIC CHOLECYSTECTOMY;  Surgeon: Ralene Ok, MD;  Location: [place];  Service: General;  Laterality: N/A;  . COSMETIC SURGERY    . EYE SURGERY    . PTCA      OB History    No data available       Home Medications    Prior to Admission medications   Medication Sig Start Date End Date Taking? Authorizing Provider  alendronate (FOSAMAX) 70 MG tablet Take 1 tablet (70 mg total) by mouth every 7 (seven) days. Take in morning on empty stomach with full glass of water. Stay upright. May eat one hour after taking 12/15/15   [name], MD  aspirin 81 MG EC tablet Take 81 mg by mouth daily.     Historical Provider, MD  cephALEXin (KEFLEX) 500 MG capsule Take 1 capsule (500 mg total) by mouth 2 (two) times daily. 01/06/16 01/11/16  Gibson Ramp, MD  ergocalciferol (VITAMIN D2) 50000 units capsule Take 1 capsule (50,000 Units total) by mouth once a week. 12/15/15 12/14/16  [name], MD  levothyroxine (SYNTHROID, LEVOTHROID) 100 MCG tablet Take 1 tablet (100 mcg total) by mouth daily. 12/15/15   [name], MD  pantoprazole (PROTONIX) 40 MG tablet Take 1 tablet (40 mg total) by mouth daily. 12/15/15   [name], MD  PARoxetine (PAXIL) 20 MG tablet  TAKE 1 TABLET BY MOUTH EVERY MORNING 12/21/15   [name], MD  predniSONE (DELTASONE) 5 MG tablet Take 0.5-1 tablets (2.5-5 mg total) by mouth daily. Full tablet Monday-Friday. Half tablet on Saturday and Sunday. 12/15/15   [name], MD  traMADol (ULTRAM) 50 MG tablet Take 1 tablet (50 mg total) by mouth every 8 (eight) hours as needed. 01/06/16   Sedalia Muta, FNP    Family History Family History  Problem Relation Age of Onset  . Hypertension Father   . Heart disease Father   . Cancer Father   . Hyperlipidemia Father   . Colon cancer Maternal Uncle   . Hypertension  Mother   . Cancer Mother   . Hyperlipidemia Mother     Social History Social History  Substance Use Topics  . Smoking status: Never Smoker  . Smokeless tobacco: Never Used  . Alcohol use No     Allergies   Morphine and related; Prochlorperazine; Simvastatin; and Sulfa antibiotics   Review of Systems Review of Systems  Constitutional: Negative for chills and fever.  HENT: Negative for ear pain and sore throat.   Eyes: Negative for pain and visual disturbance.  Respiratory: Negative for cough and shortness of breath.   Cardiovascular: Negative for chest pain and palpitations.  Gastrointestinal: Positive for abdominal pain. Negative for blood in stool, constipation, diarrhea, nausea and vomiting.  Genitourinary: Negative for difficulty urinating, dysuria and hematuria.  Musculoskeletal: Negative for arthralgias and back pain.  Skin: Negative for color change and rash.  Neurological: Negative for seizures and syncope.  All other systems reviewed and are negative.    Physical Exam Updated Vital Signs BP 132/74   Pulse 61   Temp 98.1 F (36.7 C) (Oral)   Resp 22   Ht 5\' 1"  (1.549 m)   Wt 74.8 kg   SpO2 99%   BMI 31.18 kg/m   Physical Exam  Constitutional: She appears well-developed and well-nourished. She appears distressed (mild distress secondary to pain).  HENT:  Head: Normocephalic and atraumatic.  Eyes: Conjunctivae are normal.  Neck: Neck supple.  Cardiovascular: Normal rate and regular rhythm.   No murmur heard. Pulmonary/Chest: Effort normal and breath sounds normal. No respiratory distress.  Abdominal: Soft. She exhibits no mass. There is tenderness (mild TTP over RLQ, LLQ, suprapubic ). There is no rebound and no guarding.  Musculoskeletal: She exhibits no edema.  Neurological: She is alert.  Skin: Skin is warm and dry.  Psychiatric: She has a normal mood and affect.  Nursing note and vitals reviewed.    ED Treatments / Results  Labs (all labs  ordered are listed, but only abnormal results are displayed) Labs Reviewed  COMPREHENSIVE METABOLIC PANEL - Abnormal; Notable for the following:       Result Value   GFR calc non Af Amer 59 (*)    All other components within normal limits  URINE CULTURE  LIPASE, BLOOD    EKG  EKG Interpretation None       Radiology Ct Abdomen Pelvis W Contrast  Result Date: 01/06/2016 CLINICAL DATA:  Suprapubic pain EXAM: CT ABDOMEN AND PELVIS WITH CONTRAST TECHNIQUE: Multidetector CT imaging of the abdomen and pelvis was performed using the standard protocol following bolus administration of intravenous contrast. CONTRAST:  14mL ISOVUE-300 COMPARISON:  None. FINDINGS: Lower chest: No acute abnormality. Hepatobiliary: The liver is fatty infiltrated. The gallbladder has been surgically removed. Pancreas: Unremarkable. No pancreatic ductal dilatation or surrounding inflammatory changes. Spleen: Normal in  size without focal abnormality. Adrenals/Urinary Tract: Adrenal glands are unremarkable. Kidneys are normal, without renal calculi, focal lesion, or hydronephrosis. Bladder is unremarkable. Stomach/Bowel: Stomach is within normal limits. Appendix appears normal. No evidence of bowel wall thickening, distention, or inflammatory changes. Vascular/Lymphatic: Aortic atherosclerosis. No enlarged abdominal or pelvic lymph nodes. Reproductive: Uterus and bilateral adnexa are unremarkable. Other: No abdominal wall hernia or abnormality. No abdominopelvic ascites. Musculoskeletal: Degenerative changes of lumbar spine are noted. IMPRESSION: Chronic changes as described above.  No acute abnormality noted. Electronically Signed   By: Inez Catalina M.D.   On: 01/06/2016 18:50   Dg Abd 2 Views  Result Date: 01/06/2016 CLINICAL DATA:  Lower abdominal pain starting last night EXAM: ABDOMEN - 2 VIEW COMPARISON:  None. FINDINGS: There is normal small bowel gas pattern. Levoscoliosis lumbar spine. No evidence of free abdominal  air. IMPRESSION: Negative. Electronically Signed   By: Lahoma Crocker M.D.   On: 01/06/2016 13:27    Procedures Procedures (including critical care time)  Medications Ordered in ED Medications  sodium chloride 0.9 % bolus 1,000 mL (0 mLs Intravenous Stopped 01/06/16 2031)  iopamidol (ISOVUE-300) 61 % injection (100 mLs  Contrast Given 01/06/16 1825)  oxyCODONE-acetaminophen (PERCOCET/ROXICET) 5-325 MG per tablet 1 tablet (1 tablet Oral Given 01/06/16 1902)  ketorolac (TORADOL) 15 MG/ML injection 15 mg (15 mg Intravenous Given 01/06/16 2027)  cephALEXin (KEFLEX) capsule 500 mg (500 mg Oral Given 01/06/16 2028)     Initial Impression / Assessment and Plan / ED Course  I have reviewed the triage vital signs and the nursing notes.  Pertinent labs & imaging results that were available during my care of the patient were reviewed by me and considered in my medical decision making (see chart for details).  Clinical Course    Patient is a 45 rolled female with past medical history as above who presents with lower abdominal discomfort since yesterday evening. Afebrile, VSS on arrival. Exam with mild lower abdominal tenderness to palpation without focality or peritoneal signs. By mouth pain medicine given. Labs from urgent care reviewed. Significant for mild leukocytosis, UA with white blood cells and bacteria. Labs obtained in triage reviewed. CMP unremarkable. Lipase normal. Due to ongoing significant discomfort CT abdomen pelvis with contrast was obtained to rule out appendicitis, diverticulitis or other acute pathology. CT scan feels normal appendix, normal kidneys without signs of renal stone or pyelonephritis, no signs of diverticulitis, obstruction or hernia area. Given benign exam, laboratory findings and negative CT scan doubt acute intra-abdominal pathology. On reevaluation patient continues to report significant discomfort. Urine culture sent, given positive UA at urgent care. Will plan to treat  for presumed UTI. IV Toradol and first dose of antibiotics given. Patient reports moderate relief after Toradol. She was discharged in stable condition. She has a prescription for tramadol obtained at urgent care. Prescription for antibiotics given. Advised to take nsaids in addition for discomfort as needed. Strict return precautions given.  Patient seen and discussed with Dr. Sabra Heck, ED attending  Final Clinical Impressions(s) / ED Diagnoses   Final diagnoses:  Abdominal pain, suprapubic    New Prescriptions Discharge Medication List as of 01/06/2016  8:10 PM    START taking these medications   Details  cephALEXin (KEFLEX) 500 MG capsule Take 1 capsule (500 mg total) by mouth 2 (two) times daily., Starting Fri 01/06/2016, Until Wed 01/11/2016, Print         Gibson Ramp, MD 01/07/16 0120    Noemi Chapel, MD 01/07/16 [phone]

## 2016-01-06 NOTE — ED Notes (Signed)
Taken to CT.

## 2016-01-09 LAB — URINE CULTURE: Culture: >=100000 — AB

## 2016-01-10 ENCOUNTER — Telehealth (HOSPITAL_BASED_OUTPATIENT_CLINIC_OR_DEPARTMENT_OTHER): Payer: Self-pay | Admitting: Emergency Medicine

## 2016-01-10 NOTE — Telephone Encounter (Signed)
Post ED Visit - Positive Culture Follow-up  Culture report reviewed by antimicrobial stewardship pharmacist:  []  Elenor Quinones, Pharm.D. []  Heide Guile, Pharm.D., BCPS []  Parks Neptune, Pharm.D. []  Alycia Rossetti, Pharm.D., BCPS []  [place], Pharm.D., BCPS, AAHIVP []  Legrand Como, Pharm.D., BCPS, AAHIVP []  Milus Glazier, Pharm.D. []  Stephens November, [place].D. Apryl Ouida [place]ills PharmD  Positive urine culture Treated with cephalexin, organism sensitive to the same and no further patient follow-up is required at this time.  Hazle Nordmann 01/10/2016, 1:10 PM

## 2016-01-26 ENCOUNTER — Ambulatory Visit: Payer: PPO | Admitting: Cardiology

## 2016-01-26 NOTE — Progress Notes (Signed)
Cardiology Office Note   Date:  01/28/2016   ID:  Meghan STROHSCHEIN, DOB 1950-11-28, MRN [id number]  PCP:  Garret Reddish, MD  Cardiologist:   Minus Breeding, MD  Referring:  Garret Reddish, MD  Chief Complaint  Patient presents with  . Coronary Artery Disease      History of Present Illness: [name] is a 65 y.o. female who presents for follow-up of coronary disease. He's been several years since she was seen. She had a marginal branch myocardial infarction in 2009. There was a long 99% stenosis which was reduced to 50% stenosis with dissection and TIMI-3 flow. She was managed medically this and has done well. She is referred back to [place] because it's been so long. Of note over the years she has been intolerant of 4 statins. 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 With this level of activity she denies any chest pressure, neck or arm discomfort. She's not had any shortness of breath, PND or orthopnea is no palpitations, presyncope or syncope.  Past Medical History:  Diagnosis Date  . Anxiety   . CAD (coronary artery disease)   . Depression   . DISORDER, MENOPAUSAL NOS 03/20/2007   Qualifier: Diagnosis of  By: Arnoldo Morale MD, Balinda Quails   . Hypothyroidism   . MYOCARDIAL INFARCTION, HX OF 09/09/2007   Qualifier: Diagnosis of  By: Leanne Chang MD, Bruce    . Osteoporosis   . Polymyalgia rheumatica ([facility])     Past Surgical History:  Procedure Laterality Date  . CESAREAN SECTION    . CHOLECYSTECTOMY N/A 11/24/2012   Procedure: LAPAROSCOPIC CHOLECYSTECTOMY;  Surgeon: Ralene Ok, MD;  Location: [facility];  Service: General;  Laterality: N/A;  . COSMETIC SURGERY    . EYE SURGERY    . PTCA       Current Outpatient Prescriptions  Medication Sig Dispense Refill  . alendronate (FOSAMAX) 70 MG tablet Take 1 tablet (70 mg total) by mouth every 7 (seven) days. Take in morning on empty stomach with full glass of water. Stay upright. May eat one hour after taking 13 tablet 1  . aspirin  81 MG EC tablet Take 81 mg by mouth daily.     . ergocalciferol (VITAMIN D2) 50000 units capsule Take 1 capsule (50,000 Units total) by mouth once a week. 13 capsule 0  . levothyroxine (SYNTHROID, LEVOTHROID) 100 MCG tablet Take 1 tablet (100 mcg total) by mouth daily. 90 tablet 1  . pantoprazole (PROTONIX) 40 MG tablet Take 1 tablet (40 mg total) by mouth daily. 90 tablet 1  . PARoxetine (PAXIL) 20 MG tablet TAKE 1 TABLET BY MOUTH EVERY MORNING 30 tablet 1  . predniSONE (DELTASONE) 5 MG tablet Take 0.5-1 tablets (2.5-5 mg total) by mouth daily. Full tablet Monday-Friday. Half tablet on Saturday and Sunday. 90 tablet 1   No current facility-administered medications for this visit.     Allergies:   Morphine and related; Prochlorperazine; Simvastatin; and Sulfa antibiotics    Social History:  The patient  reports that she has never smoked. She has never used smokeless tobacco. She reports that she does not drink alcohol or use drugs.   Family History:  The patient's family history includes Cancer in her father and mother; Colon cancer in her maternal uncle; Heart disease in her father; Hyperlipidemia in her father and mother; Hypertension in her father and mother.    ROS:  Please see the history of present illness.   Otherwise, review  of systems are positive for none.   All other systems are reviewed and negative.    PHYSICAL EXAM: VS:  BP 134/82 (BP Location: Right Arm)   Pulse 80   Ht 5' 1.5" (1.562 m)   Wt 165 lb 9.6 oz (75.1 kg)   BMI 30.78 kg/m  , BMI Body mass index is 30.78 kg/m. GENERAL:  Well appearing HEENT:  Pupils equal round and reactive, fundi not visualized, oral mucosa unremarkable NECK:  No jugular venous distention, waveform within normal limits, carotid upstroke brisk and symmetric, no bruits, no thyromegaly LYMPHATICS:  No cervical, inguinal adenopathy LUNGS:  Clear to auscultation bilaterally BACK:  No CVA tenderness CHEST:  Unremarkable HEART:  PMI not  displaced or sustained,S1 and S2 within normal limits, no S3, no S4, no clicks, no rubs, no murmurs ABD:  Flat, positive bowel sounds normal in frequency in pitch, no bruits, no rebound, no guarding, no midline pulsatile mass, no hepatomegaly, no splenomegaly EXT:  2 plus pulses throughout, no edema, no cyanosis no clubbing SKIN:  No rashes no nodules NEURO:  Cranial nerves II through XII grossly intact, motor grossly intact throughout PSYCH:  Cognitively intact, oriented to person place and time    EKG:  EKG is ordered today. The ekg ordered today demonstrates sinus rhythm, rate 70, left axis deviation, low voltage in the chest leads, poor anterior R wave progression, nonspecific T-wave flattening.   Recent Labs: 12/15/2015: Platelets 238.0; TSH 2.81 01/06/2016: ALT 23; BUN 16; Creatinine, Ser 0.98; Hemoglobin 13.8; Potassium 3.8; Sodium 139    Lipid Panel    Component Value Date/Time   CHOL 246 (H) 08/07/2013 0905   TRIG 57.0 08/07/2013 0905   HDL 64.80 08/07/2013 0905   CHOLHDL 4 08/07/2013 0905   VLDL 11.4 08/07/2013 0905   LDLCALC 170 (H) 08/07/2013 0905   LDLDIRECT 176.0 12/15/2015 1103      Wt Readings from Last 3 Encounters:  01/27/16 165 lb 9.6 oz (75.1 kg)  01/06/16 165 lb (74.8 kg)  01/06/16 165 lb (74.8 kg)      Other studies Reviewed: Additional studies/ records that were reviewed today include: None. Review of the above records demonstrates:  Please see elsewhere in the note.     ASSESSMENT AND PLAN:   CAD:  I will bring the patient back for a POET (Plain Old Exercise Test). This will allow me to screen for obstructive coronary disease, risk stratify and very importantly provide a prescription for exercise.     DYSLIPIDEMIA:  She is intolerant of statins. I will refer her to the lipid clinic for consideration of PCSK9 inhibitor.    Current medicines are reviewed at length with the patient today.  The patient does not have concerns regarding  medicines.  The following changes have been made:  no change  Labs/ tests ordered today include: By   Orders Placed This Encounter  Procedures  . EXERCISE TOLERANCE TEST  . EKG 12-Lead     Disposition:   FU with me in one year.     Signed, Minus Breeding, MD  01/28/2016 4:27 PM    [place]

## 2016-01-27 ENCOUNTER — Ambulatory Visit (INDEPENDENT_AMBULATORY_CARE_PROVIDER_SITE_OTHER): Payer: PPO | Admitting: Cardiology

## 2016-01-27 ENCOUNTER — Encounter: Payer: Self-pay | Admitting: Cardiology

## 2016-01-27 VITALS — BP 134/82 | HR 80 | Ht 61.5 in | Wt 165.6 lb

## 2016-01-27 DIAGNOSIS — I2510 Atherosclerotic heart disease of native coronary artery without angina pectoris: Secondary | ICD-10-CM | POA: Diagnosis not present

## 2016-01-27 NOTE — Patient Instructions (Signed)
,  Medication Instructions:  Continue current medications  Labwork: None Ordered  Testing/Procedures: Your physician has requested that you have an exercise tolerance test. For further information please visit [email or web address]. Please also follow instruction sheet, as given.  Follow-Up: Your physician recommends that you schedule a follow-up appointment in: [facility] on same day as Stress Test  Your physician wants you to follow-up in: 1 Year. You will receive a reminder letter in the mail two months in advance. If you don't receive a letter, please call our office to schedule the follow-up appointment.    Any Other Special Instructions Will Be Listed Below (If Applicable).    If you need a refill on your cardiac medications before your next appointment, please call your pharmacy.

## 2016-01-28 ENCOUNTER — Encounter: Payer: Self-pay | Admitting: Cardiology

## 2016-02-24 ENCOUNTER — Ambulatory Visit (INDEPENDENT_AMBULATORY_CARE_PROVIDER_SITE_OTHER): Payer: PPO

## 2016-02-24 ENCOUNTER — Ambulatory Visit (INDEPENDENT_AMBULATORY_CARE_PROVIDER_SITE_OTHER): Payer: PPO | Admitting: Podiatry

## 2016-02-24 ENCOUNTER — Encounter: Payer: Self-pay | Admitting: Podiatry

## 2016-02-24 ENCOUNTER — Encounter (HOSPITAL_COMMUNITY): Payer: PPO

## 2016-02-24 DIAGNOSIS — M84375A Stress fracture, left foot, initial encounter for fracture: Secondary | ICD-10-CM

## 2016-02-24 DIAGNOSIS — M722 Plantar fascial fibromatosis: Secondary | ICD-10-CM

## 2016-02-24 MED ORDER — TRAMADOL HCL 50 MG PO TABS: 50.0000 mg | ORAL_TABLET | Freq: Three times a day (TID) | ORAL | 2 refills | Status: DC

## 2016-02-24 NOTE — Patient Instructions (Signed)

## 2016-02-26 NOTE — Progress Notes (Signed)
Subjective:     Patient ID: [name], female   DOB: 1950-11-05, 66 y.o.   MRN: [id number]  HPI patient states that she's developed a lot of pain in the plantar aspect of the left heel over the last 3 weeks. States this feels worse the plantar fasciitis   Review of Systems     Objective:   Physical Exam Neurovascular status intact negative Homans sign was noted with exquisite discomfort within the body of the calcaneus left with patient also noted to have continued moderate plantar pain but nowhere near as bad as the heel bone itself    Assessment:     Probability for stress fracture the calcaneus due to the nature and intensity of pain versus inflammatory fasciitis    Plan:     H&P x-rays reviewed and air fracture walker was dispensed with all instructions on usage along with aggressive ice therapy. We will be monitoring her closely will be seen back in several weeks and hopefully will respond him mobilization  X-ray indicates suspicion around the posterior aspect of the calcaneus for stress fracture which may become more apparent at next visit

## 2016-02-27 ENCOUNTER — Other Ambulatory Visit: Payer: Self-pay | Admitting: Family Medicine

## 2016-02-29 ENCOUNTER — Ambulatory Visit (INDEPENDENT_AMBULATORY_CARE_PROVIDER_SITE_OTHER): Payer: PPO | Admitting: Orthopaedic Surgery

## 2016-02-29 ENCOUNTER — Encounter (HOSPITAL_COMMUNITY): Payer: PPO

## 2016-03-01 ENCOUNTER — Encounter (HOSPITAL_COMMUNITY): Payer: PPO

## 2016-03-01 ENCOUNTER — Ambulatory Visit: Payer: PPO

## 2016-03-02 ENCOUNTER — Telehealth (HOSPITAL_COMMUNITY): Payer: Self-pay

## 2016-03-02 NOTE — Telephone Encounter (Signed)
Encounter complete. 

## 2016-03-06 ENCOUNTER — Ambulatory Visit (INDEPENDENT_AMBULATORY_CARE_PROVIDER_SITE_OTHER): Payer: PPO

## 2016-03-06 ENCOUNTER — Inpatient Hospital Stay (HOSPITAL_COMMUNITY): Admission: RE | Admit: 2016-03-06 | Payer: PPO | Source: Ambulatory Visit

## 2016-03-06 ENCOUNTER — Telehealth: Payer: Self-pay | Admitting: *Deleted

## 2016-03-06 ENCOUNTER — Encounter: Payer: Self-pay | Admitting: Sports Medicine

## 2016-03-06 ENCOUNTER — Ambulatory Visit (INDEPENDENT_AMBULATORY_CARE_PROVIDER_SITE_OTHER): Payer: PPO | Admitting: Pharmacist

## 2016-03-06 ENCOUNTER — Ambulatory Visit (HOSPITAL_COMMUNITY): Admission: RE | Admit: 2016-03-06 | Payer: PPO | Source: Ambulatory Visit

## 2016-03-06 ENCOUNTER — Ambulatory Visit (INDEPENDENT_AMBULATORY_CARE_PROVIDER_SITE_OTHER): Payer: PPO | Admitting: Sports Medicine

## 2016-03-06 DIAGNOSIS — M7660 Achilles tendinitis, unspecified leg: Secondary | ICD-10-CM | POA: Diagnosis not present

## 2016-03-06 DIAGNOSIS — M84375D Stress fracture, left foot, subsequent encounter for fracture with routine healing: Secondary | ICD-10-CM | POA: Diagnosis not present

## 2016-03-06 DIAGNOSIS — M722 Plantar fascial fibromatosis: Secondary | ICD-10-CM

## 2016-03-06 DIAGNOSIS — E785 Hyperlipidemia, unspecified: Secondary | ICD-10-CM | POA: Diagnosis not present

## 2016-03-06 DIAGNOSIS — M84376A Stress fracture, unspecified foot, initial encounter for fracture: Secondary | ICD-10-CM

## 2016-03-06 DIAGNOSIS — M84375S Stress fracture, left foot, sequela: Secondary | ICD-10-CM

## 2016-03-06 DIAGNOSIS — M79672 Pain in left foot: Secondary | ICD-10-CM

## 2016-03-06 DIAGNOSIS — M779 Enthesopathy, unspecified: Secondary | ICD-10-CM

## 2016-03-06 MED ORDER — DICLOFENAC SODIUM 75 MG PO TBEC: 75.0000 mg | DELAYED_RELEASE_TABLET | Freq: Two times a day (BID) | ORAL | 0 refills | Status: DC

## 2016-03-06 NOTE — Progress Notes (Signed)
Subjective: [name] is a 66 y.o. female patient presents to office with complaint of heel pain on the left that radiated to arch that is not better after wearing CAM boot that she was placed in last visit for concern of stress fracture to heel. Patient states that she has been icing with no improvement. States that she is on Prednisone for PMR with no relief in her heel. Denies any other pedal complaints.   Patient Active Problem List   Diagnosis Date Noted  . GERD (gastroesophageal reflux disease) 12/15/2015  . Polyp of colon, adenomatous 10/11/2014  . Vitamin D deficiency 05/26/2014  . Osteoporosis 05/26/2014  . Polymyalgia rheumatica ([place]) 12/01/2010  . CAD (coronary artery disease) 09/09/2007  . Hematuria 09/09/2007  . Depression 03/20/2007  . Hypothyroid 02/13/1999    Current Outpatient Prescriptions on File Prior to Visit  Medication Sig Dispense Refill  . alendronate (FOSAMAX) 70 MG tablet Take 1 tablet (70 mg total) by mouth every 7 (seven) days. Take in morning on empty stomach with full glass of water. Stay upright. May eat one hour after taking 13 tablet 1  . aspirin 81 MG EC tablet Take 81 mg by mouth daily.     . ergocalciferol (VITAMIN D2) 50000 units capsule Take 1 capsule (50,000 Units total) by mouth once a week. 13 capsule 0  . levothyroxine (SYNTHROID, LEVOTHROID) 100 MCG tablet Take 1 tablet (100 mcg total) by mouth daily. 90 tablet 1  . pantoprazole (PROTONIX) 40 MG tablet Take 1 tablet (40 mg total) by mouth daily. 90 tablet 1  . PARoxetine (PAXIL) 20 MG tablet TAKE 1 TABLET BY MOUTH EVERY MORNING 30 tablet 5  . predniSONE (DELTASONE) 5 MG tablet Take 0.5-1 tablets (2.5-5 mg total) by mouth daily. Full tablet Monday-Friday. Half tablet on Saturday and Sunday. 90 tablet 1  . traMADol (ULTRAM) 50 MG tablet Take 1 tablet (50 mg total) by mouth 3 (three) times daily. 90 tablet 2   No current facility-administered medications on file prior to visit.     Allergies   Allergen Reactions  . Morphine And Related Nausea And Vomiting  . Prochlorperazine     REACTION: nerve reaction  . Simvastatin     REACTION: leg cramps  . Sulfa Antibiotics     Objective: Physical Exam General: The patient is alert and oriented x3 in no acute distress.  Dermatology: Skin is warm, dry and supple bilateral lower extremities. Nails 1-10 are within normal limits. There is no erythema, edema, no eccymosis, no open lesions present. Integument is otherwise unremarkable.  Vascular: Dorsalis Pedis pulse and Posterior Tibial pulse are 1/4 bilateral. Capillary fill time is immediate to all digits.  Neurological: Grossly intact to light touch with an achilles reflex of +2/5 and a negative Tinel's sign bilateral.  Musculoskeletal: Tenderness to palpation at the medial calcaneal tubercale and through the insertion of the plantar fascia on the left foot. + pain with compression of calcaneus on left. Mild pain with tuning fork to calcaneus bilateral. No pain with calf compression bilateral. There is decreased Ankle joint range of motion bilateral, L>R. All other joints range of motion within normal limits bilateral. Strength 5/5 in all groups bilateral.   Gait: CAM boot assisted, Antalgic avoid weight on left heel  Xray, Left foot:  Normal osseous mineralization. Joint spaces preserved except at midfoot where there is evidence of arthritis. No obvious fracture/dislocation/boney destruction.Severe Posterior and inferior Calcaneal spur present with severe thickening of plantar fascia.  No other  soft tissue abnormalities or radiopaque foreign bodies.   Assessment and Plan: Problem List Items Addressed This Visit    None    Visit Diagnoses    Stress fracture of left foot with routine healing, subsequent encounter    -  Primary   Relevant Medications   diclofenac (VOLTAREN) 75 MG EC tablet   Other Relevant Orders   DG Foot Complete Left   Stress fracture of calcaneus due to multiple  or repetitive stress, left, with routine healing, subsequent encounter       Relevant Medications   diclofenac (VOLTAREN) 75 MG EC tablet   Achilles tendinitis, unspecified laterality       Relevant Medications   diclofenac (VOLTAREN) 75 MG EC tablet   Inflammatory heel pain, left       Relevant Medications   diclofenac (VOLTAREN) 75 MG EC tablet      -Complete examination performed.  -Xrays reviewed -Discussed with patient in detail the condition of stress fracture vs plantar fasciitis, how this occurs and general treatment options. Explained both conservative and surgical treatments.  -At this time recommend holding off on any steriod injection until after MRI to r/o stress fracture  -Rx Diclofenac for pain and inflammation -Recommended continue with CAM boot however patient is insisting that this is not helping so dispensed heel cushions to patient and recommend use of these with her tennis shoe. Advised patient that if her MRI is + may require to go back to boot or cast immobilization -Recommend patient to ice affected area 1-2x daily. -Patient to return to office after MRI for follow up or sooner if problems or questions arise.  Landis Martins, DPM

## 2016-03-06 NOTE — Patient Instructions (Signed)
[name]  Call in 2 weeks if needed    Cholesterol Cholesterol is a white, waxy, fat-like substance that is needed by the human body in small amounts. The liver makes all the cholesterol we need. Cholesterol is carried from the liver by the blood through the blood vessels. Deposits of cholesterol (plaques) may build up on blood vessel (artery) walls. Plaques make the arteries narrower and stiffer. Cholesterol plaques increase the risk for heart attack and stroke. You cannot feel your cholesterol level even if it is very high. The only way to know that it is high is to have a blood test. Once you know your cholesterol levels, you should keep a record of the test results. Work with your health care provider to keep your levels in the desired range. What do the results mean?  Total cholesterol is a rough measure of all the cholesterol in your blood.  LDL (low-density lipoprotein) is the "bad" cholesterol. This is the type that causes plaque to build up on the artery walls. You want this level to be low.  HDL (high-density lipoprotein) is the "good" cholesterol because it cleans the arteries and carries the LDL away. You want this level to be high.  Triglycerides are fat that the body can either burn for energy or store. High levels are closely linked to heart disease. What are the desired levels of cholesterol?  Total cholesterol below 200.  LDL below 100 for people who are at risk, below 70 for people at very high risk.  HDL above 40 is good. A level of 60 or higher is considered to be protective against heart disease.  Triglycerides below 150. How can I lower my cholesterol? Diet  Follow your diet program as told by your health care provider.  Choose fish or white meat chicken and [place], roasted or baked. Limit fatty cuts of red meat, fried foods, and processed meats, such as sausage and lunch meats.  Eat lots of fresh fruits and vegetables.  Choose whole grains,  beans, pasta, potatoes, and cereals.  Choose olive oil, corn oil, or canola oil, and use only small amounts.  Avoid butter, mayonnaise, shortening, or palm kernel oils.  Avoid foods with trans fats.  Drink skim or nonfat milk and eat low-fat or nonfat yogurt and cheeses. Avoid whole milk, cream, ice cream, egg yolks, and full-fat cheeses.  Healthier desserts include angel food cake, ginger snaps, animal crackers, hard candy, popsicles, and low-fat or nonfat frozen yogurt. Avoid pastries, cakes, pies, and cookies. Exercise  Follow your exercise program as told by your health care provider. A regular program:  Helps to decrease LDL and raise HDL.  Helps with weight control.  Do things that increase your activity level, such as gardening, walking, and taking the stairs.  Ask your health care provider about ways that you can be more active in your daily life. Medicine  Take over-the-counter and prescription medicines only as told by your health care provider.  Medicine may be prescribed by your health care provider to help lower cholesterol and decrease the risk for heart disease. This is usually done if diet and exercise have failed to bring down cholesterol levels.  If you have several risk factors, you may need medicine even if your levels are normal. This information is not intended to replace advice given to you by your health care provider. Make sure you discuss any questions you have with your health care provider. Document Released: 10/24/2000 Document Revised: 08/27/2015 Document Reviewed: 07/30/2015  Elsevier Interactive Patient Education  2017 Elsevier Inc.  

## 2016-03-06 NOTE — Telephone Encounter (Addendum)
-----   Message from Landis Martins, [place] sent at 03/06/2016 12:25 PM EST ----- Regarding: MRI Left  Eval left heel stress fracture Hx of fasciitis and tendoniitis -Dr. Cannon Kettle. Faxed orders to [facility] and gave to D. [place] for [employer].

## 2016-03-06 NOTE — Progress Notes (Signed)
Patient ID: [name]                 DOB: 03/20/1950                    MRN: [id number]     HPI: [name] is a 66 y.o. female patient referred to lipid clinic by [facility]. PMH includes CAD, MI in 2009 and dyslipidemia.  Patient presents to clinic today for evaluation and possible initiation of PCSK9 inhibitor.  No new complains at this time.  Current Medications: none  Intolerances: simvastatin 20mg  (april/2016), pitavastatin 1-2mg  (July/2012-Oct/2014), Atorvastatin 20mg  ,zetia 10mg  - All medication caused severe myalgia and problems ambulating  Risk Factors: CAD,  Dyslipidemia, MI  LDL goal: < 70  Diet: meals at home , no fried foods  Exercise: walk 30 minutes every day, and work full time  Family History: The patient's family history includes Cancer in her father and mother; Colon cancer in her maternal uncle; Heart disease in her father; Hyperlipidemia in her father and mother; Hypertension in her father and mother.   Social History: The patient  reports that she has never smoked. She has never used smokeless tobacco. She reports that she does not drink alcohol or use drugs.   Labs: June/26/2015: LDL 170, HDL 65, TG 57 Nov/03/2015:  LDL (d) 176   Past Medical History:  Diagnosis Date  . Anxiety   . CAD (coronary artery disease)   . Depression   . DISORDER, MENOPAUSAL NOS 03/20/2007   Qualifier: Diagnosis of  By: Arnoldo Morale MD, Balinda Quails   . Hypothyroidism   . MYOCARDIAL INFARCTION, HX OF 09/09/2007   Qualifier: Diagnosis of  By: Leanne Chang MD, Bruce    . Osteoporosis   . Polymyalgia rheumatica ([place])     Current Outpatient Prescriptions on File Prior to Visit  Medication Sig Dispense Refill  . alendronate (FOSAMAX) 70 MG tablet Take 1 tablet (70 mg total) by mouth every 7 (seven) days. Take in morning on empty stomach with full glass of water. Stay upright. May eat one hour after taking 13 tablet 1  . aspirin 81 MG EC tablet Take 81 mg by mouth daily.     . ergocalciferol  (VITAMIN D2) 50000 units capsule Take 1 capsule (50,000 Units total) by mouth once a week. 13 capsule 0  . levothyroxine (SYNTHROID, LEVOTHROID) 100 MCG tablet Take 1 tablet (100 mcg total) by mouth daily. 90 tablet 1  . pantoprazole (PROTONIX) 40 MG tablet Take 1 tablet (40 mg total) by mouth daily. 90 tablet 1  . PARoxetine (PAXIL) 20 MG tablet TAKE 1 TABLET BY MOUTH EVERY MORNING 30 tablet 5  . predniSONE (DELTASONE) 5 MG tablet Take 0.5-1 tablets (2.5-5 mg total) by mouth daily. Full tablet Monday-Friday. Half tablet on Saturday and Sunday. 90 tablet 1  . traMADol (ULTRAM) 50 MG tablet Take 1 tablet (50 mg total) by mouth 3 (three) times daily. 90 tablet 2   No current facility-administered medications on file prior to visit.     Allergies  Allergen Reactions  . Morphine And Related Nausea And Vomiting  . Prochlorperazine     REACTION: nerve reaction  . Simvastatin     REACTION: leg cramps  . Sulfa Antibiotics     Assessment/Plan:  Hyperlipidemia: Patient presents to clinic for potential PCSK9 inhibitor initiation. LDL (d) of 176 is well above goal of <70 for secondary prevention. Also noted patient documented history of statin intolerance including atorvastatin, simvastatin and  low dose pitavastatin . Intolerance to Zetia also reported by patient.   Will submit paperwork for insurance prior authorization and for [employer] net [employer] to help with copayment.  Will contact patient once insurance response obtained and if any additional follow up information needed.   Sabria Florido Rodriguez-Guzman PharmD, [place] [id number] 03/06/2016 3:50 PM

## 2016-03-08 ENCOUNTER — Telehealth: Payer: Self-pay | Admitting: *Deleted

## 2016-03-08 ENCOUNTER — Other Ambulatory Visit: Payer: Self-pay | Admitting: Sports Medicine

## 2016-03-08 NOTE — Telephone Encounter (Signed)
"  She's scheduled for MRI left ankle on Sat.  She has [employer] and it needs authorization."

## 2016-03-10 ENCOUNTER — Other Ambulatory Visit: Payer: PPO

## 2016-03-10 ENCOUNTER — Inpatient Hospital Stay: Admission: RE | Admit: 2016-03-10 | Payer: PPO | Source: Ambulatory Visit

## 2016-03-14 ENCOUNTER — Other Ambulatory Visit: Payer: PPO

## 2016-03-14 ENCOUNTER — Ambulatory Visit (INDEPENDENT_AMBULATORY_CARE_PROVIDER_SITE_OTHER): Payer: PPO | Admitting: Orthopaedic Surgery

## 2016-03-14 ENCOUNTER — Ambulatory Visit: Payer: PPO | Admitting: Podiatry

## 2016-03-15 ENCOUNTER — Telehealth: Payer: Self-pay | Admitting: Pharmacist

## 2016-03-15 MED ORDER — EVOLOCUMAB 140 MG/ML ~~LOC~~ SOAJ: 1.0000 "pen " | SUBCUTANEOUS | 11 refills | Status: DC

## 2016-03-15 NOTE — Telephone Encounter (Signed)
Pre-Authorization for Repatha was approved by insurance. Rx sent to [facility] and waiting for copay information.  Will try to contact patient again tomorrow for update

## 2016-03-16 ENCOUNTER — Telehealth: Payer: Self-pay | Admitting: Cardiology

## 2016-03-16 NOTE — Telephone Encounter (Signed)
New Message    Returning your call about the [place],

## 2016-03-16 NOTE — Telephone Encounter (Signed)
LMTCB.  Need to verify co-pay for Repatha before submitting paperwork for patient assistant.  Direct number for pharmacist clinic [phone] provided to patient to call back

## 2016-03-16 NOTE — Telephone Encounter (Signed)
Patient will call back with information about her co-pay for repatha. If unable to afford. We fax paperwork for [employer].

## 2016-03-21 ENCOUNTER — Ambulatory Visit (INDEPENDENT_AMBULATORY_CARE_PROVIDER_SITE_OTHER): Payer: PPO | Admitting: Orthopaedic Surgery

## 2016-03-21 ENCOUNTER — Telehealth: Payer: Self-pay | Admitting: Cardiology

## 2016-03-21 NOTE — Telephone Encounter (Signed)
Information provided to [place]

## 2016-03-21 NOTE — Telephone Encounter (Signed)
New Message  Pt c/o medication issue:  1. Name of Medication: Evolocumab (repatha sureclick) [id number] mg/ml SOAJ inject into skin every 14 days.  2. How are you currently taking this medication (dosage and times per day)? See above  3. Are you having a reaction (difficulty breathing--STAT)? N/A  4. What is your medication issue? Pharmacist voiced she is needing allergies and ICD 10 code for this patient.

## 2016-03-23 ENCOUNTER — Other Ambulatory Visit: Payer: Self-pay | Admitting: Family Medicine

## 2016-03-26 ENCOUNTER — Other Ambulatory Visit: Payer: Self-pay | Admitting: Family Medicine

## 2016-03-27 ENCOUNTER — Ambulatory Visit: Payer: PPO | Admitting: Sports Medicine

## 2016-03-27 NOTE — Telephone Encounter (Signed)
MRI was authorized by [facility].

## 2016-03-31 ENCOUNTER — Ambulatory Visit
Admission: RE | Admit: 2016-03-31 | Discharge: 2016-03-31 | Disposition: A | Discharge status: Home / Self Care | Payer: PPO | Source: Ambulatory Visit | Attending: Sports Medicine | Admitting: Sports Medicine

## 2016-03-31 DIAGNOSIS — M79672 Pain in left foot: Secondary | ICD-10-CM | POA: Diagnosis not present

## 2016-04-05 ENCOUNTER — Ambulatory Visit (INDEPENDENT_AMBULATORY_CARE_PROVIDER_SITE_OTHER): Payer: PPO | Admitting: Orthopaedic Surgery

## 2016-04-05 ENCOUNTER — Ambulatory Visit (INDEPENDENT_AMBULATORY_CARE_PROVIDER_SITE_OTHER): Payer: PPO | Admitting: Podiatry

## 2016-04-05 ENCOUNTER — Encounter: Payer: Self-pay | Admitting: Podiatry

## 2016-04-05 DIAGNOSIS — M722 Plantar fascial fibromatosis: Secondary | ICD-10-CM

## 2016-04-05 MED ORDER — TRIAMCINOLONE ACETONIDE 10 MG/ML IJ SUSP
10.0000 mg | Freq: Once | INTRAMUSCULAR | Status: AC
  Administered 2016-04-05: 10 mg

## 2016-04-06 DIAGNOSIS — H524 Presbyopia: Secondary | ICD-10-CM | POA: Diagnosis not present

## 2016-04-06 DIAGNOSIS — H2513 Age-related nuclear cataract, bilateral: Secondary | ICD-10-CM | POA: Diagnosis not present

## 2016-04-06 DIAGNOSIS — H25012 Cortical age-related cataract, left eye: Secondary | ICD-10-CM | POA: Diagnosis not present

## 2016-04-06 NOTE — Progress Notes (Signed)
Subjective:     Patient ID: [name], female   DOB: 1950/10/31, 66 y.o.   MRN: [id number]  HPI patient presents stating that her heel is still bothering her and we have the results of the MRI   Review of Systems     Objective:   Physical Exam Neurovascular status unchanged with negative Homans sign noted and patient found to have discomfort plantar heel which remains quite intense but no longer hurting in the bone structure itself    Assessment:     Probability for plantar fasciitis based on response of MRI versus stress fracture    Plan:     Reviewed condition and recommended injection which was accomplished with bracing. May require other treatments depending on response and we'll continue boot as best as possible

## 2016-04-10 ENCOUNTER — Ambulatory Visit: Payer: PPO | Admitting: Sports Medicine

## 2016-04-17 ENCOUNTER — Other Ambulatory Visit: Payer: Self-pay | Admitting: Family Medicine

## 2016-04-17 DIAGNOSIS — Z1231 Encounter for screening mammogram for malignant neoplasm of breast: Secondary | ICD-10-CM

## 2016-04-23 ENCOUNTER — Encounter: Payer: Self-pay | Admitting: Podiatry

## 2016-04-23 ENCOUNTER — Ambulatory Visit (INDEPENDENT_AMBULATORY_CARE_PROVIDER_SITE_OTHER): Payer: PPO | Admitting: Podiatry

## 2016-04-23 DIAGNOSIS — M722 Plantar fascial fibromatosis: Secondary | ICD-10-CM | POA: Diagnosis not present

## 2016-04-23 MED ORDER — TRIAMCINOLONE ACETONIDE 10 MG/ML IJ SUSP
10.0000 mg | Freq: Once | INTRAMUSCULAR | Status: AC
  Administered 2016-04-23: 10 mg

## 2016-04-23 NOTE — Progress Notes (Signed)
Subjective:     Patient ID: [name], female   DOB: Jun 28, 1950, 66 y.o.   MRN: [id number]  HPI patient states it some better but still sore and painful if she's on it too long   Review of Systems     Objective:   Physical Exam Neurovascular status intact negative Homans sign was noted with patient found to have continued discomfort left plantar fashion with mild improvement from previous visit    Assessment:     Inflammatory fasciitis left with mild improvement    Plan:     Discussed importance of wearing boot and reinjected the plantar fascia 3 mg Kenalog 5 mg Xylocaine if symptoms persist to a significant nature we will need to consider shockwave therapy for this patient

## 2016-05-01 DIAGNOSIS — H25012 Cortical age-related cataract, left eye: Secondary | ICD-10-CM | POA: Diagnosis not present

## 2016-05-01 DIAGNOSIS — H25812 Combined forms of age-related cataract, left eye: Secondary | ICD-10-CM | POA: Diagnosis not present

## 2016-05-01 DIAGNOSIS — H2512 Age-related nuclear cataract, left eye: Secondary | ICD-10-CM | POA: Diagnosis not present

## 2016-05-02 ENCOUNTER — Telehealth: Payer: Self-pay | Admitting: Pharmacist Clinician (PhC)/ Clinical Pharmacy Specialist

## 2016-05-02 NOTE — Telephone Encounter (Signed)
Patient called, LMOM about Repatha injections.  She received first shipment, but is having trouble getting first pen to activate.  Returned call, [facility]

## 2016-05-03 NOTE — Telephone Encounter (Signed)
Spoke with patient later same day.  She reviewed information and was able to get injection done without problem

## 2016-05-09 ENCOUNTER — Ambulatory Visit (INDEPENDENT_AMBULATORY_CARE_PROVIDER_SITE_OTHER): Payer: PPO

## 2016-05-09 DIAGNOSIS — I2510 Atherosclerotic heart disease of native coronary artery without angina pectoris: Secondary | ICD-10-CM

## 2016-05-09 LAB — EXERCISE TOLERANCE TEST
CHL CUP MPHR: 154 {beats}/min
CHL CUP RESTING HR STRESS: 71 {beats}/min
CHL CUP STRESS STAGE 1 DBP: 96 mmHg
CHL CUP STRESS STAGE 1 SBP: 143 mmHg
CHL CUP STRESS STAGE 1 SPEED: 0 mph
CHL CUP STRESS STAGE 2 GRADE: 0 %
CHL CUP STRESS STAGE 2 SPEED: 1 mph
CHL CUP STRESS STAGE 4 GRADE: 10 %
CHL CUP STRESS STAGE 4 SPEED: 1.7 mph
CHL CUP STRESS STAGE 5 GRADE: 12 %
CHL CUP STRESS STAGE 5 HR: 87 {beats}/min
CHL CUP STRESS STAGE 5 SPEED: 2.5 mph
CHL CUP STRESS STAGE 6 DBP: 121 mmHg
CHL CUP STRESS STAGE 6 GRADE: 0 %
CHL CUP STRESS STAGE 6 SBP: 160 mmHg
CHL CUP STRESS STAGE 6 SPEED: 0 mph
CHL CUP STRESS STAGE 7 DBP: 97 mmHg
CHL CUP STRESS STAGE 7 SBP: 149 mmHg
CSEPED: 5 min
CSEPHR: 78 %
CSEPPHR: 87 {beats}/min
Estimated workload: 7 METS
Exercise duration (sec): 44 s
Percent of predicted max HR: 56 %
RPE: 17
Stage 1 Grade: 0 %
Stage 1 HR: 81 {beats}/min
Stage 2 HR: 76 {beats}/min
Stage 3 Grade: 0 %
Stage 3 HR: 75 {beats}/min
Stage 3 Speed: 1 mph
Stage 4 DBP: 83 mmHg
Stage 4 HR: 96 {beats}/min
Stage 4 SBP: 157 mmHg
Stage 5 DBP: 71 mmHg
Stage 5 SBP: 221 mmHg
Stage 6 HR: 87 {beats}/min
Stage 7 Grade: 0 %
Stage 7 HR: 71 {beats}/min
Stage 7 Speed: 0 mph

## 2016-05-18 ENCOUNTER — Telehealth: Payer: Self-pay | Admitting: Cardiology

## 2016-05-18 NOTE — Telephone Encounter (Signed)
Patient notified of GXT results

## 2016-05-18 NOTE — Telephone Encounter (Signed)
New message      Calling to get GXT results

## 2016-05-28 ENCOUNTER — Encounter: Payer: Self-pay | Admitting: Podiatry

## 2016-05-28 ENCOUNTER — Ambulatory Visit: Payer: PPO

## 2016-05-28 ENCOUNTER — Ambulatory Visit (INDEPENDENT_AMBULATORY_CARE_PROVIDER_SITE_OTHER): Payer: PPO | Admitting: Podiatry

## 2016-05-28 DIAGNOSIS — M722 Plantar fascial fibromatosis: Secondary | ICD-10-CM

## 2016-05-28 DIAGNOSIS — Z1231 Encounter for screening mammogram for malignant neoplasm of breast: Secondary | ICD-10-CM | POA: Diagnosis not present

## 2016-05-28 LAB — HM MAMMOGRAPHY

## 2016-05-29 NOTE — Progress Notes (Signed)
Subjective: [name] presents to the office today for follow-up evaluation of left heel pain. They state that they are doing a little better but she is still having pain. She is asking for another injection. They have been icing, stretching, try to wear supportive shoe as much as possible. She states she is on her feet all day. She has not tried orthtoics. She has not been to PT or tried EPAT. No other complaints at this time. No acute changes since last appointment. They deny any systemic complaints such as fevers, chills, nausea, vomiting.  Objective: General: AAO x3, NAD  Dermatological: Skin is warm, dry and supple bilateral. Nails x 10 are well manicured; remaining integument appears unremarkable at this time. There are no open sores, no preulcerative lesions, no rash or signs of infection present.  Vascular: Dorsalis Pedis artery and Posterior Tibial artery pedal pulses are 2/4 bilateral with immedate capillary fill time. Pedal hair growth present. There is no pain with calf compression, swelling, warmth, erythema.   Neruologic: Grossly intact via light touch bilateral. Vibratory intact via tuning fork bilateral. Protective threshold with Semmes Wienstein monofilament intact to all pedal sites bilateral.   Musculoskeletal: There is continued tenderness palpation along the plantar medial tubercle of the calcaneus at the insertion of the plantar fascia on the left foot. There is no pain along the course of the plantar fascia within the arch of the foot. Plantar fascia appears to be intact bilaterally. There is no pain with lateral compression of the calcaneus and there is no pain with vibratory sensation. There is no pain along the course or insertion of the Achilles tendon. There are no other areas of tenderness to bilateral lower extremities. No gross boney pedal deformities bilateral. No pain, crepitus, or limitation noted with foot and ankle range of motion bilateral. Muscular strength 5/5  in all groups tested bilateral.  Gait: Unassisted, Nonantalgic.   Assessment: Presents for follow-up evaluation for chronic heel pain, likely plantar fasciitis   Plan: -Treatment options discussed including all alternatives, risks, and complications -Patient elects to proceed with steroid injection into the left heel. Under sterile skin preparation, a total of 2.5cc of kenalog 10, 0.5% Marcaine plain, and 2% lidocaine plain were infiltrated into the symptomatic area without complication. A band-aid was applied. Patient tolerated the injection well without complication. Post-injection care with discussed with the patient. Discussed with the patient to ice the area over the next couple of days to help prevent a steroid flare.  -Recommended orthotics. She was measured for orthotics and they were sent to [facility] labs.  -If symptoms continue will do EPAT.  -Ice and stretching exercises on a daily basis. -Continue supportive shoe gear. -Follow-up in 3 weeks or sooner if any problems arise. In the meantime, encouraged to call the office with any questions, concerns, change in symptoms.   Celesta Gentile, DPM

## 2016-06-05 ENCOUNTER — Encounter: Payer: Self-pay | Admitting: Family Medicine

## 2016-06-12 DIAGNOSIS — H2511 Age-related nuclear cataract, right eye: Secondary | ICD-10-CM | POA: Diagnosis not present

## 2016-06-12 DIAGNOSIS — H25811 Combined forms of age-related cataract, right eye: Secondary | ICD-10-CM | POA: Diagnosis not present

## 2016-06-15 ENCOUNTER — Telehealth: Payer: Self-pay | Admitting: *Deleted

## 2016-06-15 NOTE — Telephone Encounter (Signed)
[facility] states received a request for multilple procedures to include orthotics from [facility] on 05/28/2016 and received another, please have Lattie Haw contact me to verify.

## 2016-07-02 ENCOUNTER — Emergency Department (HOSPITAL_COMMUNITY): Payer: PPO

## 2016-07-02 ENCOUNTER — Emergency Department (HOSPITAL_COMMUNITY)
Admission: EM | Admit: 2016-07-02 | Discharge: 2016-07-02 | Disposition: A | Discharge status: Home / Self Care | Payer: PPO | Attending: Emergency Medicine | Admitting: Emergency Medicine

## 2016-07-02 ENCOUNTER — Other Ambulatory Visit: Payer: PPO

## 2016-07-02 ENCOUNTER — Encounter (HOSPITAL_COMMUNITY): Payer: Self-pay

## 2016-07-02 DIAGNOSIS — Z79899 Other long term (current) drug therapy: Secondary | ICD-10-CM | POA: Insufficient documentation

## 2016-07-02 DIAGNOSIS — I2510 Atherosclerotic heart disease of native coronary artery without angina pectoris: Secondary | ICD-10-CM | POA: Diagnosis not present

## 2016-07-02 DIAGNOSIS — M62838 Other muscle spasm: Secondary | ICD-10-CM

## 2016-07-02 DIAGNOSIS — Z7982 Long term (current) use of aspirin: Secondary | ICD-10-CM | POA: Diagnosis not present

## 2016-07-02 DIAGNOSIS — M25511 Pain in right shoulder: Secondary | ICD-10-CM

## 2016-07-02 DIAGNOSIS — T148XXA Other injury of unspecified body region, initial encounter: Secondary | ICD-10-CM | POA: Diagnosis not present

## 2016-07-02 DIAGNOSIS — M778 Other enthesopathies, not elsewhere classified: Secondary | ICD-10-CM

## 2016-07-02 DIAGNOSIS — M7581 Other shoulder lesions, right shoulder: Secondary | ICD-10-CM

## 2016-07-02 DIAGNOSIS — E039 Hypothyroidism, unspecified: Secondary | ICD-10-CM | POA: Diagnosis not present

## 2016-07-02 DIAGNOSIS — M7591 Shoulder lesion, unspecified, right shoulder: Secondary | ICD-10-CM | POA: Insufficient documentation

## 2016-07-02 DIAGNOSIS — I252 Old myocardial infarction: Secondary | ICD-10-CM | POA: Insufficient documentation

## 2016-07-02 MED ORDER — LIDOCAINE 5 % EX PTCH
1.0000 | MEDICATED_PATCH | CUTANEOUS | 0 refills | Status: AC
Start: 2016-07-02 — End: 2016-07-07

## 2016-07-02 MED ORDER — LIDOCAINE 5 % EX PTCH
1.0000 | MEDICATED_PATCH | CUTANEOUS | Status: DC
  Administered 2016-07-02: 1 via TRANSDERMAL
  Filled 2016-07-02: qty 1

## 2016-07-02 MED ORDER — LIDOCAINE 4 % EX PTCH: 1.0000 | MEDICATED_PATCH | Freq: Every day | CUTANEOUS | 0 refills | Status: DC

## 2016-07-02 MED ORDER — OXYCODONE HCL 5 MG PO TABS: 5.0000 mg | ORAL_TABLET | Freq: Four times a day (QID) | ORAL | 0 refills | Status: DC | PRN

## 2016-07-02 MED ORDER — NAPROXEN 250 MG PO TABS: 500.0000 mg | ORAL_TABLET | Freq: Two times a day (BID) | ORAL | 0 refills | Status: DC

## 2016-07-02 MED ORDER — OXYCODONE HCL 5 MG PO TABS: 5.0000 mg | ORAL_TABLET | Freq: Four times a day (QID) | ORAL | 0 refills | Status: AC | PRN

## 2016-07-02 MED ORDER — METHOCARBAMOL 500 MG PO TABS: 500.0000 mg | ORAL_TABLET | Freq: Four times a day (QID) | ORAL | 0 refills | Status: AC | PRN

## 2016-07-02 MED ORDER — ACETAMINOPHEN 500 MG PO TABS
1000.0000 mg | ORAL_TABLET | Freq: Once | ORAL | Status: AC
  Administered 2016-07-02: 1000 mg via ORAL
  Filled 2016-07-02: qty 2

## 2016-07-02 NOTE — ED Triage Notes (Signed)
Per EMS, pt from home.  Pt was using yard equipment on Thursday.  Was pulling arms for pruning and trimming.  Felt pulled muscle.  Pt able to work Friday, Saturday, Sunday.  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  Pt c/o rt shoulder pain.  Minimal movement d/t pain.  Pt went to [facility] last night but left b/c wait was going to be too long.  Pt taking OTC with no relief.  No falls.  Vitals:  Hr 71, 97% ra, 124/79, cbg 92.  IV LAC 120mcg fentanyl given  Now 5/10 pain

## 2016-07-02 NOTE — ED Provider Notes (Signed)
[facility] DEPT Provider Note   CSN: [id number] Arrival date & time: 07/02/16  1005  By signing my name below, I, Levester Fresh, attest that this documentation has been prepared under the direction and in the presence of Carmon Sails, PA-C. Electronically Signed: Levester Fresh, Scribe. 07/02/2016. 11:10 AM.  History   Chief Complaint Chief Complaint  Patient presents with  . Shoulder Pain   HPI Comments [name] is a 66 y.o. female with no significant PMHx, who presents to the Emergency Department with complaints of constant right shoulder pain x4 days, after feeling like she strained her muscle doing yard work. 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 Pain worse with movement of arm and neck rotation to right. Sx with minimal relief by OTC medications, including ibuprofen, however, was given fentanyl en route and reports improvement of sx to 5/10 in severity. No falls, neck or head trauma, hx of car accidents. No hx of right shoulder injury. Sensation intact to the right hand.  Pt denies experiencing any other acute sx, including chest pain, dyspnea, reflux, nausea or vomiting. No neck pain. No numbness or tingling of the right hand. Pt is right handed.   The history is provided by the patient and the EMS personnel. No language interpreter was used.   Past Medical History:  Diagnosis Date  . Anxiety   . CAD (coronary artery disease)   . Depression   . DISORDER, MENOPAUSAL NOS 03/20/2007   Qualifier: Diagnosis of  By: Arnoldo Morale MD, Balinda Quails   . Hypothyroidism   . MYOCARDIAL INFARCTION, HX OF 09/09/2007   Qualifier: Diagnosis of  By: Leanne Chang MD, Bruce    . Osteoporosis   . Polymyalgia rheumatica [facility])     Patient Active Problem List   Diagnosis Date Noted  . GERD (gastroesophageal reflux disease) 12/15/2015  . Polyp of colon, adenomatous 10/11/2014  . Vitamin D deficiency 05/26/2014  . Osteoporosis 05/26/2014  . Polymyalgia rheumatica ([place])  12/01/2010  . CAD (coronary artery disease) 09/09/2007  . Hematuria 09/09/2007  . Depression 03/20/2007  . Hypothyroid 02/13/1999    Past Surgical History:  Procedure Laterality Date  . CESAREAN SECTION    . CHOLECYSTECTOMY N/A 11/24/2012   Procedure: LAPAROSCOPIC CHOLECYSTECTOMY;  Surgeon: Ralene Ok, MD;  Location: [place];  Service: General;  Laterality: N/A;  . COSMETIC SURGERY    . EYE SURGERY    . PTCA      OB History    No data available      Home Medications    Prior to Admission medications   Medication Sig Start Date End Date Taking? Authorizing Provider  alendronate (FOSAMAX) 70 MG tablet TAKE 1 TABLET BY MOUTH EVERY 7 DAYS. TAKE ON EMPTY stomach WITH full GLASS OF WATER. stay upright. 03/23/16  Yes [name], MD  aspirin 81 MG EC tablet Take 81 mg by mouth daily.    Yes [provider]  ergocalciferol (VITAMIN D2) 50000 units capsule Take 1 capsule (50,000 Units total) by mouth once a week. 12/15/15 12/14/16 Yes [name], MD  Evolocumab (REPATHA SURECLICK) 001 MG/ML SOAJ Inject 1 pen into the skin every 14 (fourteen) days. 03/15/16  Yes Minus Breeding, MD  levothyroxine (SYNTHROID, LEVOTHROID) 100 MCG tablet TAKE 1 TABLET BY MOUTH EVERY DAY 03/23/16  Yes [name], MD  pantoprazole (PROTONIX) 40 MG tablet TAKE 1 TABLET BY MOUTH EVERY DAY 03/23/16  Yes [name], MD  PARoxetine (PAXIL)  20 MG tablet TAKE 1 TABLET BY MOUTH EVERY MORNING 02/27/16  Yes [name], MD  predniSONE (DELTASONE) 5 MG tablet TAKE 1 TABLET BY MOUTH monday-Friday AND TAKE one half tablet ON saturday AND SUNDAY 03/26/16  Yes [name], MD  traMADol (ULTRAM) 50 MG tablet Take 1 tablet (50 mg total) by mouth 3 (three) times daily. 02/24/16  Yes Regal, Tamala Fothergill, DPM  diclofenac (VOLTAREN) 75 MG EC tablet Take 1 tablet (75 mg total) by mouth 2 (two) times daily. Patient not taking: Reported on 07/02/2016 03/06/16   Landis Martins, DPM  lidocaine (LIDODERM)  5 % Place 1 patch onto the skin daily. Remove & Discard patch within 12 hours or as directed by MD 07/02/16 07/07/16  Kinnie Feil, PA-C  methocarbamol (ROBAXIN) 500 MG tablet Take 1 tablet (500 mg total) by mouth every 6 (six) hours as needed for muscle spasms. 07/02/16 07/07/16  Kinnie Feil, PA-C  naproxen (NAPROSYN) 250 MG tablet Take 2 tablets (500 mg total) by mouth 2 (two) times daily with a meal. 07/02/16   Kinnie Feil, PA-C  oxyCODONE (ROXICODONE) 5 MG immediate release tablet Take 1 tablet (5 mg total) by mouth every 6 (six) hours as needed for severe pain. 07/02/16 07/05/16  Kinnie Feil, PA-C    Family History Family History  Problem Relation Age of Onset  . Hypertension Father   . Heart disease Father        Mi age 90, 53, 38 and 37.  . Cancer Father   . Hyperlipidemia Father   . Colon cancer Maternal Uncle   . Hypertension Mother   . Cancer Mother   . Hyperlipidemia Mother     Social History Social History  Substance Use Topics  . Smoking status: Never Smoker  . Smokeless tobacco: Never Used  . Alcohol use No    Allergies   Morphine and related; Prochlorperazine; Simvastatin; and Sulfa antibiotics  Review of Systems Review of Systems  Respiratory: Negative for shortness of breath.   Cardiovascular: Negative for chest pain.  Gastrointestinal: Negative for abdominal pain, nausea and vomiting.  Musculoskeletal: Positive for arthralgias. Negative for neck pain.  Neurological: Negative for weakness and numbness.  All other systems reviewed and are negative.   Physical Exam Updated Vital Signs BP (!) 141/76 (BP Location: Left Arm)   Pulse 69   Temp 98.1 F (36.7 C) (Oral)   Resp 16   Ht 5\' 1"  (1.549 m)   Wt 72.6 kg (160 lb)   SpO2 100%   BMI 30.23 kg/m   Physical Exam  Constitutional: She is oriented to person, place, and time. She appears well-developed and well-nourished. No distress.  HENT:  Head: Normocephalic and atraumatic.    Cardiovascular: Normal rate, regular rhythm, S1 normal, S2 normal and normal heart sounds.   No murmur heard. Pulses:      Radial pulses are 2+ on the right side, and 2+ on the left side.  Pulmonary/Chest: Effort normal and breath sounds normal.  Musculoskeletal: She exhibits tenderness. She exhibits no edema.       Right shoulder: She exhibits decreased range of motion and tenderness.  + Tenderness to trapezius, lateral deltoid and biceps tendon  + Decreased passiveROM of shoulder 2/2 pain  + Positive empty can test, hawkin's sign, neer's sign, drop arm test, liftoff test  + Positive yergason's test and speed's test Negative sulcus sign No obvious skin abnormalities including abrasions, ecchymosis, erythema, edema No point tenderness to  sternum, anterior chest wall, scapula, clavicle, AC or Clio joints  Neurological: She is alert and oriented to person, place, and time.  5/5 strength with shoulder raise, abduction and adduction, bilaterally.  5/5 strength with elbow flexion and extension, bilaterally.  5/5 strength with wrist flexion and extension.  5/5 strength with finger abduction Good pincer and hand grip bilaterally.  Sensation to light touch intact in median, ulnar and radial nerve distribution, bilaterally.   Skin: Skin is warm and dry.  Psychiatric: She has a normal mood and affect.  Nursing note and vitals reviewed.   ED Treatments / Results  DIAGNOSTIC STUDIES: Oxygen Saturation is 95% on RA, adequate by my interpretation.    COORDINATION OF CARE: 10:46 AM Discussed treatment plan with pt at bedside and pt agreed to plan.  Labs (all labs ordered are listed, but only abnormal results are displayed) Labs Reviewed - No data to display  EKG  EKG Interpretation None      Radiology Dg Shoulder Right  Result Date: 07/02/2016 CLINICAL DATA:  Right shoulder pain. pulled muscle doing yard work yesterday. Initial encounter. EXAM: RIGHT SHOULDER - 2+ VIEW COMPARISON:   05/07/2003 FINDINGS: No fracture or dislocation. No degenerative narrowing or spurring. Hazy density along the distal rotator cuff, not seen previously. IMPRESSION: Probable calcific tendinitis of the rotator cuff. No acute osseous finding. Electronically Signed   By: Monte Fantasia M.D.   On: 07/02/2016 10:46    Procedures Procedures (including critical care time)  Medications Ordered in ED Medications  lidocaine (LIDODERM) 5 % 1 patch (1 patch Transdermal Patch Applied 07/02/16 1121)  acetaminophen (TYLENOL) tablet 1,000 mg (1,000 mg Oral Given 07/02/16 1121)     Initial Impression / Assessment and Plan / ED Course  I have reviewed the triage vital signs and the nursing notes.  Pertinent labs & imaging results that were available during my care of the patient were reviewed by me and considered in my medical decision making (see chart for details).  Patient with right shoulder pain. Presentation concerning for MSK pathology. No falls or preceding direct trauma.  Patient was doing yard work and using large clippers the day prior to onset.  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  She was able to work the days after sx onset. X-rays reviewed, shows evidence of calcific tendonitis. Decreased R shoulder ROM 2/2 on exam, other RUE is NVI. Will treat inflammation and pain and discharge with ortho f/u for further tx. Discussed RICE and pain medication. ED return precautions given. Patient appears reliable and expressed understanding to the discharge instructions.   Clinical Course as of Jul 03 1342  Mon Jul 02, 2016  1051 FINDINGS: No fracture or dislocation. No degenerative narrowing or spurring. Hazy density along the distal rotator cuff, not seen previously.  IMPRESSION: Probable calcific tendinitis of the rotator cuff. No acute osseous finding. DG Shoulder Right [CG]    Clinical Course User Index [CG] Kinnie Feil, PA-C    Final Clinical  Impressions(s) / ED Diagnoses   Final diagnoses:  Acute pain of right shoulder  Muscle spasm of right shoulder  Tendonitis of shoulder, right    New Prescriptions Discharge Medication List as of 07/02/2016 11:17 AM    START taking these medications   Details  methocarbamol (ROBAXIN) 500 MG tablet Take 1 tablet (500 mg total) by mouth every 6 (six) hours as needed for muscle spasms., Starting Mon 07/02/2016, Until Sat 07/07/2016, Print  naproxen (NAPROSYN) 250 MG tablet Take 2 tablets (500 mg total) by mouth 2 (two) times daily with a meal., Starting Mon 07/02/2016, Print    Lidocaine 4 % PTCH Apply 1 patch topically daily., Starting Mon 07/02/2016, Until Thu 07/05/2016, Print    oxyCODONE (ROXICODONE) 5 MG immediate release tablet Take 1 tablet (5 mg total) by mouth every 6 (six) hours as needed for severe pain., Starting Mon 07/02/2016, Until Thu 07/05/2016, Print       I personally performed the services described in this documentation, which was scribed in my presence. The recorded information has been reviewed and is accurate.    Kinnie Feil, PA-C 07/02/16 1344    Carmin Muskrat, MD 07/05/16 2352

## 2016-07-02 NOTE — Discharge Instructions (Signed)
Your x-ray showed an area of calcification near the area of one of your shoulder tendons. This is likely calcific tendonitis. Your recent activity probably exacerbated this condition process.   Initial treatment of this condition entails anti-inflammatory medications and rest. Contact Dr. Lorin Mercy and make an appointment for re-evaluation within 7-10 days.  You may contact your primary care provider, as Dr. Lorin Mercy may want to do an MRI of your shoulder to rule out other soft tissues injuries.  Medical control of symptoms and inflammation: Please take Naproxen twice a day with meals (instead of ibuprofen to avoid gastrointestinal complications).  Take 1000 mg tylenol every 8 hours Take oxycodone 5 mg every 6 hours for severe or break through pain. Be cautious of this medicine as it can cause drowsiness and can put you at risk for falls Place lidocaine patch to area of pain. Change daily. Use as needed for additional pain control Take Robaxin (muscle relaxer) to address any associated muscle spasms Rest, wear your shoulder brace Avoid activities that exacerbate the pain Place ice over shoulder to decrease inflammation  Return to the emergency department is symptoms worsen, you develop weakness, tingling or numbness to your right upper extremity

## 2016-07-02 NOTE — ED Notes (Signed)
Bed: WTR6 Expected date:  Expected time:  Means of arrival:  Comments: 

## 2016-07-11 ENCOUNTER — Telehealth: Payer: Self-pay | Admitting: Pharmacist

## 2016-07-11 DIAGNOSIS — I2510 Atherosclerotic heart disease of native coronary artery without angina pectoris: Secondary | ICD-10-CM

## 2016-07-11 NOTE — Telephone Encounter (Signed)
LMOM; patient to repeat lipid panel and LFT within next 2 weeks (3 months after initiation of repatha).    Order enter in Epic to be completed as soon as possible.

## 2016-07-12 ENCOUNTER — Telehealth: Payer: Self-pay | Admitting: Cardiology

## 2016-07-12 DIAGNOSIS — E785 Hyperlipidemia, unspecified: Secondary | ICD-10-CM

## 2016-07-12 DIAGNOSIS — R7989 Other specified abnormal findings of blood chemistry: Secondary | ICD-10-CM

## 2016-07-12 NOTE — Telephone Encounter (Signed)
Left a message to call back, per DPR. 

## 2016-07-12 NOTE — Telephone Encounter (Signed)
New message    Pt is calling to find out if she can add a vitamin d level to her labs scheduled next week.

## 2016-07-13 NOTE — Telephone Encounter (Signed)
Spoke with pt, aware order placed for vit d level.

## 2016-07-16 ENCOUNTER — Ambulatory Visit: Payer: Self-pay | Admitting: Family Medicine

## 2016-07-18 ENCOUNTER — Other Ambulatory Visit: Payer: Self-pay

## 2016-07-18 DIAGNOSIS — E785 Hyperlipidemia, unspecified: Secondary | ICD-10-CM | POA: Diagnosis not present

## 2016-07-18 DIAGNOSIS — E559 Vitamin D deficiency, unspecified: Secondary | ICD-10-CM | POA: Diagnosis not present

## 2016-07-19 LAB — LIPID PANEL
Chol/HDL Ratio: 2.4 ratio (ref 0.0–4.4)
Cholesterol, Total: 151 mg/dL (ref 100–199)
HDL: 64 mg/dL (ref >39)
LDL Calculated: 74 mg/dL (ref 0–99)
Triglycerides: 67 mg/dL (ref 0–149)
VLDL Cholesterol Cal: 13 mg/dL (ref 5–40)

## 2016-07-19 LAB — VITAMIN D 25 HYDROXY (VIT D DEFICIENCY, FRACTURES): VIT D 25 HYDROXY: 22 ng/mL — AB (ref 30.0–100.0)

## 2016-07-27 ENCOUNTER — Ambulatory Visit: Payer: Self-pay | Admitting: Family Medicine

## 2016-08-01 ENCOUNTER — Other Ambulatory Visit: Payer: Self-pay | Admitting: Family Medicine

## 2016-08-01 ENCOUNTER — Telehealth: Payer: Self-pay | Admitting: Cardiology

## 2016-08-01 MED ORDER — ERGOCALCIFEROL 1.25 MG (50000 UT) PO CAPS: 50000.0000 [IU] | ORAL_CAPSULE | ORAL | 0 refills | Status: DC

## 2016-08-01 NOTE — Telephone Encounter (Signed)
Patient calling in regards to lab results, thanks.

## 2016-08-01 NOTE — Telephone Encounter (Signed)
Spoke with patient, LDL dropped from 170 to 74 with Repatha 140 mg q14 days.  Patient pleased with results, states no problems getting medication or administering.     Vitamin D level also drawn, patient asked if we could forward to Dr. Yong Channel at [place].

## 2016-08-01 NOTE — Telephone Encounter (Signed)
Dr. Percival Spanish posted note on recent lipid & liver stating pt is followed in lipid clinic. Will forward to pharmD to review and advise.

## 2016-08-03 ENCOUNTER — Ambulatory Visit (HOSPITAL_COMMUNITY)
Admission: EM | Admit: 2016-08-03 | Discharge: 2016-08-03 | Disposition: A | Discharge status: Home / Self Care | Payer: PPO | Attending: Family Medicine | Admitting: Family Medicine

## 2016-08-03 ENCOUNTER — Encounter (HOSPITAL_COMMUNITY): Payer: Self-pay | Admitting: *Deleted

## 2016-08-03 ENCOUNTER — Telehealth: Payer: Self-pay

## 2016-08-03 DIAGNOSIS — M25511 Pain in right shoulder: Secondary | ICD-10-CM

## 2016-08-03 DIAGNOSIS — M7581 Other shoulder lesions, right shoulder: Secondary | ICD-10-CM

## 2016-08-03 MED ORDER — KETOROLAC TROMETHAMINE 30 MG/ML IJ SOLN
30.0000 mg | Freq: Once | INTRAMUSCULAR | Status: AC
  Administered 2016-08-03: 30 mg via INTRAMUSCULAR

## 2016-08-03 MED ORDER — DICLOFENAC SODIUM 75 MG PO TBEC: 75.0000 mg | DELAYED_RELEASE_TABLET | Freq: Two times a day (BID) | ORAL | 0 refills | Status: DC

## 2016-08-03 MED ORDER — KETOROLAC TROMETHAMINE 30 MG/ML IJ SOLN
INTRAMUSCULAR | Status: AC
  Filled 2016-08-03: qty 1

## 2016-08-03 MED ORDER — OXYCODONE HCL 5 MG PO TABA
5.0000 mg | ORAL_TABLET | Freq: Four times a day (QID) | ORAL | 0 refills | Status: DC
Start: 2016-08-03 — End: 2016-08-31

## 2016-08-03 NOTE — ED Provider Notes (Signed)
CSN: [id number]     Arrival date & time 08/03/16  2778 History   First MD Initiated Contact with Patient 08/03/16 1020     Chief Complaint  Patient presents with  . Shoulder Pain   (Consider location/radiation/quality/duration/timing/severity/associated sxs/prior Treatment) [name] is a 66 y.o. female with a past history of anxiety, coronary artery disease, depression, hypothyroid, osteoporosis, who presents to the [facility] urgent care with a chief complaint of right shoulder pain. She was seen in the ER and evaluated on 07/02/2016 for similar pain following doing yard work, x-rays were obtained, showed calcified tendinitis. She is prescribed oxycodone, Naprosyn, and given follow-up guidelines. States her pain was much improved, however it returned this morning. She denies any recent trauma, has not fallen, had other source of injuries. 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 She has no constitutional symptoms such as fever or chills, or GI such as nausea or vomiting, or other markers of any systemic illness.   The history is provided by the patient.    Past Medical History:  Diagnosis Date  . Anxiety   . CAD (coronary artery disease)   . Depression   . DISORDER, MENOPAUSAL NOS 03/20/2007   Qualifier: Diagnosis of  By: Arnoldo Morale MD, Balinda Quails   . Hypothyroidism   . MYOCARDIAL INFARCTION, HX OF 09/09/2007   Qualifier: Diagnosis of  By: Leanne Chang MD, Bruce    . Osteoporosis   . Polymyalgia rheumatica ([facility])    Past Surgical History:  Procedure Laterality Date  . CESAREAN SECTION    . CHOLECYSTECTOMY N/A 11/24/2012   Procedure: LAPAROSCOPIC CHOLECYSTECTOMY;  Surgeon: Ralene Ok, MD;  Location: [facility];  Service: General;  Laterality: N/A;  . COSMETIC SURGERY    . EYE SURGERY    . PTCA     Family History  Problem Relation Age of Onset  . Hypertension Father   . Heart disease Father        Mi age  35, 52, 56 and 83.  . Cancer Father   . Hyperlipidemia Father   . Colon cancer Maternal Uncle   . Hypertension Mother   . Cancer Mother   . Hyperlipidemia Mother    Social History  Substance Use Topics  . Smoking status: Never Smoker  . Smokeless tobacco: Never Used  . Alcohol use No   OB History    No data available     Review of Systems  Constitutional: Negative.   HENT: Negative.   Respiratory: Negative.   Cardiovascular: Negative.   Gastrointestinal: Negative.   Musculoskeletal:       Shoulder pain  Skin: Negative.   Neurological: Negative.     Allergies  Morphine and related; Prochlorperazine; Simvastatin; and Sulfa antibiotics  Home Medications   Prior to Admission medications   Medication Sig Start Date End Date Taking? Authorizing Provider  alendronate (FOSAMAX) 70 MG tablet TAKE 1 TABLET BY MOUTH EVERY 7 DAYS. TAKE ON EMPTY stomach WITH full GLASS OF WATER. stay upright. 03/23/16   [name], MD  aspirin 81 MG EC tablet Take 81 mg by mouth daily.     [provider]  diclofenac (VOLTAREN) 75 MG EC tablet Take 1 tablet (75 mg total) by mouth 2 (two) times daily. 08/03/16   Barnet Glasgow, NP  ergocalciferol (VITAMIN D2) 50000 units capsule Take 1 capsule (50,000 Units total) by mouth once a week. 08/01/16 08/01/17  [name], MD  Evolocumab (REPATHA SURECLICK) 161 MG/ML SOAJ Inject 1 pen into the skin every 14 (fourteen) days. 03/15/16   Minus Breeding, MD  levothyroxine (SYNTHROID, LEVOTHROID) 100 MCG tablet TAKE 1 TABLET BY MOUTH EVERY DAY 03/23/16   [name], MD  OxyCODONE HCl, Abuse Deter, (OXAYDO) 5 MG TABA Take 5 mg by mouth 4 (four) times daily. 08/03/16   Barnet Glasgow, NP  pantoprazole (PROTONIX) 40 MG tablet TAKE 1 TABLET BY MOUTH EVERY DAY 03/23/16   [name], MD  PARoxetine (PAXIL) 20 MG tablet TAKE 1 TABLET BY MOUTH EVERY MORNING 02/27/16   [name], MD  traMADol (ULTRAM) 50 MG tablet Take 1 tablet (50  mg total) by mouth 3 (three) times daily. 02/24/16   Wallene Huh, DPM   Meds Ordered and Administered this Visit   Medications  ketorolac (TORADOL) 30 MG/ML injection 30 mg (30 mg Intramuscular Given 08/03/16 1043)    BP (!) 155/86 (BP Location: Left Arm) Comment: reported elevated BP to nurse Balinda Quails  Pulse 88   Temp 98.5 F (36.9 C) (Oral)   Resp 18   SpO2 97%  No data found.   Physical Exam  Constitutional: She is oriented to person, place, and time. She appears well-developed and well-nourished. No distress.  HENT:  Head: Normocephalic and atraumatic.  Right Ear: External ear normal.  Left Ear: External ear normal.  Eyes: Conjunctivae are normal.  Neck: Normal range of motion.  Cardiovascular: Normal rate and regular rhythm.   Pulmonary/Chest: Effort normal and breath sounds normal.  Musculoskeletal: She exhibits tenderness. She exhibits no deformity.  Positive empty can test, positive Neer's test, noted reduced passive range of motion of the right shoulder, and tenderness with palpation of the infraspinatus muscle, and tenderness at the insertion of the biceps tendon.  Neurological: She is alert and oriented to person, place, and time.  Skin: Skin is warm and dry. Capillary refill takes less than 2 seconds. No rash noted. She is not diaphoretic. No erythema.  Psychiatric: She has a normal mood and affect. Her behavior is normal.  Nursing note and vitals reviewed.   Urgent Care Course     Procedures (including critical care time)  Labs Review Labs Reviewed - No data to display  Imaging Review No results found.     MDM   1. Right rotator cuff tendinitis     [name] is a 66 y.o. female with a past history of anxiety, coronary artery disease, depression, hypothyroid, osteoporosis, who presents to the [facility] urgent care with a chief complaint of right shoulder pain. She was seen in the ER and evaluated on 07/02/2016 for similar pain following  doing yard work, x-rays were obtained, showed calcified tendinitis. She is prescribed oxycodone, Naprosyn, and given follow-up guidelines. States her pain was much improved, however it returned this morning. She denies any recent trauma, has not fallen, had other source of injuries. 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 She has no constitutional symptoms such as fever or chills, or GI such as nausea or vomiting, or other markers of any systemic illness.  Based on signs, symptoms, and physical exam findings, differential includes rotator cuff tear, rotator cuff impingement, rotator cuff tendinitis. Previous x-rays from her visit in May were reviewed, there is calcification within the tendon structure of the rotator cuff, most likely diagnosis is rotator cuff  tendinitis, given diclofenac, short course of oxycodone, encouraged to follow up with orthopedics for further evaluation and management.   [place] controlled substances reporting system consulted prior to issuing prescription, with the following findings below:  Tramadol that was prescribed on 07/18/2016, and oxycodone that was prescribed on 07/02/2016.    Barnet Glasgow, NP 08/03/16 1202

## 2016-08-03 NOTE — Telephone Encounter (Signed)
Called and left a detailed voicemail message for patient to let her know that Dr. Yong Channel had sent in the Vit D prescription to the pharmacy due to her low Vit D level. I left a call back number for her to return phone call

## 2016-08-03 NOTE — ED Triage Notes (Signed)
Pt   Was   Seen  sev  Weeks  Ago      For r  shoulder  Tendonitis      Denies  Any  Injury  Since  Pain is  Worse  On movement pain radiates   Down  Fingers  And  neck

## 2016-08-03 NOTE — Discharge Instructions (Signed)
You have an injury within your rotator cuff. This could very well be the tendinitis that is evident on her previous x-ray, or it may also be a tear, or impingement on the nerves. The next step in evaluation, would be to contact her orthopedist, and schedule follow-up appointment sometime in the future. I recommend rest, ice, elevation when possible, the use and wearing of your shoulder sling you have at home, I prescribed an anti-inflammatory called diclofenac, take one tablet twice daily. I have also prescribed a medicine for pain called oxycodone, this medicine is a narcotic, it will cause drowsiness, and it is addictive. Do not take more than what is necessary, do not drink alcohol while taking, and do not operate any heavy machinery while taking this medicine. Follow-up with orthopedics in 1-2 weeks

## 2016-08-06 ENCOUNTER — Ambulatory Visit (INDEPENDENT_AMBULATORY_CARE_PROVIDER_SITE_OTHER): Payer: PPO | Admitting: Orthopedic Surgery

## 2016-08-27 ENCOUNTER — Other Ambulatory Visit: Payer: Self-pay | Admitting: Family Medicine

## 2016-08-31 ENCOUNTER — Ambulatory Visit: Payer: PPO | Admitting: Family Medicine

## 2016-08-31 ENCOUNTER — Ambulatory Visit (INDEPENDENT_AMBULATORY_CARE_PROVIDER_SITE_OTHER): Payer: PPO | Admitting: Family Medicine

## 2016-08-31 ENCOUNTER — Encounter: Payer: Self-pay | Admitting: Family Medicine

## 2016-08-31 VITALS — BP 130/78 | HR 80 | Temp 98.3°F | Ht 61.0 in | Wt 160.2 lb

## 2016-08-31 DIAGNOSIS — M353 Polymyalgia rheumatica: Secondary | ICD-10-CM

## 2016-08-31 DIAGNOSIS — M5442 Lumbago with sciatica, left side: Secondary | ICD-10-CM | POA: Diagnosis not present

## 2016-08-31 DIAGNOSIS — M5441 Lumbago with sciatica, right side: Secondary | ICD-10-CM

## 2016-08-31 NOTE — Patient Instructions (Signed)
We will try to get you in within a week to Dr. Lorin Mercy- may be worth you calling this afternoon as well and telling them referral is in. I am worried this could be coming from your back with positive sraight leg raise test.   See [place] back for new or worsening symptoms  I wonder about having you see Dr. Estanislado Pandy there who is a rheumatologist that is excellent with your ongoing PMR issues and difficulty getting you off the prednisone. May be worth asking Dr. Lorin Mercy about this

## 2016-08-31 NOTE — Progress Notes (Addendum)
Subjective:  [name] is a 66 y.o. year old very pleasant female patient who presents for/with See problem oriented charting ROS- no fecal or urinary incontinence. No saddle anesthesia. Admits to some low back pain.    Past Medical History-  Patient Active Problem List   Diagnosis Date Noted  . Polymyalgia rheumatica ([place]) 12/01/2010    Priority: High  . CAD (coronary artery disease) 09/09/2007    Priority: High  . Osteoporosis 05/26/2014    Priority: Medium  . Depression 03/20/2007    Priority: Medium  . Hypothyroid 02/13/1999    Priority: Medium  . Polyp of colon, adenomatous 10/11/2014    Priority: Low  . Vitamin D deficiency 05/26/2014    Priority: Low  . Hematuria 09/09/2007    Priority: Low  . GERD (gastroesophageal reflux disease) 12/15/2015    Medications- reviewed and updated Current Outpatient Prescriptions  Medication Sig Dispense Refill  . alendronate (FOSAMAX) 70 MG tablet TAKE 1 TABLET BY MOUTH EVERY 7 DAYS. TAKE ON EMPTY stomach WITH full GLASS OF WATER. stay upright. 12 tablet 2  . aspirin 81 MG EC tablet Take 81 mg by mouth daily.     . ergocalciferol (VITAMIN D2) 50000 units capsule Take 1 capsule (50,000 Units total) by mouth once a week. 13 capsule 0  . Evolocumab (REPATHA SURECLICK) 478 MG/ML SOAJ Inject 1 pen into the skin every 14 (fourteen) days. 2 pen 11  . levothyroxine (SYNTHROID, LEVOTHROID) 100 MCG tablet TAKE 1 TABLET BY MOUTH EVERY DAY 90 tablet 2  . pantoprazole (PROTONIX) 40 MG tablet TAKE 1 TABLET BY MOUTH EVERY DAY 90 tablet 2  . PARoxetine (PAXIL) 20 MG tablet TAKE 1 TABLET BY MOUTH EVERY MORNING 30 tablet 5   No current facility-administered medications for this visit.     Objective: BP 130/78 (BP Location: Left Arm, Patient Position: Sitting, Cuff Size: Large)   Pulse 80   Temp 98.3 F (36.8 C) (Oral)   Ht _0  (1.549 m)   Wt 160 lb 3.2 oz (72.7 kg)   SpO2 92%   BMI 30.27 kg/m  Gen: NAD, resting comfortably CV: RRR no  murmurs rubs or gallops Lungs: CTAB no crackles, wheeze, rhonchi Ext: no edema Skin: warm, dry Neuro: 5/5 strength in bilateral lower extremities. Positive straight leg raise for pain and paresthesias into legs. Normal sensation to gross touch and temperature at ankles.  Msk: no pain to palpation of low back- no pain to palpation of hamstrings  Assessment/Plan:  Acute bilateral low back pain with bilateral sciatica - Plan: Ambulatory referral to Orthopedics S: several weeks of pain in back of legs and feels weak as well- gets better with movement. Worse in the mornings. Not having shoulder stiffness. Has been on 43m a day- increased to 26ma day for about a week and didn't help- went back to 65m46m day. Having some back pain with it- has 2 bulging discs. Dr. YatLorin Mercyd been managing [facility] at [facility]. No incontinence- fecal or urinary. No paresthesias, no saddle anesthesia. No fever or chills. Some tingling into legs. Feels like pain making it harder for her to walk. Mainly pain seems to make her feel weak she believes.   Has had PMR for several years and never seen rheumatology.   Cardiology had prescribed repatha- has been doing monthly for 6 months. Took a week off to see if that would help and has not.  A/P: Patient with bilateral pain in her hamstring areas  worse on left with pain going up into back. History of bulging discs. Positive straight leg raise- suspect could be related to this. Refer her back to Dr. Lorin Mercy who has evaluated her in the past. Offered steroid injection but given no improvement on prednisone 67m for a week she opts not to do this. Will be worth getting updated films with her osteoporosis- may need repeat MRI like she had years ago.   Could be related to her polymyalgia rheumatica but suspect less likely (does concern me that feels pain and stiffness after sitting for a while and gets better with activity)- she has not had good follow up for this in  reducing prednisone efforts- I think she should consider seeing Dr. DEstanislado Pandywith [facility] as well to get her opinion if current issues could be related to PMR and for help on getting off prednisone . Doubt this is related to her repatha  In retrospection after visit- should have obtained ESR and/or CRP  Patient Instructions  We will try to get you in within a week to Dr. YLorin Mercy may be worth you calling this afternoon as well and telling them referral is in. I am worried this could be coming from your back with positive sraight leg raise test.   See [facility] for new or worsening symptoms  I wonder about having you see Dr. DEstanislado Pandythere who is a rheumatologist that is excellent with your ongoing PMR issues and difficulty getting you off the prednisone. May be worth asking Dr. YLorin Mercyabout this   Orders Placed This Encounter  Procedures  . Ambulatory referral to Orthopedics    Referral Priority:   Routine    Referral Type:   Consultation    Referred to Provider:   YMarybelle Killings MD   Return precautions advised.  SGarret Reddish MD

## 2016-09-05 NOTE — Telephone Encounter (Signed)
Yes thanks- may fill. Please also follow up with Neoma Laming about referral as we had requested within a week due to severity of her pain.

## 2016-09-06 ENCOUNTER — Telehealth: Payer: Self-pay

## 2016-09-06 NOTE — Telephone Encounter (Signed)
Can you follow up to see if they got her scheduled?  Per Dr. Yong Channel: Please also follow up with Neoma Laming about referral as we had requested within a week due to severity of her pain. This was from 08/31/16  Thanks!

## 2016-10-30 ENCOUNTER — Ambulatory Visit (INDEPENDENT_AMBULATORY_CARE_PROVIDER_SITE_OTHER): Payer: PPO | Admitting: *Deleted

## 2016-10-30 DIAGNOSIS — Z23 Encounter for immunization: Secondary | ICD-10-CM | POA: Diagnosis not present

## 2016-11-01 ENCOUNTER — Telehealth: Payer: Self-pay | Admitting: Family Medicine

## 2016-11-01 NOTE — Telephone Encounter (Signed)
Pt needs her immunization records to be fax to [place] [phone]

## 2016-11-02 NOTE — Telephone Encounter (Signed)
Faxed as requested

## 2016-11-06 ENCOUNTER — Telehealth: Payer: Self-pay | Admitting: Family Medicine

## 2016-11-06 NOTE — Telephone Encounter (Signed)
Pt would like to have documentation stating that she received the flu shot from our office faxed to [phone] (F) Attn:  Enid Derry.

## 2016-11-06 NOTE — Telephone Encounter (Signed)
Faxed as requested

## 2016-11-14 ENCOUNTER — Other Ambulatory Visit: Payer: Self-pay | Admitting: Family Medicine

## 2017-01-24 ENCOUNTER — Telehealth: Payer: Self-pay | Admitting: Pharmacist

## 2017-01-24 MED ORDER — EVOLOCUMAB 140 MG/ML ~~LOC~~ SOAJ: 1.0000 "pen " | SUBCUTANEOUS | 11 refills | Status: DC

## 2017-01-24 NOTE — Telephone Encounter (Signed)
LMOM;  Insurance approved [name] for another year. Need compete [employer] safetyNet Form if unable to afford medication

## 2017-01-29 ENCOUNTER — Other Ambulatory Visit: Payer: Self-pay | Admitting: Family Medicine

## 2017-04-01 ENCOUNTER — Encounter: Payer: Self-pay | Admitting: Family Medicine

## 2017-04-01 ENCOUNTER — Ambulatory Visit (INDEPENDENT_AMBULATORY_CARE_PROVIDER_SITE_OTHER): Payer: PPO | Admitting: Family Medicine

## 2017-04-01 ENCOUNTER — Ambulatory Visit: Payer: Self-pay | Admitting: *Deleted

## 2017-04-01 VITALS — BP 110/86 | HR 69 | Temp 98.1°F | Ht 61.0 in | Wt 161.0 lb

## 2017-04-01 DIAGNOSIS — H8111 Benign paroxysmal vertigo, right ear: Secondary | ICD-10-CM

## 2017-04-01 MED ORDER — MECLIZINE HCL 12.5 MG PO TABS: 12.5000 mg | ORAL_TABLET | Freq: Three times a day (TID) | ORAL | 0 refills | Status: DC | PRN

## 2017-04-01 NOTE — Patient Instructions (Signed)
Meclizine 12.5-25mg  as needed up to 3x a day  Trying to get you in for physical therapy tomorrow morning if we can (sometimes it takes a few sessions).   Can do home exercises as well until then- definitely do the ones for the right side- I think that's the side your issue is on - can also try to the left.   Today you stated:  No facial or extremity weakness. No slurred words or trouble swallowing. no blurry vision or double vision. No paresthesias. No confusion or word finding difficulties.   If any of those occurs- please call 911. I strongly doubt stroke with normal neurological exam today   Benign Positional Vertigo Vertigo is the feeling that you or your surroundings are moving when they are not. Benign positional vertigo is the most common form of vertigo. The cause of this condition is not serious (is benign). This condition is triggered by certain movements and positions (is positional). This condition can be dangerous if it occurs while you are doing something that could endanger you or others, such as driving. What are the causes? In many cases, the cause of this condition is not known. It may be caused by a disturbance in an area of the inner ear that helps your brain to sense movement and balance. This disturbance can be caused by a viral infection (labyrinthitis), head injury, or repetitive motion. What increases the risk? This condition is more likely to develop in:  Women.  People who are 68 years of age or older.  What are the signs or symptoms? Symptoms of this condition usually happen when you move your head or your eyes in different directions. Symptoms may start suddenly, and they usually last for less than a minute. Symptoms may include:  Loss of balance and falling.  Feeling like you are spinning or moving.  Feeling like your surroundings are spinning or moving.  Nausea and vomiting.  Blurred vision.  Dizziness.  Involuntary eye movement  (nystagmus).  Symptoms can be mild and cause only slight annoyance, or they can be severe and interfere with daily life. Episodes of benign positional vertigo may return (recur) over time, and they may be triggered by certain movements. Symptoms may improve over time. How is this diagnosed? This condition is usually diagnosed by medical history and a physical exam of the head, neck, and ears. You may be referred to a health care provider who specializes in ear, nose, and throat (ENT) problems (otolaryngologist) or a provider who specializes in disorders of the nervous system (neurologist). You may have additional testing, including:  MRI.  A CT scan.  Eye movement tests. Your health care provider may ask you to change positions quickly while he or she watches you for symptoms of benign positional vertigo, such as nystagmus. Eye movement may be tested with an electronystagmogram (ENG), caloric stimulation, the Dix-Hallpike test, or the roll test.  An electroencephalogram (EEG). This records electrical activity in your brain.  Hearing tests.  How is this treated? Usually, your health care provider will treat this by moving your head in specific positions to adjust your inner ear back to normal. Surgery may be needed in severe cases, but this is rare. In some cases, benign positional vertigo may resolve on its own in 2-4 weeks. Follow these instructions at home: Safety  Move slowly.Avoid sudden body or head movements.  Avoid driving.  Avoid operating heavy machinery.  Avoid doing any tasks that would be dangerous to you or others if  a vertigo episode would occur.  If you have trouble walking or keeping your balance, try using a cane for stability. If you feel dizzy or unstable, sit down right away.  Return to your normal activities as told by your health care provider. Ask your health care provider what activities are safe for you. General instructions  Take over-the-counter and  prescription medicines only as told by your health care provider.  Avoid certain positions or movements as told by your health care provider.  Drink enough fluid to keep your urine clear or pale yellow.  Keep all follow-up visits as told by your health care provider. This is important. Contact a health care provider if:  You have a fever.  Your condition gets worse or you develop new symptoms.  Your family or friends notice any behavioral changes.  Your nausea or vomiting gets worse.  You have numbness or a "pins and needles" sensation. Get help right away if:  You have difficulty speaking or moving.  You are always dizzy.  You faint.  You develop severe headaches.  You have weakness in your legs or arms.  You have changes in your hearing or vision.  You develop a stiff neck.  You develop sensitivity to light. This information is not intended to replace advice given to you by your health care provider. Make sure you discuss any questions you have with your health care provider. Document Released: 11/06/2005 Document Revised: 07/07/2015 Document Reviewed: 05/24/2014 Elsevier Interactive Patient Education  [employer].

## 2017-04-01 NOTE — Progress Notes (Signed)
Subjective:  [name] is a 67 y.o. year old very pleasant female patient who presents for/with See problem oriented charting ROS- No facial or extremity weakness. No slurred words or trouble swallowing. no blurry vision or double vision. No paresthesias. No confusion or word finding difficulties.   Past Medical History-  Patient Active Problem List   Diagnosis Date Noted  . Polymyalgia rheumatica ([place]) 12/01/2010    Priority: High  . CAD (coronary artery disease) 09/09/2007    Priority: High  . Osteoporosis 05/26/2014    Priority: Medium  . Depression 03/20/2007    Priority: Medium  . Hypothyroid 02/13/1999    Priority: Medium  . Polyp of colon, adenomatous 10/11/2014    Priority: Low  . Vitamin D deficiency 05/26/2014    Priority: Low  . Hematuria 09/09/2007    Priority: Low  . GERD (gastroesophageal reflux disease) 12/15/2015    Medications- reviewed and updated Current Outpatient Medications  Medication Sig Dispense Refill  . alendronate (FOSAMAX) 70 MG tablet TAKE 1 TABLET BY MOUTH EVERY 7 DAYS. TAKE ON EMPTY stomach WITH full GLASS OF WATER. stay upright. 12 tablet 2  . aspirin 81 MG EC tablet Take 81 mg by mouth daily.     . Evolocumab (REPATHA SURECLICK) 161 MG/ML SOAJ Inject 1 pen into the skin every 14 (fourteen) days. 2 pen 11  . levothyroxine (SYNTHROID, LEVOTHROID) 100 MCG tablet TAKE 1 TABLET BY MOUTH EVERY DAY 90 tablet 2  . pantoprazole (PROTONIX) 40 MG tablet TAKE 1 TABLET BY MOUTH EVERY DAY 90 tablet 2  . PARoxetine (PAXIL) 20 MG tablet TAKE 1 TABLET BY MOUTH EVERY MORNING 30 tablet 5  . predniSONE (DELTASONE) 5 MG tablet TAKE 1 TABLET BY MOUTH EVERY DAY MONDAY through [place]. TAKE 1 & 1/2 TABLETS SATURDAY & SUNDAY 90 tablet 0  . meclizine (ANTIVERT) 12.5 MG tablet Take 1-2 tablets (12.5-25 mg total) by mouth 3 (three) times daily as needed for dizziness. 45 tablet 0   No current facility-administered medications for this visit.     Objective: BP  110/86 (BP Location: Left Arm, Patient Position: Sitting, Cuff Size: Large)   Pulse 69   Temp 98.1 F (36.7 C) (Oral)   Ht 5\' 1"  (1.549 m)   Wt 161 lb (73 kg)   SpO2 97%   BMI 30.42 kg/m  Gen: NAD, resting comfortably CV: RRR no murmurs rubs or gallops Lungs: CTAB no crackles, wheeze, rhonchi Abdomen: soft/nontender/nondistended/normal bowel sounds.  Ext: no edema Skin: warm, dry Neuro: CN II-XII intact, sensation and reflexes normal throughout, 5/5 muscle strength in bilateral upper and lower extremities. Normal finger to nose. Normal rapid alternating movements. No pronator drift. Normal romberg. Normal gait.   DIX Hallpike maneuver positive on the right with nystagmus and severe vertigo  Assessment/Plan:  BPPV S: patient started with mild vertigo when getting up from sleep at 3 AM yesterday morning. Only occurs with head movement, resolves when she stays still and looks forward.   No hearing changes. No increased tinnitus.  A/P: Patient presents with vertigo only with head movement which is reproduced with dix hallpike plus has nystagmus to the right on exam- this is classic BPPV. Reassuring neurological exam otherwise. Meclizine prn provided. Refer for PT within [place] hopefully this week and get her back to work ASAP which is her main concern- gave home exercises as well.   Lab/Order associations: Benign paroxysmal positional vertigo of right ear - Plan: Ambulatory referral to Physical Therapy  Meds ordered  this encounter  Medications  . meclizine (ANTIVERT) 12.5 MG tablet    Sig: Take 1-2 tablets (12.5-25 mg total) by mouth 3 (three) times daily as needed for dizziness.    Dispense:  45 tablet    Refill:  0   Time Stamp The duration of face-to-face time during this visit was greater than 25 minutes. Greater than 50% of this time was spent in counseling, explanation of diagnosis, planning of further management, and/or coordination of care including discussion of  potential cuases of vertigo including stroke, reassuring after exam about most likely cause and discussing therapies to help improve this. .    Return precautions advised.  Garret Reddish, MD

## 2017-04-01 NOTE — Telephone Encounter (Signed)
See note

## 2017-04-01 NOTE — Telephone Encounter (Signed)
Pt reports "severe" dizziness, onset yesterday. States "room spinning", worse lying down and with "any movement." Reports mild intermittent nausea, mild headache.  Denies any other symptom; no N/V/D, no one sided weakness or speech difficulties, no change in medications, no visual changes.  Denies any congestion or earache, staying hydrated.  States she has to "hold onto things"  to ambulate. Pt adamantly refuses disposition of ED/UC "I just want to see Dr. Yong Channel."  Appt made for 1300 today with Dr. Yong Channel. Care advice given per protocol, family will drive to appt.. Reason for Disposition . SEVERE dizziness (vertigo) (e.g., unable to walk without assistance)  Answer Assessment - Initial Assessment Questions 1. DESCRIPTION: "Describe your dizziness."     Room spinning, worse lying down 2. VERTIGO: "Do you feel like either you or the room is spinning or tilting?"      Yes 3. LIGHTHEADED: "Do you feel lightheaded?" (e.g., somewhat faint, woozy, weak upon standing)     Positional, "any movement" 4. SEVERITY: "How bad is it?"  "Can you walk?"   - MILD - Feels unsteady but walking normally.   - MODERATE - Feels very unsteady when walking, but not falling; interferes with normal activities (e.g., school, work) .   - SEVERE - Unable to walk without falling (requires assistance).     "Have to hold onto things." 5. ONSET:  "When did the dizziness begin?"     Yesterday 6. AGGRAVATING FACTORS: "Does anything make it worse?" (e.g., standing, change in head position)     Any movement 7. CAUSE: "What do you think is causing the dizziness?"     Unsure 8. RECURRENT SYMPTOM: "Have you had dizziness before?" If so, ask: "When was the last time?" "What happened that time?"     No 9. OTHER SYMPTOMS: "Do you have any other symptoms?" (e.g., headache, weakness, numbness, vomiting, earache)     Nausea   "Comes and goes." Mild headache.  Protocols used: DIZZINESS - VERTIGO-A-AH

## 2017-04-01 NOTE — Telephone Encounter (Signed)
noted 

## 2017-04-02 ENCOUNTER — Ambulatory Visit: Payer: PPO | Attending: Family Medicine | Admitting: Physical Therapy

## 2017-04-02 ENCOUNTER — Encounter: Payer: Self-pay | Admitting: Physical Therapy

## 2017-04-02 ENCOUNTER — Other Ambulatory Visit: Payer: Self-pay

## 2017-04-02 DIAGNOSIS — R42 Dizziness and giddiness: Secondary | ICD-10-CM | POA: Diagnosis not present

## 2017-04-02 DIAGNOSIS — H8113 Benign paroxysmal vertigo, bilateral: Secondary | ICD-10-CM

## 2017-04-02 NOTE — Patient Instructions (Signed)
  Provided with handout for Right Semont maneuver (with instructions like Brandt-Daroff)

## 2017-04-02 NOTE — Therapy (Signed)
[facility] [address] [place], [place], [address] Phone: [phone]   Fax:  [phone]  Physical Therapy Evaluation  Patient Details  Name: [name] MRN: [id number] Date of Birth: Oct 30, 1950 Referring Provider: Garret Reddish   Encounter Date: 04/02/2017  PT End of Session - 04/02/17 1658    Visit Number  1    Number of Visits  1 pt deferred scheduling next appt; will call if needs to be seen    Authorization Type  [employer]    Authorization Time Period  TBD if returns for treatment    PT Start Time  0933    PT Stop Time  1013    PT Time Calculation (min)  40 min    Activity Tolerance  Treatment limited secondary to medical complications (Comment) incr nausea with repositioning maneuver    Behavior During Therapy  Anxious became tearful; refused further repositioning maneuvers       Past Medical History:  Diagnosis Date  . Anxiety   . CAD (coronary artery disease)   . Depression   . DISORDER, MENOPAUSAL NOS 03/20/2007   Qualifier: Diagnosis of  By: Arnoldo Morale MD, Balinda Quails   . Hypothyroidism   . MYOCARDIAL INFARCTION, HX OF 09/09/2007   Qualifier: Diagnosis of  By: Leanne Chang MD, Bruce    . Osteoporosis   . Polymyalgia rheumatica (HCC)     Past Surgical History:  Procedure Laterality Date  . CESAREAN SECTION    . CHOLECYSTECTOMY N/A 11/24/2012   Procedure: LAPAROSCOPIC CHOLECYSTECTOMY;  Surgeon: Ralene Ok, MD;  Location: [facility];  Service: General;  Laterality: N/A;  . COSMETIC SURGERY    . EYE SURGERY    . PTCA      There were no vitals filed for this visit.   Subjective Assessment - 04/02/17 0936    Subjective  Spinning dizziness. Worse whenever I change positions. Never before. Started 2/17. MD gave her exercises but she has not tried them (describes Epley maneuver).     Patient is accompained by:  Family member husband    Pertinent History  polymyalgia rheumatica, CAD, osteoporosis,      Limitations  Standing;House hold activities;Walking    Patient Stated Goals  to stop spinning/feeling awful    Currently in Pain?  Yes    Pain Score  4     Pain Location  Head    Pain Descriptors / Indicators  Dull;Aching    Pain Type  Acute pain    Pain Onset  In the past 7 days    Pain Frequency  Constant    Aggravating Factors   vertigo (normally does not have headaches)         [facility] PT Assessment - 04/02/17 1020      Assessment   Medical Diagnosis  Rt BPPV    Referring Provider  Garret Reddish    Onset Date/Surgical Date  03/31/17    Prior Therapy  none; MD prescribed meclizine, she took one pill       Precautions   Precautions  Fall    Precaution Comments  due to vertigo      Balance Screen   Has the patient fallen in the past 6 months  No      Prior Function   Level of Independence  Independent      Observation/Other Assessments   Focus on Therapeutic Outcomes (FOTO)   NP; too sick to complete (dry heaves, nausea)      Bed  Mobility   Bed Mobility  Rolling Left;Sit to Supine;Left Sidelying to Sit    Rolling Left  4: Min assist assist due to vertigo     Left Sidelying to Sit  3: Mod assist assist due to vertigo     Sit to Supine  4: Min guard assist due to vertigo       Transfers   Transfers  Sit to Stand;Stand to Sit    Sit to Stand  4: Min guard assist due to vertigo     Stand to Sit  4: Min guard assist due to vertigo       Ambulation/Gait   Ambulation/Gait  Yes    Ambulation/Gait Assistance  4: Min guard    Ambulation Distance (Feet)  40 Feet x2    Assistive device  None    Gait Pattern  Step-through pattern;Decreased arm swing - right;Decreased arm swing - left;Shuffle    Ambulation Surface  Level;Indoor    Gait velocity  vrey slow due to not feeling well         Vestibular Assessment - 04/02/17 1013      Vestibular Assessment   General Observation  slow walking, wide base, reaching to hold onto objects      Symptom Behavior   Type of  Dizziness  Spinning    Frequency of Dizziness  daily    Duration of Dizziness  unsure    Aggravating Factors  Lying supine;Sit to stand;Rolling to right;Rolling to left    Relieving Factors  Rest      Positional Testing   Dix-Hallpike  Dix-Hallpike Right      Dix-Hallpike Right   Dix-Hallpike Right Duration  1 min 45 seconds; immediate onset    Dix-Hallpike Right Symptoms  Other (comment) unable to see with eyes clamped shut; no nystagmus when EO         Objective measurements completed on examination: See above findings.       Vestibular Treatment/Exercise - 04/02/17 0001      Vestibular Treatment/Exercise   Vestibular Treatment Provided  Canalith Repositioning;Habituation    Canalith Repositioning  Epley Manuever Right    Habituation Exercises  Comment       EPLEY MANUEVER RIGHT   Number of Reps   1    Overall Response  No change    Response Details   moved straight from Hallpike-Dix to Epley; each progressive step there was delayed onset of spinning and lasted 20-30 seconds; + nausea and upon sitting up +dry heaves      Nestor Lewandowsky   Symptom Description   provided pt with semont maneuver for Rt cupulolithiasis (same directions as would give with Brandt-Daroff)            PT Education - 04/02/17 1026    Education provided  Yes    Education Details  see pt instructions; what is BPPV; how PT can benefit; possible cupulolithiasis and recommend additional PT visits    Person(s) Educated  Spouse;Patient    Methods  Explanation;Demonstration;Handout    Comprehension  Verbalized understanding;Need further instruction                  Plan - 04/02/17 1701    Clinical Impression Statement  Patient referred for PT evaluation with diagnosis of Rt BPPV. Physician's note and pt's subjective reports consistent with BPPV and completed Rt Hallpike-Dix with ++symptoms of vertigo and proceeded right into rt Epley (cannalith repositioning) maneuver. Some question if  patient has cupulolithiasis or other  canal involvement, however pt refused further assessment or treatment due to feeling too ill. Educated on use of meclizine and it's function (MD prescribed for her yesterday). Educated on anticipated improved symptoms after 1-2 hour period, however do not feel she will be completely clear of vertigo based on her symptoms. She deferred further evaluation or treatment at this time. Offered to schedule additional appointments and she did not want to do so at this time. Agreed to call if she decided she wanted to be seen. Will place patient on hold x 30 days and if she returns for treatment will set goals and complete plan at that time.     History and Personal Factors relevant to plan of care:  PMH- polymyalgia rheumatica, CAD, osteoporosis,   Personal factors-coping style    Clinical Presentation  Evolving    Clinical Presentation due to:  unable to fully resolve BPPV (or even complete full assessment) due to nausea and severe vertigo (despite use of meclizine prior to session)    Clinical Decision Making  Low    Rehab Potential  Good    Clinical Impairments Affecting Rehab Potential  anxious     PT Frequency  -- TBD if returns for treatment    PT Duration  -- TBD if returns for treatment    PT Treatment/Interventions  ADLs/Self Care Home Management;Canalith Repostioning;Vestibular    PT Next Visit Plan  reassess Rt posterior BPPV; assess other canals if indicated; set goals    Consulted and Agree with Plan of Care  Patient;Family member/caregiver    Family Member Consulted  husband       Patient will benefit from skilled therapeutic intervention in order to improve the following deficits and impairments:  Decreased activity tolerance, Decreased balance, Difficulty walking, Dizziness  Visit Diagnosis: BPPV (benign paroxysmal positional vertigo), bilateral - Plan: PT plan of care cert/re-cert  Dizziness and giddiness - Plan: PT plan of care  cert/re-cert     Problem List Patient Active Problem List   Diagnosis Date Noted  . GERD (gastroesophageal reflux disease) 12/15/2015  . Polyp of colon, adenomatous 10/11/2014  . Vitamin D deficiency 05/26/2014  . Osteoporosis 05/26/2014  . Polymyalgia rheumatica ([place]) 12/01/2010  . CAD (coronary artery disease) 09/09/2007  . Hematuria 09/09/2007  . Depression 03/20/2007  . Hypothyroid 02/13/1999    Rexanne Mano, PT 04/02/2017, 5:12 PM  [facility] [address] [place], [place], [address] Phone: [phone]   Fax:  [phone]  Name: MEKAYLAH KLICH MRN: [id number] Date of Birth: May 29, 1950

## 2017-04-10 ENCOUNTER — Other Ambulatory Visit: Payer: Self-pay | Admitting: Family Medicine

## 2017-04-18 ENCOUNTER — Other Ambulatory Visit: Payer: Self-pay | Admitting: Family Medicine

## 2017-04-22 ENCOUNTER — Other Ambulatory Visit: Payer: Self-pay | Admitting: Family Medicine

## 2017-04-27 ENCOUNTER — Ambulatory Visit (HOSPITAL_COMMUNITY)
Admission: EM | Admit: 2017-04-27 | Discharge: 2017-04-27 | Disposition: A | Discharge status: Home / Self Care | Payer: PPO | Attending: Family Medicine | Admitting: Family Medicine

## 2017-04-27 ENCOUNTER — Encounter (HOSPITAL_COMMUNITY): Payer: Self-pay

## 2017-04-27 ENCOUNTER — Other Ambulatory Visit: Payer: Self-pay

## 2017-04-27 ENCOUNTER — Ambulatory Visit (INDEPENDENT_AMBULATORY_CARE_PROVIDER_SITE_OTHER): Payer: PPO

## 2017-04-27 DIAGNOSIS — M19072 Primary osteoarthritis, left ankle and foot: Secondary | ICD-10-CM | POA: Diagnosis not present

## 2017-04-27 DIAGNOSIS — M79672 Pain in left foot: Secondary | ICD-10-CM

## 2017-04-27 DIAGNOSIS — M7989 Other specified soft tissue disorders: Secondary | ICD-10-CM

## 2017-04-27 DIAGNOSIS — R2242 Localized swelling, mass and lump, left lower limb: Secondary | ICD-10-CM

## 2017-04-27 MED ORDER — DICLOFENAC SODIUM 75 MG PO TBEC: 75.0000 mg | DELAYED_RELEASE_TABLET | Freq: Two times a day (BID) | ORAL | 0 refills | Status: DC

## 2017-04-27 NOTE — ED Triage Notes (Signed)
Pt presents today with left foot pain and swelling that has been going on since Wednesday. States she did not do anything that she knows of to cause the pain. States that her foot has been hot to touch as well.

## 2017-04-27 NOTE — ED Provider Notes (Signed)
[facility]   [id number] 04/27/17 Arrival Time: 1207  ASSESSMENT & PLAN:  1. Foot pain, left   2. Swelling of left foot    Imaging: Dg Foot Complete Left  Result Date: 04/27/2017 CLINICAL DATA:  Acute onset of left foot pain and swelling 2 days ago without recent injury. Patient unable to completely bear weight and walks with a limp. EXAM: LEFT FOOT - COMPLETE 3+ VIEW COMPARISON:  02/24/2016 and 07/07/2015 from [facility]. FINDINGS: No evidence of acute, subacute or healed fractures. Osseous demineralization. Narrowing of the IP joint spaces of the toes. Narrowing of the first MTP joint space. Remaining joint spaces well-preserved. Diffuse soft tissue swelling involving the dorsum and the plantar surface of the foot. Large plantar calcaneal spur. Small enthesopathic spur at the insertion of the Achilles tendon on the posterior calcaneus. Dystrophic calcification posteriorly at the ankle joint and dystrophic calcifications involving the plantar fascia. IMPRESSION: 1. No acute osseous abnormality. 2. Osteoarthritis involving the IP joints of all the toes and involving the first MTP joint. 3. Osseous demineralization. 4. Diffuse soft tissue swelling involving the dorsal and plantar surface of the foot. Electronically Signed   By: Evangeline Dakin M.D.   On: 04/27/2017 14:05   Meds ordered this encounter  Medications  . diclofenac (VOLTAREN) 75 MG EC tablet    Sig: Take 1 tablet (75 mg total) by mouth 2 (two) times daily.    Dispense:  14 tablet    Refill:  0   She will call her PCP if not seeing improvement within the next 2-3 days. May f/u here as needed.  Reviewed expectations re: course of current medical issues. Questions answered. Outlined signs and symptoms indicating need for more acute intervention. Patient verbalized understanding. After Visit Summary given.  SUBJECTIVE: History from: patient. [name] is a 67 y.o. female who reports persistent diffuse  mild to moderate pain of her left foot that is stable; described as aching and dull without radiation. Onset: gradual, about 3 days ago. Swelling present. Injury/trama: no. Relieved by: Tramadol with mild relief last evening. Worsened by: certain movements and weight bearing. Associated symptoms: none reported. Extremity sensation changes or weakness: none. History of similar: no  ROS: As per HPI.   OBJECTIVE:  Vitals:   04/27/17 1257  BP: 135/87  Resp: 16  Temp: 98.1 F (36.7 C)  TempSrc: Oral  SpO2: 99%    General appearance: alert; no distress Extremities: no cyanosis or edema; symmetrical with no gross deformities; diffuse tenderness over her left foot with mild swelling and no bruising; ROM: normal at L ankle CV: normal extremity capillary refill Skin: warm and dry Neurologic: normal gait; normal symmetric reflexes in all extremities; normal sensation in all extremities Psychological: alert and cooperative; normal mood and affect  Allergies  Allergen Reactions  . Morphine And Related Nausea And Vomiting  . Prochlorperazine     REACTION: nerve reaction  . Simvastatin     REACTION: leg cramps  . Sulfa Antibiotics     Past Medical History:  Diagnosis Date  . Anxiety   . CAD (coronary artery disease)   . Depression   . DISORDER, MENOPAUSAL NOS 03/20/2007   Qualifier: Diagnosis of  By: Arnoldo Morale MD, Balinda Quails   . Hypothyroidism   . MYOCARDIAL INFARCTION, HX OF 09/09/2007   Qualifier: Diagnosis of  By: Leanne Chang MD, Bruce    . Osteoporosis   . Polymyalgia rheumatica (HCC)    Social History   Socioeconomic History  .  Marital status: Married    Spouse name: Not on file  . Number of children: Not on file  . Years of education: Not on file  . Highest education level: Not on file  Social Needs  . Financial resource strain: Not on file  . Food insecurity - worry: Not on file  . Food insecurity - inability: Not on file  . Transportation needs - medical: Not on file  .  Transportation needs - non-medical: Not on file  Occupational History  . Not on file  Tobacco Use  . Smoking status: Never Smoker  . Smokeless tobacco: Never Used  Substance and Sexual Activity  . Alcohol use: No    Alcohol/week: 0.0 oz  . Drug use: No  . Sexual activity: Not on file  Other Topics Concern  . Not on file  Social History Narrative   Husband and 2 adult children live at home. No grandkids. Son with epilepsy.       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    Family History  Problem Relation Age of Onset  . Hypertension Father   . Heart disease Father        Mi age 55, 25, 69 and 75.  . Cancer Father   . Hyperlipidemia Father   . Colon cancer Maternal Uncle   . Hypertension Mother   . Cancer Mother   . Hyperlipidemia Mother    Past Surgical History:  Procedure Laterality Date  . CESAREAN SECTION    . CHOLECYSTECTOMY N/A 11/24/2012   Procedure: LAPAROSCOPIC CHOLECYSTECTOMY;  Surgeon: Ralene Ok, MD;  Location: [facility];  Service: General;  Laterality: N/A;  . COSMETIC SURGERY    . EYE SURGERY    . PTCA        Vanessa Kick, MD 04/27/17 1421

## 2017-05-02 ENCOUNTER — Telehealth: Payer: Self-pay | Admitting: Pharmacist

## 2017-05-02 NOTE — Telephone Encounter (Signed)
[facility] still waiting for proof of income.   I talked to Ms Smalling today and phone number was provided to contact [employer] and finish application process.

## 2017-05-28 ENCOUNTER — Telehealth: Payer: Self-pay | Admitting: Pharmacist

## 2017-05-28 NOTE — Telephone Encounter (Signed)
LMOM; patient to call back if additional help needed to complete [employer] patient assistance form for Repatha.

## 2017-06-04 ENCOUNTER — Telehealth: Payer: Self-pay

## 2017-06-04 DIAGNOSIS — Z1231 Encounter for screening mammogram for malignant neoplasm of breast: Secondary | ICD-10-CM | POA: Diagnosis not present

## 2017-06-04 DIAGNOSIS — M810 Age-related osteoporosis without current pathological fracture: Secondary | ICD-10-CM | POA: Diagnosis not present

## 2017-06-04 DIAGNOSIS — M8589 Other specified disorders of bone density and structure, multiple sites: Secondary | ICD-10-CM | POA: Diagnosis not present

## 2017-06-04 LAB — HM MAMMOGRAPHY

## 2017-06-04 LAB — HM DEXA SCAN

## 2017-06-04 NOTE — Telephone Encounter (Signed)
Great - thanks

## 2017-06-04 NOTE — Telephone Encounter (Signed)
Received a call from [facility] requesting a verbal order for A DEXA scan. Verbal order provided and I confirmed that they would fax over a copy of the report to our office

## 2017-06-06 ENCOUNTER — Encounter: Payer: Self-pay | Admitting: Family Medicine

## 2017-06-08 ENCOUNTER — Other Ambulatory Visit: Payer: Self-pay | Admitting: Family Medicine

## 2017-06-21 ENCOUNTER — Encounter: Payer: PPO | Admitting: Family Medicine

## 2017-07-08 DIAGNOSIS — R109 Unspecified abdominal pain: Secondary | ICD-10-CM | POA: Diagnosis not present

## 2017-07-08 DIAGNOSIS — R300 Dysuria: Secondary | ICD-10-CM | POA: Diagnosis not present

## 2017-07-08 DIAGNOSIS — Z79899 Other long term (current) drug therapy: Secondary | ICD-10-CM | POA: Diagnosis not present

## 2017-07-08 DIAGNOSIS — B9689 Other specified bacterial agents as the cause of diseases classified elsewhere: Secondary | ICD-10-CM | POA: Diagnosis not present

## 2017-07-08 DIAGNOSIS — J329 Chronic sinusitis, unspecified: Secondary | ICD-10-CM | POA: Diagnosis not present

## 2017-07-08 DIAGNOSIS — M5416 Radiculopathy, lumbar region: Secondary | ICD-10-CM | POA: Diagnosis not present

## 2017-07-09 ENCOUNTER — Telehealth: Payer: Self-pay | Admitting: Family Medicine

## 2017-07-09 NOTE — Telephone Encounter (Signed)
"  Caller states she has a sinus infection. Caller has facial pain, headache and congestion. Is there anything that the caller can take? It started on Friday. " Per [facility].   Triage completed.

## 2017-07-09 NOTE — Telephone Encounter (Signed)
Called and spoke to patient who states she went to Urgent Care yesterday and they treated her. I told her to let us know if she needed anything else

## 2017-07-16 DIAGNOSIS — S93492A Sprain of other ligament of left ankle, initial encounter: Secondary | ICD-10-CM | POA: Diagnosis not present

## 2017-07-29 ENCOUNTER — Ambulatory Visit (INDEPENDENT_AMBULATORY_CARE_PROVIDER_SITE_OTHER): Payer: PPO | Admitting: Family Medicine

## 2017-07-29 ENCOUNTER — Other Ambulatory Visit: Payer: Self-pay | Admitting: Family Medicine

## 2017-07-29 ENCOUNTER — Encounter: Payer: Self-pay | Admitting: Family Medicine

## 2017-07-29 VITALS — BP 128/86 | HR 80 | Temp 98.4°F | Ht 61.0 in | Wt 164.4 lb

## 2017-07-29 DIAGNOSIS — Z0000 Encounter for general adult medical examination without abnormal findings: Secondary | ICD-10-CM

## 2017-07-29 DIAGNOSIS — Z79899 Other long term (current) drug therapy: Secondary | ICD-10-CM | POA: Diagnosis not present

## 2017-07-29 DIAGNOSIS — E034 Atrophy of thyroid (acquired): Secondary | ICD-10-CM

## 2017-07-29 DIAGNOSIS — I2510 Atherosclerotic heart disease of native coronary artery without angina pectoris: Secondary | ICD-10-CM

## 2017-07-29 DIAGNOSIS — E785 Hyperlipidemia, unspecified: Secondary | ICD-10-CM

## 2017-07-29 DIAGNOSIS — K219 Gastro-esophageal reflux disease without esophagitis: Secondary | ICD-10-CM | POA: Diagnosis not present

## 2017-07-29 DIAGNOSIS — F3342 Major depressive disorder, recurrent, in full remission: Secondary | ICD-10-CM

## 2017-07-29 DIAGNOSIS — M353 Polymyalgia rheumatica: Secondary | ICD-10-CM | POA: Diagnosis not present

## 2017-07-29 LAB — LIPID PANEL
CHOL/HDL RATIO: 4
Cholesterol: 266 mg/dL — ABNORMAL HIGH (ref 0–200)
HDL: 70.4 mg/dL (ref >39.00)
LDL CALC: 179 mg/dL — AB (ref 0–99)
NonHDL: 195.86
TRIGLYCERIDES: 84 mg/dL (ref 0.0–149.0)
VLDL: 16.8 mg/dL (ref 0.0–40.0)

## 2017-07-29 LAB — COMPREHENSIVE METABOLIC PANEL
ALT: 13 U/L (ref 0–35)
AST: 14 U/L (ref 0–37)
Albumin: 4 g/dL (ref 3.5–5.2)
Alkaline Phosphatase: 86 U/L (ref 39–117)
BUN: 13 mg/dL (ref 6–23)
CALCIUM: 9.4 mg/dL (ref 8.4–10.5)
CHLORIDE: 105 meq/L (ref 96–112)
CO2: 28 meq/L (ref 19–32)
CREATININE: 0.88 mg/dL (ref 0.40–1.20)
GFR: 67.93 mL/min (ref >60.00)
Glucose, Bld: 84 mg/dL (ref 70–99)
Potassium: 3.5 mEq/L (ref 3.5–5.1)
Sodium: 142 mEq/L (ref 135–145)
Total Bilirubin: 0.6 mg/dL (ref 0.2–1.2)
Total Protein: 6.8 g/dL (ref 6.0–8.3)

## 2017-07-29 LAB — CBC
HCT: 40.5 % (ref 36.0–46.0)
Hemoglobin: 13.5 g/dL (ref 12.0–15.0)
MCHC: 33.3 g/dL (ref 30.0–36.0)
MCV: 90.2 fl (ref 78.0–100.0)
PLATELETS: 305 10*3/uL (ref 150.0–400.0)
RBC: 4.49 Mil/uL (ref 3.87–5.11)
RDW: 14.5 % (ref 11.5–15.5)
WBC: 8.3 10*3/uL (ref 4.0–10.5)

## 2017-07-29 LAB — TSH: TSH: 4.98 u[IU]/mL — ABNORMAL HIGH (ref 0.35–4.50)

## 2017-07-29 LAB — VITAMIN B12: Vitamin B-12: 270 pg/mL (ref 211–911)

## 2017-07-29 NOTE — Assessment & Plan Note (Signed)
PMR- patient has had poor follow up. She remains on prednisone 5 mg every other day. From avs "Cut prednisone to 5 mg on Monday, 2.5mg  on Wednesday, 5 mg on Friday - do this for 6 weeks. Then go to 5mg  Monday and Friday for 6 weeks then see me back in about 3-4 months. "

## 2017-07-29 NOTE — Progress Notes (Signed)
Your cholesterol is far too high- Team please see if you can get her into the lipid clinic with cardiology ASAP as she needs to restart repatha.   Your CBC was normal (blood counts, infection fighting cells, platelets). Your CMET was normal (kidney, liver, and electrolytes, blood sugar).  B12 is low normal- would encourage 1000 mcg of vitamin b12 daily by mouth Thyroid isslightly undertreated. Please adjust levothyroxine to 112 mcg (take 100 mcg dose off her chart) and help her set up a repeat tsh under hypothyroidism in 6 weeks.

## 2017-07-29 NOTE — Assessment & Plan Note (Signed)
Depression- phq9 of 1 on paxil- will continue current medicine

## 2017-07-29 NOTE — Assessment & Plan Note (Signed)
CAD- follows with cardiology and is on repatha as well as aspirin. She actually has been off repatha for 4 months and hasnt seen cardiology in a year. Strongly encouraged her to call asap

## 2017-07-29 NOTE — Progress Notes (Signed)
Phone: [phone]  Subjective:  Patient presents today for their annual physical. Chief complaint-noted.   See problem oriented charting- ROS- full  review of systems was completed and negative except for: dealing with stress and fatigue lately with caring for son with epilepsy- multiple follow up recently at [facility].   The following were reviewed and entered/updated in epic: Past Medical History:  Diagnosis Date  . Anxiety   . CAD (coronary artery disease)   . Depression   . DISORDER, MENOPAUSAL NOS 03/20/2007   Qualifier: Diagnosis of  By: Arnoldo Morale MD, Balinda Quails   . Hypothyroidism   . MYOCARDIAL INFARCTION, HX OF 09/09/2007   Qualifier: Diagnosis of  By: Leanne Chang MD, Bruce    . Osteoporosis   . Polymyalgia rheumatica [facility])    Patient Active Problem List   Diagnosis Date Noted  . Polymyalgia rheumatica ([place]) 12/01/2010    Priority: High  . CAD (coronary artery disease) 09/09/2007    Priority: High  . Osteoporosis 05/26/2014    Priority: Medium  . Depression 03/20/2007    Priority: Medium  . Hypothyroid 02/13/1999    Priority: Medium  . Polyp of colon, adenomatous 10/11/2014    Priority: Low  . Vitamin D deficiency 05/26/2014    Priority: Low  . Hematuria 09/09/2007    Priority: Low  . GERD (gastroesophageal reflux disease) 12/15/2015   Past Surgical History:  Procedure Laterality Date  . CESAREAN SECTION    . CHOLECYSTECTOMY N/A 11/24/2012   Procedure: LAPAROSCOPIC CHOLECYSTECTOMY;  Surgeon: Ralene Ok, MD;  Location: [facility];  Service: General;  Laterality: N/A;  . COSMETIC SURGERY    . EYE SURGERY    . PTCA      Family History  Problem Relation Age of Onset  . Hypertension Father   . Heart disease Father        Mi age 30, 41, 79 and 45.  . Cancer Father   . Hyperlipidemia Father   . Colon cancer Maternal Uncle   . Hypertension Mother   . Cancer Mother   . Hyperlipidemia Mother     Medications- reviewed and updated Current Outpatient Medications    Medication Sig Dispense Refill  . alendronate (FOSAMAX) 70 MG tablet TAKE 1 TABLET BY MOUTH EVERY 7 DAYS. TAKE ON EMPTY stomach WITH full GLASS OF WATER. stay upright. 12 tablet 2  . aspirin 81 MG EC tablet Take 81 mg by mouth daily.     Marland Kitchen levothyroxine (SYNTHROID, LEVOTHROID) 100 MCG tablet TAKE 1 TABLET BY MOUTH EVERY DAY 90 tablet 2  . pantoprazole (PROTONIX) 40 MG tablet TAKE 1 TABLET BY MOUTH EVERY DAY 90 tablet 2  . PARoxetine (PAXIL) 20 MG tablet TAKE 1 TABLET BY MOUTH EVERY MORNING 30 tablet 5  . predniSONE (DELTASONE) 5 MG tablet TAKE 1 TABLET BY MOUTH EVERY DAY monday through friday AND TAKE 1 & 1/2 tablet saturday AND sunday 90 tablet 0  . Evolocumab (REPATHA SURECLICK) 213 MG/ML SOAJ Inject 1 pen into the skin every 14 (fourteen) days. (Patient not taking: Reported on 07/29/2017) 2 pen 11   No current facility-administered medications for this visit.     Allergies-reviewed and updated Allergies  Allergen Reactions  . Morphine And Related Nausea And Vomiting  . Prochlorperazine     REACTION: nerve reaction  . Simvastatin     REACTION: leg cramps  . Sulfa Antibiotics     Social History   Social History Narrative   Husband and 2 adult children live at home. No  grandkids. Son with epilepsy.       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     Objective: BP 128/86 (BP Location: Left Arm, Patient Position: Sitting, Cuff Size: Large)   Pulse 80   Temp 98.4 F (36.9 C) (Oral)   Ht 5\' 1"  (1.549 m)   Wt 164 lb 6.4 oz (74.6 kg)   SpO2 96%   BMI 31.06 kg/m  Gen: NAD, resting comfortably HEENT: Mucous membranes are moist. Oropharynx normal Neck: no thyromegaly CV: RRR no murmurs rubs or gallops Lungs: CTAB no crackles, wheeze, rhonchi Abdomen: soft/nontender/nondistended/normal bowel sounds. No rebound or guarding.  Ext: no edema Skin: warm, dry Neuro: grossly normal, moves all extremities, PERRLA  Assessment/Plan:  67 y.o. female presenting for annual  physical.  Health Maintenance counseling: 1. Anticipatory guidance: Patient counseled regarding regular dental exams -q6 months, eye exams - yearly, wearing seatbelts.  2. Risk factor reduction:  Advised patient of need for regular exercise and diet rich and fruits and vegetables to reduce risk of heart attack and stroke. Exercise- has not been exercising well between caring for son and work- encouraged her to do so. Diet-weights up some- discussed reversing this- she admits to stress eating.  Wt Readings from Last 3 Encounters:  07/29/17 164 lb 6.4 oz (74.6 kg)  04/01/17 161 lb (73 kg)  08/31/16 160 lb 3.2 oz (72.7 kg)  3. Immunizations/screenings/ancillary studies- discussed shingrix at pharmacy. Had Tdap at work 2018 she reports Immunization History  Administered Date(s) Administered  . Influenza Whole 11/09/2008  . Influenza, High Dose Seasonal PF 10/30/2016  . Influenza-Unspecified 11/24/2015  . Pneumococcal Conjugate-13 10/11/2014  . Pneumococcal Polysaccharide-23 12/15/2015  . Td 02/12/2006  . Zoster 09/19/2010  4. Cervical cancer screening- denies history abnormal pap. Passed age based screening. Last 2012 5. Breast cancer screening-  breast exam today declined and mammogram 06/04/17 6. Colon cancer screening - 07/23/14 with 5 year follow up 7. Skin cancer screening- Dr. Allyson Sabal yearly. advised regular sunscreen use. Denies worrisome, changing, or new skin lesions.  8. Birth control/STD check- monogmous/postmenopausal 9. Osteoporosis screening at 8- DEXA 06/04/17 with worst t score -2.6 Left total femur. She is on fosamax and has been over 5 years- wants to continue.   Status of chronic or acute concerns   Depression Depression- phq9 of 1 on paxil- will continue current medicine  Polymyalgia rheumatica PMR- patient has had poor follow up. She remains on prednisone 5 mg every other day. From avs "Cut prednisone to 5 mg on Monday, 2.5mg  on Wednesday, 5 mg on Friday - do this for 6  weeks. Then go to 5mg  Monday and Friday for 6 weeks then see me back in about 3-4 months. "  GERD (gastroesophageal reflux disease) GERD- remains on protonix 40mg . Given long term use PPI will get b12 level. Has had to have esophagus dilated in past- given many things going on with son right now with epilepsy she doesn't want to make any adjustments in meds.   CAD (coronary artery disease) CAD- follows with cardiology and is on repatha as well as aspirin. She actually has been off repatha for 4 months and hasnt seen cardiology in a year. Strongly encouraged her to call asap  Hypothyroid Hypothyroidism- on levothyroxine 100 mcg   Return in about 3 months (around 10/29/2017).  Lab/Order associations: Preventative health care - Plan: CBC, Comprehensive metabolic panel, Lipid panel, TSH, Vitamin B12  Hypothyroidism due to acquired atrophy of thyroid - Plan: TSH  Hyperlipidemia, unspecified hyperlipidemia type - Plan: CBC, Comprehensive metabolic panel, Lipid panel  High risk medication use - Plan: Vitamin B12  Return precautions advised.  Garret Reddish, MD

## 2017-07-29 NOTE — Patient Instructions (Addendum)
Please check with your pharmacy to see if they have the shingrix vaccine. If they do- please get this immunization and update [place] by phone call or mychart with dates you receive the vaccine  Health Maintenance Due  Topic Date Due  . TETANUS/TDAP - Please get the date from work and let us know 02/13/2016   Please go ahead and call cardiology to get repatha set back up plus get in to see them.   Cut prednisone to 5 mg on Monday, 2.5mg  on Wednesday, 5 mg on Friday - do this for 6 weeks. Then go to 5mg  Monday and Friday for 6 weeks then see me back in about 3-4 months.   Please stop by lab before you go

## 2017-07-29 NOTE — Assessment & Plan Note (Signed)
Hypothyroidism- on levothyroxine 100 mcg

## 2017-07-29 NOTE — Assessment & Plan Note (Signed)
GERD- remains on protonix 40mg . Given long term use PPI will get b12 level. Has had to have esophagus dilated in past- given many things going on with son right now with epilepsy she doesn't want to make any adjustments in meds.

## 2017-07-30 ENCOUNTER — Other Ambulatory Visit: Payer: Self-pay

## 2017-07-30 DIAGNOSIS — E7800 Pure hypercholesterolemia, unspecified: Secondary | ICD-10-CM

## 2017-07-30 DIAGNOSIS — E0590 Thyrotoxicosis, unspecified without thyrotoxic crisis or storm: Secondary | ICD-10-CM

## 2017-07-30 MED ORDER — LEVOTHYROXINE SODIUM 112 MCG PO TABS: 112.0000 ug | ORAL_TABLET | Freq: Every day | ORAL | 3 refills | Status: DC

## 2017-08-02 ENCOUNTER — Telehealth: Payer: Self-pay | Admitting: Cardiology

## 2017-08-02 NOTE — Telephone Encounter (Signed)
Pt calling    Pt want to know how to get back started on her Repatha. Please advise pt.

## 2017-08-02 NOTE — Telephone Encounter (Signed)
[employer] still waiting for patient to call back and provide clarification for household income.   Phone number provided (again) She should qualify for free product.

## 2017-08-02 NOTE — Telephone Encounter (Signed)
Routed to CVRR 

## 2017-08-12 ENCOUNTER — Other Ambulatory Visit: Payer: Self-pay | Admitting: Family Medicine

## 2017-08-14 ENCOUNTER — Telehealth: Payer: Self-pay | Admitting: Cardiology

## 2017-08-14 NOTE — Telephone Encounter (Signed)
°*  STAT* If patient is at the pharmacy, call can be transferred to refill team.   1. Which medications need to be refilled? (please list name of each medication and dose if known) Repatha 140mg  per ML  2. Which pharmacy/location (including street and city if local pharmacy) is medication to be sent to?[phone] option 3  3. Do they need a 30 day or 90 day supply? [place]

## 2017-08-16 ENCOUNTER — Encounter: Payer: Self-pay | Admitting: Family Medicine

## 2017-08-16 NOTE — Telephone Encounter (Signed)
Not needed. Working with [employer].

## 2017-08-21 ENCOUNTER — Other Ambulatory Visit: Payer: Self-pay | Admitting: Pharmacist Clinician (PhC)/ Clinical Pharmacy Specialist

## 2017-08-21 ENCOUNTER — Telehealth: Payer: Self-pay | Admitting: Cardiology

## 2017-08-21 MED ORDER — EVOLOCUMAB 140 MG/ML ~~LOC~~ SOAJ: 1.0000 "pen " | SUBCUTANEOUS | 11 refills | Status: DC

## 2017-08-21 NOTE — Telephone Encounter (Signed)
°*  STAT* If patient is at the pharmacy, call can be transferred to refill team.   1. Which medications need to be refilled? (please list name of each medication and dose if known) needs a new prescription for Repatha  2. Which pharmacy/location (including street and city if local pharmacy) is medication to be sent to? [facility]- [phone] Option 3  3. Do they need a 30 day or 90 day supply? Injections

## 2017-09-02 ENCOUNTER — Telehealth: Payer: Self-pay | Admitting: Pharmacist

## 2017-09-02 NOTE — Telephone Encounter (Signed)
SafetyNet approved until 02/11/18. Patient to call safetynet to arrange delivery.   Will repeat Lipid panel in 6-8 weeks

## 2017-09-04 ENCOUNTER — Other Ambulatory Visit: Payer: PPO

## 2017-09-05 ENCOUNTER — Other Ambulatory Visit (INDEPENDENT_AMBULATORY_CARE_PROVIDER_SITE_OTHER): Payer: PPO

## 2017-09-05 DIAGNOSIS — E0590 Thyrotoxicosis, unspecified without thyrotoxic crisis or storm: Secondary | ICD-10-CM

## 2017-09-05 LAB — TSH: TSH: 0.25 u[IU]/mL — AB (ref 0.35–4.50)

## 2017-09-06 ENCOUNTER — Other Ambulatory Visit: Payer: PPO

## 2017-09-06 ENCOUNTER — Other Ambulatory Visit: Payer: Self-pay

## 2017-09-06 DIAGNOSIS — E034 Atrophy of thyroid (acquired): Secondary | ICD-10-CM

## 2017-09-30 ENCOUNTER — Encounter: Payer: Self-pay | Admitting: Family Medicine

## 2017-09-30 ENCOUNTER — Ambulatory Visit (INDEPENDENT_AMBULATORY_CARE_PROVIDER_SITE_OTHER): Payer: PPO | Admitting: Family Medicine

## 2017-09-30 ENCOUNTER — Telehealth: Payer: Self-pay | Admitting: Family Medicine

## 2017-09-30 VITALS — BP 126/80 | HR 64 | Temp 98.1°F | Ht 61.0 in | Wt 164.2 lb

## 2017-09-30 DIAGNOSIS — M353 Polymyalgia rheumatica: Secondary | ICD-10-CM | POA: Diagnosis not present

## 2017-09-30 MED ORDER — PREDNISONE 5 MG PO TABS: 5.0000 mg | ORAL_TABLET | Freq: Every day | ORAL | 0 refills | Status: DC

## 2017-09-30 MED ORDER — PREDNISONE 1 MG PO TABS: ORAL_TABLET | ORAL | 3 refills | Status: DC

## 2017-09-30 NOTE — Telephone Encounter (Signed)
Called and clarified with Dawn at the pharmacy that patient will follow the following schedule:  Take 10mg  for 4 weeks Then take 9 mg for 4 weeks (5mg  tablet plus four of the 1 mg tablets) Then take 8 mg for 4 weeks (5 mg tablet plus three of the 1 mg tablets) Then take 7 mg for 4 weeks (5 mg tablet plus two of the 1 mg tablets)

## 2017-09-30 NOTE — Progress Notes (Signed)
Subjective:  [name] is a 67 y.o. year old very pleasant female patient who presents for/with See problem oriented charting ROS- no fever, chills. Does have bilateral buttocks pain going down into both legs. Has some weakness in legs and overall feelings of fatigue.    Past Medical History-  Patient Active Problem List   Diagnosis Date Noted  . Polymyalgia rheumatica ([place]) 12/01/2010    Priority: High  . CAD (coronary artery disease) 09/09/2007    Priority: High  . Osteoporosis 05/26/2014    Priority: Medium  . Depression 03/20/2007    Priority: Medium  . Hypothyroid 02/13/1999    Priority: Medium  . Polyp of colon, adenomatous 10/11/2014    Priority: Low  . Vitamin D deficiency 05/26/2014    Priority: Low  . Hematuria 09/09/2007    Priority: Low  . GERD (gastroesophageal reflux disease) 12/15/2015    Medications- reviewed and updated Current Outpatient Medications  Medication Sig Dispense Refill  . alendronate (FOSAMAX) 70 MG tablet TAKE 1 TABLET BY MOUTH EVERY 7 DAYS. TAKE ON EMPTY stomach WITH full GLASS OF WATER. stay upright. 12 tablet 2  . aspirin 81 MG EC tablet Take 81 mg by mouth daily.     . Evolocumab (REPATHA SURECLICK) 825 MG/ML SOAJ Inject 1 pen into the skin every 14 (fourteen) days. 2 pen 11  . levothyroxine (SYNTHROID, LEVOTHROID) 112 MCG tablet Take 1 tablet (112 mcg total) by mouth daily. 90 tablet 3  . pantoprazole (PROTONIX) 40 MG tablet TAKE 1 TABLET BY MOUTH EVERY DAY 90 tablet 2  . PARoxetine (PAXIL) 20 MG tablet TAKE 1 TABLET BY MOUTH EVERY MORNING 30 tablet 5  . predniSONE (DELTASONE) 5 MG tablet Take 1-2 tablets (5-10 mg total) by mouth daily with breakfast. See taper 90 tablet 0  . predniSONE (DELTASONE) 1 MG tablet See taper instructions. To be used along with 5 mg 120 tablet 3   No current facility-administered medications for this visit.     Objective: BP 126/80 (BP Location: Left Arm, Patient Position: Sitting, Cuff Size: Large)    Pulse 64   Temp 98.1 F (36.7 C) (Oral)   Ht 5\' 1"  (1.549 m)   Wt 164 lb 4 oz (74.5 kg)   SpO2 98%   BMI 31.03 kg/m  Gen: NAD, resting comfortably CV: RRR  Lungs: nonlabored, normal respiratory rate Abdomen: soft/nondistended Ext: no edema Skin: warm, dry Pain with straight leg raise though no numbness/tingling. Reports pain in her back and shooting into legs. Pain with palpation of bilateral buttocks. No pain with palpation of lumbar spine.   Assessment/Plan:  Polymyalgia rheumatica S:  Patient with bilateral gluteal pain and leg pain- having trouble walking. Has had similar issues before with trying to wean prednisone  She was trying to come off prednisone and this seemed to flare up- starting 2-3 weeks ago. She was on 5 mg every other day. She is also feeling weakness. She went back up to 5 mg daily- some days will take 10mg  A/P:Flare of PMR most likely. We have had difficulty weaning prednisone. We discussed this could also be related to lumbar spine potentially with no upper extremity issues at present  We are going to try one more taper and if this doesn't work- get you into rheumatology. This is a very gradual taper but requires close follow up.   Take 10mg  for 4 weeks Then take 9 mg for 4 weeks (5mg  tablet plus four of the 1 mg tablets) Then take  8 mg for 4 weeks (5 mg tablet plus three of the 1 mg tablets) Then take 7 mg for 4 weeks (5 mg tablet plus two of the 1 mg tablets)  See me back in 3 months- can always take 1 step backwards if needed if flare up of pain.    Future Appointments  Date Time Provider [place]  10/16/2017  9:00 AM Armbruster, Carlota Raspberry, MD [facility]  11/07/2017  9:00 AM [facility]   Meds ordered this encounter  Medications  . predniSONE (DELTASONE) 5 MG tablet    Sig: Take 1-2 tablets (5-10 mg total) by mouth daily with breakfast. See taper    Dispense:  90 tablet    Refill:  0  . predniSONE (DELTASONE) 1 MG  tablet    Sig: See taper instructions. To be used along with 5 mg    Dispense:  120 tablet    Refill:  3   Return precautions advised.  Garret Reddish, MD

## 2017-09-30 NOTE — Telephone Encounter (Signed)
Copied from [place] [id number]. Topic: Quick Communication - See Telephone Encounter >> Sep 30, 2017  1:06 PM Gardiner Ramus wrote: CRM for notification. See Telephone encounter for: 09/30/17.predniSONE (DELTASONE) 1 MG tablet [[id number]]  [facility] and stated that she needs clarification on medication instructions. DE#[phone]

## 2017-09-30 NOTE — Telephone Encounter (Signed)
See note

## 2017-09-30 NOTE — Telephone Encounter (Signed)
[facility] calling back because the quantity does not match the script. She states pt needs 270/ 1 mg and 150 /of the 5 mg. Dawn would like a call back. [phone]

## 2017-09-30 NOTE — Telephone Encounter (Signed)
Yes thanks, may change quantity

## 2017-09-30 NOTE — Patient Instructions (Addendum)
Flare of PMR most likely. We have had difficulty weaning prednisone.   We are going to try one more taper and if this doesn't work- get you into rheumatology. This is a very gradual taper but requires close follow up.   Take 10mg  for 4 weeks Then take 9 mg for 4 weeks (5mg  tablet plus four of the 1 mg tablets) Then take 8 mg for 4 weeks (5 mg tablet plus three of the 1 mg tablets) Then take 7 mg for 4 weeks (5 mg tablet plus two of the 1 mg tablets)  See me back in 3 months- can always take 1 step backwards if needed if flare up of pain.

## 2017-09-30 NOTE — Assessment & Plan Note (Signed)
S:  Patient with bilateral gluteal pain and leg pain- having trouble walking. Has had similar issues before with trying to wean prednisone  She was trying to come off prednisone and this seemed to flare up- starting 2-3 weeks ago. She was on 5 mg every other day. She is also feeling weakness. She went back up to 5 mg daily- some days will take 10mg  A/P:Flare of PMR most likely. We have had difficulty weaning prednisone. We discussed this could also be related to lumbar spine potentially with no upper extremity issues at present  We are going to try one more taper and if this doesn't work- get you into rheumatology. This is a very gradual taper but requires close follow up.   Take 10mg  for 4 weeks Then take 9 mg for 4 weeks (5mg  tablet plus four of the 1 mg tablets) Then take 8 mg for 4 weeks (5 mg tablet plus three of the 1 mg tablets) Then take 7 mg for 4 weeks (5 mg tablet plus two of the 1 mg tablets)  See me back in 3 months- can always take 1 step backwards if needed if flare up of pain.

## 2017-10-01 NOTE — Telephone Encounter (Signed)
[facility] pharmacy and spoke to [place], told her okay to change quantity for Prednisone. Please dispense Prednisone 1 mg 270 tablets/ 5 mg 150 tablets okay per Dr. Yong Channel. Shana verbalized understanding and will change quantity.

## 2017-10-16 ENCOUNTER — Ambulatory Visit: Payer: PPO | Admitting: Gastroenterology

## 2017-10-22 ENCOUNTER — Other Ambulatory Visit: Payer: Self-pay | Admitting: Family Medicine

## 2017-10-31 ENCOUNTER — Other Ambulatory Visit: Payer: PPO

## 2017-11-04 ENCOUNTER — Ambulatory Visit: Payer: Self-pay

## 2017-11-04 ENCOUNTER — Ambulatory Visit (INDEPENDENT_AMBULATORY_CARE_PROVIDER_SITE_OTHER): Payer: PPO

## 2017-11-04 ENCOUNTER — Encounter: Payer: Self-pay | Admitting: Family Medicine

## 2017-11-04 ENCOUNTER — Other Ambulatory Visit: Payer: PPO

## 2017-11-04 ENCOUNTER — Ambulatory Visit: Payer: PPO | Admitting: Family Medicine

## 2017-11-04 ENCOUNTER — Other Ambulatory Visit (INDEPENDENT_AMBULATORY_CARE_PROVIDER_SITE_OTHER): Payer: PPO

## 2017-11-04 ENCOUNTER — Other Ambulatory Visit: Payer: Self-pay

## 2017-11-04 VITALS — BP 128/70 | HR 91 | Temp 97.7°F | Ht 61.0 in | Wt 166.6 lb

## 2017-11-04 DIAGNOSIS — M816 Localized osteoporosis [Lequesne]: Secondary | ICD-10-CM

## 2017-11-04 DIAGNOSIS — R1031 Right lower quadrant pain: Secondary | ICD-10-CM

## 2017-11-04 DIAGNOSIS — E034 Atrophy of thyroid (acquired): Secondary | ICD-10-CM

## 2017-11-04 DIAGNOSIS — M160 Bilateral primary osteoarthritis of hip: Secondary | ICD-10-CM | POA: Diagnosis not present

## 2017-11-04 MED ORDER — BACLOFEN 10 MG PO TABS: 10.0000 mg | ORAL_TABLET | Freq: Three times a day (TID) | ORAL | 1 refills | Status: DC | PRN

## 2017-11-04 NOTE — Progress Notes (Signed)
Subjective:  [name] is a 67 y.o. year old very pleasant female patient who presents for/with See problem oriented charting ROS- patient without bulging in groin or redness but feeling significant hip/groin pain. No falls. Trouble standing due pain.    Past Medical History-  Patient Active Problem List   Diagnosis Date Noted  . Polymyalgia rheumatica ([place]) 12/01/2010    Priority: High  . CAD (coronary artery disease) 09/09/2007    Priority: High  . Osteoporosis 05/26/2014    Priority: Medium  . Depression 03/20/2007    Priority: Medium  . Hypothyroid 02/13/1999    Priority: Medium  . Polyp of colon, adenomatous 10/11/2014    Priority: Low  . Vitamin D deficiency 05/26/2014    Priority: Low  . Hematuria 09/09/2007    Priority: Low  . GERD (gastroesophageal reflux disease) 12/15/2015   Medications- reviewed and updated Current Outpatient Medications  Medication Sig Dispense Refill  . alendronate (FOSAMAX) 70 MG tablet TAKE 1 TABLET BY MOUTH EVERY 7 DAYS. TAKE ON EMPTY stomach WITH full GLASS OF WATER. stay upright. 12 tablet 2  . aspirin 81 MG EC tablet Take 81 mg by mouth daily.     . Evolocumab (REPATHA SURECLICK) 601 MG/ML SOAJ Inject 1 pen into the skin every 14 (fourteen) days. 2 pen 11  . levothyroxine (SYNTHROID, LEVOTHROID) 112 MCG tablet Take 1 tablet (112 mcg total) by mouth daily. 90 tablet 3  . pantoprazole (PROTONIX) 40 MG tablet TAKE 1 TABLET BY MOUTH EVERY DAY 90 tablet 2  . PARoxetine (PAXIL) 20 MG tablet TAKE 1 TABLET BY MOUTH EVERY MORNING 30 tablet 5  . predniSONE (DELTASONE) 1 MG tablet See taper instructions. To be used along with 5 mg 120 tablet 3  . predniSONE (DELTASONE) 5 MG tablet Take 1-2 tablets (5-10 mg total) by mouth daily with breakfast. See taper 90 tablet 0  . baclofen (LIORESAL) 10 MG tablet Take 1 tablet (10 mg total) by mouth 3 (three) times daily as needed for muscle spasms. 60 tablet 1   No current facility-administered medications for  this visit.     Objective: BP 128/70 (BP Location: Left Arm, Patient Position: Sitting, Cuff Size: Large)   Pulse 91   Temp 97.7 F (36.5 C) (Oral)   Ht 5\' 1"  (1.549 m)   Wt 166 lb 9.6 oz (75.6 kg)   SpO2 96%   BMI 31.48 kg/m  Gen: NAD, resting comfortably CV: RRR  Lungs: nonlabored, normal respiratory rate Abdomen: soft/nondistended Ext: no edema Skin: warm, dry MSK: right hip- significant pain with hip abduction, rotation, flexion. Unable to hold leg up on her own due to pain. Left hip normal  Assessment/Plan:  Right groin pain - Plan: DG HIP UNILAT WITH PELVIS 2-3 VIEWS RIGHT, [place] MSK POCT ULTRASOUND Localized osteoporosis without current pathological fracture Primary osteoarthritis of both hips - Plan: [place] MSK POCT ULTRASOUND S:  Saturday- trying to squeeze trimmers between leg- noted severe pain in right groin- constant but worse with walking. Unable to walk this morning- slightly better and can walk but with significant pain. Has seen orthopedics in past and had injections in hip and found helpful- doesn't recall pain being this severe.  A/P: Patient with severe right groin pain possibly related to significant OA. Dr. Paulla Fore was kind enough to come in the room to evaluate and when he saw her level of pain- agreed to injection today. We did discuss possible chance of AVN which would lead to hip replacement but  given her OA may need that eventually anyway. Dr. Paulla Fore discussed 2 month follow up with him- and gave her some baclofen as well   Hypothyroid S: On thyroid medication-levothyroxine 112 mcg  Lab Results  Component Value Date   TSH 0.70 11/04/2017   A/P: updated tsh today is well controlled   Return in about 2 weeks (around 11/18/2017) for with Dr. Paulla Fore.  Lab/Order associations: Right groin pain - Plan: DG HIP UNILAT WITH PELVIS 2-3 VIEWS RIGHT, [place] MSK POCT ULTRASOUND  Hypothyroidism due to acquired atrophy of thyroid - Plan: TSH, [place] MSK POCT ULTRASOUND  Localized  osteoporosis without current pathological fracture  Primary osteoarthritis of both hips - Plan: [place] MSK POCT ULTRASOUND  Meds ordered this encounter  Medications  . baclofen (LIORESAL) 10 MG tablet    Sig: Take 1 tablet (10 mg total) by mouth 3 (three) times daily as needed for muscle spasms.    Dispense:  60 tablet    Refill:  1   Return precautions advised.  Garret Reddish, MD

## 2017-11-04 NOTE — Patient Instructions (Signed)

## 2017-11-04 NOTE — Progress Notes (Signed)
PROCEDURE NOTE:  Ultrasound Guided: Injection: Right hip Images were obtained and interpreted by myself, Teresa Coombs, DO  Images have been saved and stored to PACS system. Images obtained on: GE S7 Ultrasound machine    ULTRASOUND FINDINGS:  Degenerative change on the x-rays today.  No evidence of significant collapse.  She does have some implication with a soft tissue  DESCRIPTION OF PROCEDURE:  The patient's clinical condition is marked by substantial pain and/or significant functional disability. Other conservative therapy has not provided relief, is contraindicated, or not appropriate. There is a reasonable likelihood that injection will significantly improve the patient's pain and/or functional impairment.   After discussing the risks, benefits and expected outcomes of the injection and all questions were reviewed and answered, the patient wished to undergo the above named procedure.  Verbal consent was obtained.  The ultrasound was used to identify the target structure and adjacent neurovascular structures. The skin was then prepped in sterile fashion and the target structure was injected under direct visualization using sterile technique as below:  Single injection performed as below: PREP: Alcohol and Ethel Chloride APPROACH:direct, stopcock technique, 22g 3.5 in. INJECTATE: 5 cc 1% lidocaine, 2 cc 0.5% Marcaine and 2 cc 40mg /mL DepoMedrol ASPIRATE: None DRESSING: Band-Aid  Post procedural instructions including recommending icing and warning signs for infection were reviewed.    This procedure was well tolerated and there were no complications.   IMPRESSION: Succesful Ultrasound Guided: Injection

## 2017-11-05 LAB — TSH: TSH: 0.7 u[IU]/mL (ref 0.35–4.50)

## 2017-11-06 NOTE — Assessment & Plan Note (Signed)
S: On thyroid medication-levothyroxine 112 mcg  Lab Results  Component Value Date   TSH 0.70 11/04/2017   A/P: updated tsh today is well controlled

## 2017-11-07 ENCOUNTER — Other Ambulatory Visit: Payer: PPO

## 2017-11-15 ENCOUNTER — Telehealth: Payer: Self-pay | Admitting: *Deleted

## 2017-11-15 NOTE — Telephone Encounter (Signed)
Referral Request Placed in Scheduling Box. There were no notes attached.  [facility] [place]  P# [phone] F# [phone]

## 2017-12-02 ENCOUNTER — Encounter: Payer: Self-pay | Admitting: Family Medicine

## 2017-12-02 ENCOUNTER — Ambulatory Visit: Payer: PPO | Admitting: Family Medicine

## 2017-12-02 VITALS — BP 128/78 | HR 91 | Temp 98.1°F | Ht 61.0 in | Wt 170.2 lb

## 2017-12-02 DIAGNOSIS — Z23 Encounter for immunization: Secondary | ICD-10-CM

## 2017-12-02 DIAGNOSIS — M1611 Unilateral primary osteoarthritis, right hip: Secondary | ICD-10-CM

## 2017-12-02 DIAGNOSIS — M353 Polymyalgia rheumatica: Secondary | ICD-10-CM

## 2017-12-02 DIAGNOSIS — E034 Atrophy of thyroid (acquired): Secondary | ICD-10-CM

## 2017-12-02 NOTE — Patient Instructions (Addendum)
Flu shot today  When pain starts to creep up again lets say in 3-4/10 range I want you to schedule pronto with Dr. Paulla Fore for follow up (sports medicine in our building). I am going to put a referral in so its available when needed.   Continue to work through your taper- why dont we plan on seeing each other when things slow down at your clinic- perhaps march. Happy to see you sooner if needed

## 2017-12-02 NOTE — Assessment & Plan Note (Addendum)
S:Down to 8 mg a day of prednisone slowly trying to work down to 5 mg or lower. She is tolerating taper reasonably well without flair up in weakness/pain in hips/legs/shoulders.  A/P: stable- without recurrence. Continue prednisone taper From prior avs "Take 10mg  for 4 weeks Then take 9 mg for 4 weeks (5mg  tablet plus four of the 1 mg tablets) Then take 8 mg for 4 weeks (5 mg tablet plus three of the 1 mg tablets) Then take 7 mg for 4 weeks (5 mg tablet plus two of the 1 mg tablets) " She is aware she can take step back up if needed. She plans to see me back in spring and I am hoping under 5 mg at that time. Doing more prolonged taper to help avoid recurrence. She is aware getting off is one of the best things we can do for her osteoporosis

## 2017-12-02 NOTE — Assessment & Plan Note (Signed)
S: On thyroid medication-levothyroxine 112 mcg  Lab Results  Component Value Date   TSH 0.70 11/04/2017  A/P: doing well- continue current rx

## 2017-12-02 NOTE — Progress Notes (Signed)
Subjective:  [name] is a 67 y.o. year old very pleasant female patient who presents for/with See problem oriented charting ROS- hip pain much improved. No fever, chills. No chest pain. No shortness of breath reported    Past Medical History-  Patient Active Problem List   Diagnosis Date Noted  . Polymyalgia rheumatica ([place]) 12/01/2010    Priority: High  . CAD (coronary artery disease) 09/09/2007    Priority: High  . Osteoarthritis of right hip 12/02/2017    Priority: Medium  . Osteoporosis 05/26/2014    Priority: Medium  . Depression 03/20/2007    Priority: Medium  . Hypothyroid 02/13/1999    Priority: Medium  . Polyp of colon, adenomatous 10/11/2014    Priority: Low  . Vitamin D deficiency 05/26/2014    Priority: Low  . Hematuria 09/09/2007    Priority: Low  . GERD (gastroesophageal reflux disease) 12/15/2015    Medications- reviewed and updated Current Outpatient Medications  Medication Sig Dispense Refill  . alendronate (FOSAMAX) 70 MG tablet TAKE 1 TABLET BY MOUTH EVERY 7 DAYS. TAKE ON EMPTY stomach WITH full GLASS OF WATER. stay upright. 12 tablet 2  . aspirin 81 MG EC tablet Take 81 mg by mouth daily.     . Evolocumab (REPATHA SURECLICK) 413 MG/ML SOAJ Inject 1 pen into the skin every 14 (fourteen) days. 2 pen 11  . levothyroxine (SYNTHROID, LEVOTHROID) 112 MCG tablet Take 1 tablet (112 mcg total) by mouth daily. 90 tablet 3  . pantoprazole (PROTONIX) 40 MG tablet TAKE 1 TABLET BY MOUTH EVERY DAY 90 tablet 2  . PARoxetine (PAXIL) 20 MG tablet TAKE 1 TABLET BY MOUTH EVERY MORNING 30 tablet 5  . predniSONE (DELTASONE) 1 MG tablet See taper instructions. To be used along with 5 mg 120 tablet 3  . predniSONE (DELTASONE) 5 MG tablet Take 1-2 tablets (5-10 mg total) by mouth daily with breakfast. See taper 90 tablet 0   No current facility-administered medications for this visit.     Objective: BP 128/78 (BP Location: Left Arm, Patient Position: Sitting, Cuff Size:  Large)   Pulse 91   Temp 98.1 F (36.7 C) (Oral)   Ht 5\' 1"  (1.549 m)   Wt 170 lb 3.2 oz (77.2 kg)   SpO2 96%   BMI 32.16 kg/m  Gen: NAD, resting comfortably CV: RRR no murmurs rubs or gallops Lungs: CTAB no crackles, wheeze, rhonchi Ext: no edema Skin: warm, dry Neuro: grossly normal, moves all extremities Improved ROM of hip without pain, slight limp with walking  Assessment/Plan:  Osteoarthritis of right hip S: right hip pain responded well to injection about a month ago. Pain was 10/10 before with walking- now down to 2/10. She is on low dose chronic prednisone for PMR she is trying to be weaned off of.  A/P: Severe OA with reasonable control at present.  thrilled with improvement in pain. Encouraged her to be proactive if pain is worsening and get back in with Dr. Paulla Fore for his opinion   Polymyalgia rheumatica S:Down to 8 mg a day of prednisone slowly trying to work down to 5 mg or lower. She is tolerating taper reasonably well without flair up in weakness/pain in hips/legs/shoulders.  A/P: stable- without recurrence. Continue prednisone taper From prior avs "Take 10mg  for 4 weeks Then take 9 mg for 4 weeks (5mg  tablet plus four of the 1 mg tablets) Then take 8 mg for 4 weeks (5 mg tablet plus three of the 1 mg  tablets) Then take 7 mg for 4 weeks (5 mg tablet plus two of the 1 mg tablets) " She is aware she can take step back up if needed. She plans to see me back in spring and I am hoping under 5 mg at that time. Doing more prolonged taper to help avoid recurrence. She is aware getting off is one of the best things we can do for her osteoporosis  Hypothyroid S: On thyroid medication-levothyroxine 112 mcg  Lab Results  Component Value Date   TSH 0.70 11/04/2017  A/P: doing well- continue current rx  Lab/Order associations: Need for prophylactic vaccination and inoculation against influenza - Plan: Flu vaccine HIGH DOSE PF  Primary osteoarthritis of right hip - Plan:  Ambulatory referral to Sports Medicine  Polymyalgia rheumatica ([place])  Hypothyroidism due to acquired atrophy of thyroid  Return precautions advised.  Garret Reddish, MD

## 2017-12-02 NOTE — Assessment & Plan Note (Addendum)
S: right hip pain responded well to injection about a month ago. Pain was 10/10 before with walking- now down to 2/10. She is on low dose chronic prednisone for PMR she is trying to be weaned off of.  A/P: Severe OA with reasonable control at present.  thrilled with improvement in pain. Encouraged her to be proactive if pain is worsening and get back in with Dr. Paulla Fore for his opinion

## 2017-12-11 ENCOUNTER — Ambulatory Visit: Payer: PPO | Admitting: Sports Medicine

## 2017-12-11 ENCOUNTER — Encounter: Payer: Self-pay | Admitting: Sports Medicine

## 2017-12-11 VITALS — BP 138/84 | HR 69 | Ht 61.0 in | Wt 169.6 lb

## 2017-12-11 DIAGNOSIS — M1611 Unilateral primary osteoarthritis, right hip: Secondary | ICD-10-CM

## 2017-12-11 DIAGNOSIS — M353 Polymyalgia rheumatica: Secondary | ICD-10-CM | POA: Diagnosis not present

## 2017-12-11 NOTE — Progress Notes (Signed)
[name]. [name], [place] [facility] [place]  [name] - 67 y.o. female MRN [id number]  Date of birth: [birth year]  Visit Date: 12/11/2017  PCP: [name], MD   Referred by: [name], MD   Scribe(s) for today's visit: Josepha Pigg, CMA  SUBJECTIVE:  [name] is here for Initial Assessment (R hip pain)  Referred by: Dr. Garret Reddish  HPI: Her R hip pain symptoms INITIALLY: Began several years ago and MOI is unknown. She denies past injury to the hip or back.  Described as moderate burning and aching, radiating to the R thigh.  Worsened with prolonged periods of standing, bending, sitting to standing, sitting for prolonged period of time.  Improved with lying down, stretching legs out.  Additional associated symptoms include: She denies groin pain but reports some LBP. She denies stiffness, catching, clicking, popping. Pain is mostly lateral.      At this time symptoms are improving compared to onset, good relief with steroid hip injection.  She has been taking IBU with some relief. She has tried Tylenol in the past with no relief. She was prescribed Baclofen and reports some relief with this. She has tried applying heat with some relief.  She received steroid injection 11/04/2017 and tolerated well.   REVIEW OF SYSTEMS: Reports night time disturbances. Denies fevers, chills, or night sweats. Denies unexplained weight loss. Denies personal history of cancer. Denies changes in bowel or bladder habits. Denies recent unreported falls. Denies new or worsening dyspnea or wheezing. Denies headaches or dizziness.  Denies numbness, tingling or weakness  In the extremities.  Denies dizziness or presyncopal episodes Denies lower extremity edema    HISTORY:  Prior history reviewed and updated per electronic medical record.  Social History   Occupational History  . Not on file  Tobacco Use  .  Smoking status: Never Smoker  . Smokeless tobacco: Never Used  Substance and Sexual Activity  . Alcohol use: No    Alcohol/week: 0.0 standard drinks  . Drug use: No  . Sexual activity: Not on file   Social History   Social History Narrative   Husband and 2 adult children live at home. No grandkids. Son with epilepsy.       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     DATA OBTAINED & REVIEWED:  No results for input(s): HGBA1C, LABURIC, CREATINE in the last 8760 hours. Problem  Osteoarthritis of Right Hip   XR R hip 11/04/2017: IMPRESSION: Degenerative change lumbar spine and both hips. No acute abnormality.    .   OBJECTIVE:  VS:  HT:5\' 1"  (154.9 cm)   WT:169 lb 9.6 oz (76.9 kg)  BMI:32.06    BP:138/84  HR:69bpm  TEMP: ( )  RESP:95 %   PHYSICAL EXAM: CONSTITUTIONAL: Well-developed, Well-nourished and In no acute distress PSYCHIATRIC: Alert & appropriately interactive. and Not depressed or anxious appearing. RESPIRATORY: No increased work of breathing and Trachea Midline EYES: Pupils are equal., EOM intact without nystagmus. and No scleral icterus.  VASCULAR EXAM: Warm and well perfused NEURO: unremarkable  MSK Exam: Right hip  Well aligned, no significant deformity. No overlying skin changes. No focal bony tenderness   RANGE OF MOTION & STRENGTH  improved IR and ER with minimal pain. Normal stinchfield   SPECIALITY TESTING:  minmal TTP over lateral hip  negative straight leg raise     ASSESSMENT   1. Polymyalgia  rheumatica ([place])   2. Primary osteoarthritis of right hip     PLAN:  Pertinent additional documentation may be included in corresponding procedure notes, imaging studies, problem based documentation and patient instructions.  Procedures:  . None  Medications:  No orders of the defined types were placed in this encounter.  Discussion/Instructions: Osteoarthritis of right hip Low probability of AVN however if progressive  worsening over the next week will need to set up for MRI of the hip. She will call if any progressively worsening  Otherwise will see back in 6 weeks for repeat injection.  ROM improving  .   Marland Kitchen Discussed red flag symptoms that warrant earlier emergent evaluation and patient voices understanding. . Activity modifications and the importance of avoiding exacerbating activities (limiting pain to no more than a 4 / 10 during or following activity) recommended and discussed.  Follow-up:  . Return in about 6 weeks (around 01/22/2018) for consideration of repeat injections.   . If any lack of improvement consider: further diagnostic evaluation with MRI right hip  . At follow up will plan to consider: repeat corticosteroid injections     CMA/ATC served as scribe during this visit. History, Physical, and Plan performed by medical provider. Documentation and orders reviewed and attested to.      Gerda Diss, [facility] Physician

## 2017-12-11 NOTE — Assessment & Plan Note (Deleted)
XR R hip 11/04/2017: IMPRESSION: Degenerative change lumbar spine and both hips. No acute abnormality.

## 2017-12-11 NOTE — Assessment & Plan Note (Signed)
Low probability of AVN however if progressive worsening over the next week will need to set up for MRI of the hip. She will call if any progressively worsening  Otherwise will see back in 6 weeks for repeat injection.  ROM improving

## 2017-12-16 ENCOUNTER — Telehealth: Payer: Self-pay | Admitting: Pharmacist

## 2017-12-16 DIAGNOSIS — E785 Hyperlipidemia, unspecified: Secondary | ICD-10-CM

## 2017-12-16 NOTE — Telephone Encounter (Signed)
New form for [employer] SafetyNet 2020 mailed to patient. Need to return complete form to office ASAP and complete lipid profile.

## 2017-12-21 ENCOUNTER — Other Ambulatory Visit: Payer: Self-pay | Admitting: Family Medicine

## 2017-12-23 NOTE — Telephone Encounter (Signed)
Per OV note 12/02/2017:  Polymyalgia rheumatica S:Down to 8 mg a day of prednisone slowly trying to work down to 5 mg or lower. She is tolerating taper reasonably well without flair up in weakness/pain in hips/legs/shoulders.  A/P: stable- without recurrence. Continue prednisone taper From prior avs "Take 10mg  for 4 weeks Then take 9 mg for 4 weeks (5mg  tablet plus four of the 1 mg tablets) Then take 8 mg for 4 weeks (5 mg tablet plus three of the 1 mg tablets) Then take 7 mg for 4 weeks (5 mg tablet plus two of the 1 mg tablets) " She is aware she can take step back up if needed. She plans to see me back in spring and I am hoping under 5 mg at that time. Doing more prolonged taper to help avoid recurrence. She is aware getting off is one of the best things we can do for her osteoporosis

## 2017-12-24 DIAGNOSIS — E785 Hyperlipidemia, unspecified: Secondary | ICD-10-CM | POA: Diagnosis not present

## 2017-12-24 LAB — LIPID PANEL
CHOL/HDL RATIO: 2.6 ratio (ref 0.0–4.4)
Cholesterol, Total: 194 mg/dL (ref 100–199)
HDL: 74 mg/dL (ref >39)
LDL Calculated: 106 mg/dL — ABNORMAL HIGH (ref 0–99)
TRIGLYCERIDES: 70 mg/dL (ref 0–149)
VLDL Cholesterol Cal: 14 mg/dL (ref 5–40)

## 2017-12-31 ENCOUNTER — Other Ambulatory Visit: Payer: Self-pay | Admitting: Family Medicine

## 2018-01-27 ENCOUNTER — Telehealth (INDEPENDENT_AMBULATORY_CARE_PROVIDER_SITE_OTHER): Payer: Self-pay | Admitting: *Deleted

## 2018-01-27 ENCOUNTER — Encounter (INDEPENDENT_AMBULATORY_CARE_PROVIDER_SITE_OTHER): Payer: Self-pay

## 2018-01-27 ENCOUNTER — Ambulatory Visit (INDEPENDENT_AMBULATORY_CARE_PROVIDER_SITE_OTHER): Payer: PPO | Admitting: Family Medicine

## 2018-01-27 ENCOUNTER — Encounter (INDEPENDENT_AMBULATORY_CARE_PROVIDER_SITE_OTHER): Payer: Self-pay | Admitting: Family Medicine

## 2018-01-27 DIAGNOSIS — M25511 Pain in right shoulder: Secondary | ICD-10-CM | POA: Diagnosis not present

## 2018-01-27 MED ORDER — LIDOCAINE HCL (PF) 1 % IJ SOLN
5.0000 mL | INTRAMUSCULAR | Status: AC | PRN
  Administered 2018-01-27: 5 mL

## 2018-01-27 MED ORDER — PREDNISONE 10 MG PO TABS: ORAL_TABLET | ORAL | 0 refills | Status: DC

## 2018-01-27 MED ORDER — METHYLPREDNISOLONE ACETATE 40 MG/ML IJ SUSP
40.0000 mg | INTRAMUSCULAR | Status: AC | PRN
  Administered 2018-01-27: 40 mg via INTRA_ARTICULAR

## 2018-01-27 NOTE — Progress Notes (Signed)
Office Visit Note   Patient: [name]           Date of Birth: [birth year]           MRN: [id number] Visit Date: 01/27/2018 Requested by: [name], MD [place], [place] [address] PCP: [name], MD  Subjective: Chief Complaint  Patient presents with  . Right Shoulder - Pain    Pain in the right shoulder, with decreased ability to raise the arm.  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  Pain started that night.    HPI: She is a 67 year old right-hand-dominant female with right shoulder pain.  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  She did not have any pain during her 5-hour shift, but later that night she started feeling severe pain in her lateral shoulder.  Now she can hardly move it.  Using a muscle relaxant with minimal improvement.  She took extra prednisone last night with no improvement.  She has a history of polymyalgia rheumatica and is on prednisone 8 mg daily chronically.  Symptoms are mainly in her hip.  Her right shoulder was x-rayed a couple years ago after doing a lot of yard work.  X-rays showed calcific tendinopathy changes.  She had pain for a few days but then has been completely pain-free since then.  She works at [employer].              ROS: Otherwise noncontributory  Objective: Vital Signs: There were no vitals taken for this visit.  Physical Exam:  Right shoulder: Limited active range of motion.  Tender in the posterior and lateral subacromial space.  Isometric internal and external rotation strength are normal.  Unable to test supraspinatus.  Full range of motion of the elbow pain-free.  No tenderness at the [facility] joint.  Imaging: None today.  Assessment & Plan: 1.  Right shoulder pain, suspect impingement related to calcific tendinopathy -Discussed options with patient, she wants to try a subacromial injection.  If no improvement, then x-rays.    Follow-Up Instructions: Return if  symptoms worsen or fail to improve.      Procedures: Large Joint Inj on 01/27/2018 1:27 PM Indications: pain Details: 25 G 1.5 in needle, posterior approach  Arthrogram: No  Medications: 5 mL lidocaine (PF) 1 %; 40 mg methylPREDNISolone acetate 40 MG/ML Consent was given by the patient.      No notes on file    PMFS History: Patient Active Problem List   Diagnosis Date Noted  . Osteoarthritis of right hip 12/02/2017  . GERD (gastroesophageal reflux disease) 12/15/2015  . Polyp of colon, adenomatous 10/11/2014  . Vitamin D deficiency 05/26/2014  . Osteoporosis 05/26/2014  . Polymyalgia rheumatica ([place]) 12/01/2010  . CAD (coronary artery disease) 09/09/2007  . Hematuria 09/09/2007  . Depression 03/20/2007  . Hypothyroid 02/13/1999   Past Medical History:  Diagnosis Date  . Anxiety   . CAD (coronary artery disease)   . Depression   . DISORDER, MENOPAUSAL NOS 03/20/2007   Qualifier: Diagnosis of  By: Arnoldo Morale MD, Balinda Quails   . Hypothyroidism   . MYOCARDIAL INFARCTION, HX OF 09/09/2007   Qualifier: Diagnosis of  By: Leanne Chang MD, Bruce    . Osteoporosis   . Polymyalgia rheumatica ([facility])     Family History  Problem Relation Age of Onset  . Hypertension Father   . Heart disease Father  Mi age 76, 65, 64 and 47.  . Cancer Father   . Hyperlipidemia Father   . Colon cancer Maternal Uncle   . Hypertension Mother   . Cancer Mother   . Hyperlipidemia Mother     Past Surgical History:  Procedure Laterality Date  . CESAREAN SECTION    . CHOLECYSTECTOMY N/A 11/24/2012   Procedure: LAPAROSCOPIC CHOLECYSTECTOMY;  Surgeon: Ralene Ok, MD;  Location: [facility];  Service: General;  Laterality: N/A;  . COSMETIC SURGERY    . EYE SURGERY    . PTCA     Social History   Occupational History  . Not on file  Tobacco Use  . Smoking status: Never Smoker  . Smokeless tobacco: Never Used  Substance and Sexual Activity  . Alcohol use: No    Alcohol/week: 0.0 standard drinks    . Drug use: No  . Sexual activity: Not on file

## 2018-01-27 NOTE — Telephone Encounter (Signed)
Pt called states needs note to go back to work tomorrow, pt will come to pick up.  CB [phone]

## 2018-01-27 NOTE — Telephone Encounter (Signed)
I called and advised patient note is up front and ready for pickup.

## 2018-01-28 ENCOUNTER — Telehealth (INDEPENDENT_AMBULATORY_CARE_PROVIDER_SITE_OTHER): Payer: Self-pay | Admitting: Family Medicine

## 2018-01-28 ENCOUNTER — Ambulatory Visit (INDEPENDENT_AMBULATORY_CARE_PROVIDER_SITE_OTHER): Payer: Self-pay | Admitting: Family Medicine

## 2018-01-28 NOTE — Telephone Encounter (Signed)
Patient called asked if the note can be faxed over to her employer. Patient said she could not pick it up yesterday. Patient said her employer is asking for the note. The fax # is [phone]   The number to contact patient is [phone]

## 2018-01-28 NOTE — Telephone Encounter (Signed)
Done    Patient advised

## 2018-03-03 ENCOUNTER — Ambulatory Visit: Payer: PPO | Admitting: Physician Assistant

## 2018-03-03 ENCOUNTER — Encounter: Payer: Self-pay | Admitting: Gastroenterology

## 2018-03-03 ENCOUNTER — Encounter: Payer: Self-pay | Admitting: Physician Assistant

## 2018-03-03 VITALS — BP 124/80 | HR 72 | Ht 61.5 in | Wt 169.0 lb

## 2018-03-03 DIAGNOSIS — K219 Gastro-esophageal reflux disease without esophagitis: Secondary | ICD-10-CM | POA: Diagnosis not present

## 2018-03-03 DIAGNOSIS — K222 Esophageal obstruction: Secondary | ICD-10-CM

## 2018-03-03 DIAGNOSIS — R1310 Dysphagia, unspecified: Secondary | ICD-10-CM | POA: Diagnosis not present

## 2018-03-03 MED ORDER — PANTOPRAZOLE SODIUM 40 MG PO TBEC: 40.0000 mg | DELAYED_RELEASE_TABLET | Freq: Every day | ORAL | 3 refills | Status: DC

## 2018-03-03 NOTE — Patient Instructions (Addendum)
Continue Pantoprazole sodium 40 mg daily. You have been scheduled for an endoscopy. Please follow written instructions given to you at your visit today. If you use inhalers (even only as needed), please bring them with you on the day of your procedure. Your physician has requested that you go to www.startemmi.com and enter the access code given to you at your visit today. This web site gives a general overview about your procedure. However, you should still follow specific instructions given to you by our office regarding your preparation for the procedure. Normal BMI (Body Mass Index- based on height and weight) is between 23 and 30. Your BMI today is Body mass index is 31.42 kg/m. Marland Kitchen Please consider follow up  regarding your BMI with your Primary Care Provider.

## 2018-03-03 NOTE — Progress Notes (Signed)
Subjective:    Patient ID: [name], female    DOB: 1950/11/15, 68 y.o.   MRN: [id number]  HPI [name] is a pleasant 68 year old white female, known to Dr. Havery Moros, who comes in today with complaints of recurrent solid food dysphasia. Patient was last seen in 2017, and underwent EGD in March 2017 for dysphasia and was found to have a Schatzki's ring at the GE junction which was dilated from 18 to 19 mm.  Exam was otherwise unremarkable. She had colonoscopy in 2016 per Dr. Olevia Perches with removal of 1 7 mm polyp from the sigmoid colon which was a tubular adenoma.  Patient says the last dilation helped her significantly for about 69months and says that symptoms gradually have recurred since then.  She has had particularly severe symptoms over the past 4 to 6 weeks.  She does not have any difficulty with liquids alone.  At this point she is having difficulty with solid food with just about every meal.  She has a sensation that food is sitting in her esophagus, and has episodes that are uncomfortable when food actually becomes lodged.  She is having very frequent regurgitation.  She denies any heartburn or indigestion, no abdominal pain, no nausea. She has lost between 9 and 11 pounds over the past 4 to 6 weeks due to the dysphagia.  She has been on chronic Protonix 40 mg p.o. daily and says she has been taking that regularly. No regular NSAIDs.  No blood thinners other than baby aspirin 81 mg daily.  Review of Systems Pertinent positive and negative review of systems were noted in the above HPI section.  All other review of systems was otherwise negative.  Outpatient Encounter Medications as of 03/03/2018  Medication Sig  . alendronate (FOSAMAX) 70 MG tablet TAKE 1 TABLET BY MOUTH EVERY 7 DAYS. TAKE ON EMPTY stomach WITH full GLASS OF WATER. stay upright.  Marland Kitchen aspirin 81 MG EC tablet Take 81 mg by mouth daily.   . Evolocumab (REPATHA SURECLICK) 856 MG/ML SOAJ Inject 1 pen into the skin every 14  (fourteen) days.  Marland Kitchen levothyroxine (SYNTHROID, LEVOTHROID) 112 MCG tablet Take 1 tablet (112 mcg total) by mouth daily.  . pantoprazole (PROTONIX) 40 MG tablet Take 1 tablet (40 mg total) by mouth daily.  Marland Kitchen PARoxetine (PAXIL) 20 MG tablet TAKE 1 TABLET BY MOUTH EVERY MORNING  . predniSONE (DELTASONE) 1 MG tablet Take 1 mg by mouth daily with breakfast.  . predniSONE (DELTASONE) 5 MG tablet Take 5 mg by mouth daily with breakfast.  . [DISCONTINUED] pantoprazole (PROTONIX) 40 MG tablet TAKE 1 TABLET BY MOUTH EVERY DAY  . [DISCONTINUED] predniSONE (DELTASONE) 1 MG tablet See taper instructions. To be used along with 5 mg  . [DISCONTINUED] predniSONE (DELTASONE) 10 MG tablet Take as directed for 12 days.  Daily dose 6,6,5,5,4,4,3,3,2,2,1,1.  . [DISCONTINUED] predniSONE (DELTASONE) 5 MG tablet Take 1-2 tablets (5-10 mg total) by mouth daily with breakfast. See taper TAKE WITH 1 MG TABLETS   No facility-administered encounter medications on file as of 03/03/2018.    Allergies  Allergen Reactions  . Morphine And Related Nausea And Vomiting  . Prochlorperazine     REACTION: nerve reaction  . Simvastatin     REACTION: leg cramps  . Sulfa Antibiotics    Patient Active Problem List   Diagnosis Date Noted  . Osteoarthritis of right hip 12/02/2017  . GERD (gastroesophageal reflux disease) 12/15/2015  . Polyp of colon, adenomatous 10/11/2014  . Vitamin  D deficiency 05/26/2014  . Osteoporosis 05/26/2014  . Polymyalgia rheumatica ([place]) 12/01/2010  . CAD (coronary artery disease) 09/09/2007  . Hematuria 09/09/2007  . Depression 03/20/2007  . Hypothyroid 02/13/1999   Social History   Socioeconomic History  . Marital status: Married    Spouse name: Not on file  . Number of children: Not on file  . Years of education: Not on file  . Highest education level: Not on file  Occupational History  . Not on file  Social Needs  . Financial resource strain: Not on file  . Food insecurity:    Worry:  Not on file    Inability: Not on file  . Transportation needs:    Medical: Not on file    Non-medical: Not on file  Tobacco Use  . Smoking status: Never Smoker  . Smokeless tobacco: Never Used  Substance and Sexual Activity  . Alcohol use: No    Alcohol/week: 0.0 standard drinks  . Drug use: No  . Sexual activity: Not on file  Lifestyle  . Physical activity:    Days per week: Not on file    Minutes per session: Not on file  . Stress: Not on file  Relationships  . Social connections:    Talks on phone: Not on file    Gets together: Not on file    Attends religious service: Not on file    Active member of club or organization: Not on file    Attends meetings of clubs or organizations: Not on file    Relationship status: Not on file  . Intimate partner violence:    Fear of current or ex partner: Not on file    Emotionally abused: Not on file    Physically abused: Not on file    Forced sexual activity: Not on file  Other Topics Concern  . Not on file  Social History Narrative   Husband and 2 adult children live at home. No grandkids. Son with epilepsy.       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     Ms. Wiggs family history includes Cancer in her father and mother; Colon cancer in her maternal uncle; Heart disease in her father; Hyperlipidemia in her father and mother; Hypertension in her father and mother.      Objective:    Vitals:   03/03/18 1111  BP: 124/80  Pulse: 72    Physical Exam Well-developed older white female in no acute distress, pleasant accompanied by her husband.  Height 5 foot 1, weight 169, BMI 31.4.  HEENT ;nontraumatic normocephalic EOMI PERRLA sclera anicteric oral mucosa moist, Cardiovascular; regular rate and rhythm with S1-S2.  Pulmonary ;clear bilaterally, Abdomen ;soft, nontender nondistended bowel sounds are active there is no palpable mass or hepatosplenomegaly, Rectal ;exam not done, Extremities ;no clubbing cyanosis or  edema skin warm and dry, Neuropsych ;alert and oriented, grossly nonfocal mood and affect appropriate      Assessment & Plan:   #5 68 year old white female with history of Schatzki's ring of the distal esophagus status post dilation 2017 who comes in today with complaints of recurrent dysphasia which has become progressively worse. She has had severe symptoms over the past 4 to 6 weeks with very frequent episodes of regurgitation and dysphagia with every meal.  She has lost about 10 pounds. No complaints of heartburn or indigestion, and no abdominal pain  #2 GERD-generally asymptomatic and on pantoprazole 40 mg p.o. daily #3 history of adenomatous  colon polyps-up-to-date with colonoscopy last done in 2016 and will be due for follow-up colonoscopy 2021 #4.  Coronary artery disease #5.  Polymyalgia rheumatica on chronic low-dose steroids  Plan; Continue Protonix 40 mg p.o. every morning. We reviewed an antireflux regimen to include n.p.o. for 3 hours prior to bedtime, and emphasized the benefit of elevating the head of the bed and/or use of a wedge pillow to elevate the back 45 degrees while sleeping.  Patient will be scheduled for upper endoscopy with esophageal dilation with Dr. Havery Moros.  Procedure was discussed in detail with the patient including indications risks and benefits and she is agreeable to proceed.   Genia Harold PA-C 03/03/2018   Cc: [name], MD

## 2018-03-03 NOTE — Progress Notes (Signed)
Agree with assessment and plan as outlined.  

## 2018-03-11 ENCOUNTER — Ambulatory Visit (AMBULATORY_SURGERY_CENTER): Payer: PPO | Admitting: Gastroenterology

## 2018-03-11 ENCOUNTER — Encounter: Payer: Self-pay | Admitting: Gastroenterology

## 2018-03-11 VITALS — BP 133/78 | HR 67 | Temp 98.6°F | Resp 14 | Ht 61.0 in | Wt 169.0 lb

## 2018-03-11 DIAGNOSIS — K259 Gastric ulcer, unspecified as acute or chronic, without hemorrhage or perforation: Secondary | ICD-10-CM | POA: Diagnosis not present

## 2018-03-11 DIAGNOSIS — K222 Esophageal obstruction: Secondary | ICD-10-CM | POA: Diagnosis not present

## 2018-03-11 DIAGNOSIS — K5710 Diverticulosis of small intestine without perforation or abscess without bleeding: Secondary | ICD-10-CM

## 2018-03-11 DIAGNOSIS — K219 Gastro-esophageal reflux disease without esophagitis: Secondary | ICD-10-CM | POA: Diagnosis not present

## 2018-03-11 DIAGNOSIS — K449 Diaphragmatic hernia without obstruction or gangrene: Secondary | ICD-10-CM

## 2018-03-11 DIAGNOSIS — R1310 Dysphagia, unspecified: Secondary | ICD-10-CM | POA: Diagnosis not present

## 2018-03-11 MED ORDER — SODIUM CHLORIDE 0.9 % IV SOLN: 500.0000 mL | Freq: Once | INTRAVENOUS | Status: DC

## 2018-03-11 NOTE — Patient Instructions (Signed)
Discharge instructions given. Handouts on Gastritis,hiatal Hernia and a Dilatation diet. Resume previous medications. Avoid NSAIDS. YOU HAD AN ENDOSCOPIC PROCEDURE TODAY AT [facility]:   Refer to the procedure report that was given to you for any specific questions about what was found during the examination.  If the procedure report does not answer your questions, please call your gastroenterologist to clarify.  If you requested that your care partner not be given the details of your procedure findings, then the procedure report has been included in a sealed envelope for you to review at your convenience later.  YOU SHOULD EXPECT: Some feelings of bloating in the abdomen. Passage of more gas than usual.  Walking can help get rid of the air that was put into your GI tract during the procedure and reduce the bloating. If you had a lower endoscopy (such as a colonoscopy or flexible sigmoidoscopy) you may notice spotting of blood in your stool or on the toilet paper. If you underwent a bowel prep for your procedure, you may not have a normal bowel movement for a few days.  Please Note:  You might notice some irritation and congestion in your nose or some drainage.  This is from the oxygen used during your procedure.  There is no need for concern and it should clear up in a day or so.  SYMPTOMS TO REPORT IMMEDIATELY:   Following upper endoscopy (EGD)  Vomiting of blood or coffee ground material  New chest pain or pain under the shoulder blades  Painful or persistently difficult swallowing  New shortness of breath  Fever of 100F or higher  Black, tarry-looking stools  For urgent or emergent issues, a gastroenterologist can be reached at any hour by calling [phone].   DIET:  We do recommend a small meal at first, but then you may proceed to your regular diet.  Drink plenty of fluids but you should avoid alcoholic beverages for 24 hours.  ACTIVITY:  You should plan to  take it easy for the rest of today and you should NOT DRIVE or use heavy machinery until tomorrow (because of the sedation medicines used during the test).    FOLLOW UP: Our staff will call the number listed on your records the next business day following your procedure to check on you and address any questions or concerns that you may have regarding the information given to you following your procedure. If we do not reach you, we will leave a message.  However, if you are feeling well and you are not experiencing any problems, there is no need to return our call.  We will assume that you have returned to your regular daily activities without incident.  If any biopsies were taken you will be contacted by phone or by letter within the next 1-3 weeks.  Please call us at [phone] if you have not heard about the biopsies in 3 weeks.    SIGNATURES/CONFIDENTIALITY: You and/or your care partner have signed paperwork which will be entered into your electronic medical record.  These signatures attest to the fact that that the information above on your After Visit Summary has been reviewed and is understood.  Full responsibility of the confidentiality of this discharge information lies with you and/or your care-partner.

## 2018-03-11 NOTE — Progress Notes (Signed)
PT taken to PACU. Monitors in place. VSS. Report given to RN. 

## 2018-03-11 NOTE — Op Note (Signed)
[facility] Patient Name: [name] Procedure Date: 03/11/2018 8:27 AM MRN: [id number] Endoscopist: Remo Lipps P. Havery Moros , MD Age: 68 Referring MD:  Date of Birth: [birth year] Gender: Female Account #: [id number] Procedure:                Upper GI endoscopy Indications:              Dysphagia, history of stricture s/p dilation in the                            past Medicines:                Monitored Anesthesia Care Procedure:                Pre-Anesthesia Assessment:                           - Prior to the procedure, a History and Physical                            was performed, and patient medications and                            allergies were reviewed. The patient's tolerance of                            previous anesthesia was also reviewed. The risks                            and benefits of the procedure and the sedation                            options and risks were discussed with the patient.                            All questions were answered, and informed consent                            was obtained. Prior Anticoagulants: The patient has                            taken no previous anticoagulant or antiplatelet                            agents. ASA Grade Assessment: III - A patient with                            severe systemic disease. After reviewing the risks                            and benefits, the patient was deemed in                            satisfactory condition to undergo the procedure.  After obtaining informed consent, the endoscope was                            passed under direct vision. Throughout the                            procedure, the patient's blood pressure, pulse, and                            oxygen saturations were monitored continuously. The                            Endoscope was introduced through the mouth, and                            advanced to the second part of duodenum.  The upper                            GI endoscopy was accomplished without difficulty.                            The patient tolerated the procedure well. Scope In: Scope Out: Findings:                 Esophagogastric landmarks were identified: the                            Z-line was found at 36 cm, the gastroesophageal                            junction was found at 36 cm and the upper extent of                            the gastric folds was found at 39 cm from the                            incisors.                           A 3 cm hiatal hernia was present.                           One benign-appearing, intrinsic stenosis was found                            36 cm from the incisors. This stenosis measured                            less than one cm (in length). A TTS dilator was                            passed through the scope. Dilation with a 16-17-18  mm balloon dilator was performed to 16 mm, 17 mm                            and then 18 mm after which an appropriate mucosal                            wrent was noted.                           The exam of the esophagus was otherwise normal.                           Mild inflammation characterized by erosions and                            erythema was found in the gastric antrum. Biopsies                            were taken with a cold forceps for Helicobacter                            pylori testing.                           The exam of the stomach was otherwise normal.                           A large diverticulum was found in the second                            portion of the duodenum.                           The exam of the duodenum was otherwise normal. Complications:            No immediate complications. Estimated blood loss:                            Minimal. Estimated Blood Loss:     Estimated blood loss was minimal. Impression:               - Esophagogastric landmarks  identified.                           - 3 cm hiatal hernia.                           - Benign-appearing esophageal stenosis. Dilated to                            42mm with good result                           - Gastritis. Biopsied.                           -  Duodenal diverticulum. Recommendation:           - Patient has a contact number available for                            emergencies. The signs and symptoms of potential                            delayed complications were discussed with the                            patient. Return to normal activities tomorrow.                            Written discharge instructions were provided to the                            patient.                           - Post dilation diet.                           - Continue present medications.                           - No NSAIDs                           - Await pathology results. Remo Lipps P. Reighan Hipolito, MD 03/11/2018 8:57:35 AM This report has been signed electronically.

## 2018-03-11 NOTE — Progress Notes (Signed)
Called to room to assist during endoscopic procedure.  Patient ID and intended procedure confirmed with present staff. Received instructions for my participation in the procedure from the performing physician.  

## 2018-03-12 ENCOUNTER — Telehealth: Payer: Self-pay

## 2018-03-12 NOTE — Telephone Encounter (Signed)
Left message on f/u call 

## 2018-03-12 NOTE — Telephone Encounter (Signed)
Attempted to reach patient for post f/u call. No answer. Left message that we will make another attempt to reach her again later today and for her to please not hesitate to call us if she has any questions/concerns regarding her care.

## 2018-04-07 ENCOUNTER — Ambulatory Visit: Payer: PPO | Admitting: Physician Assistant

## 2018-04-07 ENCOUNTER — Ambulatory Visit (INDEPENDENT_AMBULATORY_CARE_PROVIDER_SITE_OTHER): Payer: PPO

## 2018-04-07 ENCOUNTER — Ambulatory Visit (INDEPENDENT_AMBULATORY_CARE_PROVIDER_SITE_OTHER): Payer: PPO | Admitting: Orthopaedic Surgery

## 2018-04-07 ENCOUNTER — Encounter (INDEPENDENT_AMBULATORY_CARE_PROVIDER_SITE_OTHER): Payer: Self-pay | Admitting: Orthopaedic Surgery

## 2018-04-07 VITALS — BP 149/93 | HR 85 | Ht 61.5 in | Wt 155.0 lb

## 2018-04-07 DIAGNOSIS — M25511 Pain in right shoulder: Secondary | ICD-10-CM

## 2018-04-07 MED ORDER — HYDROCODONE-ACETAMINOPHEN 5-325 MG PO TABS: 1.0000 | ORAL_TABLET | Freq: Four times a day (QID) | ORAL | 0 refills | Status: DC | PRN

## 2018-04-07 MED ORDER — DIAZEPAM 5 MG PO TABS: ORAL_TABLET | ORAL | 0 refills | Status: DC

## 2018-04-07 NOTE — Progress Notes (Signed)
Office Visit Note   Patient: [name]           Date of Birth: April 09, 1950           MRN: [id number] Visit Date: 04/07/2018              Requested by: [name], MD [place], [place] [address] PCP: [name], MD   Assessment & Plan: Visit Diagnoses:  1. Acute pain of right shoulder     Plan: Patient has a sling at home she can use.  Work slip given no work x3 days.  Patient needs an MRI scan since she has severe pain inability to move the shoulder and previous x-ray showed some calcific tendinopathy she likely has painful rotator cuff tear.  If MRI only shows a partial tearing we can consider repeating the injection.  Patient is already had anti-inflammatories use of ice, heat, subacromial injection in December and has had intermittent problems with her shoulder now for at least 2 years.  Prescription for Norco sent in for her severe pain.  Prescription for Valium sent in for her claustrophobia.  Follow-Up Instructions: No follow-ups on file.   Orders:  Orders Placed This Encounter  Procedures  . XR Shoulder Right   Meds ordered this encounter  Medications  . HYDROcodone-acetaminophen (NORCO/VICODIN) 5-325 MG tablet    Sig: Take 1 tablet by mouth every 6 (six) hours as needed for moderate pain.    Dispense:  30 tablet    Refill:  0      Procedures: No procedures performed   Clinical Data: No additional findings.   Subjective: Chief Complaint  Patient presents with  . 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  She had previous injection subacromially by Dr. Junius Roads in December 2019 for similar problem but pain was not as severe and at that time she could still move her arm.  Previous x-rays back in 2018 showed  calcific tendinopathy of the supraspinatus.  Past injury to her shoulder several years ago when she was diagnosed with rotator cuff tear but is not had an MRI scan performed.  Today she is in a sweatshirt without a bra and has her right arm underneath the sweatshirt.  She states with any motion of 10 to 15 degrees with either rotation flexion abduction her pain is severe rated at 10 out of 10.  Review of Systems positive for arthritis right hip, coronary disease, Polly myalgia rheumatica, vitamin D deficiency, GERD, cholecystectomy otherwise negative is as a pertains HPI.   Objective: Vital Signs: BP (!) 149/93   Pulse 85   Ht 5' 1.5" (1.562 m)   Wt 155 lb (70.3 kg)   BMI 28.81 kg/m   Physical Exam Constitutional:      Appearance: She is well-developed.  HENT:     Head: Normocephalic.     Right Ear: External ear normal.     Left Ear: External ear normal.  Eyes:     Pupils: Pupils are equal, round, and reactive to light.  Neck:     Thyroid: No thyromegaly.     Trachea: No tracheal deviation.  Cardiovascular:     Rate and Rhythm: Normal rate.  Pulmonary:     Effort:  Pulmonary effort is normal.  Abdominal:     Palpations: Abdomen is soft.  Skin:    General: Skin is warm and dry.  Neurological:     Mental Status: She is alert and oriented to person, place, and time.  Psychiatric:        Behavior: Behavior normal.     Ortho Exam patient has good cervical range of motion no supraclavicular lymphadenopathy.  She has exquisite tenderness over the long head of the biceps and severe pain with flexion more than 10 to 15 degrees.  Pain with rotation 20 degrees internal and external rotation no increased warmth of the shoulder.  Axillary sensation median ulnar radial sensation is intact of the hand good grip strength.  No distal migration of the biceps muscle.  Specialty Comments:  No specialty comments available.  Imaging: Xr Shoulder Right  Result Date: 04/07/2018 2 view x-rays  right shoulder obtained and reviewed.  This shows glenohumeral joint is reduced.  There is calcific tendinopathy involving the supraspinatus tendon adjacent to the greater tuberosity unchanged from 2018 images. Impression: Right shoulder rotator cuff calcific tendinopathy    PMFS History: Patient Active Problem List   Diagnosis Date Noted  . Lower esophageal ring ([place]) 03/03/2018  . Osteoarthritis of right hip 12/02/2017  . GERD (gastroesophageal reflux disease) 12/15/2015  . Polyp of colon, adenomatous 10/11/2014  . Vitamin D deficiency 05/26/2014  . Osteoporosis 05/26/2014  . Polymyalgia rheumatica ([place]) 12/01/2010  . CAD (coronary artery disease) 09/09/2007  . Hematuria 09/09/2007  . Depression 03/20/2007  . Hypothyroid 02/13/1999   Past Medical History:  Diagnosis Date  . Anxiety   . Arthritis   . CAD (coronary artery disease)   . Cataract   . Depression   . DISORDER, MENOPAUSAL NOS 03/20/2007   Qualifier: Diagnosis of  By: Arnoldo Morale MD, Balinda Quails   . GERD (gastroesophageal reflux disease)   . Hyperlipidemia   . Hypothyroidism   . MYOCARDIAL INFARCTION, HX OF 09/09/2007   Qualifier: Diagnosis of  By: Leanne Chang MD, Bruce    . Osteoporosis   . Polymyalgia rheumatica (HCC)     Family History  Problem Relation Age of Onset  . Hypertension Father   . Heart disease Father        Mi age 51, 52, 63 and 5.  . Cancer Father   . Hyperlipidemia Father   . Colon cancer Maternal Uncle   . Hypertension Mother   . Cancer Mother   . Hyperlipidemia Mother     Past Surgical History:  Procedure Laterality Date  . CATARACT EXTRACTION    . CESAREAN SECTION    . CHOLECYSTECTOMY N/A 11/24/2012   Procedure: LAPAROSCOPIC CHOLECYSTECTOMY;  Surgeon: Ralene Ok, MD;  Location: [facility];  Service: General;  Laterality: N/A;  . COSMETIC SURGERY    . EYE SURGERY     eye lids lifted  . PTCA     Social History   Occupational History  . Not on file  Tobacco Use  . Smoking status: Never  Smoker  . Smokeless tobacco: Never Used  Substance and Sexual Activity  . Alcohol use: No    Alcohol/week: 0.0 standard drinks  . Drug use: No  . Sexual activity: Not on file

## 2018-04-09 ENCOUNTER — Telehealth (INDEPENDENT_AMBULATORY_CARE_PROVIDER_SITE_OTHER): Payer: Self-pay | Admitting: Orthopaedic Surgery

## 2018-04-09 NOTE — Telephone Encounter (Signed)
Talked with patient and advised her of message below per Dr. Lorin Mercy.  Patient stated that she will just wait until she has her MRI done.

## 2018-04-09 NOTE — Telephone Encounter (Signed)
Ucall. OK but if her rotator cuff has a complete tear it may tear worse before it can be fixed if cortisone injection is done and she may not do as well . She can make appt if she wants for injection

## 2018-04-09 NOTE — Telephone Encounter (Signed)
See message below.  Is it okay for Cort.injection?  Please advise.  Thank you.

## 2018-04-09 NOTE — Telephone Encounter (Signed)
Patient called asked if she can get the cortisone injection. Patient said she is not going to have the surgery right away and would like to get the injection. The number to contact patient is [phone]

## 2018-04-21 ENCOUNTER — Other Ambulatory Visit: Payer: PPO

## 2018-04-21 ENCOUNTER — Telehealth (INDEPENDENT_AMBULATORY_CARE_PROVIDER_SITE_OTHER): Payer: Self-pay | Admitting: Orthopaedic Surgery

## 2018-04-21 ENCOUNTER — Ambulatory Visit
Admission: RE | Admit: 2018-04-21 | Discharge: 2018-04-21 | Disposition: A | Discharge status: Home / Self Care | Payer: PPO | Source: Ambulatory Visit | Attending: Orthopaedic Surgery | Admitting: Orthopaedic Surgery

## 2018-04-21 DIAGNOSIS — M25511 Pain in right shoulder: Secondary | ICD-10-CM | POA: Diagnosis not present

## 2018-04-21 NOTE — Telephone Encounter (Signed)
Patient called left voicemail message stating she is scheduled for an MRI today and asked if she needed the MRI since she is not having any pain right now. The number to contact patient is [phone]

## 2018-04-21 NOTE — Telephone Encounter (Signed)
Her choice. If she has good motion and has no pain OK to cancel. ucall thanks

## 2018-04-21 NOTE — Telephone Encounter (Signed)
I tried calling, no answer. LM advising per Dr Lorin Mercy

## 2018-04-21 NOTE — Telephone Encounter (Signed)
Please advise. Thanks.  

## 2018-04-23 ENCOUNTER — Ambulatory Visit (INDEPENDENT_AMBULATORY_CARE_PROVIDER_SITE_OTHER): Payer: PPO | Admitting: Orthopaedic Surgery

## 2018-04-29 ENCOUNTER — Ambulatory Visit (INDEPENDENT_AMBULATORY_CARE_PROVIDER_SITE_OTHER): Payer: PPO | Admitting: Orthopaedic Surgery

## 2018-04-29 ENCOUNTER — Encounter (INDEPENDENT_AMBULATORY_CARE_PROVIDER_SITE_OTHER): Payer: Self-pay | Admitting: Orthopaedic Surgery

## 2018-04-29 ENCOUNTER — Other Ambulatory Visit: Payer: Self-pay

## 2018-04-29 VITALS — BP 155/95 | HR 79 | Ht 61.5 in | Wt 155.0 lb

## 2018-04-29 DIAGNOSIS — M75111 Incomplete rotator cuff tear or rupture of right shoulder, not specified as traumatic: Secondary | ICD-10-CM

## 2018-04-29 NOTE — Progress Notes (Signed)
Office Visit Note   Patient: [name]           Date of Birth: [birth year]           MRN: [id number] Visit Date: 04/29/2018              Requested by: [name], MD [place], [place] [address] PCP: [name], MD   Assessment & Plan: Visit Diagnoses:  1. Incomplete tear of right rotator cuff, unspecified whether traumatic     Plan: I reviewed the MRI report with the patient gave her a copy.  She has some partial tearing infraspinatus and supraspinatus without full-thickness tear.  If she has a recurrence of symptoms she can return currently her symptoms are not severe enough to consider surgery.  She has no full-thickness tearing and no atrophy of the rotator cuff muscles.  Follow-Up Instructions: No follow-ups on file.   Orders:  No orders of the defined types were placed in this encounter.  No orders of the defined types were placed in this encounter.     Procedures: No procedures performed   Clinical Data: No additional findings.   Subjective: Chief Complaint  Patient presents with  . Right Shoulder - Pain, Follow-up    MRI Right Shoulder Review    HPI 68 year old female returns for ongoing problems with her right shoulder she states previous injection gave her some improvement she avoided activities outstretch reaching overhead activities and states her shoulder is significantly improved.  Occasionally she has had some mild symptoms but nothing as severe as previously.  She is here for review of her MRI scan  Review of Systems 14 point system update unchanged from 04/07/2018+ coronary artery disease.  Osteoarthritis right hip   Objective: Vital Signs: BP (!) 155/95   Pulse 79   Ht 5' 1.5" (1.562 m)   Wt 155 lb (70.3 kg)   BMI 28.81 kg/m   Physical Exam Constitutional:      Appearance: She is well-developed.  HENT:     Head: Normocephalic.     Right Ear: External ear normal.     Left Ear: External ear normal.  Eyes:      Pupils: Pupils are equal, round, and reactive to light.  Neck:     Thyroid: No thyromegaly.     Trachea: No tracheal deviation.  Cardiovascular:     Rate and Rhythm: Normal rate.  Pulmonary:     Effort: Pulmonary effort is normal.  Abdominal:     Palpations: Abdomen is soft.  Skin:    General: Skin is warm and dry.  Neurological:     Mental Status: She is alert and oriented to person, place, and time.  Psychiatric:        Behavior: Behavior normal.     Ortho Exam patient is able to get her arm up overhead.  No limitation of range of motion.  Station her hand is intact good cervical range of motion.  Specialty Comments:  No specialty comments available.  Imaging: CLINICAL DATA:  Chronic right shoulder pain.  EXAM: MRI OF THE RIGHT SHOULDER WITHOUT CONTRAST  TECHNIQUE: Multiplanar, multisequence MR imaging of the shoulder was performed. No intravenous contrast was administered.  COMPARISON:  Right shoulder x-rays dated April 07, 2018.  FINDINGS: Rotator cuff: Mild supraspinatus tendinosis with bursal surface fraying and small focal partial-thickness bursal surface tear at the insertion. Mild infraspinatus tendinosis with high-grade partial-thickness bursal surface tear of the distal tendon. Small intrasubstance  tears along the infraspinatus myotendinous junction. The teres minor and subscapularis tendons are unremarkable.  Muscles: No atrophy or abnormal signal of the muscles of the rotator cuff.  Biceps long head: Tiny longitudinal intrasubstance tear within the bicipital groove. Normally positioned.  Acromioclavicular Joint: Mild arthropathy of the acromioclavicular joint. Type II acromion. Small subacromial/subdeltoid bursal fluid.  Glenohumeral Joint: No joint effusion. No chondral defect.  Labrum: Grossly intact, but evaluation is limited by lack of intraarticular fluid.  Bones:  No acute fracture or dislocation.  No focal bone lesion.   Other: None.  IMPRESSION: 1. Bursal surface fraying and small focal partial-thickness bursal surface tear of the supraspinatus tendon at the insertion. 2. High-grade partial-thickness bursal surface tear of the distal infraspinatus tendon. 3. Tiny longitudinal intrasubstance tear of the biceps tendon within the bicipital groove. 4. Mild acromioclavicular osteoarthritis. 5. Mild subacromial/subdeltoid bursitis.   Electronically Signed   By: Titus Dubin M.D.   On: 04/22/2018 09:54  PMFS History: Patient Active Problem List   Diagnosis Date Noted  . Lower esophageal ring ([place]) 03/03/2018  . Osteoarthritis of right hip 12/02/2017  . GERD (gastroesophageal reflux disease) 12/15/2015  . Polyp of colon, adenomatous 10/11/2014  . Vitamin D deficiency 05/26/2014  . Osteoporosis 05/26/2014  . Polymyalgia rheumatica ([place]) 12/01/2010  . CAD (coronary artery disease) 09/09/2007  . Hematuria 09/09/2007  . Depression 03/20/2007  . Hypothyroid 02/13/1999   Past Medical History:  Diagnosis Date  . Anxiety   . Arthritis   . CAD (coronary artery disease)   . Cataract   . Depression   . DISORDER, MENOPAUSAL NOS 03/20/2007   Qualifier: Diagnosis of  By: Arnoldo Morale MD, Balinda Quails   . GERD (gastroesophageal reflux disease)   . Hyperlipidemia   . Hypothyroidism   . MYOCARDIAL INFARCTION, HX OF 09/09/2007   Qualifier: Diagnosis of  By: Leanne Chang MD, Bruce    . Osteoporosis   . Polymyalgia rheumatica (HCC)     Family History  Problem Relation Age of Onset  . Hypertension Father   . Heart disease Father        Mi age 70, 35, 58 and 52.  . Cancer Father   . Hyperlipidemia Father   . Colon cancer Maternal Uncle   . Hypertension Mother   . Cancer Mother   . Hyperlipidemia Mother     Past Surgical History:  Procedure Laterality Date  . CATARACT EXTRACTION    . CESAREAN SECTION    . CHOLECYSTECTOMY N/A 11/24/2012   Procedure: LAPAROSCOPIC CHOLECYSTECTOMY;  Surgeon: Ralene Ok,  MD;  Location: [facility];  Service: General;  Laterality: N/A;  . COSMETIC SURGERY    . EYE SURGERY     eye lids lifted  . PTCA     Social History   Occupational History  . Not on file  Tobacco Use  . Smoking status: Never Smoker  . Smokeless tobacco: Never Used  Substance and Sexual Activity  . Alcohol use: No    Alcohol/week: 0.0 standard drinks  . Drug use: No  . Sexual activity: Not on file

## 2018-05-11 DIAGNOSIS — S82832A Other fracture of upper and lower end of left fibula, initial encounter for closed fracture: Secondary | ICD-10-CM | POA: Diagnosis not present

## 2018-05-16 ENCOUNTER — Encounter (INDEPENDENT_AMBULATORY_CARE_PROVIDER_SITE_OTHER): Payer: Self-pay | Admitting: Orthopaedic Surgery

## 2018-05-16 ENCOUNTER — Other Ambulatory Visit: Payer: Self-pay

## 2018-05-16 ENCOUNTER — Ambulatory Visit (INDEPENDENT_AMBULATORY_CARE_PROVIDER_SITE_OTHER): Payer: PPO

## 2018-05-16 ENCOUNTER — Ambulatory Visit (INDEPENDENT_AMBULATORY_CARE_PROVIDER_SITE_OTHER): Payer: PPO | Admitting: Orthopaedic Surgery

## 2018-05-16 VITALS — Ht 62.0 in | Wt 160.0 lb

## 2018-05-16 DIAGNOSIS — R3121 Asymptomatic microscopic hematuria: Secondary | ICD-10-CM | POA: Diagnosis not present

## 2018-05-16 DIAGNOSIS — R9431 Abnormal electrocardiogram [ECG] [EKG]: Secondary | ICD-10-CM | POA: Diagnosis not present

## 2018-05-16 DIAGNOSIS — M130 Polyarthritis, unspecified: Secondary | ICD-10-CM | POA: Diagnosis not present

## 2018-05-16 DIAGNOSIS — M1712 Unilateral primary osteoarthritis, left knee: Secondary | ICD-10-CM | POA: Diagnosis not present

## 2018-05-16 DIAGNOSIS — M545 Low back pain, unspecified: Secondary | ICD-10-CM

## 2018-05-16 DIAGNOSIS — R1310 Dysphagia, unspecified: Secondary | ICD-10-CM | POA: Diagnosis not present

## 2018-05-16 DIAGNOSIS — R310 Gross hematuria: Secondary | ICD-10-CM | POA: Diagnosis not present

## 2018-05-16 DIAGNOSIS — J449 Chronic obstructive pulmonary disease, unspecified: Secondary | ICD-10-CM | POA: Diagnosis not present

## 2018-05-16 DIAGNOSIS — M170 Bilateral primary osteoarthritis of knee: Secondary | ICD-10-CM | POA: Diagnosis not present

## 2018-05-16 DIAGNOSIS — I482 Chronic atrial fibrillation, unspecified: Secondary | ICD-10-CM | POA: Diagnosis not present

## 2018-05-16 MED ORDER — TRAMADOL HCL 50 MG PO TABS: 50.0000 mg | ORAL_TABLET | Freq: Two times a day (BID) | ORAL | 0 refills | Status: DC | PRN

## 2018-05-16 NOTE — Progress Notes (Signed)
Office Visit Note   Patient: [name]           Date of Birth: 1950-02-18           MRN: [id number] Visit Date: 05/16/2018              Requested by: [name], MD [place], [place] [address] PCP: [name], MD   Assessment & Plan: Visit Diagnoses:  1. Acute right-sided low back pain without sciatica     Plan: Patient can ambulate without her boot and can discontinue the boot.  Will obtain arthritis panel since past x-rays several years ago showed some arthritis in the left ankle.  First metatarsal phalangeal joint narrowing and spurring both feet some minimal midfoot changes some knee changes.  Will obtain arthritis panel.  Ultram prescribed for pain.  She states when she laid down waiting on the disc to be brought back to the office by her husband that was at her house she got some improvement in her pain.  Follow-Up Instructions: No follow-ups on file.   Orders:  Orders Placed This Encounter  Procedures   XR Lumbar Spine 2-3 Views   No orders of the defined types were placed in this encounter.     Procedures: No procedures performed   Clinical Data: No additional findings.   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  She had a CT urogram done at 9 AM today which was negative.  I seen her last month for problems with the shoulder with incomplete rotator cuff tear and she states her shoulder is gotten significantly better.  She was recently seen at the outpatient orthopedic urgent care center for ankle pain initially thought to possibly be due to gout aspiration of the ankle was negative and x-rays were obtained which patient states showed evidence of an ankle fracture.  Patient states she left the disc at home  but her husband will go get it.  Review of Systems posterior old L1 compression fracture partial rotator cuff tearing.  First MTP arthritis.  Depression hypothyroidism coronary artery disease.  Vitamin D deficiency GERD osteo-pia otherwise negative is obtains HPI.   Objective: Vital Signs: Ht 5\' 2"  (1.575 m)    Wt 160 lb (72.6 kg)    BMI 29.26 kg/m   Physical Exam Constitutional:      Appearance: She is well-developed.  HENT:     Head: Normocephalic.     Right Ear: External ear normal.     Left Ear: External ear normal.  Eyes:     Pupils: Pupils are equal, round, and reactive to light.  Neck:     Thyroid: No thyromegaly.     Trachea: No tracheal deviation.  Cardiovascular:     Rate and Rhythm: Normal rate.  Pulmonary:     Effort: Pulmonary effort is normal.  Abdominal:     Palpations: Abdomen is soft.  Skin:    General: Skin is warm and dry.  Neurological:     Mental Status: She is alert and oriented to person, place, and time.  Psychiatric:        Behavior: Behavior normal.     Ortho Exam patient has no tenderness or  ankle effusion noted.  No tenderness over the lateral malleolus.  Negative straight leg raising negative logroll the hips tenderness of the right paraspinal muscles mid lumbar region.  Specialty Comments:  No specialty comments available.  Imaging: Left ankle x-rays from [facility] are reviewed.  She does not have definite fracture present.   PMFS History: Patient Active Problem List   Diagnosis Date Noted   Lower esophageal ring ([place]atzki) 03/03/2018   Osteoarthritis of right hip 12/02/2017   GERD (gastroesophageal reflux disease) 12/15/2015   Polyp of colon, adenomatous 10/11/2014   Vitamin D deficiency 05/26/2014   Osteoporosis 05/26/2014   Polymyalgia rheumatica ([place]) 12/01/2010   CAD (coronary artery disease) 09/09/2007   Hematuria 09/09/2007   Depression 03/20/2007   Hypothyroid 02/13/1999   Past Medical History:    Diagnosis Date   Anxiety    Arthritis    CAD (coronary artery disease)    Cataract    Depression    DISORDER, MENOPAUSAL NOS 03/20/2007   Qualifier: Diagnosis of  By: Arnoldo Morale MD, Balinda Quails    GERD (gastroesophageal reflux disease)    Hyperlipidemia    Hypothyroidism    MYOCARDIAL INFARCTION, HX OF 09/09/2007   Qualifier: Diagnosis of  By: Leanne Chang MD, Bruce     Osteoporosis    Polymyalgia rheumatica ([place])     Family History  Problem Relation Age of Onset   Hypertension Father    Heart disease Father        Mi age 78, 44, 91 and 56.   Cancer Father    Hyperlipidemia Father    Colon cancer Maternal Uncle    Hypertension Mother    Cancer Mother    Hyperlipidemia Mother     Past Surgical History:  Procedure Laterality Date   CATARACT EXTRACTION     CESAREAN SECTION     CHOLECYSTECTOMY N/A 11/24/2012   Procedure: LAPAROSCOPIC CHOLECYSTECTOMY;  Surgeon: Ralene Ok, MD;  Location: [facility];  Service: General;  Laterality: N/A;   COSMETIC SURGERY     EYE SURGERY     eye lids lifted   PTCA     Social History   Occupational History   Not on file  Tobacco Use   Smoking status: Never Smoker   Smokeless tobacco: Never Used  Substance and Sexual Activity   Alcohol use: No    Alcohol/week: 0.0 standard drinks   Drug use: No   Sexual activity: Not on file

## 2018-05-19 LAB — ANA: Anti Nuclear Antibody (ANA): POSITIVE — AB

## 2018-05-19 LAB — ANTI-NUCLEAR AB-TITER (ANA TITER)
ANA TITER: 1:160 {titer} — ABNORMAL HIGH
ANA Titer 1: 1:40 {titer} — ABNORMAL HIGH

## 2018-05-19 LAB — URIC ACID: Uric Acid, Serum: 5.2 mg/dL (ref 2.5–7.0)

## 2018-05-19 LAB — RHEUMATOID FACTOR: Rheumatoid fact SerPl-aCnc: <14 IU/mL (ref <14)

## 2018-05-19 LAB — SEDIMENTATION RATE: Sed Rate: 67 mm/h — ABNORMAL HIGH (ref 0–30)

## 2018-05-20 NOTE — Addendum Note (Signed)
Addended by: Meyer Cory on: 05/20/2018 11:11 AM   Modules accepted: Orders

## 2018-05-21 ENCOUNTER — Ambulatory Visit (INDEPENDENT_AMBULATORY_CARE_PROVIDER_SITE_OTHER): Payer: PPO | Admitting: Orthopaedic Surgery

## 2018-05-29 ENCOUNTER — Telehealth: Payer: Self-pay | Admitting: Family Medicine

## 2018-05-29 NOTE — Telephone Encounter (Signed)
See note

## 2018-05-29 NOTE — Telephone Encounter (Signed)
Please call and schedule appointment.

## 2018-05-29 NOTE — Telephone Encounter (Signed)
Patient called and says she called earlier today to leave a message asking Dr. Yong Channel to write a note saying she shouldn't return to work during the pandemic due to her health conditions. She says she's supposed to go back to work on Monday and she would like to pick a note up tomorrow. I advised the note is in her chart, a CRM was sent earlier today, and it will be looked at and sent to Dr. Yong Channel, she asks for a call back.

## 2018-05-29 NOTE — Telephone Encounter (Signed)
Needs visit- needs PMR follow up anyway- try video visit if possible- Health team will cover phone visit if needed as well.

## 2018-05-29 NOTE — Telephone Encounter (Signed)
Copied from [place] [id number]. Topic: General - Inquiry >> May 29, 2018  9:45 AM Margot Ables wrote: Reason for CRM: Pt asking if she can get letter to her employer ([employer]) writing her out of work as high risk due to her age and potential exposure to [place]. Pt wanting to pick up a letter. Please advise.

## 2018-05-29 NOTE — Telephone Encounter (Signed)
Patient is calling to request PCP call her on different number- [phone].

## 2018-05-29 NOTE — Telephone Encounter (Signed)
Scheduled patient with Dr. Jerline Pain on 04/17 due to Dr. Yong Channel out of the office. Patient stated that she could  Not wait until Monday.

## 2018-05-30 ENCOUNTER — Ambulatory Visit (INDEPENDENT_AMBULATORY_CARE_PROVIDER_SITE_OTHER): Payer: PPO | Admitting: Family Medicine

## 2018-05-30 ENCOUNTER — Encounter: Payer: Self-pay | Admitting: Family Medicine

## 2018-05-30 VITALS — Temp 98.7°F | Wt 160.0 lb

## 2018-05-30 DIAGNOSIS — M816 Localized osteoporosis [Lequesne]: Secondary | ICD-10-CM | POA: Diagnosis not present

## 2018-05-30 DIAGNOSIS — M353 Polymyalgia rheumatica: Secondary | ICD-10-CM

## 2018-05-30 DIAGNOSIS — Z7189 Other specified counseling: Secondary | ICD-10-CM | POA: Diagnosis not present

## 2018-05-30 NOTE — Progress Notes (Signed)
    Chief Complaint:  [name] is a 68 y.o. female who presents today for a virtual office visit with a chief complaint of PMR follow up.   Assessment/Plan:  PMR Stable on prednisone 6 mg daily.  Has referral to rheumatology pending.  Osteoporosis  Worsened by her chronic prednisone use.  She is on Fosamax weekly and tolerating well.  Unfortunately recently suffered ankle fracture due to underlying osteoporosis.  Continue management per orthopedics.  Right Ankle Fracture Managing well. Continue care plan per orthopedics.   High risk for coronavirus Patient is at increased risk for coronavirus complications due to her age and chronic prednisone use.  Letter was given to patient stating that she should stay out of work until pandemic is under better control due to her risk of complication.    Subjective:  HPI:  # PMR Currently on prednisone 6 mg daily.  Overall is doing well.  She is concerned about possibly contracting coronavirus and having complications due to her age and chronic prednisone use.  She has been referred to rheumatology by her orthopedist.  She has not yet scheduled that appointment.  Overall her pain is stable.  #Osteoporosis/ankle fracture Patient recently fractured her right ankle due to underlying osteo-porosis.  She is currently on Fosamax 70 mg weekly and tolerating well.  She has been following with orthopedics for her fracture which seems to be healing well.   ROS: Per HPI  PMH: She reports that she has never smoked. She has never used smokeless tobacco. She reports that she does not drink alcohol or use drugs.      Objective/Observations  Physical Exam: Gen: NAD, resting comfortably Pulm: Normal work of breathing Neuro: Grossly normal, moves all extremities Psych: Normal affect and thought content  Virtual Visit via Video   I connected with Juanell Fairly on 05/30/18 at 11:20 AM EDT by a video enabled telemedicine application and verified that I  am speaking with the correct person using two identifiers. I discussed the limitations of evaluation and management by telemedicine and the availability of in person appointments. The patient expressed understanding and agreed to proceed.   Patient location: Home Provider location: [place] participating in the virtual visit: Myself and Patient     Algis Greenhouse. Jerline Pain, MD 05/30/2018 11:21 AM

## 2018-06-02 ENCOUNTER — Other Ambulatory Visit: Payer: Self-pay | Admitting: Family Medicine

## 2018-06-03 ENCOUNTER — Telehealth: Payer: Self-pay

## 2018-06-03 NOTE — Telephone Encounter (Signed)
Pharmacy called asking for clarification on prednisone rx.

## 2018-06-03 NOTE — Telephone Encounter (Signed)
May refill then. Tell her to try to do 6 mg six days a week with 5 mg one day a week for now and after 2 weeks go down to 6 mg five days a week and 5 mg two days a week to see if we can get her lower with a much slower taper.

## 2018-06-03 NOTE — Telephone Encounter (Signed)
Called patient she states that if she goes below 6mg  of prednisone she hurts so bad that she can not walk. She has called to make app with rheumatology but they are not taking new patient until after everything settles down with covid-19

## 2018-06-04 NOTE — Telephone Encounter (Signed)
Please call pharmacy with clarification.

## 2018-06-04 NOTE — Telephone Encounter (Signed)
Latoya at pharmacy was given clarification. No further action needed!

## 2018-06-12 ENCOUNTER — Ambulatory Visit (INDEPENDENT_AMBULATORY_CARE_PROVIDER_SITE_OTHER): Payer: PPO

## 2018-06-12 ENCOUNTER — Encounter: Payer: Self-pay | Admitting: Family Medicine

## 2018-06-12 ENCOUNTER — Ambulatory Visit (INDEPENDENT_AMBULATORY_CARE_PROVIDER_SITE_OTHER): Payer: PPO | Admitting: Orthopaedic Surgery

## 2018-06-12 ENCOUNTER — Other Ambulatory Visit: Payer: Self-pay

## 2018-06-12 ENCOUNTER — Encounter (INDEPENDENT_AMBULATORY_CARE_PROVIDER_SITE_OTHER): Payer: Self-pay | Admitting: Orthopaedic Surgery

## 2018-06-12 DIAGNOSIS — M9261 Juvenile osteochondrosis of tarsus, right ankle: Secondary | ICD-10-CM

## 2018-06-12 DIAGNOSIS — M6788 Other specified disorders of synovium and tendon, other site: Secondary | ICD-10-CM | POA: Diagnosis not present

## 2018-06-12 MED ORDER — PREDNISONE 10 MG (21) PO TBPK: ORAL_TABLET | ORAL | 0 refills | Status: DC

## 2018-06-12 MED ORDER — DICLOFENAC SODIUM 2 % TD SOLN: 2.0000 g | Freq: Two times a day (BID) | TRANSDERMAL | 3 refills | Status: DC | PRN

## 2018-06-12 NOTE — Progress Notes (Signed)
Office Visit Note   Patient: [name]           Date of Birth: [birth year]           MRN: [id number] Visit Date: 06/12/2018              Requested by: [name], MD [place], [place] [address] PCP: [name], MD   Assessment & Plan: Visit Diagnoses:  1. Achilles tendinosis of right lower extremity   2. Haglund's deformity of right heel     Plan: Impression is acute exacerbation of Achilles tendinosis with Haglund's deformity.  We discussed the condition in detail and the associated treatment options.  For now we will begin with a Cam boot which he already has at home plus a prednisone taper plus Pennsaid prescription.  I gave her a sample of Pennsaid today.  She is to take it easy until this feels better.  Questions encouraged and answered.  Follow-up as needed.  Follow-Up Instructions: Return if symptoms worsen or fail to improve.   Orders:  Orders Placed This Encounter  Procedures  . XR Foot 2 Views Right   Meds ordered this encounter  Medications  . predniSONE (STERAPRED UNI-PAK 21 TAB) 10 MG (21) TBPK tablet    Sig: Take as directed    Dispense:  21 tablet    Refill:  0  . Diclofenac Sodium (PENNSAID) 2 % SOLN    Sig: Apply 2 g topically 2 (two) times daily as needed (to affected area).    Dispense:  112 g    Refill:  3      Procedures: No procedures performed   Clinical Data: No additional findings.   Subjective: Chief Complaint  Patient presents with  . Right Foot - Pain    [name] is a 68 year old female comes in with several day history of right heel pain localized over the insertion of the Achilles.  She denies any injuries.  She has noticed that a knot has formed over this area.  Denies any injuries or constitutional symptoms.  She states that the pain is worse with ambulation and with passive stretch.  The knot is tender to palpation.   Review of Systems  Constitutional: Negative.   HENT: Negative.   Eyes:  Negative.   Respiratory: Negative.   Cardiovascular: Negative.   Endocrine: Negative.   Musculoskeletal: Negative.   Neurological: Negative.   Hematological: Negative.   Psychiatric/Behavioral: Negative.   All other systems reviewed and are negative.    Objective: Vital Signs: There were no vitals taken for this visit.  Physical Exam Vitals signs and nursing note reviewed.  Constitutional:      Appearance: She is well-developed.  Pulmonary:     Effort: Pulmonary effort is normal.  Skin:    General: Skin is warm.     Capillary Refill: Capillary refill takes less than 2 seconds.  Neurological:     Mental Status: She is alert and oriented to person, place, and time.  Psychiatric:        Behavior: Behavior normal.        Thought Content: Thought content normal.        Judgment: Judgment normal.     Ortho Exam Right heel exam shows tenderness of the insertion of the Achilles.  There is no evidence of infection.  She has good plantar flexion strength without pain.  She does have pain with passive stretch.  She has a mild Achilles contracture.  Specialty Comments:  No specialty comments available.  Imaging: Xr Foot 2 Views Right  Result Date: 06/12/2018 Significant dorsal calcaneal spurring consistent with chronic Achilles tendinosis and Haglund's deformity    PMFS History: Patient Active Problem List   Diagnosis Date Noted  . Lower esophageal ring ([place]) 03/03/2018  . Osteoarthritis of right hip 12/02/2017  . GERD (gastroesophageal reflux disease) 12/15/2015  . Polyp of colon, adenomatous 10/11/2014  . Vitamin D deficiency 05/26/2014  . Osteoporosis 05/26/2014  . Polymyalgia rheumatica ([place]) 12/01/2010  . CAD (coronary artery disease) 09/09/2007  . Hematuria 09/09/2007  . Depression 03/20/2007  . Hypothyroid 02/13/1999   Past Medical History:  Diagnosis Date  . Anxiety   . Arthritis   . CAD (coronary artery disease)   . Cataract   . Depression   .  DISORDER, MENOPAUSAL NOS 03/20/2007   Qualifier: Diagnosis of  By: Arnoldo Morale MD, Balinda Quails   . GERD (gastroesophageal reflux disease)   . Hyperlipidemia   . Hypothyroidism   . MYOCARDIAL INFARCTION, HX OF 09/09/2007   Qualifier: Diagnosis of  By: Leanne Chang MD, Bruce    . Osteoporosis   . Polymyalgia rheumatica (HCC)     Family History  Problem Relation Age of Onset  . Hypertension Father   . Heart disease Father        Mi age 85, 15, 22 and 27.  . Cancer Father   . Hyperlipidemia Father   . Colon cancer Maternal Uncle   . Hypertension Mother   . Cancer Mother   . Hyperlipidemia Mother     Past Surgical History:  Procedure Laterality Date  . CATARACT EXTRACTION    . CESAREAN SECTION    . CHOLECYSTECTOMY N/A 11/24/2012   Procedure: LAPAROSCOPIC CHOLECYSTECTOMY;  Surgeon: Ralene Ok, MD;  Location: [facility];  Service: General;  Laterality: N/A;  . COSMETIC SURGERY    . EYE SURGERY     eye lids lifted  . PTCA     Social History   Occupational History  . Not on file  Tobacco Use  . Smoking status: Never Smoker  . Smokeless tobacco: Never Used  Substance and Sexual Activity  . Alcohol use: No    Alcohol/week: 0.0 standard drinks  . Drug use: No  . Sexual activity: Not on file

## 2018-07-01 ENCOUNTER — Encounter: Payer: Self-pay | Admitting: Orthopaedic Surgery

## 2018-07-01 ENCOUNTER — Other Ambulatory Visit: Payer: Self-pay

## 2018-07-01 ENCOUNTER — Ambulatory Visit (INDEPENDENT_AMBULATORY_CARE_PROVIDER_SITE_OTHER): Payer: PPO | Admitting: Orthopaedic Surgery

## 2018-07-01 DIAGNOSIS — M7661 Achilles tendinitis, right leg: Secondary | ICD-10-CM

## 2018-07-01 NOTE — Progress Notes (Signed)
Office Visit Note   Patient: [name]           Date of Birth: 1950-11-20           MRN: [id number] Visit Date: 07/01/2018              Requested by: [name], MD [place], [place] [address] PCP: [name], MD   Assessment & Plan: Visit Diagnoses:  1. Achilles tendinitis, right leg     Plan: I have encouraged her to use her boot about half the time during the day to help unload the tendon.  When she resumes work when she gets home she can put her boot back on protect the tendon.  She understands she may do better with a pair of crocs that do not rub on the posterior aspect of the calcaneus.  We discussed MRI scanning as well as discussed excision Haglund's deformity and repeat tendon repair and she understands that this is a long 25-month process to recuperate from.  Hopefully with continued use of the boot her symptoms will settle down.  I will check her back again on a as needed basis.  Follow-Up Instructions: Return if symptoms worsen or fail to improve.   Orders:  No orders of the defined types were placed in this encounter.  No orders of the defined types were placed in this encounter.     Procedures: No procedures performed   Clinical Data: No additional findings.   Subjective: Chief Complaint  Patient presents with  . 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  She been using her boot she is out of work until June 1.  She has been on a prednisone wean and certainly the prednisone may have contributed to her tendinopathy problem.  She is wearing for boots that do not bother her heel.  Other shoes she has not been rubbing against the deformity on the posterior heel.  No skin breakdown.  She states after lunch and by the end of the day she is limping more and pain is increased.  She still has her boot  but is not wearing it today.  Review of Systems update unchanged from 05/16/2018.  Of note is chronic prednisone use previous L1 compression fracture partial rotator cuff tear.  Depression hypothyroidism coronary artery disease vitamin D deficiency.  Right Haglund's deformity.   Objective: Vital Signs: There were no vitals taken for this visit.  Physical Exam Constitutional:      Appearance: She is well-developed.  HENT:     Head: Normocephalic.     Right Ear: External ear normal.     Left Ear: External ear normal.  Eyes:     Pupils: Pupils are equal, round, and reactive to light.  Neck:     Thyroid: No thyromegaly.     Trachea: No tracheal deviation.  Cardiovascular:     Rate and Rhythm: Normal rate.  Pulmonary:     Effort: Pulmonary effort is normal.  Abdominal:     Palpations: Abdomen is soft.  Skin:    General: Skin is warm and dry.  Neurological:     Mental Status: She is alert and oriented to person, place, and time.  Psychiatric:        Behavior: Behavior normal.     Ortho Exam angles deformity on the right negative  on the left.  She has pain when she tries to toe raise.  There is only minimal tenderness with palpation.  No palpable defect in the Achilles tendon.  Specialty Comments:  No specialty comments available.  Imaging: No results found.   PMFS History: Patient Active Problem List   Diagnosis Date Noted  . Lower esophageal ring ([place]atz[place]) 03/03/2018  . Osteoarthritis of right hip 12/02/2017  . GERD (gastroesophageal reflux disease) 12/15/2015  . Polyp of colon, adenomatous 10/11/2014  . Vitamin D deficiency 05/26/2014  . Osteoporosis 05/26/2014  . Polymyalgia rheumatica ([place] [facility]) 12/01/2010  . CAD (coronary artery disease) 09/09/2007  . Hematuria 09/09/2007  . Depression 03/20/2007  . Hypothyroid 02/13/1999   Past Medical History:  Diagnosis Date  . Anxiety   . Arthritis   . CAD (coronary artery disease)   . Cataract   . Depression   .  DISORDER, MENOPAUSAL NOS 03/20/2007   Qualifier: Diagnosis of  By: Arnoldo Morale MD, Balinda Quails   . GERD (gastroesophageal reflux disease)   . Hyperlipidemia   . Hypothyroidism   . MYOCARDIAL INFARCTION, HX OF 09/09/2007   Qualifier: Diagnosis of  By: Leanne Chang MD, Bruce    . Osteoporosis   . Polymyalgia rheumatica (HCC)     Family History  Problem Relation Age of Onset  . Hypertension Father   . Heart disease Father        Mi age 42, 64, 27 and 70.  . Cancer Father   . Hyperlipidemia Father   . Colon cancer Maternal Uncle   . Hypertension Mother   . Cancer Mother   . Hyperlipidemia Mother     Past Surgical History:  Procedure Laterality Date  . CATARACT EXTRACTION    . CESAREAN SECTION    . CHOLECYSTECTOMY N/A 11/24/2012   Procedure: LAPAROSCOPIC CHOLECYSTECTOMY;  Surgeon: Ralene Ok, MD;  Location: [facility];  Service: General;  Laterality: N/A;  . COSMETIC SURGERY    . EYE SURGERY     eye lids lifted  . PTCA     Social History   Occupational History  . Not on file  Tobacco Use  . Smoking status: Never Smoker  . Smokeless tobacco: Never Used  Substance and Sexual Activity  . Alcohol use: No    Alcohol/week: 0.0 standard drinks  . Drug use: No  . Sexual activity: Not on file

## 2018-07-01 NOTE — Progress Notes (Deleted)
Office Visit Note  Patient: [name]             Date of Birth: Jan 20, 1951           MRN: [id number]             PCP: [name], MD Referring: Marybelle Killings, MD Visit Date: 07/15/2018 Occupation: @GUAROCC @  Subjective:  No chief complaint on file.   History of Present Illness: [name] is a 68 y.o. female ***   Activities of Daily Living:  Patient reports morning stiffness for *** {minute/hour:19697}.   Patient {ACTIONS;DENIES/REPORTS:21021675::"Denies"} nocturnal pain.  Difficulty dressing/grooming: {ACTIONS;DENIES/REPORTS:21021675::"Denies"} Difficulty climbing stairs: {ACTIONS;DENIES/REPORTS:21021675::"Denies"} Difficulty getting out of chair: {ACTIONS;DENIES/REPORTS:21021675::"Denies"} Difficulty using hands for taps, buttons, cutlery, and/or writing: {ACTIONS;DENIES/REPORTS:21021675::"Denies"}  No Rheumatology ROS completed.   PMFS History:  Patient Active Problem List   Diagnosis Date Noted  . Lower esophageal ring (Schatzki) 03/03/2018  . Osteoarthritis of right hip 12/02/2017  . GERD (gastroesophageal reflux disease) 12/15/2015  . Polyp of colon, adenomatous 10/11/2014  . Vitamin D deficiency 05/26/2014  . Osteoporosis 05/26/2014  . Polymyalgia rheumatica ([facility]) 12/01/2010  . CAD (coronary artery disease) 09/09/2007  . Hematuria 09/09/2007  . Depression 03/20/2007  . Hypothyroid 02/13/1999    Past Medical History:  Diagnosis Date  . Anxiety   . Arthritis   . CAD (coronary artery disease)   . Cataract   . Depression   . DISORDER, MENOPAUSAL NOS 03/20/2007   Qualifier: Diagnosis of  By: Arnoldo Morale MD, Balinda Quails   . GERD (gastroesophageal reflux disease)   . Hyperlipidemia   . Hypothyroidism   . MYOCARDIAL INFARCTION, HX OF 09/09/2007   Qualifier: Diagnosis of  By: Leanne Chang MD, Bruce    . Osteoporosis   . Polymyalgia rheumatica (HCC)     Family History  Problem Relation Age of Onset  . Hypertension Father   . Heart disease Father        Mi  age 44, 12, 55 and 97.  . Cancer Father   . Hyperlipidemia Father   . Colon cancer Maternal Uncle   . Hypertension Mother   . Cancer Mother   . Hyperlipidemia Mother    Past Surgical History:  Procedure Laterality Date  . CATARACT EXTRACTION    . CESAREAN SECTION    . CHOLECYSTECTOMY N/A 11/24/2012   Procedure: LAPAROSCOPIC CHOLECYSTECTOMY;  Surgeon: Ralene Ok, MD;  Location: [facility];  Service: General;  Laterality: N/A;  . COSMETIC SURGERY    . EYE SURGERY     eye lids lifted  . PTCA     Social History   Social History Narrative   Husband and 2 adult children live at home. No grandkids. Son with epilepsy.       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 No part time.    Immunization History  Administered Date(s) Administered  . Influenza Whole 11/09/2008  . Influenza, High Dose Seasonal PF 10/30/2016, 12/02/2017  . Influenza-Unspecified 11/24/2015  . Pneumococcal Conjugate-13 10/11/2014  . Pneumococcal Polysaccharide-23 12/15/2015  . Td 02/12/2006  . Tdap 04/26/2017  . Zoster 09/19/2010     Objective: Vital Signs: There were no vitals taken for this visit.   Physical Exam   Musculoskeletal Exam: ***  CDAI Exam: CDAI Score: Not documented Patient Global Assessment: Not documented; Provider Global Assessment: Not documented Swollen: Not documented; Tender: Not documented Joint Exam   Not documented   There is currently no information documented on the homunculus. Go to the Rheumatology  activity and complete the homunculus joint exam.  Investigation: Findings:  05/16/18: ANA 1:40 NH, 1:160 NS, uric acid 5.2, sed rate 67, and RF <14  Component     Latest Ref Rng & Units 05/16/2018  ANA Titer 1     titer 1:40 (H)  ANA Pattern 1      Nuclear, Homogeneous (A)  ANA TITER     titer 1:160 (H)  ANA PATTERN      Nuclear, Speckled (A)  Uric Acid, Serum     2.5 - 7.0 mg/dL 5.2  Anti Nuclear Antibody (ANA)     NEGATIVE POSITIVE (A)  Sed Rate     0 - 30 mm/h 67  (H)  RA Latex Turbid.     <14 IU/mL <14   Imaging: Xr Foot 2 Views Right  Result Date: 06/12/2018 Significant dorsal calcaneal spurring consistent with chronic Achilles tendinosis and Haglund's deformity   Recent Labs: Lab Results  Component Value Date   WBC 8.3 07/29/2017   HGB 13.5 07/29/2017   PLT 305.0 07/29/2017   NA 142 07/29/2017   K 3.5 07/29/2017   CL 105 07/29/2017   CO2 28 07/29/2017   GLUCOSE 84 07/29/2017   BUN 13 07/29/2017   CREATININE 0.88 07/29/2017   BILITOT 0.6 07/29/2017   ALKPHOS 86 07/29/2017   AST 14 07/29/2017   ALT 13 07/29/2017   PROT 6.8 07/29/2017   ALBUMIN 4.0 07/29/2017   CALCIUM 9.4 07/29/2017   GFRAA >60 01/06/2016    Speciality Comments: No specialty comments available.  Procedures:  No procedures performed Allergies: Morphine and related; Prochlorperazine; Simvastatin; and Sulfa antibiotics   Assessment / Plan:     Visit Diagnoses: Polyarthritis  Polymyalgia rheumatica ([place])  Primary osteoarthritis of right hip  Localized osteoporosis without current pathological fracture  History of hypothyroidism  History of coronary artery disease  Vitamin D deficiency  Lower esophageal ring (Schatzki)  History of gastroesophageal reflux (GERD)  History of colonic polyps  History of depression   Orders: No orders of the defined types were placed in this encounter.  No orders of the defined types were placed in this encounter.   Face-to-face time spent with patient was *** minutes. Greater than 50% of time was spent in counseling and coordination of care.  Follow-Up Instructions: No follow-ups on file.   Ofilia Neas, PA-C  Note - This record has been created using Dragon software.  Chart creation errors have been sought, but may not always  have been located. Such creation errors do not reflect on  the standard of medical care.

## 2018-07-03 ENCOUNTER — Telehealth: Payer: Self-pay | Admitting: Family Medicine

## 2018-07-03 NOTE — Telephone Encounter (Signed)
See note, patient saw Dr. Jerline Pain last for this issue.   Copied from [place] [phone]. Topic: General - Other >> Jul 03, 2018  1:13 PM Antonieta Iba C wrote: Reason for CRM: pt called in to request her work note to return to work on June 1st. Pt was seen by Dr. Jerline Pain in April and written out for the month of May.    Please assist

## 2018-07-04 ENCOUNTER — Telehealth: Payer: Self-pay | Admitting: Family Medicine

## 2018-07-04 NOTE — Telephone Encounter (Signed)
Copied from [place] [phone]. Topic: General - Other >> Jul 04, 2018 12:06 PM Rainey Pines A wrote: Patient doesn't have printer and would like to come and get letter in office on 07/08/2018 Tuesday.

## 2018-07-04 NOTE — Telephone Encounter (Signed)
Letter has been printed and placed up front for pick up.

## 2018-07-04 NOTE — Telephone Encounter (Signed)
Letter has been printed and placed up front for pick up, per patient's request.

## 2018-07-08 ENCOUNTER — Other Ambulatory Visit: Payer: Self-pay | Admitting: Family Medicine

## 2018-07-09 ENCOUNTER — Other Ambulatory Visit: Payer: Self-pay | Admitting: Family Medicine

## 2018-07-15 ENCOUNTER — Ambulatory Visit: Payer: PPO | Admitting: Rheumatology

## 2018-07-25 DIAGNOSIS — Z1231 Encounter for screening mammogram for malignant neoplasm of breast: Secondary | ICD-10-CM | POA: Diagnosis not present

## 2018-07-25 LAB — HM MAMMOGRAPHY

## 2018-07-30 NOTE — Progress Notes (Signed)
Office Visit Note  Patient: [name]             Date of Birth: November 21, 1950           MRN: [id number]             PCP: [name], MD Referring: Marybelle Killings, MD Visit Date: 08/12/2018 Occupation: LPN at [employer]  Subjective:  Pain and tingling in legs.   History of Present Illness: [name] is a 68 y.o. female seen in consultation per request of Dr. Lorin Mercy.  According to patient's several years ago she started having numbness and tingling in her lower extremities.  She was also having difficulty walking.  She states at the time she was seen by Dr. Phoebe Sharps who diagnosed her with polymyalgia rheumatica and prescribed prednisone.  She has been on prednisone since then.  She states she has never gone below 5 mg of prednisone.  She states once she gets off prednisone her symptoms get worse.  She denies any difficulty getting up from the chair or any difficulty raising her arms.  She states she also have degenerative disc disease of lumbar spine for which she has been seeing Dr. Lorin Mercy for the last 5 years.  She has had epidural injections by Dr. Ernestina Patches x2 which lasted only for short time.  She is also had injections for trochanteric bursitis in the past.  She is concerned about the prednisone use because of osteoporosis.  She is taking Fosamax for osteoporosis.  She is her right foot Achilles spur which causes discomfort.  She has seen Dr. Erlinda Hong in the past for it.  Of the other joints are painful.  Activities of Daily Living:  Patient reports morning stiffness for 10 minutes.   Patient Reports nocturnal pain.  Difficulty dressing/grooming: Denies Difficulty climbing stairs: Denies Difficulty getting out of chair: Denies Difficulty using hands for taps, buttons, cutlery, and/or writing: Denies  Review of Systems  Constitutional: Negative for fatigue, night sweats, weight gain and weight loss.  HENT: Negative for mouth sores, trouble swallowing, trouble  swallowing, mouth dryness and nose dryness.   Eyes: Negative for pain, redness, itching, visual disturbance and dryness.  Respiratory: Negative for cough, shortness of breath and difficulty breathing.   Cardiovascular: Negative for chest pain, palpitations, hypertension, irregular heartbeat and swelling in legs/feet.  Gastrointestinal: Negative for blood in stool, constipation and diarrhea.  Endocrine: Negative for increased urination.  Genitourinary: Negative for painful urination, pelvic pain and vaginal dryness.  Musculoskeletal: Positive for arthralgias, joint pain and morning stiffness. Negative for joint swelling, myalgias, muscle weakness, muscle tenderness and myalgias.  Skin: Negative for color change, rash, hair loss, redness, skin tightness, ulcers and sensitivity to sunlight.  Allergic/Immunologic: Negative for susceptible to infections.  Neurological: Negative for dizziness, headaches, memory loss, night sweats and weakness.  Hematological: Negative for swollen glands.  Psychiatric/Behavioral: Positive for sleep disturbance. Negative for depressed mood and confusion. The patient is not nervous/anxious.     PMFS History:  Patient Active Problem List   Diagnosis Date Noted  . Lower esophageal ring ([place]) 03/03/2018  . Osteoarthritis of right hip 12/02/2017  . GERD (gastroesophageal reflux disease) 12/15/2015  . Polyp of colon, adenomatous 10/11/2014  . Vitamin D deficiency 05/26/2014  . Osteoporosis 05/26/2014  . Polymyalgia rheumatica ([place]) 12/01/2010  . CAD (coronary artery disease) 09/09/2007  . Hematuria 09/09/2007  . Depression 03/20/2007  . Hypothyroid 02/13/1999    Past Medical History:  Diagnosis Date  .  Anxiety   . Arthritis   . CAD (coronary artery disease)   . Cataract   . Depression   . DISORDER, MENOPAUSAL NOS 03/20/2007   Qualifier: Diagnosis of  By: Arnoldo Morale MD, Balinda Quails   . GERD (gastroesophageal reflux disease)   . Hyperlipidemia   . Hypothyroidism    . MYOCARDIAL INFARCTION, HX OF 09/09/2007   Qualifier: Diagnosis of  By: Leanne Chang MD, Bruce    . Osteoporosis   . Polymyalgia rheumatica (HCC)     Family History  Problem Relation Age of Onset  . Hypertension Father   . Heart disease Father        Mi age 63, 72, 49 and 76.  . Cancer Father   . Hyperlipidemia Father   . Colon cancer Maternal Uncle   . Hypertension Mother   . Cancer Mother   . Hyperlipidemia Mother   . Pulmonary fibrosis Sister   . Epilepsy Son   . Healthy Son   . Healthy Son   . Rheum arthritis Niece   . Lupus Niece   . Rheum arthritis Niece    Past Surgical History:  Procedure Laterality Date  . CATARACT EXTRACTION    . CESAREAN SECTION    . CHOLECYSTECTOMY N/A 11/24/2012   Procedure: LAPAROSCOPIC CHOLECYSTECTOMY;  Surgeon: Ralene Ok, MD;  Location: [place];  Service: General;  Laterality: N/A;  . COSMETIC SURGERY    . EYE SURGERY     eye lids lifted  . PTCA     Social History   Social History Narrative   Husband and 2 adult children live at home. No grandkids. Son with epilepsy.       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 No part time.    Immunization History  Administered Date(s) Administered  . Influenza Whole 11/09/2008  . Influenza, High Dose Seasonal PF 10/30/2016, 12/02/2017  . Influenza-Unspecified 11/24/2015  . Pneumococcal Conjugate-13 10/11/2014  . Pneumococcal Polysaccharide-23 12/15/2015  . Td 02/12/2006  . Tdap 04/26/2017  . Zoster 09/19/2010     Objective: Vital Signs: BP (!) 143/88 (BP Location: Right Arm, Patient Position: Sitting, Cuff Size: Normal)   Pulse 75   Resp 13   Ht 5\' 1"  (1.549 m)   Wt 165 lb (74.8 kg)   BMI 31.18 kg/m    Physical Exam Vitals signs and nursing note reviewed.  Constitutional:      Appearance: She is well-developed.  HENT:     Head: Normocephalic and atraumatic.  Eyes:     Conjunctiva/sclera: Conjunctivae normal.  Neck:     Musculoskeletal: Normal range of motion.  Cardiovascular:      Rate and Rhythm: Normal rate and regular rhythm.     Heart sounds: Normal heart sounds.  Pulmonary:     Effort: Pulmonary effort is normal.     Breath sounds: Normal breath sounds.  Abdominal:     General: Bowel sounds are normal.     Palpations: Abdomen is soft.  Lymphadenopathy:     Cervical: No cervical adenopathy.  Skin:    General: Skin is warm and dry.     Capillary Refill: Capillary refill takes less than 2 seconds.  Neurological:     Mental Status: She is alert and oriented to person, place, and time.  Psychiatric:        Behavior: Behavior normal.      Musculoskeletal Exam: C-spine good range of motion with some stiffness.  She has thoracic kyphosis.  She has some limitation range of motion  of her lumbar spine.  Shoulder joints elbow joints wrist joints with good range of motion.  She has DIP and PIP thickening with no synovitis.  Hip joints, knee joints, ankles MTPs PIPs with good range of motion.  She has right Achilles spur.  CDAI Exam: CDAI Score: - Patient Global: -; Provider Global: - Swollen: -; Tender: - Joint Exam   No joint exam has been documented for this visit   There is currently no information documented on the homunculus. Go to the Rheumatology activity and complete the homunculus joint exam.  Investigation: Findings:  05/16/18: ANA 1:40 NH, 1:160 NS, uric acid 5.2, sed rate 67, RF <14  Component     Latest Ref Rng & Units 05/16/2018  ANA Titer 1     titer 1:40 (H)  ANA Pattern 1      Nuclear, Homogeneous (A)  ANA TITER     titer 1:160 (H)  ANA PATTERN      Nuclear, Speckled (A)  Uric Acid, Serum     2.5 - 7.0 mg/dL 5.2  Anti Nuclear Antibody (ANA)     NEGATIVE POSITIVE (A)  Sed Rate     0 - 30 mm/h 67 (H)  RA Latex Turbid.     <14 IU/mL <14  June 04, 2017  DXA showed T score of -2.6 in the left femoral region.  imaging: No results found.  Recent Labs: Lab Results  Component Value Date   WBC 8.3 07/29/2017   HGB 13.5 07/29/2017    PLT 305.0 07/29/2017   NA 142 07/29/2017   K 3.5 07/29/2017   CL 105 07/29/2017   CO2 28 07/29/2017   GLUCOSE 84 07/29/2017   BUN 13 07/29/2017   CREATININE 0.88 07/29/2017   BILITOT 0.6 07/29/2017   ALKPHOS 86 07/29/2017   AST 14 07/29/2017   ALT 13 07/29/2017   PROT 6.8 07/29/2017   ALBUMIN 4.0 07/29/2017   CALCIUM 9.4 07/29/2017   GFRAA >60 01/06/2016    Speciality Comments: No specialty comments available.  Procedures:  No procedures performed Allergies: Morphine and related, Prochlorperazine, Simvastatin, and Sulfa antibiotics   Assessment / Plan:     Visit Diagnoses: Polymyalgia rheumatica ([place]) -patient has been diagnosed with polymyalgia rheumatica and treated by different physicians over several years.  That makes the diagnosis difficult.  I reviewed her chart and found that she has had elevated sedimentation rate for at least 7 years.  Patient states she has been on prednisone since then.  She noticed remarkable improvement on prednisone.  She has been unable to taper prednisone below 5 mg.  She recently increased her prednisone to 7 mg due to increased pain.  She had no muscular weakness or tenderness on examination.  She had no difficulty getting up from the chair.  I detailed discussion with Ms. Lehnen and I did explain to her that we may not never be able to establish a true diagnosis for her.  Although as she has been on prednisone for so long and is unable to taper prednisone I would like to give a trial of methotrexate as a steroid sparing agent.  Indications side effects contraindications were discussed.  Plan: CK  High risk medication use - Plan: CBC with Differential/Platelet, COMPLETE METABOLIC PANEL WITH GFR, Urinalysis, Routine w reflex microscopic, Hepatitis B core antibody, IgM, Hepatitis C antibody, QuantiFERON-TB Gold Plus, Hepatitis B surface antigen, Serum protein electrophoresis with reflex, IgG, IgA, IgM, HIV Antibody (routine testing w rflx)   Pain in  both hands -she has DIP and PIP thickening but no synovitis.  Plan: XR Hand 2 View Right, XR Hand 2 View Left, Cyclic citrul peptide antibody, IgG, rheumatoid factor was negative.  Primary osteoarthritis of both feet -clinical findings are consistent with osteoarthritis.  I do not see any synovitis.  Although prednisone could be masking her symptoms.  Primary osteoarthritis of right hip -patient's chronic pain.  DDD (degenerative disc disease), lumbar -she is followed by Dr. Inda Merlin.  She has had epidural injections in her lumbar spine in the past.  Positive ANA (antinuclear antibody) - 05/16/18: ANA 1:40 NH, 1:160 NS, uric acid 5.2, sed rate 67, RF <14 -she has no clinical features of lupus.  Current chronic use of systemic steroids -she has been on steroid therapy for many years at least 7 years or longer.  Age-related osteoporosis without current pathological fracture -I reviewed her last bone density from last year.  There was no comparison available.  She has been on Fosamax which she has been tolerating well.  Vitamin D deficiency - Plan: She is on vitamin D supplement.  Other medical problems are listed as follows:  Coronary artery disease involving native coronary artery of native heart without angina pectoris   Lower esophageal ring ([place])   Adenomatous polyp of colon, unspecified part of colon   History of hypothyroidism   History of gastroesophageal reflux (GERD)  History of depression     Orders: Orders Placed This Encounter  Procedures  . XR Hand 2 View Right  . XR Hand 2 View Left  . CBC with Differential/Platelet  . COMPLETE METABOLIC PANEL WITH GFR  . Urinalysis, Routine w reflex microscopic  . CK  . Cyclic citrul peptide antibody, IgG  . Hepatitis B core antibody, IgM  . Hepatitis C antibody  . QuantiFERON-TB Gold Plus  . Hepatitis B surface antigen  . Serum protein electrophoresis with reflex  . IgG, IgA, IgM  . HIV Antibody (routine testing w rflx)    No orders of the defined types were placed in this encounter.    Follow-Up Instructions: Return for PMR, OA, OP.   Bo Merino, MD  Note - This record has been created using Editor, commissioning.  Chart creation errors have been sought, but may not always  have been located. Such creation errors do not reflect on  the standard of medical care.

## 2018-07-31 ENCOUNTER — Ambulatory Visit (INDEPENDENT_AMBULATORY_CARE_PROVIDER_SITE_OTHER): Payer: PPO | Admitting: Family Medicine

## 2018-07-31 ENCOUNTER — Ambulatory Visit: Payer: Self-pay | Admitting: Family Medicine

## 2018-07-31 DIAGNOSIS — L0390 Cellulitis, unspecified: Secondary | ICD-10-CM

## 2018-07-31 MED ORDER — DOXYCYCLINE HYCLATE 100 MG PO TABS: 100.0000 mg | ORAL_TABLET | Freq: Two times a day (BID) | ORAL | 0 refills | Status: DC

## 2018-07-31 NOTE — Telephone Encounter (Signed)
Right thumb with redness and swelling yesterday that has worsened today. The redness has moved up her thumb to include the back of her hand towards her wrist and itchy. Feels numb and warm today  No injury/insect bite noted. 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 Transferred for virtual visit.

## 2018-07-31 NOTE — Progress Notes (Signed)
    Chief Complaint:  [name] is a 68 y.o. female who presents today for a virtual office visit with a chief complaint of right thumb pain.   Assessment/Plan:  Cellulitis No signs of systemic illness.  Possibly could be contact dermatitis as well however has no known exposures.  Start doxycycline 100 mg twice daily for 7 days.  Discussed reasons to return to care and seek emergent care.  Follow-up as needed.    Subjective:  HPI:  Right Thumb Pain  Started yesterday morning.  Located right thumb.  She was pulling weeds in her garden and thinks that she may have gotten infected by something then.  She thinks she has cellulitis. No obvious exposures.  Does not think that she was in contact with poison ivy or poison oak.  Area is very itchy and painful.  She has also had noticed some worsening redness today.  She has not tried anything for this.  No other obvious alleviating or aggravating factors.  ROS: Per HPI  PMH: She reports that she has never smoked. She has never used smokeless tobacco. She reports that she does not drink alcohol or use drugs.      Objective/Observations  Physical Exam: Gen: NAD, resting comfortably Pulm: Normal work of breathing Neuro: Grossly normal, moves all extremities Psych: Normal affect and thought content Skin: Right thumb with erythema and edema.  Cap refill intact.  Neurovascular intact distally.  Virtual Visit via Video   I connected with [name] on 07/31/18 at  3:40 PM EDT by a video enabled telemedicine application and verified that I am speaking with the correct person using two identifiers. I discussed the limitations of evaluation and management by telemedicine and the availability of in person appointments. The patient expressed understanding and agreed to proceed.   Patient location: Home Provider location: [place] participating in the virtual visit: Myself and Patient     Algis Greenhouse. Jerline Pain, MD  07/31/2018 3:45 PM

## 2018-08-04 ENCOUNTER — Encounter: Payer: Self-pay | Admitting: Family Medicine

## 2018-08-12 ENCOUNTER — Encounter: Payer: Self-pay | Admitting: Rheumatology

## 2018-08-12 ENCOUNTER — Ambulatory Visit: Payer: PPO | Admitting: Rheumatology

## 2018-08-12 ENCOUNTER — Ambulatory Visit: Payer: Self-pay

## 2018-08-12 ENCOUNTER — Other Ambulatory Visit: Payer: Self-pay

## 2018-08-12 VITALS — BP 143/88 | HR 75 | Resp 13 | Ht 61.0 in | Wt 165.0 lb

## 2018-08-12 DIAGNOSIS — M810 Age-related osteoporosis without current pathological fracture: Secondary | ICD-10-CM

## 2018-08-12 DIAGNOSIS — K222 Esophageal obstruction: Secondary | ICD-10-CM

## 2018-08-12 DIAGNOSIS — M5136 Other intervertebral disc degeneration, lumbar region: Secondary | ICD-10-CM

## 2018-08-12 DIAGNOSIS — M1611 Unilateral primary osteoarthritis, right hip: Secondary | ICD-10-CM | POA: Diagnosis not present

## 2018-08-12 DIAGNOSIS — Z8659 Personal history of other mental and behavioral disorders: Secondary | ICD-10-CM

## 2018-08-12 DIAGNOSIS — I2510 Atherosclerotic heart disease of native coronary artery without angina pectoris: Secondary | ICD-10-CM | POA: Diagnosis not present

## 2018-08-12 DIAGNOSIS — M51369 Other intervertebral disc degeneration, lumbar region without mention of lumbar back pain or lower extremity pain: Secondary | ICD-10-CM

## 2018-08-12 DIAGNOSIS — M79641 Pain in right hand: Secondary | ICD-10-CM

## 2018-08-12 DIAGNOSIS — Z7952 Long term (current) use of systemic steroids: Secondary | ICD-10-CM | POA: Diagnosis not present

## 2018-08-12 DIAGNOSIS — D126 Benign neoplasm of colon, unspecified: Secondary | ICD-10-CM

## 2018-08-12 DIAGNOSIS — M353 Polymyalgia rheumatica: Secondary | ICD-10-CM

## 2018-08-12 DIAGNOSIS — R768 Other specified abnormal immunological findings in serum: Secondary | ICD-10-CM

## 2018-08-12 DIAGNOSIS — Z8719 Personal history of other diseases of the digestive system: Secondary | ICD-10-CM

## 2018-08-12 DIAGNOSIS — M79642 Pain in left hand: Secondary | ICD-10-CM

## 2018-08-12 DIAGNOSIS — Z79899 Other long term (current) drug therapy: Secondary | ICD-10-CM | POA: Diagnosis not present

## 2018-08-12 DIAGNOSIS — M19071 Primary osteoarthritis, right ankle and foot: Secondary | ICD-10-CM | POA: Diagnosis not present

## 2018-08-12 DIAGNOSIS — Z8639 Personal history of other endocrine, nutritional and metabolic disease: Secondary | ICD-10-CM

## 2018-08-12 DIAGNOSIS — E559 Vitamin D deficiency, unspecified: Secondary | ICD-10-CM

## 2018-08-12 DIAGNOSIS — M19072 Primary osteoarthritis, left ankle and foot: Secondary | ICD-10-CM

## 2018-08-12 NOTE — Patient Instructions (Signed)
Methotrexate tablets What is this medicine? METHOTREXATE (METH oh TREX ate) is a chemotherapy drug used to treat cancer including breast cancer, leukemia, and lymphoma. This medicine can also be used to treat psoriasis and certain kinds of arthritis. This medicine may be used for other purposes; ask your health care provider or pharmacist if you have questions. COMMON BRAND NAME(S): Rheumatrex, Trexall What should I tell my health care provider before I take this medicine? They need to know if you have any of these conditions:  fluid in the stomach area or lungs  if you often drink alcohol  infection or immune system problems  kidney disease or on hemodialysis  liver disease  low blood counts, like low white cell, platelet, or red cell counts  lung disease  radiation therapy  stomach ulcers  ulcerative colitis  an unusual or allergic reaction to methotrexate, other medicines, foods, dyes, or preservatives  pregnant or trying to get pregnant  breast-feeding How should I use this medicine? Take this medicine by mouth with a glass of water. Follow the directions on the prescription label. Take your medicine at regular intervals. Do not take it more often than directed. Do not stop taking except on your doctor's advice. Make sure you know why you are taking this medicine and how often you should take it. If this medicine is used for a condition that is not cancer, like arthritis or psoriasis, it should be taken weekly, NOT daily. Taking this medicine more often than directed can cause serious side effects, even death. Talk to your healthcare provider about safe handling and disposal of this medicine. You may need to take special precautions. Talk to your pediatrician regarding the use of this medicine in children. While this drug may be prescribed for selected conditions, precautions do apply. Overdosage: If you think you have taken too much of this medicine contact a poison control  center or emergency room at once. NOTE: This medicine is only for you. Do not share this medicine with others. What if I miss a dose? If you miss a dose, talk with your doctor or health care professional. Do not take double or extra doses. What may interact with this medicine? This medicine may interact with the following medication:  acitretin  aspirin and aspirin-like medicines including salicylates  azathioprine  certain antibiotics like penicillins, tetracycline, and chloramphenicol  cyclosporine  gold  hydroxychloroquine  live virus vaccines  NSAIDs, medicines for pain and inflammation, like ibuprofen or naproxen  other cytotoxic agents  penicillamine  phenylbutazone  phenytoin  probenecid  retinoids such as isotretinoin and tretinoin  steroid medicines like prednisone or cortisone  sulfonamides like sulfasalazine and trimethoprim/sulfamethoxazole  theophylline This list may not describe all possible interactions. Give your health care provider a list of all the medicines, herbs, non-prescription drugs, or dietary supplements you use. Also tell them if you smoke, drink alcohol, or use illegal drugs. Some items may interact with your medicine. What should I watch for while using this medicine? Avoid alcoholic drinks. This medicine can make you more sensitive to the sun. Keep out of the sun. If you cannot avoid being in the sun, wear protective clothing and use sunscreen. Do not use sun lamps or tanning beds/booths. You may need blood work done while you are taking this medicine. Call your doctor or health care professional for advice if you get a fever, chills or sore throat, or other symptoms of a cold or flu. Do not treat yourself. This drug decreases your   body's ability to fight infections. Try to avoid being around people who are sick. This medicine may increase your risk to bruise or bleed. Call your doctor or health care professional if you notice any unusual  bleeding. Check with your doctor or health care professional if you get an attack of severe diarrhea, nausea and vomiting, or if you sweat a lot. The loss of too much body fluid can make it dangerous for you to take this medicine. Talk to your doctor about your risk of cancer. You may be more at risk for certain types of cancers if you take this medicine. Both men and women must use effective birth control with this medicine. Do not become pregnant while taking this medicine or until at least 1 normal menstrual cycle has occurred after stopping it. Women should inform their doctor if they wish to become pregnant or think they might be pregnant. Men should not father a child while taking this medicine and for 3 months after stopping it. There is a potential for serious side effects to an unborn child. Talk to your health care professional or pharmacist for more information. Do not breast-feed an infant while taking this medicine. What side effects may I notice from receiving this medicine? Side effects that you should report to your doctor or health care professional as soon as possible:  allergic reactions like skin rash, itching or hives, swelling of the face, lips, or tongue  breathing problems or shortness of breath  diarrhea  dry, nonproductive cough  low blood counts - this medicine may decrease the number of white blood cells, red blood cells and platelets. You may be at increased risk for infections and bleeding.  mouth sores  redness, blistering, peeling or loosening of the skin, including inside the mouth  signs of infection - fever or chills, cough, sore throat, pain or trouble passing urine  signs and symptoms of bleeding such as bloody or black, tarry stools; red or dark-brown urine; spitting up blood or brown material that looks like coffee grounds; red spots on the skin; unusual bruising or bleeding from the eye, gums, or nose  signs and symptoms of kidney injury like trouble  passing urine or change in the amount of urine  signs and symptoms of liver injury like dark yellow or brown urine; general ill feeling or flu-like symptoms; light-colored stools; loss of appetite; nausea; right upper belly pain; unusually weak or tired; yellowing of the eyes or skin Side effects that usually do not require medical attention (report to your doctor or health care professional if they continue or are bothersome):  dizziness  hair loss  tiredness  upset stomach  vomiting This list may not describe all possible side effects. Call your doctor for medical advice about side effects. You may report side effects to FDA at 1-800-FDA-1088. Where should I keep my medicine? Keep out of the reach of children. Store at room temperature between 20 and 25 degrees C (68 and 77 degrees F). Protect from light. Throw away any unused medicine after the expiration date. NOTE: This sheet is a summary. It may not cover all possible information. If you have questions about this medicine, talk to your doctor, pharmacist, or health care provider.  2020 Elsevier/Gold Standard (2016-09-20 13:38:43)  

## 2018-08-13 NOTE — Progress Notes (Signed)
UA is negative.  Other labs are pending at this time.

## 2018-08-14 LAB — CBC WITH DIFFERENTIAL/PLATELET
Absolute Monocytes: 460 cells/uL (ref 200–950)
Basophils Absolute: 37 cells/uL (ref 0–200)
Basophils Relative: 0.5 %
Eosinophils Absolute: 110 cells/uL (ref 15–500)
Eosinophils Relative: 1.5 %
HCT: 43 % (ref 35.0–45.0)
Hemoglobin: 14.1 g/dL (ref 11.7–15.5)
Lymphs Abs: 934 cells/uL (ref 850–3900)
MCH: 29.2 pg (ref 27.0–33.0)
MCHC: 32.8 g/dL (ref 32.0–36.0)
MCV: 89 fL (ref 80.0–100.0)
MPV: 11.1 fL (ref 7.5–12.5)
Monocytes Relative: 6.3 %
Neutro Abs: 5760 cells/uL (ref 1500–7800)
Neutrophils Relative %: 78.9 %
Platelets: 282 10*3/uL (ref 140–400)
RBC: 4.83 10*6/uL (ref 3.80–5.10)
RDW: 13.7 % (ref 11.0–15.0)
Total Lymphocyte: 12.8 %
WBC: 7.3 10*3/uL (ref 3.8–10.8)

## 2018-08-14 LAB — HEPATITIS B SURFACE ANTIGEN: Hepatitis B Surface Ag: NONREACTIVE

## 2018-08-14 LAB — COMPLETE METABOLIC PANEL WITH GFR
AG Ratio: 1.7 (calc) (ref 1.0–2.5)
ALT: 13 U/L (ref 6–29)
AST: 14 U/L (ref 10–35)
Albumin: 3.9 g/dL (ref 3.6–5.1)
Alkaline phosphatase (APISO): 90 U/L (ref 37–153)
BUN: 15 mg/dL (ref 7–25)
CO2: 30 mmol/L (ref 20–32)
Calcium: 9.6 mg/dL (ref 8.6–10.4)
Chloride: 105 mmol/L (ref 98–110)
Creat: 0.85 mg/dL (ref 0.50–0.99)
GFR, Est African American: 82 mL/min/{1.73_m2} (ref >=60)
GFR, Est Non African American: 70 mL/min/{1.73_m2} (ref >=60)
Globulin: 2.3 g/dL (calc) (ref 1.9–3.7)
Glucose, Bld: 90 mg/dL (ref 65–99)
Potassium: 4.3 mmol/L (ref 3.5–5.3)
Sodium: 141 mmol/L (ref 135–146)
Total Bilirubin: 0.5 mg/dL (ref 0.2–1.2)
Total Protein: 6.2 g/dL (ref 6.1–8.1)

## 2018-08-14 LAB — PROTEIN ELECTROPHORESIS, SERUM, WITH REFLEX
Albumin ELP: 3.6 g/dL — ABNORMAL LOW (ref 3.8–4.8)
Alpha 1: 0.3 g/dL (ref 0.2–0.3)
Alpha 2: 0.8 g/dL (ref 0.5–0.9)
Beta 2: 0.3 g/dL (ref 0.2–0.5)
Beta Globulin: 0.4 g/dL (ref 0.4–0.6)
Gamma Globulin: 0.7 g/dL — ABNORMAL LOW (ref 0.8–1.7)
Total Protein: 6.1 g/dL (ref 6.1–8.1)

## 2018-08-14 LAB — IGG, IGA, IGM
IgG (Immunoglobin G), Serum: 800 mg/dL (ref 600–1540)
IgM, Serum: 66 mg/dL (ref 50–300)
Immunoglobulin A: 143 mg/dL (ref 70–320)

## 2018-08-14 LAB — HEPATITIS B CORE ANTIBODY, IGM: Hep B C IgM: NONREACTIVE

## 2018-08-14 LAB — HEPATITIS C ANTIBODY
Hepatitis C Ab: NONREACTIVE
SIGNAL TO CUT-OFF: 0.02 (ref <1.00)

## 2018-08-14 LAB — URINALYSIS, ROUTINE W REFLEX MICROSCOPIC
Bacteria, UA: NONE SEEN /HPF
Bilirubin Urine: NEGATIVE
Glucose, UA: NEGATIVE
Hyaline Cast: NONE SEEN /LPF
Ketones, ur: NEGATIVE
Leukocytes,Ua: NEGATIVE
Nitrite: NEGATIVE
Protein, ur: NEGATIVE
Specific Gravity, Urine: 1.019 (ref 1.001–1.03)
Squamous Epithelial / HPF: NONE SEEN /HPF (ref <=5)
WBC, UA: NONE SEEN /HPF (ref 0–5)
pH: <=5 (ref 5.0–8.0)

## 2018-08-14 LAB — QUANTIFERON-TB GOLD PLUS
Mitogen-NIL: >10 IU/mL
NIL: 0.02 IU/mL
QuantiFERON-TB Gold Plus: NEGATIVE
TB1-NIL: 0.02 IU/mL
TB2-NIL: 0.02 IU/mL

## 2018-08-14 LAB — HIV ANTIBODY (ROUTINE TESTING W REFLEX): HIV 1&2 Ab, 4th Generation: NONREACTIVE

## 2018-08-14 LAB — CYCLIC CITRUL PEPTIDE ANTIBODY, IGG: Cyclic Citrullin Peptide Ab: <16 UNITS

## 2018-08-14 LAB — CK: Total CK: 54 U/L (ref 29–143)

## 2018-08-18 ENCOUNTER — Telehealth: Payer: Self-pay | Admitting: *Deleted

## 2018-08-18 NOTE — Telephone Encounter (Signed)
A message was left, re: follow up visit. 

## 2018-08-18 NOTE — Progress Notes (Signed)
I will discuss labs at the follow-up visit.

## 2018-08-19 ENCOUNTER — Ambulatory Visit: Payer: PPO | Admitting: Rheumatology

## 2018-08-27 ENCOUNTER — Telehealth: Payer: Self-pay

## 2018-08-27 DIAGNOSIS — M353 Polymyalgia rheumatica: Secondary | ICD-10-CM

## 2018-08-27 DIAGNOSIS — M160 Bilateral primary osteoarthritis of hip: Secondary | ICD-10-CM

## 2018-08-27 NOTE — Telephone Encounter (Signed)
Copied from [place] [id number]. Topic: General - Other >> Aug 26, 2018  5:14 PM Keene Breath wrote: Reason for CRM: Patient called to request that the doctor call her regarding her arthritis.  CB# [phone].  Please advise

## 2018-08-28 ENCOUNTER — Telehealth: Payer: Self-pay

## 2018-08-28 ENCOUNTER — Telehealth: Payer: Self-pay | Admitting: Family Medicine

## 2018-08-28 ENCOUNTER — Encounter: Payer: Self-pay | Admitting: Family Medicine

## 2018-08-28 NOTE — Telephone Encounter (Signed)
This encounter was created in error - please disregard.

## 2018-08-28 NOTE — Telephone Encounter (Signed)
Pt states she has been trying to get referral to Dr. Elpidio Galea. TN reviewed note stating referral has been placed. Also requesting refill of Prednisone. States she was to taper dose "But once I get below 7mg  I can't walk." States she ran out of med today. States pharmacy called in refill request, TN could not find request.   After hours call; assured pt TN would route to practice for Dr. Ronney Lion review.  CB# [phone]

## 2018-08-28 NOTE — Telephone Encounter (Signed)
Copied from [place] [id number]. Topic: Referral - Request for Referral >> Aug 28, 2018  9:53 AM Rainey Pines A wrote:  Has patient seen PCP for this complaint? Yes *If NO, is insurance requiring patient see PCP for this issue before PCP can refer them? Referral for which specialty:Rheumatology Preferred provider/office:Dr. Elpidio Galea  Reason for referral: Arthiritus

## 2018-08-28 NOTE — Addendum Note (Signed)
Addended by: Jasper Loser on: 08/28/2018 01:38 PM   Modules accepted: Orders

## 2018-08-28 NOTE — Telephone Encounter (Addendum)
Called pt and left VM to call the office.     Copied from [place] [id number]. Topic: Referral - Request for Referral >> Aug 28, 2018  9:53 AM Rainey Pines A wrote:  Has patient seen PCP for this complaint? Yes *If NO, is insurance requiring patient see PCP for this issue before PCP can refer them? Referral for which specialty:Rheumatology Preferred provider/office:Dr. Elpidio Galea  Reason for referral: Arthiritus

## 2018-08-28 NOTE — Telephone Encounter (Signed)
Referral has been placed. Pt returned call and was advised.

## 2018-08-29 ENCOUNTER — Telehealth: Payer: Self-pay | Admitting: Orthopaedic Surgery

## 2018-08-29 ENCOUNTER — Telehealth: Payer: Self-pay | Admitting: Family Medicine

## 2018-08-29 MED ORDER — PREDNISONE 5 MG PO TABS: ORAL_TABLET | ORAL | 0 refills | Status: DC

## 2018-08-29 MED ORDER — PREDNISONE 1 MG PO TABS: ORAL_TABLET | ORAL | 0 refills | Status: DC

## 2018-08-29 NOTE — Telephone Encounter (Signed)
Pt advised yesterday that referral has been placed. Forwarding refill request to Dr. Yong Channel.

## 2018-08-29 NOTE — Addendum Note (Signed)
Addended by: Jasper Loser on: 08/29/2018 09:25 AM   Modules accepted: Orders

## 2018-08-29 NOTE — Telephone Encounter (Signed)
Patient called needing Rx refilled (Prednisone)  Patient uses [facility] on [address] The number to contact patient is [phone]

## 2018-08-29 NOTE — Telephone Encounter (Signed)
I called patient and discussed.  Dr. Yong Channel put her on a prednisone wean since she has been on it for 3 years and has osteoporosis.  Dr. Corena Pilgrim ordered multiple labs and has follow-up appointment coming up with her.  As she weaned down the prednisone she states she is having trouble walking and hurting more.  I discussed with her this is as expected she needs to talk with Dr. Yong Channel needs to continue gradually with the wean and I explained to her that her adrenal glands will kick in and start making cortisol again.  She will call his office to discuss.  FYI

## 2018-08-29 NOTE — Telephone Encounter (Signed)
Please advise. Thanks.  

## 2018-08-29 NOTE — Telephone Encounter (Signed)
May send in 5 mg tablets #30 for daily use  May also send in 1 mg tablets to take 3 daily #90 (3 mg per day) in addition to 5 mg tablet to get her to 8 mg.   Looks like has rheum visit 09/11/2018

## 2018-08-29 NOTE — Telephone Encounter (Signed)
Pt would like a refill on prednisone for her arthiritis . Pt is waiting on rheumatologist referral. [facility]

## 2018-08-29 NOTE — Telephone Encounter (Signed)
Pt called again and says that she needs rx for Prednisone taking at least 8 mg a day until she is able to see rheumatology. If she goes below 8 mg daily, she cannot walk d/t the pain and inflammation.

## 2018-08-29 NOTE — Telephone Encounter (Signed)
Forwarding to Dr. Hunter.  

## 2018-08-29 NOTE — Telephone Encounter (Signed)
Rx's sent to [facility]. Called pt and left VM to call the office.

## 2018-08-29 NOTE — Addendum Note (Signed)
Addended by: Jasper Loser on: 08/29/2018 03:17 PM   Modules accepted: Orders

## 2018-09-01 ENCOUNTER — Telehealth: Payer: Self-pay | Admitting: Rheumatology

## 2018-09-01 NOTE — Telephone Encounter (Signed)
FYI

## 2018-09-01 NOTE — Telephone Encounter (Addendum)
attemtped to contact patient and left message on machine to advise patient to contact the office.

## 2018-09-01 NOTE — Progress Notes (Signed)
Office Visit Note  Patient: [name]             Date of Birth: 1950-02-21           MRN: [id number]             PCP: [name], MD Referring: [name], MD Visit Date: 09/11/2018 Occupation: @GUAROCC @  Subjective:  Medication monitoring.   History of Present Illness: [name] is a 68 y.o. female with presumptive history of polymyalgia rheumatica.  She also has history of osteoporosis.  She is unable to taper prednisone below 7 mg.  She has been on prednisone by different physicians over the last 7 years.  She states when she lowers her dose she starts aching in her muscles and joints.  She has not noticed joint swelling.  She also has difficulty getting on the chair when she is on the lower dose of the prednisone.  She has been tolerating Fosamax well without any side effects.  Activities of Daily Living:  Patient reports morning stiffness for 30-60 minutes.   Patient Denies nocturnal pain.  Difficulty dressing/grooming: Denies Difficulty climbing stairs: Denies Difficulty getting out of chair: Denies Difficulty using hands for taps, buttons, cutlery, and/or writing: Denies  Review of Systems  Constitutional: Positive for fatigue. Negative for night sweats, weight gain and weight loss.  HENT: Negative for mouth sores, trouble swallowing, trouble swallowing, mouth dryness and nose dryness.   Eyes: Negative for pain, redness, itching, visual disturbance and dryness.  Respiratory: Negative for cough, shortness of breath, wheezing and difficulty breathing.   Cardiovascular: Negative for chest pain, palpitations, hypertension, irregular heartbeat and swelling in legs/feet.  Gastrointestinal: Negative for abdominal pain, blood in stool, constipation and diarrhea.  Endocrine: Negative for increased urination.  Genitourinary: Negative for painful urination and vaginal dryness.  Musculoskeletal: Positive for arthralgias, joint pain, myalgias, morning stiffness  and myalgias. Negative for joint swelling, muscle weakness and muscle tenderness.  Skin: Negative for color change, rash, hair loss, redness, skin tightness, ulcers and sensitivity to sunlight.  Allergic/Immunologic: Negative for susceptible to infections.  Neurological: Negative for dizziness, numbness, headaches, memory loss, night sweats and weakness.  Hematological: Negative for bruising/bleeding tendency and swollen glands.  Psychiatric/Behavioral: Positive for sleep disturbance. Negative for depressed mood and confusion. The patient is not nervous/anxious.     PMFS History:  Patient Active Problem List   Diagnosis Date Noted  . Recurrent major depressive disorder, in full remission ([place]) 09/03/2018  . Lower esophageal ring ([place]) 03/03/2018  . Osteoarthritis of right hip 12/02/2017  . GERD (gastroesophageal reflux disease) 12/15/2015  . Polyp of colon, adenomatous 10/11/2014  . Vitamin D deficiency 05/26/2014  . Osteoporosis 05/26/2014  . Polymyalgia rheumatica ([place]) 12/01/2010  . CAD (coronary artery disease) 09/09/2007  . Hematuria 09/09/2007  . Depression 03/20/2007  . Hypothyroid 02/13/1999    Past Medical History:  Diagnosis Date  . Anxiety   . Arthritis   . CAD (coronary artery disease)   . Cataract   . Depression   . DISORDER, MENOPAUSAL NOS 03/20/2007   Qualifier: Diagnosis of  By: Arnoldo Morale MD, Balinda Quails   . GERD (gastroesophageal reflux disease)   . Hyperlipidemia   . Hypothyroidism   . MYOCARDIAL INFARCTION, HX OF 09/09/2007   Qualifier: Diagnosis of  By: Leanne Chang MD, Bruce    . Osteoporosis   . Polymyalgia rheumatica (HCC)     Family History  Problem Relation Age of Onset  . Hypertension Father   .  Heart disease Father        Mi age 56, 28, 37 and 3.  . Cancer Father   . Hyperlipidemia Father   . Colon cancer Maternal Uncle   . Hypertension Mother   . Cancer Mother   . Hyperlipidemia Mother   . Pulmonary fibrosis Sister   . Epilepsy Son   . Healthy  Son   . Healthy Son   . Rheum arthritis Niece   . Lupus Niece   . Rheum arthritis Niece    Past Surgical History:  Procedure Laterality Date  . CATARACT EXTRACTION    . CESAREAN SECTION    . CHOLECYSTECTOMY N/A 11/24/2012   Procedure: LAPAROSCOPIC CHOLECYSTECTOMY;  Surgeon: Ralene Ok, MD;  Location: [facility];  Service: General;  Laterality: N/A;  . COSMETIC SURGERY    . EYE SURGERY     eye lids lifted  . PTCA     Social History   Social History Narrative   Husband and 2 adult children live at home. No grandkids. Son with epilepsy.       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 No part time.    Immunization History  Administered Date(s) Administered  . Influenza Whole 11/09/2008  . Influenza, High Dose Seasonal PF 10/30/2016, 12/02/2017  . Influenza-Unspecified 11/24/2015  . Pneumococcal Conjugate-13 10/11/2014  . Pneumococcal Polysaccharide-23 12/15/2015  . Td 02/12/2006  . Tdap 04/26/2017  . Zoster 09/19/2010     Objective: Vital Signs: BP 130/87 (BP Location: Left Arm, Patient Position: Sitting, Cuff Size: Normal)   Pulse 77   Resp 12   Ht 5' 1.5" (1.562 m)   Wt 167 lb (75.8 kg)   BMI 31.04 kg/m    Physical Exam Vitals signs and nursing note reviewed.  Constitutional:      Appearance: She is well-developed.  HENT:     Head: Normocephalic and atraumatic.  Eyes:     Conjunctiva/sclera: Conjunctivae normal.  Neck:     Musculoskeletal: Normal range of motion.  Cardiovascular:     Rate and Rhythm: Normal rate and regular rhythm.     Heart sounds: Normal heart sounds.  Pulmonary:     Effort: Pulmonary effort is normal.     Breath sounds: Normal breath sounds.  Abdominal:     General: Bowel sounds are normal.     Palpations: Abdomen is soft.  Lymphadenopathy:     Cervical: No cervical adenopathy.  Skin:    General: Skin is warm and dry.     Capillary Refill: Capillary refill takes less than 2 seconds.  Neurological:     Mental Status: She is alert  and oriented to person, place, and time.  Psychiatric:        Behavior: Behavior normal.      Musculoskeletal Exam: C-spine, thoracic and lumbar spine with good range of motion.  Shoulder joints, elbow joints, wrist joints, MCPs PIPs DIPs with good range of motion with no synovitis.  Hip joints knee joints ankles MTPs PIPs DIPs with good range of motion with no synovitis.  She had no muscular weakness or tenderness on examination.  CDAI Exam: CDAI Score: - Patient Global: -; Provider Global: - Swollen: -; Tender: - Joint Exam   No joint exam has been documented for this visit   There is currently no information documented on the homunculus. Go to the Rheumatology activity and complete the homunculus joint exam.  Investigation: No additional findings.  Imaging: No results found.  Recent Labs: Lab Results  Component  Value Date   WBC 7.3 08/12/2018   HGB 14.1 08/12/2018   PLT 282 08/12/2018   NA 141 08/12/2018   K 4.3 08/12/2018   CL 105 08/12/2018   CO2 30 08/12/2018   GLUCOSE 90 08/12/2018   BUN 15 08/12/2018   CREATININE 0.85 08/12/2018   BILITOT 0.5 08/12/2018   ALKPHOS 86 07/29/2017   AST 14 08/12/2018   ALT 13 08/12/2018   PROT 6.2 08/12/2018   PROT 6.1 08/12/2018   ALBUMIN 4.0 07/29/2017   CALCIUM 9.6 08/12/2018   GFRAA 82 08/12/2018   QFTBGOLDPLUS NEGATIVE 08/12/2018   UA negative, TB Gold negative, immunoglobulins normal, hepatitis B-, hepatitis C negative, HIV negative, CK 54, anti-CCP negative, SPEP showed potential early nephrotic pattern , UPEP recommended Speciality Comments: No specialty comments available.  Procedures:  No procedures performed Allergies: Morphine and related, Prochlorperazine, Simvastatin, and Sulfa antibiotics   Assessment / Plan:     Visit Diagnoses: Polymyalgia rheumatica ([place]) -patient was diagnosed with polymyalgia rheumatica 7 years ago by a physician.  She has been under care of different physicians since then and had been  on prednisone.  She is unable to taper prednisone below 7 mg.  She states tapering prednisone causes increased muscle pain and weakness.  She also experiences joint pain but she does not recall joint swelling.  We had detailed discussion last visit about adding methotrexate to see if it will help [place] to taper prednisone.  She was in agreement.  All her labs have been normal.  The plan is to start her on methotrexate 4 tablets p.o. weekly along with folic acid 1 mg p.o. daily.  We will check labs in 2 weeks, 2 months and then every 3 months.  My plan is to start tapering prednisone by 0.5 mg every 2 months until she reaches 4 mg p.o. daily.  At that time I would like to do a.m. serum cortisol level to look for adrenal suppression.  She may not be able to come off prednisone completely as she has taken prednisone for so many years.  High risk medication use -plan to start her on methotrexate today.  Long term (current) use of systemic steroids -she has been on prednisone for 7 years.  Primary osteoarthritis of both hands -she has minimal arthritis.  Primary osteoarthritis of both feet -mild clinical osteoarthritis.  Primary osteoarthritis of right hip -she has good range of motion.  DDD (degenerative disc disease), lumbar -she currently does not have much discomfort.  Age-related osteoporosis without current pathological fracture - She is taking Fosamax 70 mg every 7 days prescribed by her PCP Dr. Yong Channel.  Most recent DEXA on 06/04/2017 showed T score of -2.6 at total left hip and a -5% change at the left femur neck.  Vitamin D deficiency -she is on vitamin D supplement.  Other medical problems are listed as follows:  Coronary artery disease involving native coronary artery of native heart without angina pectoris   History of gastroesophageal reflux (GERD)  History of depression   History of hypothyroidism   Adenomatous polyp of colon, unspecified part of colon   Orders: Orders Placed This  Encounter  Procedures  . CBC with Differential/Platelet  . COMPLETE METABOLIC PANEL WITH GFR   Meds ordered this encounter  Medications  . methotrexate (RHEUMATREX) 2.5 MG tablet    Sig: Take 4 tablets (10 mg) once a week on Friday evenings. Caution:Chemotherapy. Protect from light.    Dispense:  48 tablet    Refill:  0  . folic acid (FOLVITE) 1 MG tablet    Sig: Take 1 tablet (1 mg total) by mouth daily.    Dispense:  90 tablet    Refill:  0  . predniSONE (DELTASONE) 5 MG tablet    Sig: Take 1 tablet daily along with two 1 mg dose for a total daily dose of 7 mg for 1 month.    Dispense:  30 tablet    Refill:  0  . predniSONE (DELTASONE) 1 MG tablet    Sig: Take 2 tablets (2 mg) along with 5 mg tablet to equal daily dose of 7 mg for 1 month.    Dispense:  60 tablet    Refill:  0    Face-to-face time spent with patient was 30 minutes. Greater than 50% of time was spent in counseling and coordination of care.  Follow-Up Instructions: Return in about 3 months (around 12/12/2018) for PMR, OP.   Bo Merino, MD  Note - This record has been created using Editor, commissioning.  Chart creation errors have been sought, but may not always  have been located. Such creation errors do not reflect on  the standard of medical care.

## 2018-09-01 NOTE — Telephone Encounter (Signed)
Based on the recommendations please order UPEP for the patient and notify patient that SPEP showed some protein abnormality and UPEP was recommended by the pathologist. Bo Merino, MD

## 2018-09-01 NOTE — Telephone Encounter (Signed)
noted 

## 2018-09-02 ENCOUNTER — Other Ambulatory Visit: Payer: Self-pay

## 2018-09-02 ENCOUNTER — Telehealth: Payer: Self-pay | Admitting: Family Medicine

## 2018-09-02 DIAGNOSIS — R778 Other specified abnormalities of plasma proteins: Secondary | ICD-10-CM

## 2018-09-02 NOTE — Telephone Encounter (Signed)
Spoke to pt about her rheumatology referral.  She also needs an appt asap with Dr Yong Channel so she can get her Prednisone refilled.  I explained that he is full this week and out of the office all next week and there may not be anything we can do.  She is willing to do a virtual visit if that is possible.  Pt would like a call back.

## 2018-09-02 NOTE — Telephone Encounter (Signed)
Looks like pt is scheduled to see rheumatology 09/11/18. Prednisone was refilled for 30 day supply on 08/29/18.   Called pt and left VM to call the office.

## 2018-09-02 NOTE — Telephone Encounter (Signed)
Explained the information below to patient and she verbalized understanding. patient will come to the office tomorrow for the UPEP. Provided patient with lab hours and order has been placed.

## 2018-09-03 ENCOUNTER — Ambulatory Visit (INDEPENDENT_AMBULATORY_CARE_PROVIDER_SITE_OTHER): Payer: PPO | Admitting: Family Medicine

## 2018-09-03 ENCOUNTER — Other Ambulatory Visit: Payer: Self-pay

## 2018-09-03 ENCOUNTER — Encounter: Payer: Self-pay | Admitting: Family Medicine

## 2018-09-03 VITALS — Ht 61.0 in | Wt 162.0 lb

## 2018-09-03 DIAGNOSIS — F3342 Major depressive disorder, recurrent, in full remission: Secondary | ICD-10-CM | POA: Insufficient documentation

## 2018-09-03 DIAGNOSIS — R778 Other specified abnormalities of plasma proteins: Secondary | ICD-10-CM | POA: Diagnosis not present

## 2018-09-03 DIAGNOSIS — M353 Polymyalgia rheumatica: Secondary | ICD-10-CM

## 2018-09-03 NOTE — Telephone Encounter (Signed)
Called pt and left VM to call the office. Can try to work in today at 11:40.

## 2018-09-03 NOTE — Addendum Note (Signed)
Addended by: [name] on: 09/03/2018 02:11 PM   Modules accepted: Level of Service

## 2018-09-03 NOTE — Patient Instructions (Addendum)
There are no preventive care reminders to display for this patient.  Depression screen [facility] 2/9 09/03/2018 07/29/2017 07/29/2017  Decreased Interest 0 0 0  Down, Depressed, Hopeless 0 0 0  PHQ - 2 Score 0 0 0  Altered sleeping 0 0 -  Tired, decreased energy 1 1 -  Change in appetite 0 0 -  Feeling bad or failure about yourself  0 0 -  Trouble concentrating 0 0 -  Moving slowly or fidgety/restless 0 0 -  Suicidal thoughts 0 0 -  PHQ-9 Score 1 1 -  Difficult doing work/chores Not difficult at all Somewhat difficult -

## 2018-09-03 NOTE — Telephone Encounter (Signed)
Spoke with patient, she would like to be worked in for a virtual visit with Dr. Yong Channel. She advised that he wanted her to set up a virtual visit with him before he left for vacation. I advised that his schedule is full but will check with him to see if he would be able to work her in for virtual visit.

## 2018-09-03 NOTE — Progress Notes (Addendum)
Phone [phone]   Subjective:  Virtual visit via Video note. Chief complaint: Chief Complaint  Patient presents with  . Consult    Prednisone   This visit type was conducted due to national recommendations for restrictions regarding the COVID-19 Pandemic (e.g. social distancing).  This format is felt to be most appropriate for this patient at this time balancing risks to patient and risks to population by having him in for in person visit.  No physical exam was performed (except for noted visual exam or audio findings with Telehealth visits).    Our team/I connected with Juanell Fairly at 11:40 AM EDT by a video enabled telemedicine application (doxy.me or caregility through epic) and verified that I am speaking with the correct person using two identifiers.  Location patient: [place] Location provider: [facility], office Persons participating in the virtual visit:  patient  Our team/I discussed the limitations of evaluation and management by telemedicine and the availability of in person appointments. In light of current covid-19 pandemic, patient also understands that we are trying to protect them by minimizing in office contact if at all possible.  The patient expressed consent for telemedicine visit and agreed to proceed. Patient understands insurance will be billed.   ROS- no fever/chills/nauseas/vomiting/cough/sore throat   Past Medical History-  Patient Active Problem List   Diagnosis Date Noted  . Polymyalgia rheumatica ([place]) 12/01/2010    Priority: High  . CAD (coronary artery disease) 09/09/2007    Priority: High  . Osteoarthritis of right hip 12/02/2017    Priority: Medium  . Osteoporosis 05/26/2014    Priority: Medium  . Depression 03/20/2007    Priority: Medium  . Hypothyroid 02/13/1999    Priority: Medium  . Polyp of colon, adenomatous 10/11/2014    Priority: Low  . Vitamin D deficiency 05/26/2014    Priority: Low  . Hematuria 09/09/2007    Priority: Low   . Recurrent major depressive disorder, in full remission ([place]) 09/03/2018  . Lower esophageal ring ([place]) 03/03/2018  . GERD (gastroesophageal reflux disease) 12/15/2015    Medications- reviewed and updated Current Outpatient Medications  Medication Sig Dispense Refill  . alendronate (FOSAMAX) 70 MG tablet TAKE 1 TABLET BY MOUTH EVERY 7 DAYS. TAKE ON EMPTY stomach WITH full GLASS OF WATER. stay upright. 12 tablet 2  . aspirin 81 MG EC tablet Take 81 mg by mouth daily.     . diazepam (VALIUM) 5 MG tablet Take as directed prior to procedure. 3 tablet 0  . Diclofenac Sodium (PENNSAID) 2 % SOLN Apply 2 g topically 2 (two) times daily as needed (to affected area). 112 g 3  . Evolocumab (REPATHA SURECLICK) 637 MG/ML SOAJ Inject 1 pen into the skin every 14 (fourteen) days. 2 pen 11  . HYDROcodone-acetaminophen (NORCO/VICODIN) 5-325 MG tablet Take 1 tablet by mouth every 6 (six) hours as needed for moderate pain. 30 tablet 0  . levothyroxine (SYNTHROID) 112 MCG tablet TAKE 1 TABLET BY MOUTH EVERY DAY 90 tablet 3  . pantoprazole (PROTONIX) 40 MG tablet Take 1 tablet (40 mg total) by mouth daily. 90 tablet 3  . PARoxetine (PAXIL) 20 MG tablet TAKE 1 TABLET BY MOUTH EVERY MORNING 30 tablet 0  . predniSONE (DELTASONE) 1 MG tablet Take 3 tablets daily along with 5 mg dose for a total of 8 mg per day. 90 tablet 0  . predniSONE (DELTASONE) 5 MG tablet Take 1 tablet daily along with 3 1 mg dose for a total of 8 mg  daily. 30 tablet 0  . traMADol (ULTRAM) 50 MG tablet Take 1 tablet (50 mg total) by mouth every 12 (twelve) hours as needed. 20 tablet 0   No current facility-administered medications for this visit.      Objective:  Ht 5\' 1"  (1.549 m)   Wt 162 lb (73.5 kg)   BMI 30.61 kg/m  self reported vitals Gen: NAD, resting comfortably Lungs: nonlabored, normal respiratory rate  Skin: appears dry, no obvious rash    Assessment and Plan   # social update-was at home for 8 weeks with covid 19.  Now back to work. Wearing mask and shield at work with mask if needed.   # Polymyalgia rheumatica S:Patient states symptoms have been reasonably stable. She saw rheumatology in June but wanted to follow-up with me before her visit on the 30th.  She wanted to thank [place] for refilling the prednisone.  She was able to transition down from 8 mg to 7 mg 2 days ago and fortunately no worsening pain right now.  They are considering methotrexate-we went over potential side effects today I know she will discuss again with Dr. Estanislado Pandy before starting A/P: Presumed polymyalgia rheumatica-As per rheumatology notes difficult diagnosis given she has carried this title since 2012.  Had extensive blood work with orthopedics and rheumatology-has a follow-up urine test for protein electrophoresis plan today. - Patient expresses her strong desire to get off prednisone in the long run and I told her I thought rheumatology would be helpful in these efforts since we have tried multiple times to wean her off and been unsuccessful  # Depression S: Depression remains in formation on Paxil based on PHQ 9 Depression screen PHQ 2/9 09/03/2018  Decreased Interest 0  Down, Depressed, Hopeless 0  PHQ - 2 Score 0  Altered sleeping 0  Tired, decreased energy 1  Change in appetite 0  Feeling bad or failure about yourself  0  Trouble concentrating 0  Moving slowly or fidgety/restless 0  Suicidal thoughts 0  PHQ-9 Score 1  Difficult doing work/chores Not difficult at all  A/P: Stable depression formation-continue current medications  Recommended follow up: Ready scheduled later this month with endocrinology Future Appointments  Date Time Provider [place]  09/11/2018  2:15 PM Bo Merino, MD [facility] None   Time Stamp The duration of face-to-face time during this visit was greater than 15 minutes. Greater than 50% of this time was spent in counseling, explanation of diagnosis, planning of further  management, and/or coordination of care including discussion of prior visit with rheumatology, review of blood work from that visit, review about questions about methotrexate.    Lab/Order associations:   ICD-10-CM   1. Polymyalgia rheumatica (HCC)  M35.3   2. Recurrent major depressive disorder, in full remission ([place]) Chronic F33.42    Return precautions advised.  Garret Reddish, MD

## 2018-09-03 NOTE — Telephone Encounter (Signed)
Pt returned call and was scheduled for today at 11:40 for virtual visit with Dr. Yong Channel.

## 2018-09-05 ENCOUNTER — Other Ambulatory Visit: Payer: Self-pay | Admitting: Family Medicine

## 2018-09-05 LAB — PROTEIN ELECTROPHORESIS,RANDOM URN
Creatinine, Urine: 48 mg/dL (ref 20–275)
Protein/Creat Ratio: 83 mg/g creat (ref 21–161)
Protein/Creatinine Ratio: 0.083 mg/mg creat (ref 0.021–0.16)
Total Protein, Urine: 4 mg/dL — ABNORMAL LOW (ref 5–24)

## 2018-09-08 NOTE — Progress Notes (Signed)
UPEP is unremarkable.

## 2018-09-10 NOTE — Progress Notes (Signed)
Pharmacy Note  Subjective: Patient presents today to the [facility] to see Dr. Estanislado Pandy.  Patient seen by the pharmacist for counseling on methotrexate for polymyalgia rheumatica. Prior therapy includes: high dose prednisone.  Objective: CBC    Component Value Date/Time   WBC 7.3 08/12/2018 0938   RBC 4.83 08/12/2018 0938   HGB 14.1 08/12/2018 0938   HCT 43.0 08/12/2018 0938   PLT 282 08/12/2018 0938   MCV 89.0 08/12/2018 0938   MCV 88.3 01/06/2016 1329   MCH 29.2 08/12/2018 0938   MCHC 32.8 08/12/2018 0938   RDW 13.7 08/12/2018 0938   LYMPHSABS 934 08/12/2018 0938   MONOABS 0.4 08/07/2013 0905   EOSABS 110 08/12/2018 0938   BASOSABS 37 08/12/2018 0938    CMP     Component Value Date/Time   NA 141 08/12/2018 0938   K 4.3 08/12/2018 0938   CL 105 08/12/2018 0938   CO2 30 08/12/2018 0938   GLUCOSE 90 08/12/2018 0938   BUN 15 08/12/2018 0938   CREATININE 0.85 08/12/2018 0938   CALCIUM 9.6 08/12/2018 0938   PROT 6.2 08/12/2018 0938   PROT 6.1 08/12/2018 0938   ALBUMIN 4.0 07/29/2017 1430   AST 14 08/12/2018 0938   ALT 13 08/12/2018 0938   ALKPHOS 86 07/29/2017 1430   BILITOT 0.5 08/12/2018 0938   GFRNONAA 70 08/12/2018 0938   GFRAA 82 08/12/2018 0938    Baseline Immunosuppressant Therapy Labs TB GOLD Quantiferon TB Gold Latest Ref Rng & Units 08/12/2018  Quantiferon TB Gold Plus NEGATIVE NEGATIVE   Hepatitis Panel Hepatitis Latest Ref Rng & Units 08/12/2018  Hep B Surface Ag NON-REACTI NON-REACTIVE  Hep B IgM NON-REACTI NON-REACTIVE  Hep C Ab NEGATIVE -  Hep C Ab NON-REACTI NON-REACTIVE  Hep C Ab NON-REACTI NON-REACTIVE   HIV Lab Results  Component Value Date   HIV NON-REACTIVE 08/12/2018   HIV NON REACTIVE 02/24/2012   Immunoglobulins Immunoglobulin Electrophoresis Latest Ref Rng & Units 08/12/2018  IgA  70 - 320 mg/dL 143  IgG 600 - 1,540 mg/dL 800  IgM 50 - 300 mg/dL 66   SPEP Serum Protein Electrophoresis Latest Ref Rng & Units  08/12/2018  Total Protein 6.1 - 8.1 g/dL 6.1  Albumin 3.8 - 4.8 g/dL 3.6(L)  Alpha-1 0.2 - 0.3 g/dL 0.3  Alpha-2 0.5 - 0.9 g/dL 0.8  Beta Globulin 0.4 - 0.6 g/dL 0.4  Beta 2 0.2 - 0.5 g/dL 0.3  Gamma Globulin 0.8 - 1.7 g/dL 0.7(L)   G6PD No results found for: G6PDH TPMT No results found for: TPMT   Chest-xray:  No acute abnormalities 04/22/2015  Contraception: post-menopausal  Alcohol use: N/A  Assessment/Plan:   Patient was counseled on the purpose, proper use, and adverse effects of methotrexate including nausea, infection, and signs and symptoms of pneumonitis. Discussed that there is the possibility of an increased risk of malignancy, specifically lymphomas, but it is not well understood if this increased risk is due to the medication or the disease state.  Instructed patient that medication should be held for infection and prior to surgery.  Advised patient to avoid live vaccines. Recommend annual influenza, Pneumovax 23, Prevnar 13, and Shingrix as indicated. She is up to date with all recommended vaccines.   Reviewed instructions with patient to take methotrexate weekly along with folic acid daily.  Discussed the importance of frequent monitoring of kidney and liver function and blood counts, and provided patient with standing lab instructions.  Counseled patient to avoid NSAIDs and  alcohol while on methotrexate.  Provided patient with educational materials on methotrexate and answered all questions.   Patient voiced understanding.  Patient consented to methotrexate use.  Will upload into chart.    Dose of methotrexate will be 4 tablets every 7 days along with folic acid 1 mg daily. Prescription sent to the pharmacy.  She will remain on prednisone and will do a slow taper due to fear of adrenal crisis.  Originally discussed decreasing by 1 mg every 2 months but patient noticed a marked decline in her symptoms when decreasing by 1 mg.  Discussed with Dr. Estanislado Pandy and will decrease  by 0.5 mg every month. She is currently taking 7 mg.  Sent new prescription to pharmacy.  All questions encouraged and answered.  Instructed patient to call with any further questions or concerns.  Mariella Saa, PharmD, [place], [place] [facility] Pharmacist [phone]  09/11/2018 3:45 PM

## 2018-09-11 ENCOUNTER — Other Ambulatory Visit: Payer: Self-pay

## 2018-09-11 ENCOUNTER — Encounter: Payer: Self-pay | Admitting: Rheumatology

## 2018-09-11 ENCOUNTER — Ambulatory Visit (INDEPENDENT_AMBULATORY_CARE_PROVIDER_SITE_OTHER): Payer: PPO | Admitting: Rheumatology

## 2018-09-11 VITALS — BP 130/87 | HR 77 | Resp 12 | Ht 61.5 in | Wt 167.0 lb

## 2018-09-11 DIAGNOSIS — E559 Vitamin D deficiency, unspecified: Secondary | ICD-10-CM

## 2018-09-11 DIAGNOSIS — M810 Age-related osteoporosis without current pathological fracture: Secondary | ICD-10-CM

## 2018-09-11 DIAGNOSIS — M353 Polymyalgia rheumatica: Secondary | ICD-10-CM | POA: Diagnosis not present

## 2018-09-11 DIAGNOSIS — Z8659 Personal history of other mental and behavioral disorders: Secondary | ICD-10-CM | POA: Diagnosis not present

## 2018-09-11 DIAGNOSIS — Z8719 Personal history of other diseases of the digestive system: Secondary | ICD-10-CM

## 2018-09-11 DIAGNOSIS — M51369 Other intervertebral disc degeneration, lumbar region without mention of lumbar back pain or lower extremity pain: Secondary | ICD-10-CM

## 2018-09-11 DIAGNOSIS — M5136 Other intervertebral disc degeneration, lumbar region: Secondary | ICD-10-CM

## 2018-09-11 DIAGNOSIS — Z7952 Long term (current) use of systemic steroids: Secondary | ICD-10-CM | POA: Diagnosis not present

## 2018-09-11 DIAGNOSIS — M19071 Primary osteoarthritis, right ankle and foot: Secondary | ICD-10-CM | POA: Diagnosis not present

## 2018-09-11 DIAGNOSIS — M19041 Primary osteoarthritis, right hand: Secondary | ICD-10-CM

## 2018-09-11 DIAGNOSIS — D126 Benign neoplasm of colon, unspecified: Secondary | ICD-10-CM

## 2018-09-11 DIAGNOSIS — M19042 Primary osteoarthritis, left hand: Secondary | ICD-10-CM

## 2018-09-11 DIAGNOSIS — Z79899 Other long term (current) drug therapy: Secondary | ICD-10-CM

## 2018-09-11 DIAGNOSIS — M19072 Primary osteoarthritis, left ankle and foot: Secondary | ICD-10-CM

## 2018-09-11 DIAGNOSIS — M1611 Unilateral primary osteoarthritis, right hip: Secondary | ICD-10-CM | POA: Diagnosis not present

## 2018-09-11 DIAGNOSIS — I2510 Atherosclerotic heart disease of native coronary artery without angina pectoris: Secondary | ICD-10-CM

## 2018-09-11 DIAGNOSIS — Z8639 Personal history of other endocrine, nutritional and metabolic disease: Secondary | ICD-10-CM

## 2018-09-11 MED ORDER — PREDNISONE 1 MG PO TABS: ORAL_TABLET | ORAL | 0 refills | Status: DC

## 2018-09-11 MED ORDER — PREDNISONE 5 MG PO TABS: ORAL_TABLET | ORAL | 0 refills | Status: DC

## 2018-09-11 MED ORDER — METHOTREXATE 2.5 MG PO TABS: ORAL_TABLET | ORAL | 0 refills | Status: DC

## 2018-09-11 MED ORDER — FOLIC ACID 1 MG PO TABS: 1.0000 mg | ORAL_TABLET | Freq: Every day | ORAL | 0 refills | Status: DC

## 2018-09-11 NOTE — Patient Instructions (Addendum)
Please taper prednisone by 0.5 mg every two month.   Standing Labs We placed an order today for your standing lab work.    Please come back and get your standing labs in 2 weeks, 4 weeks, 8 weeks, and then every 3 month.  We have open lab daily Monday through Thursday from 8:30-12:30 PM and 1:30-4:30 PM and Friday from 8:30-12:30 PM and 1:30 -4:00 PM at the office of Dr. Bo Merino.   You may experience shorter wait times on Monday and Friday afternoons. The office is located at [address], [place], [place], [place] [address] No appointment is necessary.   Labs are drawn by [facility].  You may receive a bill from [facility] for your lab work.  If you wish to have your labs drawn at another location, please call the office 24 hours in advance to send orders.  If you have any questions regarding directions or hours of operation,  please call [phone].   Just as a reminder please drink plenty of water prior to coming for your lab work. Thanks!

## 2018-09-12 ENCOUNTER — Telehealth: Payer: Self-pay | Admitting: Rheumatology

## 2018-09-12 NOTE — Telephone Encounter (Signed)
Patient calling stating MTX needs a prior auth before being filled. Per patient, she was to start medication today. Please call to advise.

## 2018-09-12 NOTE — Telephone Encounter (Signed)
Submitted a Prior Authorization request to [employer] for Methotrexate via Cover My Meds. Will update once we receive a response.

## 2018-09-12 NOTE — Telephone Encounter (Signed)
Received notification from [employer] regarding a prior authorization for Methotrexate. Authorization has been APPROVED from 09/12/2018  to 09/11/2118.   Will send document to scan center.  Patient advised.

## 2018-09-12 NOTE — Telephone Encounter (Signed)
Attempted to contact the patient and left message to advise patient prior authorization on Methotrexate has been submitted. Awaiting response from insurance company.

## 2018-09-24 ENCOUNTER — Other Ambulatory Visit: Payer: Self-pay | Admitting: Family Medicine

## 2018-09-24 NOTE — Telephone Encounter (Signed)
Forwarding to Dr. Estanislado Pandy.

## 2018-09-24 NOTE — Telephone Encounter (Signed)
Patient is on prednisone taper for polymyalgia rheumatica.  Please refill the prednisone prescription as per the instructions in the chart.

## 2018-09-25 NOTE — Telephone Encounter (Signed)
Last Visit: 09/11/18 Next Visit: 12/11/18  Patient states she is currently on 7 mg of Prednisone and due to go to 6.5 mg on Sunday.   Okay to refill per Dr. Estanislado Pandy

## 2018-09-29 ENCOUNTER — Other Ambulatory Visit: Payer: Self-pay

## 2018-09-29 DIAGNOSIS — M353 Polymyalgia rheumatica: Secondary | ICD-10-CM

## 2018-09-30 LAB — COMPLETE METABOLIC PANEL WITH GFR
AG Ratio: 1.7 (calc) (ref 1.0–2.5)
ALT: 22 U/L (ref 6–29)
AST: 24 U/L (ref 10–35)
Albumin: 3.8 g/dL (ref 3.6–5.1)
Alkaline phosphatase (APISO): 90 U/L (ref 37–153)
BUN: 15 mg/dL (ref 7–25)
CO2: 27 mmol/L (ref 20–32)
Calcium: 9.4 mg/dL (ref 8.6–10.4)
Chloride: 104 mmol/L (ref 98–110)
Creat: 0.78 mg/dL (ref 0.50–0.99)
GFR, Est African American: 90 mL/min/{1.73_m2} (ref >=60)
GFR, Est Non African American: 78 mL/min/{1.73_m2} (ref >=60)
Globulin: 2.2 g/dL (calc) (ref 1.9–3.7)
Glucose, Bld: 89 mg/dL (ref 65–99)
Potassium: 4.4 mmol/L (ref 3.5–5.3)
Sodium: 141 mmol/L (ref 135–146)
Total Bilirubin: 0.5 mg/dL (ref 0.2–1.2)
Total Protein: 6 g/dL — ABNORMAL LOW (ref 6.1–8.1)

## 2018-09-30 LAB — CBC WITH DIFFERENTIAL/PLATELET
Absolute Monocytes: 431 cells/uL (ref 200–950)
Basophils Absolute: 31 cells/uL (ref 0–200)
Basophils Relative: 0.4 %
Eosinophils Absolute: 131 cells/uL (ref 15–500)
Eosinophils Relative: 1.7 %
HCT: 43.6 % (ref 35.0–45.0)
Hemoglobin: 14.1 g/dL (ref 11.7–15.5)
Lymphs Abs: 1363 cells/uL (ref 850–3900)
MCH: 29.1 pg (ref 27.0–33.0)
MCHC: 32.3 g/dL (ref 32.0–36.0)
MCV: 90.1 fL (ref 80.0–100.0)
MPV: 10.5 fL (ref 7.5–12.5)
Monocytes Relative: 5.6 %
Neutro Abs: 5744 cells/uL (ref 1500–7800)
Neutrophils Relative %: 74.6 %
Platelets: 317 10*3/uL (ref 140–400)
RBC: 4.84 10*6/uL (ref 3.80–5.10)
RDW: 13.1 % (ref 11.0–15.0)
Total Lymphocyte: 17.7 %
WBC: 7.7 10*3/uL (ref 3.8–10.8)

## 2018-09-30 NOTE — Progress Notes (Signed)
WNLs

## 2018-10-09 ENCOUNTER — Other Ambulatory Visit: Payer: Self-pay | Admitting: Family Medicine

## 2018-10-14 ENCOUNTER — Other Ambulatory Visit: Payer: Self-pay

## 2018-10-14 DIAGNOSIS — M353 Polymyalgia rheumatica: Secondary | ICD-10-CM

## 2018-10-15 LAB — CBC WITH DIFFERENTIAL/PLATELET
Absolute Monocytes: 410 cells/uL (ref 200–950)
Basophils Absolute: 57 cells/uL (ref 0–200)
Basophils Relative: 0.9 %
Eosinophils Absolute: 258 cells/uL (ref 15–500)
Eosinophils Relative: 4.1 %
HCT: 41.8 % (ref 35.0–45.0)
Hemoglobin: 13.7 g/dL (ref 11.7–15.5)
Lymphs Abs: 1203 cells/uL (ref 850–3900)
MCH: 29.6 pg (ref 27.0–33.0)
MCHC: 32.8 g/dL (ref 32.0–36.0)
MCV: 90.3 fL (ref 80.0–100.0)
MPV: 10.8 fL (ref 7.5–12.5)
Monocytes Relative: 6.5 %
Neutro Abs: 4372 cells/uL (ref 1500–7800)
Neutrophils Relative %: 69.4 %
Platelets: 256 10*3/uL (ref 140–400)
RBC: 4.63 10*6/uL (ref 3.80–5.10)
RDW: 13.5 % (ref 11.0–15.0)
Total Lymphocyte: 19.1 %
WBC: 6.3 10*3/uL (ref 3.8–10.8)

## 2018-10-15 LAB — COMPLETE METABOLIC PANEL WITH GFR
AG Ratio: 1.8 (calc) (ref 1.0–2.5)
ALT: 18 U/L (ref 6–29)
AST: 19 U/L (ref 10–35)
Albumin: 3.7 g/dL (ref 3.6–5.1)
Alkaline phosphatase (APISO): 90 U/L (ref 37–153)
BUN: 14 mg/dL (ref 7–25)
CO2: 26 mmol/L (ref 20–32)
Calcium: 9 mg/dL (ref 8.6–10.4)
Chloride: 104 mmol/L (ref 98–110)
Creat: 0.94 mg/dL (ref 0.50–0.99)
GFR, Est African American: 72 mL/min/{1.73_m2} (ref >=60)
GFR, Est Non African American: 62 mL/min/{1.73_m2} (ref >=60)
Globulin: 2.1 g/dL (calc) (ref 1.9–3.7)
Glucose, Bld: 133 mg/dL — ABNORMAL HIGH (ref 65–99)
Potassium: 3.9 mmol/L (ref 3.5–5.3)
Sodium: 140 mmol/L (ref 135–146)
Total Bilirubin: 0.8 mg/dL (ref 0.2–1.2)
Total Protein: 5.8 g/dL — ABNORMAL LOW (ref 6.1–8.1)

## 2018-10-15 NOTE — Progress Notes (Signed)
stable °

## 2018-10-27 ENCOUNTER — Telehealth: Payer: Self-pay

## 2018-10-27 NOTE — Telephone Encounter (Signed)
Called pt and left VM to call the office. If pt returns call, please request that she fax a copy of culture report to Dr. Yong Channel at [phone] so that he can review the sensitivities.

## 2018-10-27 NOTE — Telephone Encounter (Signed)
Copied from [place] [id number]. 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 Yong Channel to send in a prescription. Pt requests call back. Cb# [phone]

## 2018-11-03 ENCOUNTER — Other Ambulatory Visit: Payer: Self-pay | Admitting: Family Medicine

## 2018-11-04 ENCOUNTER — Ambulatory Visit: Payer: PPO

## 2018-11-04 NOTE — Telephone Encounter (Signed)
Called pt and left VM to call the office.   Have sx resolved?

## 2018-11-06 NOTE — Telephone Encounter (Signed)
Unable to reach pt by phone, message sent via [place].

## 2018-11-07 ENCOUNTER — Telehealth: Payer: Self-pay | Admitting: Rheumatology

## 2018-11-07 DIAGNOSIS — E559 Vitamin D deficiency, unspecified: Secondary | ICD-10-CM

## 2018-11-07 NOTE — Telephone Encounter (Signed)
Patient left a voicemail stating she will come by the office next week to have labwork.  Patient is also requesting her Vitamin D level be checked at that time.

## 2018-11-10 NOTE — Telephone Encounter (Signed)
-

## 2018-11-10 NOTE — Telephone Encounter (Signed)
Ok to add vitamin D to lab orders.

## 2018-11-11 ENCOUNTER — Other Ambulatory Visit: Payer: Self-pay | Admitting: Family Medicine

## 2018-11-12 ENCOUNTER — Ambulatory Visit: Payer: PPO

## 2018-11-13 ENCOUNTER — Other Ambulatory Visit: Payer: Self-pay | Admitting: *Deleted

## 2018-11-13 MED ORDER — PREDNISONE 5 MG PO TABS: ORAL_TABLET | ORAL | 0 refills | Status: DC

## 2018-11-13 NOTE — Telephone Encounter (Signed)
Refill request received via fax  Last Visit: 09/11/18 Next Visit: 12/11/18  Okay to refill per Dr. Estanislado Pandy

## 2018-11-25 ENCOUNTER — Other Ambulatory Visit: Payer: Self-pay

## 2018-11-25 DIAGNOSIS — E559 Vitamin D deficiency, unspecified: Secondary | ICD-10-CM | POA: Diagnosis not present

## 2018-11-25 DIAGNOSIS — M353 Polymyalgia rheumatica: Secondary | ICD-10-CM

## 2018-11-26 ENCOUNTER — Telehealth: Payer: Self-pay | Admitting: *Deleted

## 2018-11-26 DIAGNOSIS — E559 Vitamin D deficiency, unspecified: Secondary | ICD-10-CM

## 2018-11-26 LAB — CBC WITH DIFFERENTIAL/PLATELET
Absolute Monocytes: 425 cells/uL (ref 200–950)
Basophils Absolute: 50 cells/uL (ref 0–200)
Basophils Relative: 0.7 %
Eosinophils Absolute: 122 cells/uL (ref 15–500)
Eosinophils Relative: 1.7 %
HCT: 42.3 % (ref 35.0–45.0)
Hemoglobin: 13.6 g/dL (ref 11.7–15.5)
Lymphs Abs: 1332 cells/uL (ref 850–3900)
MCH: 29.8 pg (ref 27.0–33.0)
MCHC: 32.2 g/dL (ref 32.0–36.0)
MCV: 92.6 fL (ref 80.0–100.0)
MPV: 11.1 fL (ref 7.5–12.5)
Monocytes Relative: 5.9 %
Neutro Abs: 5270 cells/uL (ref 1500–7800)
Neutrophils Relative %: 73.2 %
Platelets: 289 10*3/uL (ref 140–400)
RBC: 4.57 10*6/uL (ref 3.80–5.10)
RDW: 14.2 % (ref 11.0–15.0)
Total Lymphocyte: 18.5 %
WBC: 7.2 10*3/uL (ref 3.8–10.8)

## 2018-11-26 LAB — COMPLETE METABOLIC PANEL WITH GFR
AG Ratio: 1.6 (calc) (ref 1.0–2.5)
ALT: 15 U/L (ref 6–29)
AST: 16 U/L (ref 10–35)
Albumin: 3.8 g/dL (ref 3.6–5.1)
Alkaline phosphatase (APISO): 91 U/L (ref 37–153)
BUN: 16 mg/dL (ref 7–25)
CO2: 30 mmol/L (ref 20–32)
Calcium: 9.2 mg/dL (ref 8.6–10.4)
Chloride: 104 mmol/L (ref 98–110)
Creat: 0.82 mg/dL (ref 0.50–0.99)
GFR, Est African American: 85 mL/min/{1.73_m2} (ref >=60)
GFR, Est Non African American: 73 mL/min/{1.73_m2} (ref >=60)
Globulin: 2.4 g/dL (calc) (ref 1.9–3.7)
Glucose, Bld: 85 mg/dL (ref 65–99)
Potassium: 3.9 mmol/L (ref 3.5–5.3)
Sodium: 139 mmol/L (ref 135–146)
Total Bilirubin: 0.6 mg/dL (ref 0.2–1.2)
Total Protein: 6.2 g/dL (ref 6.1–8.1)

## 2018-11-26 LAB — VITAMIN D 25 HYDROXY (VIT D DEFICIENCY, FRACTURES): Vit D, 25-Hydroxy: 24 ng/mL — ABNORMAL LOW (ref 30–100)

## 2018-11-26 MED ORDER — VITAMIN D (ERGOCALCIFEROL) 1.25 MG (50000 UNIT) PO CAPS: 50000.0000 [IU] | ORAL_CAPSULE | ORAL | 0 refills | Status: DC

## 2018-11-26 NOTE — Telephone Encounter (Signed)
-----   Message from Ofilia Neas, PA-C sent at 11/26/2018  8:12 AM EDT ----- Vitamin D is low.  Please notify patient and send in vitamin D 50,000 units by mouth once weekly for 3 months. Recheck vitamin D level in 3 months.  CBC and CMP WNL

## 2018-11-26 NOTE — Progress Notes (Signed)
Office Visit Note  Patient: [name]             Date of Birth: [birth year]           MRN: [id number]             PCP: [name], MD Referring: [name], MD Visit Date: 12/10/2018 Occupation: @GUAROCC @  Subjective:  Pain in left hip.     History of Present Illness: [name] is a 68 y.o. female with history of polymyalgia rheumatica, osteoarthritis and degenerative disc disease.  She states she has decreased prednisone down to 5 mg p.o. daily.  She has been taking methotrexate 4 tablets/week which she has been tolerating well.  She has been experiencing pain in her left hip for the last 2 weeks.  None of the other joints are painful.  She denies any muscle weakness or tenderness.  Activities of Daily Living:  Patient reports morning stiffness for 24 hours.   Patient Reports nocturnal pain.  Difficulty dressing/grooming: Denies Difficulty climbing stairs: Reports Difficulty getting out of chair: Denies Difficulty using hands for taps, buttons, cutlery, and/or writing: Denies  Review of Systems  Constitutional: Negative for fatigue, night sweats, weight gain and weight loss.  HENT: Negative for mouth sores, trouble swallowing, trouble swallowing, mouth dryness and nose dryness.   Eyes: Negative for pain, redness, itching, visual disturbance and dryness.  Respiratory: Negative for cough, shortness of breath, wheezing and difficulty breathing.   Cardiovascular: Negative for chest pain, palpitations, hypertension, irregular heartbeat and swelling in legs/feet.  Gastrointestinal: Negative for blood in stool, constipation and diarrhea.  Endocrine: Negative for increased urination.  Genitourinary: Negative for difficulty urinating, painful urination and vaginal dryness.  Musculoskeletal: Positive for arthralgias, joint pain and morning stiffness. Negative for joint swelling, myalgias, muscle weakness, muscle tenderness and myalgias.  Skin: Negative for color  change, rash, hair loss, skin tightness, ulcers and sensitivity to sunlight.  Allergic/Immunologic: Negative for susceptible to infections.  Neurological: Negative for dizziness, light-headedness, numbness, headaches, memory loss, night sweats and weakness.  Hematological: Negative for bruising/bleeding tendency and swollen glands.  Psychiatric/Behavioral: Negative for depressed mood, confusion and sleep disturbance. The patient is not nervous/anxious.     PMFS History:  Patient Active Problem List   Diagnosis Date Noted  . High risk medication use 12/04/2018  . Recurrent major depressive disorder, in full remission ([facility]) 09/03/2018  . Lower esophageal ring ([place]) 03/03/2018  . Osteoarthritis of right hip 12/02/2017  . GERD (gastroesophageal reflux disease) 12/15/2015  . Polyp of colon, adenomatous 10/11/2014  . Vitamin D deficiency 05/26/2014  . Osteoporosis 05/26/2014  . Polymyalgia rheumatica ([place]) 12/01/2010  . CAD (coronary artery disease) 09/09/2007  . Hematuria 09/09/2007  . Depression 03/20/2007  . Hypothyroid 02/13/1999    Past Medical History:  Diagnosis Date  . Anxiety   . Arthritis   . CAD (coronary artery disease)   . Cataract   . Depression   . DISORDER, MENOPAUSAL NOS 03/20/2007   Qualifier: Diagnosis of  By: Arnoldo Morale MD, Balinda Quails   . GERD (gastroesophageal reflux disease)   . Hyperlipidemia   . Hypothyroidism   . MYOCARDIAL INFARCTION, HX OF 09/09/2007   Qualifier: Diagnosis of  By: Leanne Chang MD, [place]    . Osteoporosis   . Polymyalgia rheumatica (HCC)     Family History  Problem Relation Age of Onset  . Hypertension Father   . Heart disease Father        Mi age 69,  65, 80 and 91.  . Cancer Father   . Hyperlipidemia Father   . Colon cancer Maternal Uncle   . Hypertension Mother   . Cancer Mother   . Hyperlipidemia Mother   . Pulmonary fibrosis Sister   . Epilepsy Son   . Healthy Son   . Healthy Son   . Rheum arthritis Niece   . Lupus Niece   .  Rheum arthritis Niece    Past Surgical History:  Procedure Laterality Date  . CATARACT EXTRACTION    . CESAREAN SECTION    . CHOLECYSTECTOMY N/A 11/24/2012   Procedure: LAPAROSCOPIC CHOLECYSTECTOMY;  Surgeon: Ralene Ok, MD;  Location: [facility];  Service: General;  Laterality: N/A;  . COSMETIC SURGERY    . EYE SURGERY     eye lids lifted  . PTCA     Social History   Social History Narrative   Husband and 2 adult children live at home. No grandkids. Son with epilepsy.       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 No part time.    Immunization History  Administered Date(s) Administered  . Influenza Whole 11/09/2008  . Influenza, High Dose Seasonal PF 10/30/2016, 12/02/2017, 11/08/2018  . Influenza-Unspecified 11/08/2018  . Pneumococcal Conjugate-13 10/11/2014  . Pneumococcal Polysaccharide-23 12/15/2015  . Td 02/12/2006  . Tdap 04/26/2017  . Zoster 09/19/2010     Objective: Vital Signs: BP (!) 145/92 (BP Location: Left Arm, Patient Position: Sitting, Cuff Size: Normal)   Pulse 64   Resp 13   Ht 5' 1.5" (1.562 m)   Wt 162 lb (73.5 kg)   BMI 30.11 kg/m    Physical Exam Vitals signs and nursing note reviewed.  Constitutional:      Appearance: She is well-developed.  HENT:     Head: Normocephalic and atraumatic.  Eyes:     Conjunctiva/sclera: Conjunctivae normal.  Neck:     Musculoskeletal: Normal range of motion.  Cardiovascular:     Rate and Rhythm: Normal rate and regular rhythm.     Heart sounds: Normal heart sounds.  Pulmonary:     Effort: Pulmonary effort is normal.     Breath sounds: Normal breath sounds.  Abdominal:     General: Bowel sounds are normal.     Palpations: Abdomen is soft.  Lymphadenopathy:     Cervical: No cervical adenopathy.  Skin:    General: Skin is warm and dry.     Capillary Refill: Capillary refill takes less than 2 seconds.  Neurological:     Mental Status: She is alert and oriented to person, place, and time.  Psychiatric:         Behavior: Behavior normal.      Musculoskeletal Exam: Patient had good range of motion of her cervical spine.  Shoulder joints, elbow joints, wrist joints with good range of motion.  She has DIP and PIP thickening but no synovitis.  She has good range of motion of bilateral hip joints and knee joints.  She had tenderness over the left trochanteric bursa.  Ankle joints and MTPs with good range of motion with no synovitis.  She has no muscular weakness or tenderness.  She has no difficulty getting out of the chair.  CDAI Exam: CDAI Score: - Patient Global: -; Provider Global: - Swollen: -; Tender: - Joint Exam   No joint exam has been documented for this visit   There is currently no information documented on the homunculus. Go to the Rheumatology activity and complete the  homunculus joint exam.  Investigation: No additional findings.  Imaging: No results found.  Recent Labs: Lab Results  Component Value Date   WBC 7.2 11/25/2018   HGB 13.6 11/25/2018   PLT 289 11/25/2018   NA 139 11/25/2018   K 3.9 11/25/2018   CL 104 11/25/2018   CO2 30 11/25/2018   GLUCOSE 85 11/25/2018   BUN 16 11/25/2018   CREATININE 0.82 11/25/2018   BILITOT 0.6 11/25/2018   ALKPHOS 86 07/29/2017   AST 16 11/25/2018   ALT 15 11/25/2018   PROT 6.2 11/25/2018   ALBUMIN 4.0 07/29/2017   CALCIUM 9.2 11/25/2018   GFRAA 85 11/25/2018   QFTBGOLDPLUS NEGATIVE 08/12/2018    Speciality Comments: No specialty comments available.  Procedures:  Large Joint Inj: L greater trochanter on 12/10/2018 11:30 AM Indications: pain Details: 27 G 1.5 in needle, lateral approach  Arthrogram: No  Medications: 40 mg triamcinolone acetonide 40 MG/ML; 1.5 mL lidocaine 1 % Aspirate: 0 mL Outcome: tolerated well, no immediate complications Procedure, treatment alternatives, risks and benefits explained, specific risks discussed. Consent was given by the patient. Immediately prior to procedure a time out was  called to verify the correct patient, procedure, equipment, support staff and site/side marked as required. Patient was prepped and draped in the usual sterile fashion.     Allergies: Morphine and related, Prochlorperazine, Simvastatin, and Sulfa antibiotics   Assessment / Plan:     Visit Diagnoses: Polymyalgia rheumatica ([place]) - Diagnosed 7 years ago by rheumatologist change had been on prednisone since then.  Unable to taper prednisone.  She is doing very well on 5 mg of prednisone currently.  She will taper by 1 mg every month.  We will refill her medications today.- Plan: folic acid (FOLVITE) 1 MG tablet, methotrexate (RHEUMATREX) 2.5 MG tablet  High risk medication use - Methotrexate 4 tablets every 7 days and folic acid 1 mg daily.  Most recent CBC/CMP within normal limits on 11/25/2018 and will monitor every 3 months.  Long term (current) use of systemic steroids - she has been on prednisone for 7 years.  Trochanteric bursitis, left hip-she has severe pain and discomfort in her left trochanteric area.  She was having difficulty walking.  IT band exercises were demonstrated.  Per her request the left trochanteric bursa was injected with cortisone which she tolerated well.  Primary osteoarthritis of both hands-she has DIP and PIP thickening in her hands consistent with osteoarthritis.  Primary osteoarthritis of both feet-she has been using proper fitting shoes which is helpful.  Primary osteoarthritis of right hip-she has good range of motion today without discomfort.  DDD (degenerative disc disease), lumbar-chronic pain which is tolerable at this point.  Age-related osteoporosis without current pathological fracture - Fosamax 70 mg every 7 days prescribed by her PCP Dr. Yong Channel.  Most recent DEXA on 06/04/2017 showed T score of -2.6 at total left hip and a -5% change at the lef  Vitamin D deficiency-she is on vitamin D supplement we will recheck her labs in 3 months.  Coronary artery  disease involving native coronary artery of native heart without angina pectoris  History of hypothyroidism  History of gastroesophageal reflux (GERD)  History of depression  Adenomatous polyp of colon, unspecified part of colon  Orders: Orders Placed This Encounter  Procedures  . Large Joint Inj: L greater trochanter   Meds ordered this encounter  Medications  . predniSONE (DELTASONE) 1 MG tablet    Sig: Take 4 tablets by mouth daily,  follow taper as instructed.    Dispense:  120 tablet    Refill:  1  . folic acid (FOLVITE) 1 MG tablet    Sig: Take 1 tablet (1 mg total) by mouth daily.    Dispense:  90 tablet    Refill:  2  . methotrexate (RHEUMATREX) 2.5 MG tablet    Sig: Take 4 tablets (10 mg) once a week on Friday evenings. Caution:Chemotherapy. Protect from light.    Dispense:  48 tablet    Refill:  0      Follow-Up Instructions: Return in about 3 months (around 03/12/2019) for PMR, OA.   Bo Merino, MD  Note - This record has been created using Editor, commissioning.  Chart creation errors have been sought, but may not always  have been located. Such creation errors do not reflect on  the standard of medical care.

## 2018-12-04 DIAGNOSIS — Z79899 Other long term (current) drug therapy: Secondary | ICD-10-CM | POA: Insufficient documentation

## 2018-12-10 ENCOUNTER — Other Ambulatory Visit: Payer: Self-pay

## 2018-12-10 ENCOUNTER — Encounter: Payer: Self-pay | Admitting: Physician Assistant

## 2018-12-10 ENCOUNTER — Ambulatory Visit (INDEPENDENT_AMBULATORY_CARE_PROVIDER_SITE_OTHER): Payer: PPO | Admitting: Rheumatology

## 2018-12-10 VITALS — BP 145/92 | HR 64 | Resp 13 | Ht 61.5 in | Wt 162.0 lb

## 2018-12-10 DIAGNOSIS — Z7952 Long term (current) use of systemic steroids: Secondary | ICD-10-CM | POA: Diagnosis not present

## 2018-12-10 DIAGNOSIS — E559 Vitamin D deficiency, unspecified: Secondary | ICD-10-CM | POA: Diagnosis not present

## 2018-12-10 DIAGNOSIS — M19071 Primary osteoarthritis, right ankle and foot: Secondary | ICD-10-CM

## 2018-12-10 DIAGNOSIS — M353 Polymyalgia rheumatica: Secondary | ICD-10-CM | POA: Diagnosis not present

## 2018-12-10 DIAGNOSIS — M7062 Trochanteric bursitis, left hip: Secondary | ICD-10-CM | POA: Diagnosis not present

## 2018-12-10 DIAGNOSIS — Z8639 Personal history of other endocrine, nutritional and metabolic disease: Secondary | ICD-10-CM

## 2018-12-10 DIAGNOSIS — Z79899 Other long term (current) drug therapy: Secondary | ICD-10-CM | POA: Diagnosis not present

## 2018-12-10 DIAGNOSIS — M19041 Primary osteoarthritis, right hand: Secondary | ICD-10-CM | POA: Diagnosis not present

## 2018-12-10 DIAGNOSIS — M810 Age-related osteoporosis without current pathological fracture: Secondary | ICD-10-CM

## 2018-12-10 DIAGNOSIS — M5136 Other intervertebral disc degeneration, lumbar region: Secondary | ICD-10-CM | POA: Diagnosis not present

## 2018-12-10 DIAGNOSIS — Z8719 Personal history of other diseases of the digestive system: Secondary | ICD-10-CM

## 2018-12-10 DIAGNOSIS — I2510 Atherosclerotic heart disease of native coronary artery without angina pectoris: Secondary | ICD-10-CM | POA: Diagnosis not present

## 2018-12-10 DIAGNOSIS — M51369 Other intervertebral disc degeneration, lumbar region without mention of lumbar back pain or lower extremity pain: Secondary | ICD-10-CM

## 2018-12-10 DIAGNOSIS — M19072 Primary osteoarthritis, left ankle and foot: Secondary | ICD-10-CM

## 2018-12-10 DIAGNOSIS — Z8659 Personal history of other mental and behavioral disorders: Secondary | ICD-10-CM

## 2018-12-10 DIAGNOSIS — M1611 Unilateral primary osteoarthritis, right hip: Secondary | ICD-10-CM

## 2018-12-10 DIAGNOSIS — M19042 Primary osteoarthritis, left hand: Secondary | ICD-10-CM

## 2018-12-10 DIAGNOSIS — D126 Benign neoplasm of colon, unspecified: Secondary | ICD-10-CM

## 2018-12-10 MED ORDER — METHOTREXATE 2.5 MG PO TABS: ORAL_TABLET | ORAL | 0 refills | Status: DC

## 2018-12-10 MED ORDER — FOLIC ACID 1 MG PO TABS: 1.0000 mg | ORAL_TABLET | Freq: Every day | ORAL | 2 refills | Status: DC

## 2018-12-10 MED ORDER — PREDNISONE 1 MG PO TABS: ORAL_TABLET | ORAL | 1 refills | Status: DC

## 2018-12-10 MED ORDER — TRIAMCINOLONE ACETONIDE 40 MG/ML IJ SUSP
40.0000 mg | INTRAMUSCULAR | Status: AC | PRN
  Administered 2018-12-10: 40 mg via INTRA_ARTICULAR

## 2018-12-10 MED ORDER — LIDOCAINE HCL 1 % IJ SOLN
1.5000 mL | INTRAMUSCULAR | Status: AC | PRN
  Administered 2018-12-10: 1.5 mL

## 2018-12-10 NOTE — Patient Instructions (Signed)
Standing Labs We placed an order today for your standing lab work.    Please come back and get your standing labs in January with vitamin D level and then every 3 months  We have open lab daily Monday through Thursday from 8:30-12:30 PM and 1:30-4:30 PM and Friday from 8:30-12:30 PM and 1:30-4:00 PM at the office of Dr. Bo Merino.   You may experience shorter wait times on Monday and Friday afternoons. The office is located at [address] [place], [place], [place] [address] No appointment is necessary.   Labs are drawn by [facility].  You may receive a bill from [facility] for your lab work.  If you wish to have your labs drawn at another location, please call the office 24 hours in advance to send orders.  If you have any questions regarding directions or hours of operation,  please call [phone].   Just as a reminder please drink plenty of water prior to coming for your lab work. Thanks!

## 2018-12-11 ENCOUNTER — Ambulatory Visit: Payer: PPO | Admitting: Physician Assistant

## 2018-12-12 ENCOUNTER — Other Ambulatory Visit: Payer: Self-pay | Admitting: Family Medicine

## 2018-12-15 ENCOUNTER — Other Ambulatory Visit: Payer: Self-pay

## 2018-12-15 DIAGNOSIS — E785 Hyperlipidemia, unspecified: Secondary | ICD-10-CM

## 2018-12-30 ENCOUNTER — Encounter: Payer: Self-pay | Admitting: Family Medicine

## 2018-12-30 DIAGNOSIS — M791 Myalgia, unspecified site: Secondary | ICD-10-CM | POA: Insufficient documentation

## 2018-12-31 ENCOUNTER — Other Ambulatory Visit: Payer: Self-pay

## 2019-01-01 ENCOUNTER — Encounter: Payer: Self-pay | Admitting: Family Medicine

## 2019-01-01 ENCOUNTER — Ambulatory Visit (INDEPENDENT_AMBULATORY_CARE_PROVIDER_SITE_OTHER): Payer: PPO | Admitting: Family Medicine

## 2019-01-01 VITALS — BP 128/82 | Temp 98.6°F | Ht 61.5 in | Wt 161.0 lb

## 2019-01-01 DIAGNOSIS — F3342 Major depressive disorder, recurrent, in full remission: Secondary | ICD-10-CM | POA: Diagnosis not present

## 2019-01-01 DIAGNOSIS — M353 Polymyalgia rheumatica: Secondary | ICD-10-CM | POA: Diagnosis not present

## 2019-01-01 DIAGNOSIS — I2510 Atherosclerotic heart disease of native coronary artery without angina pectoris: Secondary | ICD-10-CM

## 2019-01-01 DIAGNOSIS — R739 Hyperglycemia, unspecified: Secondary | ICD-10-CM | POA: Diagnosis not present

## 2019-01-01 DIAGNOSIS — G720 Drug-induced myopathy: Secondary | ICD-10-CM

## 2019-01-01 DIAGNOSIS — Z0000 Encounter for general adult medical examination without abnormal findings: Secondary | ICD-10-CM

## 2019-01-01 DIAGNOSIS — M810 Age-related osteoporosis without current pathological fracture: Secondary | ICD-10-CM | POA: Diagnosis not present

## 2019-01-01 DIAGNOSIS — M791 Myalgia, unspecified site: Secondary | ICD-10-CM

## 2019-01-01 DIAGNOSIS — E559 Vitamin D deficiency, unspecified: Secondary | ICD-10-CM

## 2019-01-01 DIAGNOSIS — M1611 Unilateral primary osteoarthritis, right hip: Secondary | ICD-10-CM

## 2019-01-01 DIAGNOSIS — E034 Atrophy of thyroid (acquired): Secondary | ICD-10-CM | POA: Diagnosis not present

## 2019-01-01 DIAGNOSIS — D126 Benign neoplasm of colon, unspecified: Secondary | ICD-10-CM | POA: Diagnosis not present

## 2019-01-01 DIAGNOSIS — Z79899 Other long term (current) drug therapy: Secondary | ICD-10-CM | POA: Diagnosis not present

## 2019-01-01 DIAGNOSIS — K219 Gastro-esophageal reflux disease without esophagitis: Secondary | ICD-10-CM

## 2019-01-01 DIAGNOSIS — K222 Esophageal obstruction: Secondary | ICD-10-CM | POA: Diagnosis not present

## 2019-01-01 DIAGNOSIS — E785 Hyperlipidemia, unspecified: Secondary | ICD-10-CM | POA: Diagnosis not present

## 2019-01-01 LAB — LIPID PANEL
Chol/HDL Ratio: 3.2 ratio (ref 0.0–4.4)
Cholesterol, Total: 266 mg/dL — ABNORMAL HIGH (ref 100–199)
HDL: 82 mg/dL (ref >39)
LDL Chol Calc (NIH): 175 mg/dL — ABNORMAL HIGH (ref 0–99)
Triglycerides: 57 mg/dL (ref 0–149)
VLDL Cholesterol Cal: 9 mg/dL (ref 5–40)

## 2019-01-01 MED ORDER — LEVOTHYROXINE SODIUM 112 MCG PO TABS: 112.0000 ug | ORAL_TABLET | Freq: Every day | ORAL | 3 refills | Status: DC

## 2019-01-01 MED ORDER — PANTOPRAZOLE SODIUM 40 MG PO TBEC: 40.0000 mg | DELAYED_RELEASE_TABLET | Freq: Every day | ORAL | 3 refills | Status: DC

## 2019-01-01 MED ORDER — PAROXETINE HCL 20 MG PO TABS: 20.0000 mg | ORAL_TABLET | Freq: Every morning | ORAL | 3 refills | Status: DC

## 2019-01-01 MED ORDER — ALENDRONATE SODIUM 70 MG PO TABS: ORAL_TABLET | ORAL | 2 refills | Status: DC

## 2019-01-01 NOTE — Progress Notes (Addendum)
Phone: [phone]    Subjective:   Patient presents today for their annual wellness visit.    Preventive Screening-Counseling & Management  Smoking Status: Never Smoker Second Hand Smoking status: No smokers in home Alcohol intake: 0 per week  Advanced directives: she does not have HCPOA/advanced directives set up- packet given  Risk Factors Regular exercise: no, but active with work, encouraged to at least walk on the weekends Diet: reasonably healthy diet- able to lose 6 lbs over last few months  Wt Readings from Last 3 Encounters:  01/01/19 161 lb (73 kg)  12/10/18 162 lb (73.5 kg)  09/11/18 167 lb (75.8 kg)  Functional ability: no limitations-directly observed patient get onto and off of table without difficulty or delay  Fall Risk: None  Fall Risk  01/01/2019 07/29/2017 01/06/2016 12/15/2015  Falls in the past year? 0 No No No  Number falls in past yr: 0 - - -  Injury with Fall? 0 - - -  Opioid use history:  no long term opioids use  Cardiac risk factors:  Known CAD- follows with Dr. Percival Spanish advanced age (older than 55 for men, 83 for women)  known Hyperlipidemia  no Hypertension  No diabetes. Will update a1c Lab Results  Component Value Date   HGBA1C 6.1 (H) 02/23/2012  Family History: dad with MI age 73   Depression Screen None. PHQ2 0 as well as phq9 Depression screen [facility] 2/9 01/01/2019 09/03/2018 07/29/2017 07/29/2017 01/06/2016  Decreased Interest 0 0 0 0 0  Down, Depressed, Hopeless 0 0 0 0 0  PHQ - 2 Score 0 0 0 0 0  Altered sleeping 0 0 0 - -  Tired, decreased energy 0 1 1 - -  Change in appetite 0 0 0 - -  Feeling bad or failure about yourself  0 0 0 - -  Trouble concentrating 0 0 0 - -  Moving slowly or fidgety/restless 0 0 0 - -  Suicidal thoughts 0 0 0 - -  PHQ-9 Score 0 1 1 - -  Difficult doing work/chores Not difficult at all Not difficult at all Somewhat difficult - -  Some recent data might be hidden    Activities of Daily Living  Independent ADLs and IADLs   Hearing Difficulties: -patient declines  Cognitive Testing             No reported trouble.   Mini cog: normal clock draw. 3/3 delayed recall. Normal test result   banana sunrise chair   List the Names of Other Physician/Practitioners you currently use: Patient Care Team: [name], MD as PCP - General (Family Medicine) Franchot Gallo, MD as Consulting Physician (Urology) -Dr. Estanislado Pandy Rheumatology -Dr. Havery Moros GI -Dr. Lorin Mercy ortho -dr. Percival Spanish cardiology  Immunization History  Administered Date(s) Administered  . Influenza Whole 11/09/2008  . Influenza, High Dose Seasonal PF 10/30/2016, 12/02/2017, 11/08/2018  . Influenza-Unspecified 11/08/2018  . Pneumococcal Conjugate-13 10/11/2014  . Pneumococcal Polysaccharide-23 12/15/2015  . Td 02/12/2006  . Tdap 04/26/2017  . Zoster 09/19/2010   Required Immunizations needed today - discussed shingrix at pharmacy Health Maintenance  Topic Date Due  . Colon Cancer Screening  07/23/2019  . Mammogram  07/24/2020  . Tetanus Vaccine  04/27/2027  . Flu Shot  Completed  . DEXA scan (bone density measurement)  Completed  .  Hepatitis C: One time screening is recommended by Center for Disease Control  (CDC) for  adults born from [birth year] through 1966.   Completed  . Pneumonia vaccines  Completed   Screening tests-  1. Colon cancer screening- due 2021 2. Lung Cancer screening- not a candidate 3. Skin cancer screening- no dermatologist- no lesions she is concerned about 4. Cervical cancer screening- past age based screening 5. Breast cancer screening- up to date- gets yearly  ROS- No pertinent positives discovered in course of AWV Also see CPE  The following were reviewed and entered/updated in epic: Past Medical History:  Diagnosis Date  . Anxiety   . Arthritis   . CAD (coronary artery disease)   . Cataract   . Depression   . DISORDER, MENOPAUSAL NOS 03/20/2007   Qualifier: Diagnosis of   By: Arnoldo Morale MD, Balinda Quails   . GERD (gastroesophageal reflux disease)   . Hyperlipidemia   . Hypothyroidism   . MYOCARDIAL INFARCTION, HX OF 09/09/2007   Qualifier: Diagnosis of  By: Leanne Chang MD, Bruce    . Osteoporosis   . Polymyalgia rheumatica [facility])    Patient Active Problem List   Diagnosis Date Noted  . Polymyalgia rheumatica ([place]) 12/01/2010    Priority: High  . CAD (coronary artery disease) 09/09/2007    Priority: High  . Osteoarthritis of right hip 12/02/2017    Priority: Medium  . Osteoporosis 05/26/2014    Priority: Medium  . Depression 03/20/2007    Priority: Medium  . Hypothyroid 02/13/1999    Priority: Medium  . Polyp of colon, adenomatous 10/11/2014    Priority: Low  . Vitamin D deficiency 05/26/2014    Priority: Low  . Hematuria 09/09/2007    Priority: Low  . Myalgia 12/30/2018  . High risk medication use 12/04/2018  . Recurrent major depressive disorder, in full remission ([place]) 09/03/2018  . Lower esophageal ring ([place]) 03/03/2018  . GERD (gastroesophageal reflux disease) 12/15/2015   Past Surgical History:  Procedure Laterality Date  . CATARACT EXTRACTION    . CESAREAN SECTION    . CHOLECYSTECTOMY N/A 11/24/2012   Procedure: LAPAROSCOPIC CHOLECYSTECTOMY;  Surgeon: Ralene Ok, MD;  Location: [place];  Service: General;  Laterality: N/A;  . COSMETIC SURGERY    . EYE SURGERY     eye lids lifted  . PTCA      Family History  Problem Relation Age of Onset  . Hypertension Father   . Heart disease Father        Mi age 40, 61, 55 and 10.  . Cancer Father   . Hyperlipidemia Father   . Colon cancer Maternal Uncle   . Hypertension Mother   . Cancer Mother   . Hyperlipidemia Mother   . Pulmonary fibrosis Sister   . Epilepsy Son   . Healthy Son   . Healthy Son   . Rheum arthritis Niece   . Lupus Niece   . Rheum arthritis Niece     Medications- reviewed and updated Current Outpatient Medications  Medication Sig Dispense Refill  . alendronate  (FOSAMAX) 70 MG tablet TAKE 1 TABLET BY MOUTH EVERY 7 DAYS. TAKE ON EMPTY stomach WITH full GLASS OF WATER. stay upright. 13 tablet 2  . aspirin 81 MG EC tablet Take 81 mg by mouth daily.     . Evolocumab (REPATHA SURECLICK) XX123456 MG/ML SOAJ Inject 1 pen into the skin every 14 (fourteen) days. 2 pen 11  . folic acid (FOLVITE) 1 MG tablet Take 1 tablet (1 mg total) by mouth daily. 90 tablet 2  . levothyroxine (SYNTHROID) 112 MCG tablet Take 1 tablet (112 mcg total) by mouth daily. 90 tablet 3  . methotrexate (  RHEUMATREX) 2.5 MG tablet Take 4 tablets (10 mg) once a week on Friday evenings. Caution:Chemotherapy. Protect from light. 48 tablet 0  . pantoprazole (PROTONIX) 40 MG tablet Take 1 tablet (40 mg total) by mouth daily. 90 tablet 3  . PARoxetine (PAXIL) 20 MG tablet Take 1 tablet (20 mg total) by mouth every morning. 90 tablet 3  . predniSONE (DELTASONE) 1 MG tablet Take 4 tablets by mouth daily, follow taper as instructed. 120 tablet 1  . Vitamin D, Ergocalciferol, (DRISDOL) 1.25 MG (50000 UT) CAPS capsule Take 1 capsule (50,000 Units total) by mouth every 7 (seven) days. 12 capsule 0   No current facility-administered medications for this visit.     Allergies-reviewed and updated Allergies  Allergen Reactions  . Morphine And Related Nausea And Vomiting  . Prochlorperazine     REACTION: nerve reaction  . Simvastatin     REACTION: leg cramps  . Sulfa Antibiotics     Social History   Socioeconomic History  . Marital status: Married    Spouse name: Not on file  . Number of children: Not on file  . Years of education: Not on file  . Highest education level: Not on file  Occupational History  . Not on file  Social Needs  . Financial resource strain: Not on file  . Food insecurity    Worry: Not on file    Inability: Not on file  . Transportation needs    Medical: Not on file    Non-medical: Not on file  Tobacco Use  . Smoking status: Never Smoker  . Smokeless tobacco: Never  Used  Substance and Sexual Activity  . Alcohol use: No    Alcohol/week: 0.0 standard drinks  . Drug use: No  . Sexual activity: Not on file  Lifestyle  . Physical activity    Days per week: Not on file    Minutes per session: Not on file  . Stress: Not on file  Relationships  . Social Herbalist on phone: Not on file    Gets together: Not on file    Attends religious service: Not on file    Active member of club or organization: Not on file    Attends meetings of clubs or organizations: Not on file    Relationship status: Not on file  Other Topics Concern  . Not on file  Social History Narrative   Husband and 2 adult children live at home. No grandkids. Son with epilepsy.       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       Objective:  BP 128/82   Temp 98.6 F (37 C) (Oral)   Ht 5' 1.5" (1.562 m)   Wt 161 lb (73 kg)   SpO2 98%   BMI 29.93 kg/m  Gen: NAD, resting comfortably HEENT: Mucous membranes are moist. Oropharynx normal Neck: no thyromegaly CV: RRR no murmurs rubs or gallops Lungs: CTAB no crackles, wheeze, rhonchi Abdomen: soft/nontender/nondistended/normal bowel sounds. No rebound or guarding.  Ext: no edema Skin: warm, dry Neuro: grossly normal, moves all extremities, PERRLA   Assessment/Plan:  AWV completed- discussed recommended screenings and documented any personalized health advice and referrals for preventive counseling. See AVS as well which was given to patient.   Status of chronic or acute concerns  See CPE as well  Recommended follow up: 1 year AWVs.  Future Appointments  Date Time Provider [place]  01/27/2019  2:40 PM Hochrein,  Jeneen Rinks, MD [facility]  03/11/2019 10:40 AM Ofilia Neas, PA-C [facility] None   Lab/Order associations:   ICD-10-CM  1. Preventative health care  Z00.00    Return precautions advised.  Garret Reddish, MD

## 2019-01-01 NOTE — Progress Notes (Addendum)
Phone: [phone]   Subjective:  Patient presents today for their annual physical. Chief complaint-noted.   See problem oriented charting- Review of Systems  Constitutional: Negative.   HENT: Negative.   Eyes: Negative.   Respiratory: Negative.   Cardiovascular: Negative.   Gastrointestinal: Negative.   Genitourinary: Negative.   Musculoskeletal: Positive for back pain.       History of back pain followed by ortho.    Skin: Negative.   Neurological: Negative.   Endo/Heme/Allergies: Negative.   Psychiatric/Behavioral: Negative.    The following were reviewed and entered/updated in epic: Past Medical History:  Diagnosis Date  . Anxiety   . Arthritis   . CAD (coronary artery disease)   . Cataract   . Depression   . DISORDER, MENOPAUSAL NOS 03/20/2007   Qualifier: Diagnosis of  By: Arnoldo Morale MD, Balinda Quails   . GERD (gastroesophageal reflux disease)   . Hyperlipidemia   . Hypothyroidism   . MYOCARDIAL INFARCTION, HX OF 09/09/2007   Qualifier: Diagnosis of  By: Leanne Chang MD, Bruce    . Osteoporosis   . Polymyalgia rheumatica [facility])    Patient Active Problem List   Diagnosis Date Noted  . Polymyalgia rheumatica ([place]) 12/01/2010    Priority: High  . CAD (coronary artery disease) 09/09/2007    Priority: High  . Osteoarthritis of right hip 12/02/2017    Priority: Medium  . Osteoporosis 05/26/2014    Priority: Medium  . Depression 03/20/2007    Priority: Medium  . Hypothyroid 02/13/1999    Priority: Medium  . Polyp of colon, adenomatous 10/11/2014    Priority: Low  . Vitamin D deficiency 05/26/2014    Priority: Low  . Hematuria 09/09/2007    Priority: Low  . Myalgia 12/30/2018  . High risk medication use 12/04/2018  . Recurrent major depressive disorder, in full remission ([place]) 09/03/2018  . Lower esophageal ring ([place]) 03/03/2018  . GERD (gastroesophageal reflux disease) 12/15/2015   Past Surgical History:  Procedure Laterality Date  . CATARACT EXTRACTION    .  CESAREAN SECTION    . CHOLECYSTECTOMY N/A 11/24/2012   Procedure: LAPAROSCOPIC CHOLECYSTECTOMY;  Surgeon: Ralene Ok, MD;  Location: [place];  Service: General;  Laterality: N/A;  . COSMETIC SURGERY    . EYE SURGERY     eye lids lifted  . PTCA      Family History  Problem Relation Age of Onset  . Hypertension Father   . Heart disease Father        Mi age 7, 8, 47 and 22.  . Cancer Father   . Hyperlipidemia Father   . Colon cancer Maternal Uncle   . Hypertension Mother   . Cancer Mother   . Hyperlipidemia Mother   . Pulmonary fibrosis Sister   . Epilepsy Son   . Healthy Son   . Healthy Son   . Rheum arthritis Niece   . Lupus Niece   . Rheum arthritis Niece     Medications- reviewed and updated Current Outpatient Medications  Medication Sig Dispense Refill  . alendronate (FOSAMAX) 70 MG tablet TAKE 1 TABLET BY MOUTH EVERY 7 DAYS. TAKE ON EMPTY stomach WITH full GLASS OF WATER. stay upright. 13 tablet 2  . aspirin 81 MG EC tablet Take 81 mg by mouth daily.     . Evolocumab (REPATHA SURECLICK) [id number] MG/ML SOAJ Inject 1 pen into the skin every 14 (fourteen) days. 2 pen 11  . folic acid (FOLVITE) 1 MG tablet Take 1 tablet (1 mg total)  by mouth daily. 90 tablet 2  . levothyroxine (SYNTHROID) 112 MCG tablet Take 1 tablet (112 mcg total) by mouth daily. 90 tablet 3  . methotrexate (RHEUMATREX) 2.5 MG tablet Take 4 tablets (10 mg) once a week on Friday evenings. Caution:Chemotherapy. Protect from light. 48 tablet 0  . pantoprazole (PROTONIX) 40 MG tablet Take 1 tablet (40 mg total) by mouth daily. 90 tablet 3  . PARoxetine (PAXIL) 20 MG tablet Take 1 tablet (20 mg total) by mouth every morning. 90 tablet 3  . predniSONE (DELTASONE) 1 MG tablet Take 4 tablets by mouth daily, follow taper as instructed. 120 tablet 1  . Vitamin D, Ergocalciferol, (DRISDOL) 1.25 MG (50000 UT) CAPS capsule Take 1 capsule (50,000 Units total) by mouth every 7 (seven) days. 12 capsule 0   No current  facility-administered medications for this visit.     Allergies-reviewed and updated Allergies  Allergen Reactions  . Morphine And Related Nausea And Vomiting  . Prochlorperazine     REACTION: nerve reaction  . Simvastatin     REACTION: leg cramps  . Sulfa Antibiotics     Social History   Social History Narrative   Husband and 2 adult children live at home. No grandkids. Son with epilepsy.       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    Objective  Objective:  BP 128/82   Temp 98.6 F (37 C) (Oral)   Ht 5' 1.5" (1.562 m)   Wt 161 lb (Meghan kg)   SpO2 98%   BMI 29.93 kg/m  Gen: NAD, resting comfortably HEENT: Mask not removed due to covid 19. TM normal. Bridge of nose normal. Eyelids normal.  Neck: no thyromegaly or cervical lymphadenopathy  CV: RRR no murmurs rubs or gallops Lungs: CTAB no crackles, wheeze, rhonchi Abdomen: soft/nontender/nondistended/normal bowel sounds. No rebound or guarding.  Ext: no edema Skin: warm, dry Neuro: grossly normal, moves all extremities, PERRLA   Assessment and Plan   68 y.o. [name] presenting for annual physical.  Health Maintenance counseling: 1. Anticipatory guidance: Patient counseled regarding regular dental exams q6 months, eye exams- yearly ,  avoiding smoking and second hand smoke , limiting alcohol to 1 beverage per day . Patient does not drink at all.    2. Risk factor reduction:  Advised patient of need for regular exercise and diet rich and fruits and vegetables to reduce risk of heart attack and stroke. 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 Diet- working on healthy eating and portion control- has had slight weight loss Wt Readings from Last 3 Encounters:  01/01/19 161 lb (Meghan kg)  12/10/18 162 lb (Meghan.5 kg)  09/11/18 167 lb (75.8 kg)  3. Immunizations/screenings/ancillary studies-  Immunization History   Administered Date(s) Administered  . Influenza Whole 11/09/2008  . Influenza, High Dose Seasonal PF 10/30/2016, 12/02/2017, 11/08/2018  . Influenza-Unspecified 11/08/2018  . Pneumococcal Conjugate-13 10/11/2014  . Pneumococcal Polysaccharide-23 12/15/2015  . Td 02/12/2006  . Tdap 04/26/2017  . Zoster 09/19/2010    4. Cervical cancer screening- past aged based screening. Never had abnormal pap smear.  5. Breast cancer screening-  breast exam done monthly at home and mammogram done yearly last one on 07/25/2018.  6. Colon cancer screening - Current next due 07/23/2019 due to history of adenomatous colon polyp  7. Skin cancer screening-  Never seen by dermatology. advised regular  sunscreen use. Denies worrisome, changing, or new skin lesions.  8. Birth control/STD check- postmenopausal/monogomous 9. Osteoporosis screening at 73- up to date last exam on 06/04/2017.see discussion belo  -Never smoker  Status of chronic or acute concerns   Polymyalgia rheumatica (HCC)-stable-working with rheumatology to try to transition off prednisone in the long run- down to 4 mg while also taking methotrexate.    Coronary artery disease involving native coronary artery of native heart without angina pectoris/hyperlipidemia/myalgia drug induced-statin intolerant -on Repatha-statin intolerant.  Compliant with aspirin.  Follows with Dr. Percival Spanish  Age-related osteoporosis without current pathological fracture-high risk with long-term prednisone use.  Patient on calcium/vitamin D/Fosamax.  She has been on Fosamax since 2015.  We are going to continue Fosamax for now-we may stop if we can get her off of prednisone through rheumatology -try to get off fosamax if can get off prednisone   Primary osteoarthritis of right hip- just had cortisone shot 3 weeks ago   Hypothyroidism due to acquired atrophy of thyroid-on levothyroxine 112 mcg, update TSH today  Vitamin D deficiency-history of low vitamin D.  Currently on  high-dose vitamin D-check in the future when not on vitamin D supplement  Recurrent major depressive disorder, in full remission (HCC)-remains controlled on paxil 20 mg   Gastroesophageal reflux disease without esophagitis with history of Lower esophageal ring ([place]) with 2 dilations-patient failed H2 blocker in the past.  On long-term PPI.  Update B12 level today with low normal value in the past. Starting to have trouble eating meat- she is thinking about calling GI for follow up.   Lab Results  Component Value Date   VITAMINB12 270 07/29/2017     STRONGLY recommend follow-up with urology from CT scan for 05/02/2018-"new mild retroperitoneal soft tissue stranding and subcentimeter lymph nodes mainly in the aortocaval and retrocaval spaces."  2 to 77-month follow-up recommended.  Recommended follow up:  6 month follow up  Future Appointments  Date Time Provider [place]  01/27/2019  2:40 PM Minus Breeding, MD [facility]  03/11/2019 10:40 AM Ofilia Neas, PA-C [facility] None   Lab/Order associations: Not fasting. Had Lipid done at cardiology today but will need other labs here today.    ICD-10-CM   1. Preventative health care  Z00.00 TSH    CBC with Differential/Platelet    Comprehensive metabolic panel    Vitamin B12    Hemoglobin A1c  2. Polymyalgia rheumatica (HCC)  M35.3   3. Coronary artery disease involving native coronary artery of native heart without angina pectoris  I25.10 CBC with Differential/Platelet    Comprehensive metabolic panel  4. Age-related osteoporosis without current pathological fracture  M81.0   5. Primary osteoarthritis of right hip  M16.11   6. Hypothyroidism due to acquired atrophy of thyroid  E03.4 TSH  7. Vitamin D deficiency  E55.9   8. Adenomatous polyp of colon, unspecified part of colon  D12.6   9. Recurrent major depressive disorder, in full remission ([place])  F33.42   10. Myalgia  M79.10   11. Gastroesophageal reflux disease  without esophagitis  K21.9   12. Lower esophageal ring ([place])  K22.2   13. High risk medication use  Z79.899 Vitamin B12  14. Hyperglycemia  R73.9 Hemoglobin A1c  15. Drug-induced myopathy  G72.0     Meds ordered this encounter  Medications  . PARoxetine (PAXIL) 20 MG tablet    Sig: Take 1 tablet (20 mg total) by mouth every morning.    Dispense:  90  tablet    Refill:  3  . pantoprazole (PROTONIX) 40 MG tablet    Sig: Take 1 tablet (40 mg total) by mouth daily.    Dispense:  90 tablet    Refill:  3  . alendronate (FOSAMAX) 70 MG tablet    Sig: TAKE 1 TABLET BY MOUTH EVERY 7 DAYS. TAKE ON EMPTY stomach WITH full GLASS OF WATER. stay upright.    Dispense:  13 tablet    Refill:  2  . levothyroxine (SYNTHROID) 112 MCG tablet    Sig: Take 1 tablet (112 mcg total) by mouth daily.    Dispense:  90 tablet    Refill:  3    This prescription was filled on 07/08/2018. Any refills authorized will be placed on file.    Return precautions advised.  Garret Reddish, MD

## 2019-01-01 NOTE — Patient Instructions (Addendum)
Recommended follow up:  6 month follow up   Please check with your pharmacy to see if they have the shingrix vaccine. If they do- please get this immunization and update [place] by phone call or mychart with dates you receive the vaccine  Team please give blue packet with advanced directive information- she is going to start thinking about this and may get lawyer if needed or use Internet tools.    Please stop by lab before you go If you do not have mychart- we will call you about results within 5 business days of [place] receiving them.  If you have mychart- we will send your results within 3 business days of [place] receiving them.  If abnormal or we want to clarify a result, we will call or mychart you to make sure you receive the message.  If you have questions or concerns or don't hear within 5-7 days, please send [place] a message or call us.     [name] , Thank you for taking time to come for your Medicare Wellness Visit. I appreciate your ongoing commitment to your health goals. Please review the following plan we discussed and let me know if I can assist you in the future.   These are the goals we discussed: 1. Start walking on weekends   This is a list of the screening recommended for you and due dates:  Health Maintenance  Topic Date Due  . Colon Cancer Screening  07/23/2019  . Mammogram  07/24/2020  . Tetanus Vaccine  04/27/2027  . Flu Shot  Completed  . DEXA scan (bone density measurement)  Completed  .  Hepatitis C: One time screening is recommended by Center for Disease Control  (CDC) for  adults born from [birth year] through 1966.   Completed  . Pneumonia vaccines  Completed

## 2019-01-02 LAB — COMPREHENSIVE METABOLIC PANEL
ALT: 25 U/L (ref 0–35)
AST: 19 U/L (ref 0–37)
Albumin: 3.7 g/dL (ref 3.5–5.2)
Alkaline Phosphatase: 102 U/L (ref 39–117)
BUN: 17 mg/dL (ref 6–23)
CO2: 29 mEq/L (ref 19–32)
Calcium: 9 mg/dL (ref 8.4–10.5)
Chloride: 104 mEq/L (ref 96–112)
Creatinine, Ser: 0.91 mg/dL (ref 0.40–1.20)
GFR: 61.23 mL/min (ref >60.00)
Glucose, Bld: 115 mg/dL — ABNORMAL HIGH (ref 70–99)
Potassium: 3.4 mEq/L — ABNORMAL LOW (ref 3.5–5.1)
Sodium: 141 mEq/L (ref 135–145)
Total Bilirubin: 0.4 mg/dL (ref 0.2–1.2)
Total Protein: 5.9 g/dL — ABNORMAL LOW (ref 6.0–8.3)

## 2019-01-02 LAB — CBC WITH DIFFERENTIAL/PLATELET
Basophils Absolute: 0.1 10*3/uL (ref 0.0–0.1)
Basophils Relative: 0.8 % (ref 0.0–3.0)
Eosinophils Absolute: 0.3 10*3/uL (ref 0.0–0.7)
Eosinophils Relative: 3.5 % (ref 0.0–5.0)
HCT: 41.1 % (ref 36.0–46.0)
Hemoglobin: 13.2 g/dL (ref 12.0–15.0)
Lymphocytes Relative: 17 % (ref 12.0–46.0)
Lymphs Abs: 1.5 10*3/uL (ref 0.7–4.0)
MCHC: 32.1 g/dL (ref 30.0–36.0)
MCV: 95.4 fl (ref 78.0–100.0)
Monocytes Absolute: 0.6 10*3/uL (ref 0.1–1.0)
Monocytes Relative: 7.4 % (ref 3.0–12.0)
Neutro Abs: 6.3 10*3/uL (ref 1.4–7.7)
Neutrophils Relative %: 71.3 % (ref 43.0–77.0)
Platelets: 237 10*3/uL (ref 150.0–400.0)
RBC: 4.31 Mil/uL (ref 3.87–5.11)
RDW: 16.1 % — ABNORMAL HIGH (ref 11.5–15.5)
WBC: 8.8 10*3/uL (ref 4.0–10.5)

## 2019-01-02 LAB — VITAMIN B12: Vitamin B-12: 199 pg/mL — ABNORMAL LOW (ref 211–911)

## 2019-01-02 LAB — HEMOGLOBIN A1C: Hgb A1c MFr Bld: 6 % (ref 4.6–6.5)

## 2019-01-02 LAB — TSH: TSH: 0.18 u[IU]/mL — ABNORMAL LOW (ref 0.35–4.50)

## 2019-01-05 ENCOUNTER — Ambulatory Visit (INDEPENDENT_AMBULATORY_CARE_PROVIDER_SITE_OTHER): Payer: PPO | Admitting: Podiatry

## 2019-01-05 ENCOUNTER — Ambulatory Visit (INDEPENDENT_AMBULATORY_CARE_PROVIDER_SITE_OTHER): Payer: PPO

## 2019-01-05 ENCOUNTER — Other Ambulatory Visit: Payer: Self-pay | Admitting: Podiatry

## 2019-01-05 ENCOUNTER — Other Ambulatory Visit: Payer: Self-pay

## 2019-01-05 ENCOUNTER — Encounter: Payer: Self-pay | Admitting: Family Medicine

## 2019-01-05 DIAGNOSIS — M9260 Juvenile osteochondrosis of tarsus, unspecified ankle: Secondary | ICD-10-CM | POA: Diagnosis not present

## 2019-01-05 DIAGNOSIS — E538 Deficiency of other specified B group vitamins: Secondary | ICD-10-CM

## 2019-01-05 DIAGNOSIS — E034 Atrophy of thyroid (acquired): Secondary | ICD-10-CM

## 2019-01-05 DIAGNOSIS — M79671 Pain in right foot: Secondary | ICD-10-CM

## 2019-01-05 DIAGNOSIS — M779 Enthesopathy, unspecified: Secondary | ICD-10-CM | POA: Diagnosis not present

## 2019-01-05 DIAGNOSIS — M7732 Calcaneal spur, left foot: Secondary | ICD-10-CM

## 2019-01-05 MED ORDER — CYANOCOBALAMIN 1000 MCG/ML IJ SOLN: INTRAMUSCULAR | 0 refills | Status: DC

## 2019-01-05 MED ORDER — METHYLPREDNISOLONE 4 MG PO TBPK: ORAL_TABLET | ORAL | 0 refills | Status: DC

## 2019-01-05 MED ORDER — "SYRINGE 25G X 1"" 3 ML MISC": 1.0000 "application " | 0 refills | Status: DC

## 2019-01-05 MED ORDER — LEVOTHYROXINE SODIUM 100 MCG PO TABS: 100.0000 ug | ORAL_TABLET | Freq: Every day | ORAL | 3 refills | Status: DC

## 2019-01-05 MED ORDER — DICLOFENAC SODIUM 1 % EX GEL: 2.0000 g | Freq: Four times a day (QID) | CUTANEOUS | 2 refills | Status: DC

## 2019-01-05 NOTE — Patient Instructions (Signed)

## 2019-01-06 ENCOUNTER — Telehealth: Payer: Self-pay | Admitting: *Deleted

## 2019-01-06 NOTE — Telephone Encounter (Signed)
Left message for patient to call and schedule LIPID clinic appt with Pharm D per 01/05/19 staff message from Veronda Prude, LPN

## 2019-01-12 DIAGNOSIS — M9260 Juvenile osteochondrosis of tarsus, unspecified ankle: Secondary | ICD-10-CM | POA: Insufficient documentation

## 2019-01-12 NOTE — Progress Notes (Signed)
Subjective: 68 year old female presents the office today for concerns of pain to the back of the right heel which is been ongoing for last 7 months.  She did follow-up with orthopedics for this and she was told that she had a Haglund's deformity.  She was given a 10-day course of prednisone which was helpful.  She said that she has been doing well however over the weekend she noticed increased pain to the area and had difficulty walking so therefore she put walking boot on which is been helpful somewhat.  She denies any recent injury.  She gets swelling to the back of the heel. Denies any systemic complaints such as fevers, chills, nausea, vomiting. No acute changes since last appointment, and no other complaints at this time.   Objective: AAO x3, NAD DP/PT pulses palpable bilaterally, CRT less than 3 seconds Prominent Haglund's deformities present the posterior aspect of the right side and there is a bursa present with mild erythema marked rub inside shoes but there is no ascending cellulitis or open sores.  There is no tenderness on the Achilles tendon itself and Thompson test is negative.  No pain with lateral compression of calcaneus.  There is minimal tenderness on the plantar aspect of the calcaneus insertion of plantar fascia. No open lesions or pre-ulcerative lesions.  No pain with calf compression, swelling, warmth, erythema  Assessment: 68 year old female posterior heel spur, Haglund's deformity  Plan: -All treatment options discussed with the patient including all alternatives, risks, complications.  -X-rays obtained and reviewed.  Calcaneal spurring present the posterior aspect of the heel.  No evidence of acute fracture. -Prescribed a Medrol Dosepak.  Also once this is complete to direct her normal dose of prednisone.  Also Voltaren gel.  Continue the cam boot I added a heel lift.  Also gave her heel lift to wear instead of her regular shoe.  Offloading gel pads were also dispensed.  As  she starts to feel better we discussed stretching exercises at rehab I want her to hold off until she is feeling better.  If no improvement will order MRI. -Patient encouraged to call the office with any questions, concerns, change in symptoms.   Trula Slade DPM

## 2019-01-13 ENCOUNTER — Other Ambulatory Visit: Payer: Self-pay | Admitting: Rheumatology

## 2019-01-14 DIAGNOSIS — G720 Drug-induced myopathy: Secondary | ICD-10-CM | POA: Insufficient documentation

## 2019-01-14 NOTE — Assessment & Plan Note (Signed)
Statin intolerant- on repatha instead.

## 2019-01-22 ENCOUNTER — Ambulatory Visit: Payer: PPO | Admitting: Cardiology

## 2019-01-26 DIAGNOSIS — Z7189 Other specified counseling: Secondary | ICD-10-CM | POA: Insufficient documentation

## 2019-01-26 DIAGNOSIS — E785 Hyperlipidemia, unspecified: Secondary | ICD-10-CM | POA: Insufficient documentation

## 2019-01-26 NOTE — Progress Notes (Signed)
Cardiology Office Note   Date:  01/27/2019   ID:  [name], DOB 1950-08-11, MRN [id number]  PCP:  [name], MD  Cardiologist:   Minus Breeding, MD  Referring:  [name], MD   No chief complaint on file.    History of Present Illness: [name] is a 68 y.o. female who presents for follow-up of coronary disease. He's been several years since she was seen. She had a marginal branch myocardial infarction in 2009. There was a long 99% stenosis which was reduced to 50% stenosis with dissection and TIMI-3 flow. She was managed medically this and has done well. She is referred back to [place] because it's been so long. Of note over the years she has been intolerant of 4 statins.  Since I last saw her she was started on Repatha.  She has a broken foot because her dog landed on it. The patient denies any new symptoms such as chest discomfort, neck or arm discomfort. There has been no new shortness of breath, PND or orthopnea. There have been no reported palpitations, presyncope or syncope.     Past Medical History:  Diagnosis Date  . Anxiety   . Arthritis   . CAD (coronary artery disease)   . Cataract   . Depression   . DISORDER, MENOPAUSAL NOS 03/20/2007   Qualifier: Diagnosis of  By: Arnoldo Morale MD, Balinda Quails   . GERD (gastroesophageal reflux disease)   . Hyperlipidemia   . Hypothyroidism   . MYOCARDIAL INFARCTION, HX OF 09/09/2007   Qualifier: Diagnosis of  By: Leanne Chang MD, Bruce    . Osteoporosis   . Polymyalgia rheumatica (HCC)     Past Surgical History:  Procedure Laterality Date  . CATARACT EXTRACTION    . CESAREAN SECTION    . CHOLECYSTECTOMY N/A 11/24/2012   Procedure: LAPAROSCOPIC CHOLECYSTECTOMY;  Surgeon: Ralene Ok, MD;  Location: [facility];  Service: General;  Laterality: N/A;  . COSMETIC SURGERY    . EYE SURGERY     eye lids lifted  . PTCA       Current Outpatient Medications  Medication Sig Dispense Refill  . alendronate (FOSAMAX) 70 MG  tablet TAKE 1 TABLET BY MOUTH EVERY 7 DAYS. TAKE ON EMPTY stomach WITH full GLASS OF WATER. stay upright. 13 tablet 2  . aspirin 81 MG EC tablet Take 81 mg by mouth daily.     . cyanocobalamin (,VITAMIN B-12,) 1000 MCG/ML injection 1000 mcg (1 mg) injection once per week for four weeks, 4 mL 0  . diclofenac Sodium (VOLTAREN) 1 % GEL Apply 2 g topically 4 (four) times daily. Rub into affected area of foot 2 to 4 times daily 100 g 2  . Evolocumab (REPATHA SURECLICK) [id number] MG/ML SOAJ Inject 1 pen into the skin every 14 (fourteen) days. 2 pen 11  . folic acid (FOLVITE) 1 MG tablet Take 1 tablet (1 mg total) by mouth daily. 90 tablet 2  . levothyroxine (SYNTHROID) 100 MCG tablet Take 1 tablet (100 mcg total) by mouth daily. 90 tablet 3  . methotrexate (RHEUMATREX) 2.5 MG tablet Take 4 tablets (10 mg) once a week on Friday evenings. Caution:Chemotherapy. Protect from light. 48 tablet 0  . methylPREDNISolone (MEDROL DOSEPAK) 4 MG TBPK tablet Take as directed 21 tablet 0  . pantoprazole (PROTONIX) 40 MG tablet Take 1 tablet (40 mg total) by mouth daily. 90 tablet 3  . PARoxetine (PAXIL) 20 MG tablet Take 1 tablet (20 mg total)  by mouth every morning. 90 tablet 3  . predniSONE (DELTASONE) 1 MG tablet Take 4 tablets by mouth daily, follow taper as instructed. 120 tablet 1  . Syringe/Needle, Disp, (SYRINGE 3CC/25GX1") 25G X 1" 3 ML MISC 1 application by Does not apply route once a week. 4 each 0  . Vitamin D, Ergocalciferol, (DRISDOL) 1.25 MG (50000 UT) CAPS capsule Take 1 capsule (50,000 Units total) by mouth every 7 (seven) days. 12 capsule 0   No current facility-administered medications for this visit.    Allergies:   Morphine and related, Prochlorperazine, Simvastatin, and Sulfa antibiotics    ROS:  Please see the history of present illness.   Otherwise, review of systems are positive for none.   All other systems are reviewed and negative.    PHYSICAL EXAM: VS:  BP 132/82   Pulse 76   Ht 5\' 1"   (1.549 m)   Wt 161 lb 12.8 oz (73.4 kg)   BMI 30.57 kg/m  , BMI Body mass index is 30.57 kg/m. GENERAL:  Well appearing NECK:  No jugular venous distention, waveform within normal limits, carotid upstroke brisk and symmetric, no bruits, no thyromegaly LUNGS:  Clear to auscultation bilaterally CHEST:  Unremarkable HEART:  PMI not displaced or sustained,S1 and S2 within normal limits, no S3, no S4, no clicks, no rubs, no murmurs ABD:  Flat, positive bowel sounds normal in frequency in pitch, no bruits, no rebound, no guarding, no midline pulsatile mass, no hepatomegaly, no splenomegaly EXT:  2 plus pulses throughout, no edema, no cyanosis no clubbing      EKG:  EKG is ordered today. The ekg ordered today demonstrates sinus rhythm, rate 76, left axis deviation, low voltage in the chest leads, poor anterior R wave progression, nonspecific T-wave flattening.  PVCs  Recent Labs: 01/01/2019: ALT 25; BUN 17; Creatinine, Ser 0.91; Hemoglobin 13.2; Platelets 237.0; Potassium 3.4; Sodium 141; TSH 0.18    Lipid Panel    Component Value Date/Time   CHOL 266 (H) 01/01/2019 1010   TRIG 57 01/01/2019 1010   HDL 82 01/01/2019 1010   CHOLHDL 3.2 01/01/2019 1010   CHOLHDL 4 07/29/2017 1430   VLDL 16.8 07/29/2017 1430   LDLCALC 175 (H) 01/01/2019 1010   LDLDIRECT 176.0 12/15/2015 1103      Wt Readings from Last 3 Encounters:  01/27/19 161 lb 12.8 oz (73.4 kg)  01/01/19 161 lb (73 kg)  12/10/18 162 lb (73.5 kg)      Other studies Reviewed: Additional studies/ records that were reviewed today include: Labs Review of the above records demonstrates:  See elsewhere   ASSESSMENT AND PLAN:   CAD:  She had a POET (Plain Old Exercise Treadmill) .  She had no further symptoms.  No further cardiovascular testing.  DYSLIPIDEMIA:  She is intolerant of statins.  She was started on Repatha.  However, she has been out of it 2 months which explains why her LDL is elevated as above.  I contacted our  [facility] to find out when we can expect her to approved again for this medicine.  Following this we would get a lipid level.   COVID EDUCATION: She is eager to get the vaccine.  Current medicines are reviewed at length with the patient today.  The patient does not have concerns regarding medicines.  The following changes have been made:  None  Labs/ tests ordered today include: None  No orders of the defined types were placed in this encounter.  Disposition:   FU with me in one year.     Signed, Minus Breeding, MD  01/27/2019 3:29 PM    [facility]

## 2019-01-27 ENCOUNTER — Ambulatory Visit: Payer: PPO | Admitting: Cardiology

## 2019-01-27 ENCOUNTER — Encounter: Payer: Self-pay | Admitting: Cardiology

## 2019-01-27 ENCOUNTER — Other Ambulatory Visit: Payer: Self-pay

## 2019-01-27 VITALS — BP 132/82 | HR 76 | Ht 61.0 in | Wt 161.8 lb

## 2019-01-27 DIAGNOSIS — I2510 Atherosclerotic heart disease of native coronary artery without angina pectoris: Secondary | ICD-10-CM | POA: Diagnosis not present

## 2019-01-27 DIAGNOSIS — E785 Hyperlipidemia, unspecified: Secondary | ICD-10-CM | POA: Diagnosis not present

## 2019-01-27 DIAGNOSIS — Z7189 Other specified counseling: Secondary | ICD-10-CM | POA: Diagnosis not present

## 2019-01-27 NOTE — Patient Instructions (Signed)
Medication Instructions:  Your physician recommends that you continue on your current medications as directed. Please refer to the Current Medication list given to you today.  *If you need a refill on your cardiac medications before your next appointment, please call your pharmacy*  Lab Work: NONE If you have labs (blood work) drawn today and your tests are completely normal, you will receive your results only by: Marland Kitchen MyChart Message (if you have MyChart) OR . A paper copy in the mail If you have any lab test that is abnormal or we need to change your treatment, we will call you to review the results.  Testing/Procedures: NONE  Follow-Up: At [facility], you and your health needs are our priority.  As part of our continuing mission to provide you with exceptional heart care, we have created designated Provider Care Teams.  These Care Teams include your primary Cardiologist (physician) and Advanced Practice Providers (APPs -  Physician Assistants and Nurse Practitioners) who all work together to provide you with the care you need, when you need it.  Your next appointment:   12 month(s)  The format for your next appointment:   Either In Person or Virtual  Provider:   DR. Percival Spanish

## 2019-01-28 ENCOUNTER — Telehealth: Payer: Self-pay

## 2019-01-28 MED ORDER — REPATHA SURECLICK 140 MG/ML ~~LOC~~ SOAJ: 1.0000 "pen " | SUBCUTANEOUS | 11 refills | Status: DC

## 2019-01-28 NOTE — Addendum Note (Signed)
Addended by: Allean Found on: 01/28/2019 01:58 PM   Modules accepted: Orders

## 2019-01-28 NOTE — Telephone Encounter (Signed)
lmom to apply for [employer] since [employer] snf denied repatha free from the manufacturer

## 2019-01-28 NOTE — Telephone Encounter (Signed)
Returned a call to the pt and helped them apply and get approved to the [employer] for a grant of $2500. rx sent and they received the confirmation email. Pt voiced understanding

## 2019-02-03 ENCOUNTER — Other Ambulatory Visit (INDEPENDENT_AMBULATORY_CARE_PROVIDER_SITE_OTHER): Payer: Self-pay | Admitting: Orthopaedic Surgery

## 2019-02-03 NOTE — Telephone Encounter (Signed)
Please advise 

## 2019-02-03 NOTE — Telephone Encounter (Signed)
I called her. Likely stopped due to cardiac problems in past with stent etc.  She said she would stick with tylenol

## 2019-02-18 ENCOUNTER — Other Ambulatory Visit: Payer: Self-pay | Admitting: *Deleted

## 2019-02-18 ENCOUNTER — Telehealth: Payer: Self-pay | Admitting: Rheumatology

## 2019-02-18 DIAGNOSIS — E559 Vitamin D deficiency, unspecified: Secondary | ICD-10-CM

## 2019-02-18 DIAGNOSIS — M353 Polymyalgia rheumatica: Secondary | ICD-10-CM

## 2019-02-18 NOTE — Telephone Encounter (Signed)
#  1.Patient would like to get Dr. Estanislado Pandy to add on Vit D level to lab orders if possible.  #2.Patient going to [facility] for Labs. Please release orders.

## 2019-02-18 NOTE — Telephone Encounter (Signed)
1. Patient is due for a Vitamin D.  2. Lab Orders released to [facility].

## 2019-02-19 ENCOUNTER — Other Ambulatory Visit: Payer: Self-pay | Admitting: Rheumatology

## 2019-02-19 ENCOUNTER — Telehealth: Payer: Self-pay | Admitting: Rheumatology

## 2019-02-19 DIAGNOSIS — M353 Polymyalgia rheumatica: Secondary | ICD-10-CM

## 2019-02-19 DIAGNOSIS — E559 Vitamin D deficiency, unspecified: Secondary | ICD-10-CM | POA: Diagnosis not present

## 2019-02-19 MED ORDER — PREDNISONE 1 MG PO TABS: ORAL_TABLET | ORAL | 0 refills | Status: DC

## 2019-02-19 MED ORDER — METHOTREXATE 2.5 MG PO TABS: ORAL_TABLET | ORAL | 0 refills | Status: DC

## 2019-02-19 NOTE — Telephone Encounter (Signed)
Patient needs a refill on MTX, and Prednisone sent to [facility]. Patient out of MTX, and due for dose tomorrow. Patient had labs draw today.

## 2019-02-19 NOTE — Telephone Encounter (Signed)
Last Visit: 12/10/2018 Next Visit: 03/11/2019 Labs: 01/01/2019 potassium 3.4, glucose 115, total protein 5.9, RDW 16.1  Spoke with patient and patient is on 3mg  of prednisone daily and will continue on 3mg  through the end of January.   Okay to refill per Dr. Estanislado Pandy.

## 2019-02-20 LAB — COMPLETE METABOLIC PANEL WITH GFR
AG Ratio: 1.8 (calc) (ref 1.0–2.5)
ALT: 16 U/L (ref 6–29)
AST: 17 U/L (ref 10–35)
Albumin: 3.8 g/dL (ref 3.6–5.1)
Alkaline phosphatase (APISO): 94 U/L (ref 37–153)
BUN: 14 mg/dL (ref 7–25)
CO2: 28 mmol/L (ref 20–32)
Calcium: 9.1 mg/dL (ref 8.6–10.4)
Chloride: 106 mmol/L (ref 98–110)
Creat: 0.9 mg/dL (ref 0.50–0.99)
GFR, Est African American: 76 mL/min/{1.73_m2} (ref >=60)
GFR, Est Non African American: 65 mL/min/{1.73_m2} (ref >=60)
Globulin: 2.1 g/dL (calc) (ref 1.9–3.7)
Glucose, Bld: 126 mg/dL — ABNORMAL HIGH (ref 65–99)
Potassium: 3.7 mmol/L (ref 3.5–5.3)
Sodium: 141 mmol/L (ref 135–146)
Total Bilirubin: 0.6 mg/dL (ref 0.2–1.2)
Total Protein: 5.9 g/dL — ABNORMAL LOW (ref 6.1–8.1)

## 2019-02-20 LAB — CBC WITH DIFFERENTIAL/PLATELET
Absolute Monocytes: 422 cells/uL (ref 200–950)
Basophils Absolute: 59 cells/uL (ref 0–200)
Basophils Relative: 0.9 %
Eosinophils Absolute: 231 cells/uL (ref 15–500)
Eosinophils Relative: 3.5 %
HCT: 40.9 % (ref 35.0–45.0)
Hemoglobin: 13.4 g/dL (ref 11.7–15.5)
Lymphs Abs: 1063 cells/uL (ref 850–3900)
MCH: 30.5 pg (ref 27.0–33.0)
MCHC: 32.8 g/dL (ref 32.0–36.0)
MCV: 93.2 fL (ref 80.0–100.0)
MPV: 11.3 fL (ref 7.5–12.5)
Monocytes Relative: 6.4 %
Neutro Abs: 4825 cells/uL (ref 1500–7800)
Neutrophils Relative %: 73.1 %
Platelets: 246 10*3/uL (ref 140–400)
RBC: 4.39 10*6/uL (ref 3.80–5.10)
RDW: 13.7 % (ref 11.0–15.0)
Total Lymphocyte: 16.1 %
WBC: 6.6 10*3/uL (ref 3.8–10.8)

## 2019-02-20 LAB — VITAMIN D 25 HYDROXY (VIT D DEFICIENCY, FRACTURES): Vit D, 25-Hydroxy: 55 ng/mL (ref 30–100)

## 2019-02-23 ENCOUNTER — Ambulatory Visit: Payer: PPO

## 2019-02-25 ENCOUNTER — Ambulatory Visit: Payer: PPO | Admitting: Surgery

## 2019-03-03 ENCOUNTER — Telehealth: Payer: Self-pay

## 2019-03-03 NOTE — Telephone Encounter (Signed)
lmomed that the prior auth is good for the repatha and call the pharmacy to pick up meds

## 2019-03-05 ENCOUNTER — Telehealth: Payer: Self-pay | Admitting: Family Medicine

## 2019-03-05 NOTE — Telephone Encounter (Signed)
error 

## 2019-03-05 NOTE — Progress Notes (Signed)
Office Visit Note  Patient: [name]             Date of Birth: 1950/08/24           MRN: [id number]             PCP: [name], MD Referring: [name], MD Visit Date: 03/09/2019 Occupation: @GUAROCC @  Subjective:  Left trochanteric bursitis   History of Present Illness: JERELEAN GLOSSON is a 69 y.o. female with history of PMR, osteoarthritis, osteoporosis, and DDD. She is taking methotrexate 4 tablet po once weekly and folic acid 1 mg po daily. She has not missed any doses recently. She is on prednisone 3 mg po daily and is tapering by 1 mg every month.  She denies any signs or symptoms of a PMR flare.  She denies any muscle weakness or muscle tenderness at this time. She has no difficulty getting up from a seated position or raising her arms above her head. She states she has noticed that the discomfort related to left trochanteric bursitis is returning since having the cortisone injection performed on 12/10/18.  She continues to have chronic lower back pain in the lumbar region.  She denies any symptoms of radiculopathy.     Activities of Daily Living:  Patient reports morning stiffness for 30 minutes.   Patient Reports nocturnal pain.  Difficulty dressing/grooming: Denies Difficulty climbing stairs: Denies Difficulty getting out of chair: Denies Difficulty using hands for taps, buttons, cutlery, and/or writing: Denies  Review of Systems  Constitutional: Positive for fatigue.  HENT: Negative for mouth sores, mouth dryness and nose dryness.   Eyes: Negative for pain, itching, visual disturbance and dryness.  Respiratory: Negative for cough, hemoptysis, shortness of breath, wheezing and difficulty breathing.   Cardiovascular: Negative for chest pain, palpitations, hypertension and swelling in legs/feet.  Gastrointestinal: Negative for blood in stool, constipation and diarrhea.  Endocrine: Negative for increased urination.  Genitourinary: Negative for  difficulty urinating and painful urination.  Musculoskeletal: Positive for arthralgias, joint pain and morning stiffness. Negative for joint swelling, myalgias, muscle weakness, muscle tenderness and myalgias.  Skin: Negative for color change, pallor, rash, hair loss, nodules/bumps, skin tightness, ulcers and sensitivity to sunlight.  Allergic/Immunologic: Negative for susceptible to infections.  Neurological: Negative for dizziness, numbness, headaches, memory loss and weakness.  Hematological: Negative for swollen glands.  Psychiatric/Behavioral: Negative for depressed mood, confusion and sleep disturbance. The patient is not nervous/anxious.     PMFS History:  Patient Active Problem List   Diagnosis Date Noted   Educated about COVID-19 virus infection 01/26/2019   Dyslipidemia 01/26/2019   Drug-induced myopathy 01/14/2019   Haglund's deformity 01/12/2019   Myalgia 12/30/2018   High risk medication use 12/04/2018   Recurrent major depressive disorder, in full remission ([place]) 09/03/2018   Lower esophageal ring ([place]) 03/03/2018   Osteoarthritis of right hip 12/02/2017   GERD (gastroesophageal reflux disease) 12/15/2015   Polyp of colon, adenomatous 10/11/2014   Vitamin D deficiency 05/26/2014   Osteoporosis 05/26/2014   Polymyalgia rheumatica ([place]) 12/01/2010   CAD (coronary artery disease) 09/09/2007   Hematuria 09/09/2007   Depression 03/20/2007   Hypothyroid 02/13/1999    Past Medical History:  Diagnosis Date   Anxiety    Arthritis    CAD (coronary artery disease)    Cataract    Depression    DISORDER, MENOPAUSAL NOS 03/20/2007   Qualifier: Diagnosis of  By: Arnoldo Morale MD, Balinda Quails    GERD (gastroesophageal reflux disease)  Hyperlipidemia    Hypothyroidism    MYOCARDIAL INFARCTION, HX OF 09/09/2007   Qualifier: Diagnosis of  By: Leanne Chang MD, Bruce     Osteoporosis    Polymyalgia rheumatica ([place])     Family History  Problem Relation Age of  Onset   Hypertension Father    Heart disease Father        Mi age 82, 39, 31 and 33.   Cancer Father    Hyperlipidemia Father    Colon cancer Maternal Uncle    Hypertension Mother    Cancer Mother    Hyperlipidemia Mother    Pulmonary fibrosis Sister    Epilepsy Son    Healthy Son    Healthy Son    Rheum arthritis Niece    Lupus Niece    Rheum arthritis Niece    Past Surgical History:  Procedure Laterality Date   CATARACT EXTRACTION     CESAREAN SECTION     CHOLECYSTECTOMY N/A 11/24/2012   Procedure: LAPAROSCOPIC CHOLECYSTECTOMY;  Surgeon: Ralene Ok, MD;  Location: [facility];  Service: General;  Laterality: N/A;   COSMETIC SURGERY     EYE SURGERY     eye lids lifted   PTCA     Social History   Social History Narrative   Husband and 2 adult children live at home. No grandkids. Son with epilepsy.       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    Immunization History  Administered Date(s) Administered   Influenza Whole 11/09/2008   Influenza, High Dose Seasonal PF 10/30/2016, 12/02/2017, 11/08/2018   Influenza-Unspecified 11/08/2018   Pneumococcal Conjugate-13 10/11/2014   Pneumococcal Polysaccharide-23 12/15/2015   Td 02/12/2006   Tdap 04/26/2017   Zoster 09/19/2010     Objective: Vital Signs: BP 133/86 (BP Location: Left Arm, Patient Position: Sitting, Cuff Size: Normal)    Pulse 69    Resp 13    Ht 5' 1.5" (1.562 m)    Wt 162 lb (73.5 kg)    BMI 30.11 kg/m    Physical Exam Vitals and nursing note reviewed.  Constitutional:      Appearance: She is well-developed.  HENT:     Head: Normocephalic and atraumatic.  Eyes:     Conjunctiva/sclera: Conjunctivae normal.  Cardiovascular:     Rate and Rhythm: Normal rate and regular rhythm.     Heart sounds: Normal heart sounds.  Pulmonary:     Effort: Pulmonary effort is normal.     Breath sounds: Normal breath sounds.  Abdominal:     General: Bowel sounds are normal.       Palpations: Abdomen is soft.  Musculoskeletal:     Cervical back: Normal range of motion.  Lymphadenopathy:     Cervical: No cervical adenopathy.  Skin:    General: Skin is warm and dry.     Capillary Refill: Capillary refill takes less than 2 seconds.  Neurological:     Mental Status: She is alert and oriented to person, place, and time.  Psychiatric:        Behavior: Behavior normal.      Musculoskeletal Exam: C-spine good ROM.  Thoracic kyphosis noted.  Shoulder joints, elbow joints, wrist joints, MCPs, PIPs, and DIPs good ROM with no synovitis. PIP and DIP synovial thickening consistent with osteoarthritis of both hands.  Hip joints, knee joints, ankle joints, MTPs, PIPs, and DIPs good ROM with no synovitis.  No warmth or effusion of knee joints.  No tenderness or swelling  of ankle joints.  Tenderness over the left trochanteric bursa.  CDAI Exam: CDAI Score: -- Patient Global: --; Provider Global: -- Swollen: --; Tender: -- Joint Exam 03/09/2019   No joint exam has been documented for this visit   There is currently no information documented on the homunculus. Go to the Rheumatology activity and complete the homunculus joint exam.  Investigation: No additional findings.  Imaging: No results found.  Recent Labs: Lab Results  Component Value Date   WBC 6.6 02/19/2019   HGB 13.4 02/19/2019   PLT 246 02/19/2019   NA 141 02/19/2019   K 3.7 02/19/2019   CL 106 02/19/2019   CO2 28 02/19/2019   GLUCOSE 126 (H) 02/19/2019   BUN 14 02/19/2019   CREATININE 0.90 02/19/2019   BILITOT 0.6 02/19/2019   ALKPHOS 102 01/01/2019   AST 17 02/19/2019   ALT 16 02/19/2019   PROT 5.9 (L) 02/19/2019   ALBUMIN 3.7 01/01/2019   CALCIUM 9.1 02/19/2019   GFRAA 76 02/19/2019   QFTBGOLDPLUS NEGATIVE 08/12/2018    Speciality Comments: No specialty comments available.  Procedures:  No procedures performed Allergies: Morphine and related, Prochlorperazine, Simvastatin, and Sulfa  antibiotics   Assessment / Plan:     Visit Diagnoses: Polymyalgia rheumatica ([place]) - Diagnosed 7 years ago by rheumatologist: She has not had any signs or symptoms of a flare recently. She has no muscle tenderness, muscle aches, or muscle weakness at this time.  She has no difficulty rising from a seated position or raising her arms above her head.  She is clinically doing well on Methotrexate 4 tablets by mouth once weekly and folic acid 1 mg po daily. She is taking prednisone 3 mg po daily and is tapering by 1 mg every 2 months.  She has not had any new or worsening symptoms since tapering prednisone dose.  She will continue on taper as recommended.  We will check a cortisol level with upcoming lab work in April. She was advised to notify us if she develops signs or symptoms of a flare. She will follow up in 4 months.   High risk medication use - She is taking Methotrexate 4 tablet by mouth once weekly and folic acid 1 mg po daily.  CBC and CMP were drawn on 02/19/19.  She is due to update lab work in April and every 3 months.  Standing orders are in place.   Long term (current) use of systemic steroids - Prednisone 3 mg po daily, tapering by 1 mg every 2 months. We will check a cortisol level with her upcoming lab work.  Future order placed today.   Age-related osteoporosis without current pathological fracture: DEXA on 06/04/17: total left hip BMD 0.619 with T-score -2.6. DEXA ordered by PCP. She is due to update DEXA in April 2021.  Vitamin D was 55 on 02/19/19.  She is taking Fosamax 70 mg 1 tablet by mouth once weekly.  She has not had any recent falls or fractures.   Vitamin D deficiency: She is taking a vitamin D supplement daily.   Trochanteric bursitis, left hip: She has tenderness over the left trochanteric bursa.  She had a cortisone injection on 12/10/18, which provided significant pain relief but her discomfort is returning.  We demonstrated stretching exercises to perform.  She was given a  handout of exercises to perform daily as well. She was advised to notify us if she would like to return for a cortisone injection in the future.  Primary osteoarthritis of both hands: She has PIP and DIP synovial thickening consistent with osteoarthritis of both hands.  She has no synovitis or tenderness on exam.  Joint protection and muscle strengthening were discussed.   Primary osteoarthritis of both feet: She has no feet pain or inflammation at this time. She wears proper fitting shoes.   Primary osteoarthritis of right hip: She has good ROM with no discomfort at this time.   DDD (degenerative disc disease), lumbar: Chronic pain.  She has midline spinal tenderness in the lumbar region. She has no symptoms of radiculopathy at this time.  She declined updated x-rays today.   Other medical conditions are listed as follows:   Abnormal SPEP  Coronary artery disease involving native coronary artery of native heart without angina pectoris  History of hypothyroidism  History of gastroesophageal reflux (GERD)  Adenomatous polyp of colon, unspecified part of colon  History of depression  Orders: Orders Placed This Encounter  Procedures   Cortisol   No orders of the defined types were placed in this encounter.   Face-to-face time spent with patient was 30 minutes. Greater than 50% of time was spent in counseling and coordination of care.  Follow-Up Instructions: Return in about 4 months (around 07/07/2019) for Polymyalgia Rheumatica, Osteoarthritis, DDD.   Bo Merino, MD    Scribed by-  Hazel Sams, PA-C  Note - This record has been created using Dragon software.  Chart creation errors have been sought, but may not always  have been located. Such creation errors do not reflect on  the standard of medical care.

## 2019-03-05 NOTE — Telephone Encounter (Signed)
I left a message asking the patient, spouse and son to call and schedule Medicare AWV with Loma Sousa ([place]).  If patient calls back, please schedule Medicare Wellness Visit (initial) at next available opening. VDM ([place])

## 2019-03-09 ENCOUNTER — Other Ambulatory Visit: Payer: Self-pay

## 2019-03-09 ENCOUNTER — Ambulatory Visit: Payer: PPO | Admitting: Rheumatology

## 2019-03-09 ENCOUNTER — Encounter: Payer: Self-pay | Admitting: Rheumatology

## 2019-03-09 VITALS — BP 133/86 | HR 69 | Resp 13 | Ht 61.5 in | Wt 162.0 lb

## 2019-03-09 DIAGNOSIS — M810 Age-related osteoporosis without current pathological fracture: Secondary | ICD-10-CM

## 2019-03-09 DIAGNOSIS — I2510 Atherosclerotic heart disease of native coronary artery without angina pectoris: Secondary | ICD-10-CM | POA: Diagnosis not present

## 2019-03-09 DIAGNOSIS — R778 Other specified abnormalities of plasma proteins: Secondary | ICD-10-CM

## 2019-03-09 DIAGNOSIS — Z8639 Personal history of other endocrine, nutritional and metabolic disease: Secondary | ICD-10-CM

## 2019-03-09 DIAGNOSIS — E559 Vitamin D deficiency, unspecified: Secondary | ICD-10-CM | POA: Diagnosis not present

## 2019-03-09 DIAGNOSIS — Z7952 Long term (current) use of systemic steroids: Secondary | ICD-10-CM

## 2019-03-09 DIAGNOSIS — Z79899 Other long term (current) drug therapy: Secondary | ICD-10-CM

## 2019-03-09 DIAGNOSIS — M19071 Primary osteoarthritis, right ankle and foot: Secondary | ICD-10-CM | POA: Diagnosis not present

## 2019-03-09 DIAGNOSIS — M7062 Trochanteric bursitis, left hip: Secondary | ICD-10-CM

## 2019-03-09 DIAGNOSIS — Z8659 Personal history of other mental and behavioral disorders: Secondary | ICD-10-CM

## 2019-03-09 DIAGNOSIS — M19041 Primary osteoarthritis, right hand: Secondary | ICD-10-CM

## 2019-03-09 DIAGNOSIS — M353 Polymyalgia rheumatica: Secondary | ICD-10-CM | POA: Diagnosis not present

## 2019-03-09 DIAGNOSIS — M19042 Primary osteoarthritis, left hand: Secondary | ICD-10-CM

## 2019-03-09 DIAGNOSIS — M5136 Other intervertebral disc degeneration, lumbar region: Secondary | ICD-10-CM | POA: Diagnosis not present

## 2019-03-09 DIAGNOSIS — M19072 Primary osteoarthritis, left ankle and foot: Secondary | ICD-10-CM

## 2019-03-09 DIAGNOSIS — M1611 Unilateral primary osteoarthritis, right hip: Secondary | ICD-10-CM | POA: Diagnosis not present

## 2019-03-09 DIAGNOSIS — Z8719 Personal history of other diseases of the digestive system: Secondary | ICD-10-CM

## 2019-03-09 DIAGNOSIS — M51369 Other intervertebral disc degeneration, lumbar region without mention of lumbar back pain or lower extremity pain: Secondary | ICD-10-CM

## 2019-03-09 DIAGNOSIS — D126 Benign neoplasm of colon, unspecified: Secondary | ICD-10-CM

## 2019-03-09 NOTE — Patient Instructions (Addendum)
Standing Labs We placed an order today for your standing lab work.    Please come back and get your standing labs in April and every 3 months   We have open lab daily Monday through Thursday from 8:30-12:30 PM and 1:30-4:30 PM and Friday from 8:30-12:30 PM and 1:30-4:00 PM at the office of Dr. Bo Merino.   You may experience shorter wait times on Monday and Friday afternoons. The office is located at [address] [place], [place], [place] [address] No appointment is necessary.   Labs are drawn by [facility].  You may receive a bill from [facility] for your lab work.  If you wish to have your labs drawn at another location, please call the office 24 hours in advance to send orders.  If you have any questions regarding directions or hours of operation,  please call [phone].   Just as a reminder please drink plenty of water prior to coming for your lab work. Thanks!   Iliotibial Band Syndrome Rehab Ask your health care provider which exercises are safe for you. Do exercises exactly as told by your health care provider and adjust them as directed. It is normal to feel mild stretching, pulling, tightness, or discomfort as you do these exercises. Stop right away if you feel sudden pain or your pain gets significantly worse. Do not begin these exercises until told by your health care provider. Stretching and range-of-motion exercises These exercises warm up your muscles and joints and improve the movement and flexibility of your hip and pelvis. Quadriceps stretch, prone  1. Lie on your abdomen on a firm surface, such as a bed or padded floor (prone position). 2. Bend your left / right knee and reach back to hold your ankle or pant leg. If you cannot reach your ankle or pant leg, loop a belt around your foot and grab the belt instead. 3. Gently pull your heel toward your buttocks. Your knee should not slide out to the side. You should feel a stretch in the front of your thigh and knee  (quadriceps). 4. Hold this position for __________ seconds. Repeat __________ times. Complete this exercise __________ times a day. Iliotibial band stretch An iliotibial band is a strong band of muscle tissue that runs from the outer side of your hip to the outer side of your thigh and knee. 1. Lie on your side with your left / right leg in the top position. 2. Bend both of your knees and grab your left / right ankle. Stretch out your bottom arm to help you balance. 3. Slowly bring your top knee back so your thigh goes behind your trunk. 4. Slowly lower your top leg toward the floor until you feel a gentle stretch on the outside of your left / right hip and thigh. If you do not feel a stretch and your knee will not fall farther, place the heel of your other foot on top of your knee and pull your knee down toward the floor with your foot. 5. Hold this position for __________ seconds. Repeat __________ times. Complete this exercise __________ times a day. Strengthening exercises These exercises build strength and endurance in your hip and pelvis. Endurance is the ability to use your muscles for a long time, even after they get tired. Straight leg raises, side-lying This exercise strengthens the muscles that rotate the leg at the hip and move it away from your body (hip abductors). 1. Lie on your side with your left / right leg  in the top position. Lie so your head, shoulder, hip, and knee line up. You may bend your bottom knee to help you balance. 2. Roll your hips slightly forward so your hips are stacked directly over each other and your left / right knee is facing forward. 3. Tense the muscles in your outer thigh and lift your top leg 4-6 inches (10-15 cm). 4. Hold this position for __________ seconds. 5. Slowly return to the starting position. Let your muscles relax completely before doing another repetition. Repeat __________ times. Complete this exercise __________ times a day. Leg raises,  prone This exercise strengthens the muscles that move the hips (hip extensors). 1. Lie on your abdomen on your bed or a firm surface. You can put a pillow under your hips if that is more comfortable for your lower back. 2. Bend your left / right knee so your foot is straight up in the air. 3. Squeeze your buttocks muscles and lift your left / right thigh off the bed. Do not let your back arch. 4. Tense your thigh muscle as hard as you can without increasing any knee pain. 5. Hold this position for __________ seconds. 6. Slowly lower your leg to the starting position and allow it to relax completely. Repeat __________ times. Complete this exercise __________ times a day. Hip hike 1. Stand sideways on a bottom step. Stand on your left / right leg with your other foot unsupported next to the step. You can hold on to the railing or wall for balance if needed. 2. Keep your knees straight and your torso square. Then lift your left / right hip up toward the ceiling. 3. Slowly let your left / right hip lower toward the floor, past the starting position. Your foot should get closer to the floor. Do not lean or bend your knees. Repeat __________ times. Complete this exercise __________ times a day. This information is not intended to replace advice given to you by your health care provider. Make sure you discuss any questions you have with your health care provider. Document Revised: 05/22/2018 Document Reviewed: 11/20/2017 Elsevier Patient Education  2020 [employer].  Hip Bursitis Rehab Ask your health care provider which exercises are safe for you. Do exercises exactly as told by your health care provider and adjust them as directed. It is normal to feel mild stretching, pulling, tightness, or discomfort as you do these exercises. Stop right away if you feel sudden pain or your pain gets worse. Do not begin these exercises until told by your health care provider. Stretching exercise This exercise  warms up your muscles and joints and improves the movement and flexibility of your hip. This exercise also helps to relieve pain and stiffness. Iliotibial band stretch An iliotibial band is a strong band of muscle tissue that runs from the outer side of your hip to the outer side of your thigh and knee. 1. Lie on your side with your left / right leg in the top position. 2. Bend your left / right knee and grab your ankle. Stretch out your bottom arm to help you balance. 3. Slowly bring your knee back so your thigh is behind your body. 4. Slowly lower your knee toward the floor until you feel a gentle stretch on the outside of your left / right thigh. If you do not feel a stretch and your knee will not fall farther, place the heel of your other foot on top of your knee and pull your knee down  toward the floor with your foot. 5. Hold this position for __________ seconds. 6. Slowly return to the starting position. Repeat __________ times. Complete this exercise __________ times a day. Strengthening exercises These exercises build strength and endurance in your hip and pelvis. Endurance is the ability to use your muscles for a long time, even after they get tired. Bridge This exercise strengthens the muscles that move your thigh backward (hip extensors). 1. Lie on your back on a firm surface with your knees bent and your feet flat on the floor. 2. Tighten your buttocks muscles and lift your buttocks off the floor until your trunk is level with your thighs. ? Do not arch your back. ? You should feel the muscles working in your buttocks and the back of your thighs. If you do not feel these muscles, slide your feet 1-2 inches (2.5-5 cm) farther away from your buttocks. ? If this exercise is too easy, try doing it with your arms crossed over your chest. 3. Hold this position for __________ seconds. 4. Slowly lower your hips to the starting position. 5. Let your muscles relax completely after each  repetition. Repeat __________ times. Complete this exercise __________ times a day. Squats This exercise strengthens the muscles in front of your thigh and knee (quadriceps). 1. Stand in front of a table, with your feet and knees pointing straight ahead. You may rest your hands on the table for balance but not for support. 2. Slowly bend your knees and lower your hips like you are going to sit in a chair. ? Keep your weight over your heels, not over your toes. ? Keep your lower legs upright so they are parallel with the table legs. ? Do not let your hips go lower than your knees. ? Do not bend lower than told by your health care provider. ? If your hip pain increases, do not bend as low. 3. Hold the squat position for __________ seconds. 4. Slowly push with your legs to return to standing. Do not use your hands to pull yourself to standing. Repeat __________ times. Complete this exercise __________ times a day. Hip hike 1. Stand sideways on a bottom step. Stand on your left / right leg with your other foot unsupported next to the step. You can hold on to the railing or wall for balance if needed. 2. Keep your knees straight and your torso square. Then lift your left / right hip up toward the ceiling. 3. Hold this position for __________ seconds. 4. Slowly let your left / right hip lower toward the floor, past the starting position. Your foot should get closer to the floor. Do not lean or bend your knees. Repeat __________ times. Complete this exercise __________ times a day. Single leg stand 1. Without shoes, stand near a railing or in a doorway. You may hold on to the railing or door frame as needed for balance. 2. Squeeze your left / right buttock muscles, then lift up your other foot. ? Do not let your left / right hip push out to the side. ? It is helpful to stand in front of a mirror for this exercise so you can watch your hip. 3. Hold this position for __________ seconds. Repeat  __________ times. Complete this exercise __________ times a day. This information is not intended to replace advice given to you by your health care provider. Make sure you discuss any questions you have with your health care provider. Document Revised: 05/26/2018 Document Reviewed: 05/26/2018  Elsevier Patient Education  2020 [place].   Back Exercises These exercises help to make your trunk and back strong. They also help to keep the lower back flexible. Doing these exercises can help to prevent back pain or lessen existing pain.  If you have back pain, try to do these exercises 2-3 times each day or as told by your doctor.  As you get better, do the exercises once each day. Repeat the exercises more often as told by your doctor.  To stop back pain from coming back, do the exercises once each day, or as told by your doctor. Exercises Single knee to chest Do these steps 3-5 times in a row for each leg: 6. Lie on your back on a firm bed or the floor with your legs stretched out. 7. Bring one knee to your chest. 8. Grab your knee or thigh with both hands and hold them it in place. 9. Pull on your knee until you feel a gentle stretch in your lower back or buttocks. 10. Keep doing the stretch for 10-30 seconds. 11. Slowly let go of your leg and straighten it. Pelvic tilt Do these steps 5-10 times in a row: 6. Lie on your back on a firm bed or the floor with your legs stretched out. 7. Bend your knees so they point up to the ceiling. Your feet should be flat on the floor. 8. Tighten your lower belly (abdomen) muscles to press your lower back against the floor. This will make your tailbone point up to the ceiling instead of pointing down to your feet or the floor. 9. Stay in this position for 5-10 seconds while you gently tighten your muscles and breathe evenly. Cat-cow Do these steps until your lower back bends more easily: 7. Get on your hands and knees on a firm surface. Keep your  hands under your shoulders, and keep your knees under your hips. You may put padding under your knees. 8. Let your head hang down toward your chest. Tighten (contract) the muscles in your belly. Point your tailbone toward the floor so your lower back becomes rounded like the back of a cat. 9. Stay in this position for 5 seconds. 10. Slowly lift your head. Let the muscles of your belly relax. Point your tailbone up toward the ceiling so your back forms a sagging arch like the back of a cow. 11. Stay in this position for 5 seconds.  Press-ups Do these steps 5-10 times in a row: 4. Lie on your belly (face-down) on the floor. 5. Place your hands near your head, about shoulder-width apart. 6. While you keep your back relaxed and keep your hips on the floor, slowly straighten your arms to raise the top half of your body and lift your shoulders. Do not use your back muscles. You may change where you place your hands in order to make yourself more comfortable. 7. Stay in this position for 5 seconds. 8. Slowly return to lying flat on the floor.  Bridges Do these steps 10 times in a row: 1. Lie on your back on a firm surface. 2. Bend your knees so they point up to the ceiling. Your feet should be flat on the floor. Your arms should be flat at your sides, next to your body. 3. Tighten your butt muscles and lift your butt off the floor until your waist is almost as high as your knees. If you do not feel the muscles working in your butt and the  back of your thighs, slide your feet 1-2 inches farther away from your butt. 4. Stay in this position for 3-5 seconds. 5. Slowly lower your butt to the floor, and let your butt muscles relax. If this exercise is too easy, try doing it with your arms crossed over your chest. Belly crunches Do these steps 5-10 times in a row: 1. Lie on your back on a firm bed or the floor with your legs stretched out. 2. Bend your knees so they point up to the ceiling. Your feet  should be flat on the floor. 3. Cross your arms over your chest. 4. Tip your chin a little bit toward your chest but do not bend your neck. 5. Tighten your belly muscles and slowly raise your chest just enough to lift your shoulder blades a tiny bit off of the floor. Avoid raising your body higher than that, because it can put too much stress on your low back. 6. Slowly lower your chest and your head to the floor. Back lifts Do these steps 5-10 times in a row: 1. Lie on your belly (face-down) with your arms at your sides, and rest your forehead on the floor. 2. Tighten the muscles in your legs and your butt. 3. Slowly lift your chest off of the floor while you keep your hips on the floor. Keep the back of your head in line with the curve in your back. Look at the floor while you do this. 4. Stay in this position for 3-5 seconds. 5. Slowly lower your chest and your face to the floor. Contact a doctor if:  Your back pain gets a lot worse when you do an exercise.  Your back pain does not get better 2 hours after you exercise. If you have any of these problems, stop doing the exercises. Do not do them again unless your doctor says it is okay. Get help right away if:  You have sudden, very bad back pain. If this happens, stop doing the exercises. Do not do them again unless your doctor says it is okay. This information is not intended to replace advice given to you by your health care provider. Make sure you discuss any questions you have with your health care provider. Document Revised: 10/24/2017 Document Reviewed: 10/24/2017 Elsevier Patient Education  2020 [employer].

## 2019-03-11 ENCOUNTER — Ambulatory Visit: Payer: PPO | Admitting: Rheumatology

## 2019-03-30 ENCOUNTER — Other Ambulatory Visit: Payer: Self-pay | Admitting: Rheumatology

## 2019-03-30 DIAGNOSIS — M353 Polymyalgia rheumatica: Secondary | ICD-10-CM

## 2019-03-30 NOTE — Telephone Encounter (Signed)
Last Visit: 03/09/2019 Next Visit: 04/09/2019  Okay to refill per Dr. Estanislado Pandy.

## 2019-04-06 NOTE — Progress Notes (Signed)
Office Visit Note  Patient: [name]             Date of Birth: 08/02/1950           MRN: [id number]             PCP: [name], MD Referring: [name], MD Visit Date: 04/09/2019 Occupation: @GUAROCC @  Subjective:  Low back pain   History of Present Illness: [name] is a 69 y.o. female with history of PMR, osteoarthritis, and osteoporosis.  Patient is on methotrexate 4 tablets by mouth once weekly and folic acid 1 mg by mouth daily.  She is taking prednisone 3 mg daily.  She is tapering prednisone by 1 mg every month.  She denies any signs or symptoms of a PMR flare since tapering the dose of prednisone.  She denies any muscle weakness or muscle aches at this time.  She has no difficulty rising from a seated position or raising her arms above her head.  She denies any recent falls or fractures.  She continues to take Fosamax 70 mg 1 tablet by mouth once weekly.  She presents today with increased lower back pain which started 2 weeks ago.  She denies any injuries at that time.  She states the pain is most severe midday.  She denies any nocturnal pain.  She denies any symptoms of sciatica.  She is been experiencing some muscle spasms in her lower back as well.  She has tried using a Salonpas patches, heating pad, and taking ibuprofen for pain relief.  She states that in the afternoons her pain is a 10 out of 10.  She denies any hip joint pain at this time.  She denies any joint swelling currently   Activities of Daily Living:  Patient reports morning stiffness for 1 hour.   Patient Denies nocturnal pain.  Difficulty dressing/grooming: Denies Difficulty climbing stairs: Reports Difficulty getting out of chair: Denies Difficulty using hands for taps, buttons, cutlery, and/or writing: Denies  Review of Systems  Constitutional: Positive for fatigue.  HENT: Negative for mouth sores, mouth dryness and nose dryness.   Eyes: Negative for pain, itching, visual  disturbance and dryness.  Respiratory: Negative for cough, hemoptysis, shortness of breath, wheezing and difficulty breathing.   Cardiovascular: Negative for chest pain, palpitations, hypertension and swelling in legs/feet.  Gastrointestinal: Negative for blood in stool, constipation, diarrhea and heartburn.  Endocrine: Negative for increased urination.  Genitourinary: Negative for difficulty urinating and painful urination.  Musculoskeletal: Positive for arthralgias, joint pain and morning stiffness. Negative for joint swelling, myalgias, muscle weakness, muscle tenderness and myalgias.  Skin: Negative for color change, pallor, rash, hair loss, nodules/bumps, skin tightness, ulcers and sensitivity to sunlight.  Allergic/Immunologic: Negative for susceptible to infections.  Neurological: Negative for dizziness, numbness, headaches, memory loss and weakness.  Hematological: Negative for bruising/bleeding tendency and swollen glands.  Psychiatric/Behavioral: Negative for depressed mood, confusion and sleep disturbance. The patient is not nervous/anxious.     PMFS History:  Patient Active Problem List   Diagnosis Date Noted  . Educated about COVID-19 virus infection 01/26/2019  . Dyslipidemia 01/26/2019  . Drug-induced myopathy 01/14/2019  . Haglund's deformity 01/12/2019  . Myalgia 12/30/2018  . High risk medication use 12/04/2018  . Recurrent major depressive disorder, in full remission ([place]) 09/03/2018  . Lower esophageal ring ([place]) 03/03/2018  . Osteoarthritis of right hip 12/02/2017  . GERD (gastroesophageal reflux disease) 12/15/2015  . Polyp of colon, adenomatous 10/11/2014  .  Vitamin D deficiency 05/26/2014  . Osteoporosis 05/26/2014  . Polymyalgia rheumatica ([place]) 12/01/2010  . CAD (coronary artery disease) 09/09/2007  . Hematuria 09/09/2007  . Depression 03/20/2007  . Hypothyroid 02/13/1999    Past Medical History:  Diagnosis Date  . Anxiety   . Arthritis   . CAD  (coronary artery disease)   . Cataract   . Depression   . DISORDER, MENOPAUSAL NOS 03/20/2007   Qualifier: Diagnosis of  By: Arnoldo Morale MD, Balinda Quails   . GERD (gastroesophageal reflux disease)   . Hyperlipidemia   . Hypothyroidism   . MYOCARDIAL INFARCTION, HX OF 09/09/2007   Qualifier: Diagnosis of  By: Leanne Chang MD, Bruce    . Osteoporosis   . Polymyalgia rheumatica (HCC)     Family History  Problem Relation Age of Onset  . Hypertension Father   . Heart disease Father        Mi age 26, 13, 66 and 48.  . Cancer Father   . Hyperlipidemia Father   . Colon cancer Maternal Uncle   . Hypertension Mother   . Cancer Mother   . Hyperlipidemia Mother   . Pulmonary fibrosis Sister   . Epilepsy Son   . Healthy Son   . Healthy Son   . Rheum arthritis Niece   . Lupus Niece   . Rheum arthritis Niece    Past Surgical History:  Procedure Laterality Date  . CATARACT EXTRACTION    . CESAREAN SECTION    . CHOLECYSTECTOMY N/A 11/24/2012   Procedure: LAPAROSCOPIC CHOLECYSTECTOMY;  Surgeon: Ralene Ok, MD;  Location: [facility];  Service: General;  Laterality: N/A;  . COSMETIC SURGERY    . EYE SURGERY     eye lids lifted  . PTCA     Social History   Social History Narrative   Husband and 2 adult children live at home. No grandkids. Son with epilepsy.       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 No part time.    Immunization History  Administered Date(s) Administered  . Influenza Whole 11/09/2008  . Influenza, High Dose Seasonal PF 10/30/2016, 12/02/2017, 11/08/2018  . Influenza-Unspecified 11/08/2018  . Pneumococcal Conjugate-13 10/11/2014  . Pneumococcal Polysaccharide-23 12/15/2015  . Td 02/12/2006  . Tdap 04/26/2017  . Zoster 09/19/2010     Objective: Vital Signs: BP (!) 150/90 (BP Location: Left Arm, Patient Position: Sitting, Cuff Size: Normal)   Pulse 61   Resp 13   Ht 5' 1.5" (1.562 m)   Wt 158 lb (71.7 kg)   BMI 29.37 kg/m    Physical Exam Vitals and nursing note  reviewed.  Constitutional:      Appearance: She is well-developed.  HENT:     Head: Normocephalic and atraumatic.  Eyes:     Conjunctiva/sclera: Conjunctivae normal.  Pulmonary:     Effort: Pulmonary effort is normal.  Abdominal:     General: Bowel sounds are normal.     Palpations: Abdomen is soft.  Musculoskeletal:     Cervical back: Normal range of motion.  Lymphadenopathy:     Cervical: No cervical adenopathy.  Skin:    General: Skin is warm and dry.     Capillary Refill: Capillary refill takes less than 2 seconds.  Neurological:     Mental Status: She is alert and oriented to person, place, and time.  Psychiatric:        Behavior: Behavior normal.      Musculoskeletal Exam: C-spine good range of motion with no discomfort.  Thoracic kyphosis noted.  Limited range of motion lumbar spine with discomfort.  Midline spinal tenderness in the lumbar region.  No SI joint tenderness noted.  Shoulder joints, elbow joints, wrist joints, MCPs, PIPs and DIPs good range of motion no synovitis.  She has PIP and DIP synovial thickening consistent with osteoarthritis of both hands.  She has bilateral CMC joint synovial thickening.  She has complete fist formation bilaterally.  Hip joints have good range of motion with no discomfort at this time.  Knee joints have good range of motion with no warmth or effusion.  Ankle joints have good range of motion with no tenderness or inflammation.  CDAI Exam: CDAI Score: -- Patient Global: --; Provider Global: -- Swollen: --; Tender: -- Joint Exam 04/09/2019   No joint exam has been documented for this visit   There is currently no information documented on the homunculus. Go to the Rheumatology activity and complete the homunculus joint exam.  Investigation: No additional findings.  Imaging: XR Lumbar Spine 2-3 Views  Result Date: 04/09/2019 Levoscoliosis was noted.  Multilevel spondylosis and facet joint arthropathy was noted.  L1 severe  compression fracture was noted.  Possible T12 wedging was noted. Impression: These findings are consistent with a scoliosis, spondylosis, facet joint arthropathy and L1 compression fracture.   Recent Labs: Lab Results  Component Value Date   WBC 6.6 02/19/2019   HGB 13.4 02/19/2019   PLT 246 02/19/2019   NA 141 02/19/2019   K 3.7 02/19/2019   CL 106 02/19/2019   CO2 28 02/19/2019   GLUCOSE 126 (H) 02/19/2019   BUN 14 02/19/2019   CREATININE 0.90 02/19/2019   BILITOT 0.6 02/19/2019   ALKPHOS 102 01/01/2019   AST 17 02/19/2019   ALT 16 02/19/2019   PROT 5.9 (L) 02/19/2019   ALBUMIN 3.7 01/01/2019   CALCIUM 9.1 02/19/2019   GFRAA 76 02/19/2019   QFTBGOLDPLUS NEGATIVE 08/12/2018    Speciality Comments: No specialty comments available.  Procedures:  No procedures performed Allergies: Morphine and related, Prochlorperazine, Simvastatin, and Sulfa antibiotics   Assessment / Plan:     Visit Diagnoses: Polymyalgia rheumatica ([place]) - Diagnosed 7 years ago by rheumatologist: She has not had any signs or symptoms of a PMR flare recently.  She has no muscle weakness or muscle tenderness at this time.  She has no difficulty rising from a seated position or raising her arms above her head at this time.  She is clinically doing well on methotrexate 4 tablets by mouth once weekly and folic acid 1 mg by mouth daily.  She is currently on prednisone 3 mg daily and is tapering by 1 mg every 2 months.  She is planning on reducing prednisone to 2 mg daily starting March 1.  She has not noticed any new or worsening symptoms since reducing the dose of prednisone.  She was advised to notify us if you develop signs of a flare.  She will follow-up in the office in 5 months.   High risk medication use - Methotrexate 4 tablet by mouth once weekly and folic acid 1 mg po daily.  CBC and CMP were drawn on 02/19/2019.  She will be due for lab work in April and every 3 months.  Standing orders are in place.  Long  term (current) use of systemic steroids - Prednisone 3 mg po daily, tapering by 1 mg every 2 months.   Age-related osteoporosis without current pathological fracture - DEXA on 06/04/17: total left hip BMD  0.619 with T-score -2.6. DEXA ordered by PCP. She is due to update DEXA in April 2021.  She is taking Fosamax 70 mg 1 tablet by mouth once weekly.  She has a history of an L1 compression fracture.  She is point tender over T12-L1 today.  X-rays of the lumbar spine were updated today.  She will be scheduled for an MRI of the lumbar spine.  After updating her bone density in April 2021 we will discuss more aggressive treatment for osteoporosis management, such as forteo or tymlos.- Plan: MR LUMBAR SPINE WO CONTRAST  Vitamin D deficiency: She was encouraged to continue taking vitamin D daily.  Trochanteric bursitis, left hip: Resolved.  Primary osteoarthritis of both hands: She has PIP and DIP synovial thickening consistent with osteoarthritis of both hands.  No tenderness or synovitis was noted on exam.  She has complete fist formation bilaterally.  Joint protection and muscle strengthening were discussed.  Primary osteoarthritis of both feet: She has no discomfort in her feet at this time.  No joint swelling.  Primary osteoarthritis of right hip: She has good range of motion with no discomfort at this time  DDD (degenerative disc disease), lumbar: She presents today with severe lower back pain.  She has midline spinal tenderness over T12-L1.  She is experiencing any symptoms of radiculopathy at this time.  She has some discomfort with flexion extension of the lumbar spine.  She had x-rays of the lumbar spine on 05/16/2018 ordered by Dr. Lorin Mercy which revealed an old L1 compression fracture.x-rays of the lumbar spine were updated today and these findings were consistent with a scoliosis, spondylosis, facet joint arthropathy and L1 compression fracture.  An MRI of the lumbar spine was ordered.   Other  medical conditions are listed as follows:  Abnormal SPEP  History of hypothyroidism  Coronary artery disease involving native coronary artery of native heart without angina pectoris  History of gastroesophageal reflux (GERD)  History of depression  Adenomatous polyp of colon, unspecified part of colon  Chronic midline low back pain without sciatica - Plan: XR Lumbar Spine 2-3 Views, MR LUMBAR SPINE WO CONTRAST  Orders: Orders Placed This Encounter  Procedures  . XR Lumbar Spine 2-3 Views  . MR LUMBAR SPINE WO CONTRAST   Meds ordered this encounter  Medications  . diazepam (VALIUM) 5 MG tablet    Sig: Take 1 tablet by mouth prior to MRI.    Dispense:  2 tablet    Refill:  0    Face-to-face time spent with patient was 30 minutes. Greater than 50% of time was spent in counseling and coordination of care.  Follow-Up Instructions: Return in about 6 months (around 10/07/2019) for Polymyalgia Rheumatica, Osteoporosis.   Hazel Sams, PA-C  I examined and evaluated the patient with Hazel Sams PA.  Patient was experiencing severe pain and discomfort in her lower back.  She has an old compression fracture at L1.  I am concerned that she may have a T12 compression fracture.  She had tenderness over the lower thoracic and upper lumbar region.  We will schedule MRI of her lumbar spine.  The plan of care was discussed as noted above.  Bo Merino, MD  Note - This record has been created using Editor, commissioning.  Chart creation errors have been sought, but may not always  have been located. Such creation errors do not reflect on  the standard of medical care.

## 2019-04-09 ENCOUNTER — Other Ambulatory Visit: Payer: Self-pay

## 2019-04-09 ENCOUNTER — Ambulatory Visit: Payer: PPO | Admitting: Rheumatology

## 2019-04-09 ENCOUNTER — Ambulatory Visit (INDEPENDENT_AMBULATORY_CARE_PROVIDER_SITE_OTHER): Payer: PPO

## 2019-04-09 ENCOUNTER — Encounter: Payer: Self-pay | Admitting: Rheumatology

## 2019-04-09 VITALS — BP 150/90 | HR 61 | Resp 13 | Ht 61.5 in | Wt 158.0 lb

## 2019-04-09 DIAGNOSIS — M5136 Other intervertebral disc degeneration, lumbar region: Secondary | ICD-10-CM | POA: Diagnosis not present

## 2019-04-09 DIAGNOSIS — G8929 Other chronic pain: Secondary | ICD-10-CM

## 2019-04-09 DIAGNOSIS — M810 Age-related osteoporosis without current pathological fracture: Secondary | ICD-10-CM | POA: Diagnosis not present

## 2019-04-09 DIAGNOSIS — M7062 Trochanteric bursitis, left hip: Secondary | ICD-10-CM | POA: Diagnosis not present

## 2019-04-09 DIAGNOSIS — M19071 Primary osteoarthritis, right ankle and foot: Secondary | ICD-10-CM

## 2019-04-09 DIAGNOSIS — M1611 Unilateral primary osteoarthritis, right hip: Secondary | ICD-10-CM | POA: Diagnosis not present

## 2019-04-09 DIAGNOSIS — Z8719 Personal history of other diseases of the digestive system: Secondary | ICD-10-CM

## 2019-04-09 DIAGNOSIS — R778 Other specified abnormalities of plasma proteins: Secondary | ICD-10-CM

## 2019-04-09 DIAGNOSIS — Z79899 Other long term (current) drug therapy: Secondary | ICD-10-CM | POA: Diagnosis not present

## 2019-04-09 DIAGNOSIS — I2510 Atherosclerotic heart disease of native coronary artery without angina pectoris: Secondary | ICD-10-CM

## 2019-04-09 DIAGNOSIS — Z8639 Personal history of other endocrine, nutritional and metabolic disease: Secondary | ICD-10-CM | POA: Diagnosis not present

## 2019-04-09 DIAGNOSIS — M19041 Primary osteoarthritis, right hand: Secondary | ICD-10-CM

## 2019-04-09 DIAGNOSIS — M545 Other chronic pain: Secondary | ICD-10-CM

## 2019-04-09 DIAGNOSIS — Z8659 Personal history of other mental and behavioral disorders: Secondary | ICD-10-CM

## 2019-04-09 DIAGNOSIS — D126 Benign neoplasm of colon, unspecified: Secondary | ICD-10-CM

## 2019-04-09 DIAGNOSIS — M353 Polymyalgia rheumatica: Secondary | ICD-10-CM

## 2019-04-09 DIAGNOSIS — M19042 Primary osteoarthritis, left hand: Secondary | ICD-10-CM

## 2019-04-09 DIAGNOSIS — Z7952 Long term (current) use of systemic steroids: Secondary | ICD-10-CM | POA: Diagnosis not present

## 2019-04-09 DIAGNOSIS — E559 Vitamin D deficiency, unspecified: Secondary | ICD-10-CM

## 2019-04-09 DIAGNOSIS — M19072 Primary osteoarthritis, left ankle and foot: Secondary | ICD-10-CM

## 2019-04-09 DIAGNOSIS — M51369 Other intervertebral disc degeneration, lumbar region without mention of lumbar back pain or lower extremity pain: Secondary | ICD-10-CM

## 2019-04-09 MED ORDER — DIAZEPAM 5 MG PO TABS: ORAL_TABLET | ORAL | 0 refills | Status: DC

## 2019-04-21 ENCOUNTER — Encounter: Payer: Self-pay | Admitting: Rheumatology

## 2019-04-22 MED ORDER — DIAZEPAM 5 MG PO TABS: ORAL_TABLET | ORAL | 0 refills | Status: DC

## 2019-04-22 NOTE — Telephone Encounter (Signed)
It is it is okay to send 1 tablet of Valium 5 mg p.o. prior to the procedure.  Patient will need most likely only 1 tablet.

## 2019-04-23 ENCOUNTER — Encounter: Payer: Self-pay | Admitting: Family Medicine

## 2019-04-23 ENCOUNTER — Other Ambulatory Visit: Payer: Self-pay

## 2019-04-23 ENCOUNTER — Telehealth: Payer: Self-pay | Admitting: Rheumatology

## 2019-04-23 ENCOUNTER — Ambulatory Visit (HOSPITAL_COMMUNITY)
Admission: RE | Admit: 2019-04-23 | Discharge: 2019-04-23 | Disposition: A | Discharge status: Home / Self Care | Payer: PPO | Source: Ambulatory Visit | Attending: Rheumatology | Admitting: Rheumatology

## 2019-04-23 ENCOUNTER — Ambulatory Visit (HOSPITAL_COMMUNITY): Payer: PPO

## 2019-04-23 DIAGNOSIS — M810 Age-related osteoporosis without current pathological fracture: Secondary | ICD-10-CM | POA: Diagnosis not present

## 2019-04-23 DIAGNOSIS — Z79899 Other long term (current) drug therapy: Secondary | ICD-10-CM

## 2019-04-23 DIAGNOSIS — G8929 Other chronic pain: Secondary | ICD-10-CM

## 2019-04-23 DIAGNOSIS — M1611 Unilateral primary osteoarthritis, right hip: Secondary | ICD-10-CM

## 2019-04-23 DIAGNOSIS — Z780 Asymptomatic menopausal state: Secondary | ICD-10-CM

## 2019-04-23 DIAGNOSIS — M545 Low back pain: Secondary | ICD-10-CM | POA: Diagnosis not present

## 2019-04-23 NOTE — Telephone Encounter (Signed)
Future Order placed for Lipid Panel. Patient advised.

## 2019-04-23 NOTE — Telephone Encounter (Signed)
Patient left a voicemail requesting a return call to let her know when her fasting labs are due.

## 2019-04-23 NOTE — Telephone Encounter (Signed)
Patient will be coming at the beginning of April for her standing labs and a Cortisol lab. Patient would like to know if we can add a lipid panel as well. She states she will be due to have this done later in April with her cardiologist and wold like to just be stuck once. Please advise.

## 2019-04-23 NOTE — Telephone Encounter (Signed)
Ok to place future order for lipid panel.

## 2019-04-24 ENCOUNTER — Encounter: Payer: Self-pay | Admitting: Rheumatology

## 2019-04-27 ENCOUNTER — Telehealth: Payer: Self-pay | Admitting: Rheumatology

## 2019-04-27 NOTE — Telephone Encounter (Signed)
Patient returning your call.

## 2019-04-27 NOTE — Telephone Encounter (Signed)
Patient advised Dr. Estanislado Pandy would like her to see a spinal specialist. Patient states she sees Dr. Lorin Mercy.  Patient advised she has multilevel degenerative changes and mild spinal canal stenosis.  She also has an old compression fracture at L1.  She notify patient that she is due to update DEXA in April 2021.   Patient states she has contacted PCP to place order for DEXA and will call to make an appointment with Dr. Lorin Mercy.

## 2019-05-04 ENCOUNTER — Encounter: Payer: Self-pay | Admitting: Rheumatology

## 2019-05-06 ENCOUNTER — Other Ambulatory Visit: Payer: Self-pay | Admitting: Rheumatology

## 2019-05-06 DIAGNOSIS — M353 Polymyalgia rheumatica: Secondary | ICD-10-CM

## 2019-05-07 ENCOUNTER — Encounter: Payer: Self-pay | Admitting: Rheumatology

## 2019-05-07 ENCOUNTER — Ambulatory Visit (INDEPENDENT_AMBULATORY_CARE_PROVIDER_SITE_OTHER): Payer: PPO | Admitting: Orthopaedic Surgery

## 2019-05-07 ENCOUNTER — Encounter: Payer: Self-pay | Admitting: Orthopaedic Surgery

## 2019-05-07 VITALS — BP 153/105 | HR 71 | Ht 61.5 in | Wt 160.0 lb

## 2019-05-07 DIAGNOSIS — S32010S Wedge compression fracture of first lumbar vertebra, sequela: Secondary | ICD-10-CM

## 2019-05-07 DIAGNOSIS — M545 Low back pain, unspecified: Secondary | ICD-10-CM

## 2019-05-07 DIAGNOSIS — G8929 Other chronic pain: Secondary | ICD-10-CM | POA: Diagnosis not present

## 2019-05-07 MED ORDER — METHOTREXATE 2.5 MG PO TABS: ORAL_TABLET | ORAL | 0 refills | Status: DC

## 2019-05-07 NOTE — Telephone Encounter (Signed)
Last Visit: 04/09/19 Next Visit: 10/08/19 Labs: 02/19/19 CBC WNL. Glucose is mildly elevated-126. Total protein borderline low but stable. Rest of CMP WNL.  Current Dose per office note on 04/09/19: Methotrexate 4 tablet by mouth once weekly   Okay to refill per Dr. Estanislado Pandy

## 2019-05-07 NOTE — Progress Notes (Signed)
Office Visit Note   Patient: [name]           Date of Birth: 24-May-1950           MRN: [id number] Visit Date: 05/07/2019              Requested by: [name], MD [place],  [place] [id number] PCP: [name], MD   Assessment & Plan: Visit Diagnoses:  1. Chronic bilateral low back pain, unspecified whether sciatica present   2. Compression fracture of L1 vertebra, sequela     Plan: Patient is having increased symptoms and particularly if she weans down her prednisone.  We discussed flare in symptoms as prednisone is weaned.  She states whenever she is taking prednisone at a higher dose she feels "fantastic".  MRI images are reviewed she has the old L1 compression fracture there is only mild narrowing at L4-5 no compressive lesions.  Continue walking program ,vitamin D calcium.  Follow-Up Instructions:   Orders:  Orders Placed This Encounter  Procedures  . Ambulatory referral to Physical Therapy   No orders of the defined types were placed in this encounter.     Procedures: No procedures performed   Clinical Data: No additional findings.   Subjective: Chief Complaint  Patient presents with  . Lower Back - Pain    HPI 69 year old female referred back by Dr. Everardo Pacific for continued problems with low back pain.  Pain does not radiate down her legs.  She has been taking prednisone for a couple years for polymyalgia rheumatica is on a gradual wean of prednisone.  She is on 2 mg/day currently.  She woke up and had great trouble getting out of bed problems standing walking.  She states that it has gotten better but her major problem is been when she first gets up she does well in the morning but by afternoon she has gradual increase in discomfort by the end of the day she is having increased back pain.  She is on methotrexate 2.5mg .  Ibuprofen has taken the edge off.  Lumbar MRI scan done 04/24/2019 was reviewed today images and report.   Review of Systems 14 point update unchanged from 07/01/2018 office visit other than as mentioned above.   Objective: Vital Signs: BP (!) 153/105   Pulse 71   Ht 5' 1.5" (1.562 m)   Wt 160 lb (72.6 kg)   BMI 29.74 kg/m   Physical Exam Constitutional:      Appearance: She is well-developed.  HENT:     Head: Normocephalic.     Right Ear: External ear normal.     Left Ear: External ear normal.  Eyes:     Pupils: Pupils are equal, round, and reactive to light.  Neck:     Thyroid: No thyromegaly.     Trachea: No tracheal deviation.  Cardiovascular:     Rate and Rhythm: Normal rate.  Pulmonary:     Effort: Pulmonary effort is normal.  Abdominal:     Palpations: Abdomen is soft.  Skin:    General: Skin is warm and dry.  Neurological:     Mental Status: She is alert and oriented to person, place, and time.  Psychiatric:        Behavior: Behavior normal.     Ortho Exam patient gets from standing she can ambulate across the office exam room without limping.  Negative logroll the hips knees reach full extension good quad strength.  No dermatitis sensory deficit lower extremities.  Specialty Comments:  No specialty comments available.  Imaging: CLINICAL DATA:  Chronic low back pain  EXAM: MRI LUMBAR SPINE WITHOUT CONTRAST  TECHNIQUE: Multiplanar, multisequence MR imaging of the lumbar spine was performed. No intravenous contrast was administered.  COMPARISON:  MRI of the lumbar spine June 12, 2014  FINDINGS: Segmentation:  Standard.  Alignment:  There is a levoscoliosis of the lumbar spine.  Vertebrae: Chronic fracture of the L1 vertebral body with loss of approximately 60% of the vertebral body height with mild retropulsion of the superior aspect of the posterior wall into the spinal canal without significant spinal canal stenosis.  Marrow edema is seen in the is superior articular process of L5 on the left side related to degenerative changes.  No  acute fracture, evidence of discitis, or bone lesion.  Conus medullaris and cauda equina: Conus extends to the T12-L1 level. Conus and cauda equina appear normal.  Paraspinal and other soft tissues: Negative.  Disc levels:  T12-L1: Small retropulsion of the posterior wall into the spinal canal without significant spinal canal stenosis. Perineural cysts noted on the right side. There is no neural foraminal stenosis.  L1-2: No spinal canal or neural foraminal stenosis.  L2-3: Shallow disc bulge resulting in mild bilateral neural foraminal narrowing. No spinal canal stenosis.  L3-4: Shallow disc bulge and mild facet degenerative changes with ligamentum flavum redundancy resulting in mild left neural foraminal narrowing. No spinal canal stenosis.  L4-5: Disc bulge with superimposed left central disc protrusion, facet degenerative changes with mild ligamentum flavum redundancy resulting in mild spinal canal stenosis with narrowing of the left subarticular zone, mild right and moderate left neural foraminal narrowing.  L5-S1: Facet degenerative changes, left greater than right. No spinal canal or neural foraminal stenosis.  IMPRESSION: 1. No acute fracture of the lumbar spine. 2. Chronic L1 compression fracture with loss of approximately 60% of vertebral body height with mild retropulsion of the posterior wall into the spinal canal without significant spinal canal stenosis. 3. Multilevel degenerative changes of the lumbar spine, worst at L4-L5 with mild spinal canal stenosis, narrowing of the left subarticular zone, mild right and moderate left neural foraminal narrowing.   Electronically Signed   By: Pedro Earls M.D.   On: 04/24/2019 11:13    PMFS History: Patient Active Problem List   Diagnosis Date Noted  . Lumbar compression fracture ([place]) 05/11/2019  . Educated about COVID-19 virus infection 01/26/2019  . Dyslipidemia 01/26/2019  .  Drug-induced myopathy 01/14/2019  . Haglund's deformity 01/12/2019  . Myalgia 12/30/2018  . High risk medication use 12/04/2018  . Recurrent major depressive disorder, in full remission ([place]) 09/03/2018  . Lower esophageal ring ([place]) 03/03/2018  . Osteoarthritis of right hip 12/02/2017  . GERD (gastroesophageal reflux disease) 12/15/2015  . Polyp of colon, adenomatous 10/11/2014  . Vitamin D deficiency 05/26/2014  . Osteoporosis 05/26/2014  . Polymyalgia rheumatica ([place]) 12/01/2010  . CAD (coronary artery disease) 09/09/2007  . Hematuria 09/09/2007  . Depression 03/20/2007  . Hypothyroid 02/13/1999   Past Medical History:  Diagnosis Date  . Anxiety   . Arthritis   . CAD (coronary artery disease)   . Cataract   . Depression   . DISORDER, MENOPAUSAL NOS 03/20/2007   Qualifier: Diagnosis of  By: Arnoldo Morale MD, Balinda Quails   . GERD (gastroesophageal reflux disease)   . Hyperlipidemia   . Hypothyroidism   . MYOCARDIAL INFARCTION, HX OF 09/09/2007   Qualifier: Diagnosis  of  By: Leanne Chang MD, [place]    . Osteoporosis   . Polymyalgia rheumatica (HCC)     Family History  Problem Relation Age of Onset  . Hypertension Father   . Heart disease Father        Mi age 44, 51, 91 and 75.  . Cancer Father   . Hyperlipidemia Father   . Colon cancer Maternal Uncle   . Hypertension Mother   . Cancer Mother   . Hyperlipidemia Mother   . Pulmonary fibrosis Sister   . Epilepsy Son   . Healthy Son   . Healthy Son   . Rheum arthritis Niece   . Lupus Niece   . Rheum arthritis Niece     Past Surgical History:  Procedure Laterality Date  . CATARACT EXTRACTION    . CESAREAN SECTION    . CHOLECYSTECTOMY N/A 11/24/2012   Procedure: LAPAROSCOPIC CHOLECYSTECTOMY;  Surgeon: Ralene Ok, MD;  Location: [facility];  Service: General;  Laterality: N/A;  . COSMETIC SURGERY    . EYE SURGERY     eye lids lifted  . PTCA     Social History   Occupational History  . Not on file  Tobacco Use  . Smoking  status: Never Smoker  . Smokeless tobacco: Never Used  Substance and Sexual Activity  . Alcohol use: No    Alcohol/week: 0.0 standard drinks  . Drug use: No  . Sexual activity: Not on file

## 2019-05-08 ENCOUNTER — Ambulatory Visit: Payer: PPO | Admitting: Orthopaedic Surgery

## 2019-05-09 ENCOUNTER — Encounter: Payer: Self-pay | Admitting: Rheumatology

## 2019-05-09 ENCOUNTER — Encounter: Payer: Self-pay | Admitting: Orthopaedic Surgery

## 2019-05-11 DIAGNOSIS — S32000A Wedge compression fracture of unspecified lumbar vertebra, initial encounter for closed fracture: Secondary | ICD-10-CM | POA: Insufficient documentation

## 2019-05-11 MED ORDER — PREDNISONE 5 MG PO TABS: 10.0000 mg | ORAL_TABLET | Freq: Every day | ORAL | 0 refills | Status: DC

## 2019-05-11 NOTE — Telephone Encounter (Signed)
Ok to send in refill for prednisone 10 mg po daily.  Dr. Estanislado Pandy would like the patient to continue to take prednisone 10 mg daily until her appointment.

## 2019-05-11 NOTE — Progress Notes (Signed)
Office Visit Note  Patient: [name]             Date of Birth: 23-Jan-1951           MRN: [id number]             PCP: [name], MD Referring: [name], MD Visit Date: 05/13/2019 Occupation: @GUAROCC @  Subjective:  Discuss medications   History of Present Illness: [name] is a 69 y.o. female with history of PMR and osteoarthritis.  She is currently on methotrexate 4 tablets by mouth once weekly, folic acid 1 mg by mouth daily, and prednisone 5 mg by mouth daily.  According to the patient she has not missed any doses of methotrexate recently.  She states 5 days ago she started to experience severe muscle pain and weakness in her lower extremities.  She was having a lot of discomfort in her buttocks region.  She was having difficulty getting up from a seated position and could not walk on Friday or Saturday.  She called the on-call service and was advised to increase her dose of prednisone from 3 mg daily to 5 mg daily.  She has been taking 5 mg of prednisone since Saturday and her symptoms resolved by 75% within about 36 hours.  She is not having any difficulty rising from a seated position at this time.  She denies any other joint pain or joint swelling at this time.  She has been seeing Dr. Lorin Mercy for her lower back pain.  She states her lower back pain is resolved. She reports that she continues to take Fosamax 70 mg 1 tablet by mouth once weekly.  Her PCP is interested in starting her on Prolia once she updates her bone density on 06/08/2019.   Activities of Daily Living:  Patient reports morning stiffness for 1 hour.   Patient Reports nocturnal pain.  Difficulty dressing/grooming: Denies Difficulty climbing stairs: Reports Difficulty getting out of chair: Reports Difficulty using hands for taps, buttons, cutlery, and/or writing: Denies  Review of Systems  Constitutional: Positive for fatigue.  HENT: Negative for mouth sores, mouth dryness and nose dryness.    Eyes: Negative for pain, itching, visual disturbance and dryness.  Respiratory: Negative for cough, hemoptysis, shortness of breath and difficulty breathing.   Cardiovascular: Negative for chest pain, palpitations, hypertension and swelling in legs/feet.  Gastrointestinal: Negative for blood in stool, constipation and diarrhea.  Endocrine: Negative for increased urination.  Genitourinary: Negative for difficulty urinating and painful urination.  Musculoskeletal: Positive for arthralgias, joint pain, myalgias, muscle weakness, morning stiffness, muscle tenderness and myalgias. Negative for joint swelling.  Skin: Negative for color change, pallor, rash, hair loss, nodules/bumps, redness, skin tightness, ulcers and sensitivity to sunlight.  Allergic/Immunologic: Negative for susceptible to infections.  Neurological: Negative for dizziness, numbness, headaches and memory loss.  Hematological: Negative for swollen glands.  Psychiatric/Behavioral: Positive for sleep disturbance. Negative for depressed mood and confusion. The patient is not nervous/anxious.     PMFS History:  Patient Active Problem List   Diagnosis Date Noted  . Lumbar compression fracture ([place]) 05/11/2019  . Educated about COVID-19 virus infection 01/26/2019  . Dyslipidemia 01/26/2019  . Drug-induced myopathy 01/14/2019  . Haglund's deformity 01/12/2019  . Myalgia 12/30/2018  . High risk medication use 12/04/2018  . Recurrent major depressive disorder, in full remission ([place]) 09/03/2018  . Lower esophageal ring ([place]) 03/03/2018  . Osteoarthritis of right hip 12/02/2017  . GERD (gastroesophageal reflux disease) 12/15/2015  .  Polyp of colon, adenomatous 10/11/2014  . Vitamin D deficiency 05/26/2014  . Osteoporosis 05/26/2014  . Polymyalgia rheumatica ([place]) 12/01/2010  . CAD (coronary artery disease) 09/09/2007  . Hematuria 09/09/2007  . Depression 03/20/2007  . Hypothyroid 02/13/1999    Past Medical History:   Diagnosis Date  . Anxiety   . Arthritis   . CAD (coronary artery disease)   . Cataract   . Depression   . DISORDER, MENOPAUSAL NOS 03/20/2007   Qualifier: Diagnosis of  By: Arnoldo Morale MD, Balinda Quails   . GERD (gastroesophageal reflux disease)   . Hyperlipidemia   . Hypothyroidism   . MYOCARDIAL INFARCTION, HX OF 09/09/2007   Qualifier: Diagnosis of  By: Leanne Chang MD, Bruce    . Osteoporosis   . Polymyalgia rheumatica ([facility])     Family History  Problem Relation Age of Onset  . Hypertension Father   . Heart disease Father        Mi age 48, 86, 52 and 58.  . Cancer Father   . Hyperlipidemia Father   . Colon cancer Maternal Uncle   . Hypertension Mother   . Cancer Mother   . Hyperlipidemia Mother   . Pulmonary fibrosis Sister   . Epilepsy Son   . Healthy Son   . Healthy Son   . Rheum arthritis Niece   . Lupus Niece   . Rheum arthritis Niece    Past Surgical History:  Procedure Laterality Date  . CATARACT EXTRACTION    . CESAREAN SECTION    . CHOLECYSTECTOMY N/A 11/24/2012   Procedure: LAPAROSCOPIC CHOLECYSTECTOMY;  Surgeon: Ralene Ok, MD;  Location: [facility];  Service: General;  Laterality: N/A;  . COSMETIC SURGERY    . EYE SURGERY     eye lids lifted  . PTCA     Social History   Social History Narrative   Husband and 2 adult children live at home. No grandkids. Son with epilepsy.       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 No part time.    Immunization History  Administered Date(s) Administered  . Influenza Whole 11/09/2008  . Influenza, High Dose Seasonal PF 10/30/2016, 12/02/2017, 11/08/2018  . Influenza-Unspecified 11/08/2018  . Pneumococcal Conjugate-13 10/11/2014  . Pneumococcal Polysaccharide-23 12/15/2015  . Td 02/12/2006  . Tdap 04/26/2017  . Zoster 09/19/2010     Objective: Vital Signs: BP 138/88 (BP Location: Left Arm, Patient Position: Sitting, Cuff Size: Normal)   Pulse 73   Resp 15   Ht 5' 1.5" (1.562 m)   Wt 158 lb 3.2 oz (71.8 kg)   BMI 29.41  kg/m    Physical Exam Vitals and nursing note reviewed.  Constitutional:      Appearance: She is well-developed.  HENT:     Head: Normocephalic and atraumatic.  Eyes:     Conjunctiva/sclera: Conjunctivae normal.  Pulmonary:     Effort: Pulmonary effort is normal.  Abdominal:     General: Bowel sounds are normal.     Palpations: Abdomen is soft.  Musculoskeletal:     Cervical back: Normal range of motion.  Lymphadenopathy:     Cervical: No cervical adenopathy.  Skin:    General: Skin is warm and dry.     Capillary Refill: Capillary refill takes less than 2 seconds.  Neurological:     Mental Status: She is alert and oriented to person, place, and time.  Psychiatric:        Behavior: Behavior normal.      Musculoskeletal Exam:  C-spine, thoracic spine, lumbar spine good range of motion.  Shoulder joints, elbow joints, wrist joints, MCPs, PIPs and DIPs good range of motion with no synovitis. PIP and DIP thickening consistent with osteoarthritis of both hands. She has complete fist formation bilaterally. Right hip has a limited range of motion with discomfort. Left hip gets good range of motion with no discomfort. Knee joints, ankle joints, MTPs, PIPs and DIPs good range of motion with no synovitis.  No warmth or effusion of bilateral knee joints.  No tenderness or swelling of ankle joints.  She has tenderness over the left trochanteric bursa.  CDAI Exam: CDAI Score: - Patient Global: -; Provider Global: - Swollen: -; Tender: - Joint Exam 05/13/2019   No joint exam has been documented for this visit   There is currently no information documented on the homunculus. Go to the Rheumatology activity and complete the homunculus joint exam.  Investigation: No additional findings.  Imaging: MR LUMBAR SPINE WO CONTRAST  Result Date: 04/24/2019 CLINICAL DATA:  Chronic low back pain EXAM: MRI LUMBAR SPINE WITHOUT CONTRAST TECHNIQUE: Multiplanar, multisequence MR imaging of the lumbar  spine was performed. No intravenous contrast was administered. COMPARISON:  MRI of the lumbar spine June 12, 2014 FINDINGS: Segmentation:  Standard. Alignment:  There is a levoscoliosis of the lumbar spine. Vertebrae: Chronic fracture of the L1 vertebral body with loss of approximately 60% of the vertebral body height with mild retropulsion of the superior aspect of the posterior wall into the spinal canal without significant spinal canal stenosis. Marrow edema is seen in the is superior articular process of L5 on the left side related to degenerative changes. No acute fracture, evidence of discitis, or bone lesion. Conus medullaris and cauda equina: Conus extends to the T12-L1 level. Conus and cauda equina appear normal. Paraspinal and other soft tissues: Negative. Disc levels: T12-L1: Small retropulsion of the posterior wall into the spinal canal without significant spinal canal stenosis. Perineural cysts noted on the right side. There is no neural foraminal stenosis. L1-2: No spinal canal or neural foraminal stenosis. L2-3: Shallow disc bulge resulting in mild bilateral neural foraminal narrowing. No spinal canal stenosis. L3-4: Shallow disc bulge and mild facet degenerative changes with ligamentum flavum redundancy resulting in mild left neural foraminal narrowing. No spinal canal stenosis. L4-5: Disc bulge with superimposed left central disc protrusion, facet degenerative changes with mild ligamentum flavum redundancy resulting in mild spinal canal stenosis with narrowing of the left subarticular zone, mild right and moderate left neural foraminal narrowing. L5-S1: Facet degenerative changes, left greater than right. No spinal canal or neural foraminal stenosis. IMPRESSION: 1. No acute fracture of the lumbar spine. 2. Chronic L1 compression fracture with loss of approximately 60% of vertebral body height with mild retropulsion of the posterior wall into the spinal canal without significant spinal canal  stenosis. 3. Multilevel degenerative changes of the lumbar spine, worst at L4-L5 with mild spinal canal stenosis, narrowing of the left subarticular zone, mild right and moderate left neural foraminal narrowing. Electronically Signed   By: Pedro Earls M.D.   On: 04/24/2019 11:13    Recent Labs: Lab Results  Component Value Date   WBC 6.6 02/19/2019   HGB 13.4 02/19/2019   PLT 246 02/19/2019   NA 141 02/19/2019   K 3.7 02/19/2019   CL 106 02/19/2019   CO2 28 02/19/2019   GLUCOSE 126 (H) 02/19/2019   BUN 14 02/19/2019   CREATININE 0.90 02/19/2019   BILITOT 0.6 02/19/2019  ALKPHOS 102 01/01/2019   AST 17 02/19/2019   ALT 16 02/19/2019   PROT 5.9 (L) 02/19/2019   ALBUMIN 3.7 01/01/2019   CALCIUM 9.1 02/19/2019   GFRAA 76 02/19/2019   QFTBGOLDPLUS NEGATIVE 08/12/2018    Speciality Comments: No specialty comments available.  Procedures:  No procedures performed Allergies: Morphine and related, Prochlorperazine, Simvastatin, and Sulfa antibiotics   Assessment / Plan:     Visit Diagnoses: Polymyalgia rheumatica ([place]) - Diagnosed 7 years ago by rheumatologist: She has been trying to taper prednisone down from 10 mg daily and reached 3 mg until she started developing signs and symptoms of a PMR flare 5 days ago. She has been taking methotrexate 4 tablets by mouth once weekly, folic acid 1 mg by mouth daily, and prednisone 3 mg by mouth daily, and she developed severe muscle tenderness and muscle weakness in bilateral lower extremities. She was having difficulty walking and rising from a seated position. She increased the prednisone dose to 5 mg daily and her symptoms resolved by 75% within 36 hours. She has full strength of upper and lower extremities on exam today. She had no difficulty rising from a seated position or raising her arms above her head. She is not experiencing any myalgias or arthralgias at this time. She will continue taking prednisone 5 mg by mouth daily.  We discussed increasing the dose of methotrexate from 4 tablets weekly to 6 tablets by mouth once weekly. We will check a sed rate today. She was advised to notify us if she develops signs or symptoms of a flare. She will follow-up in the office in 1 month.- Plan: Sedimentation rate  Long term (current) use of systemic steroids -Prednisone 5 mg po daily.   High risk medication use - Methotrexate 6 tablet by mouth once weekly and folic acid 1 mg po daily. CBC and CMP will be drawn today to monitor for drug toxicity.   - Plan: CBC with Differential/Platelet, COMPLETE METABOLIC PANEL WITH GFR  Age-related osteoporosis without current pathological fracture - DEXA on 06/04/17: total left hip BMD 0.619 with T-score -2.6. DEXA ordered by PCP and is scheduled for 06/08/2019.  She is taking Fosamax 70 mg 1 tablet by mouth once weekly. According to the patient her PCP is interested in switching her from Fosamax to Prolia after she updates her bone density in April.  Vitamin D deficiency  Trochanteric bursitis, left hip: She has tenderness over the left trochanter bursa on exam.  Primary osteoarthritis of both hands: She has PIP and DIP thickening consistent with osteoarthritis of both hands. She has complete fist formation bilaterally. She is no tenderness or synovitis on exam. Joint protection and muscle strengthening were discussed.  Primary osteoarthritis of both feet: She has no discomfort in her feet at this time.  Primary osteoarthritis of right hip: She has slightly limited range of motion with discomfort.  DDD (degenerative disc disease), lumbar: She has good range of motion. No midline spinal tenderness on exam today. She is followed by Dr. Lorin Mercy.  Other medical conditions are listed as follows:  Abnormal SPEP  History of hypothyroidism  Coronary artery disease involving native coronary artery of native heart without angina pectoris - She requested to have lipid panel checked today. plan:  Lipid panel  History of gastroesophageal reflux (GERD)  History of depression  Adenomatous polyp of colon, unspecified part of colon  Orders: Orders Placed This Encounter  Procedures  . Sedimentation rate  . Lipid panel  . CBC with  Differential/Platelet  . COMPLETE METABOLIC PANEL WITH GFR   No orders of the defined types were placed in this encounter.   Face-to-face time spent with patient was 30 minutes. Greater than 50% of time was spent in counseling and coordination of care.  Follow-Up Instructions: Return in about 4 weeks (around 06/10/2019) for Polymyalgia Rheumatica.   Ofilia Neas, PA-C   I examined and evaluated the patient with Meghan Sams PA.  It appears that patient had a flare on prednisone 3 mg p.o. daily.  Her symptoms have improved since we increased her prednisone to 5 mg p.o. daily.  Will obtain sedimentation rate today.  The plan of care was discussed as noted above.  Bo Merino, MD  Note - This record has been created using Editor, commissioning.  Chart creation errors have been sought, but may not always  have been located. Such creation errors do not reflect on  the standard of medical care.

## 2019-05-12 ENCOUNTER — Ambulatory Visit: Payer: PPO | Admitting: Physician Assistant

## 2019-05-13 ENCOUNTER — Encounter: Payer: Self-pay | Admitting: Rheumatology

## 2019-05-13 ENCOUNTER — Ambulatory Visit (INDEPENDENT_AMBULATORY_CARE_PROVIDER_SITE_OTHER): Payer: PPO | Admitting: Rheumatology

## 2019-05-13 ENCOUNTER — Other Ambulatory Visit: Payer: Self-pay

## 2019-05-13 VITALS — BP 138/88 | HR 73 | Resp 15 | Ht 61.5 in | Wt 158.2 lb

## 2019-05-13 DIAGNOSIS — M19041 Primary osteoarthritis, right hand: Secondary | ICD-10-CM

## 2019-05-13 DIAGNOSIS — R778 Other specified abnormalities of plasma proteins: Secondary | ICD-10-CM | POA: Diagnosis not present

## 2019-05-13 DIAGNOSIS — Z8659 Personal history of other mental and behavioral disorders: Secondary | ICD-10-CM

## 2019-05-13 DIAGNOSIS — E559 Vitamin D deficiency, unspecified: Secondary | ICD-10-CM | POA: Diagnosis not present

## 2019-05-13 DIAGNOSIS — I2510 Atherosclerotic heart disease of native coronary artery without angina pectoris: Secondary | ICD-10-CM

## 2019-05-13 DIAGNOSIS — M1611 Unilateral primary osteoarthritis, right hip: Secondary | ICD-10-CM

## 2019-05-13 DIAGNOSIS — M810 Age-related osteoporosis without current pathological fracture: Secondary | ICD-10-CM

## 2019-05-13 DIAGNOSIS — M19071 Primary osteoarthritis, right ankle and foot: Secondary | ICD-10-CM

## 2019-05-13 DIAGNOSIS — M353 Polymyalgia rheumatica: Secondary | ICD-10-CM

## 2019-05-13 DIAGNOSIS — M7062 Trochanteric bursitis, left hip: Secondary | ICD-10-CM

## 2019-05-13 DIAGNOSIS — Z7952 Long term (current) use of systemic steroids: Secondary | ICD-10-CM

## 2019-05-13 DIAGNOSIS — D126 Benign neoplasm of colon, unspecified: Secondary | ICD-10-CM

## 2019-05-13 DIAGNOSIS — M5136 Other intervertebral disc degeneration, lumbar region: Secondary | ICD-10-CM

## 2019-05-13 DIAGNOSIS — Z8639 Personal history of other endocrine, nutritional and metabolic disease: Secondary | ICD-10-CM | POA: Diagnosis not present

## 2019-05-13 DIAGNOSIS — Z79899 Other long term (current) drug therapy: Secondary | ICD-10-CM | POA: Diagnosis not present

## 2019-05-13 DIAGNOSIS — M19072 Primary osteoarthritis, left ankle and foot: Secondary | ICD-10-CM

## 2019-05-13 DIAGNOSIS — Z8719 Personal history of other diseases of the digestive system: Secondary | ICD-10-CM

## 2019-05-13 DIAGNOSIS — M19042 Primary osteoarthritis, left hand: Secondary | ICD-10-CM

## 2019-05-13 LAB — COMPLETE METABOLIC PANEL WITH GFR
AG Ratio: 1.6 (calc) (ref 1.0–2.5)
ALT: 16 U/L (ref 6–29)
AST: 16 U/L (ref 10–35)
Albumin: 3.7 g/dL (ref 3.6–5.1)
Alkaline phosphatase (APISO): 90 U/L (ref 37–153)
BUN: 12 mg/dL (ref 7–25)
CO2: 30 mmol/L (ref 20–32)
Calcium: 9.3 mg/dL (ref 8.6–10.4)
Chloride: 106 mmol/L (ref 98–110)
Creat: 0.83 mg/dL (ref 0.50–0.99)
GFR, Est African American: 83 mL/min/{1.73_m2} (ref >=60)
GFR, Est Non African American: 72 mL/min/{1.73_m2} (ref >=60)
Globulin: 2.3 g/dL (calc) (ref 1.9–3.7)
Glucose, Bld: 105 mg/dL (ref 65–139)
Potassium: 4 mmol/L (ref 3.5–5.3)
Sodium: 141 mmol/L (ref 135–146)
Total Bilirubin: 0.4 mg/dL (ref 0.2–1.2)
Total Protein: 6 g/dL — ABNORMAL LOW (ref 6.1–8.1)

## 2019-05-13 LAB — CBC WITH DIFFERENTIAL/PLATELET
Absolute Monocytes: 415 cells/uL (ref 200–950)
Basophils Absolute: 50 cells/uL (ref 0–200)
Basophils Relative: 0.8 %
Eosinophils Absolute: 130 cells/uL (ref 15–500)
Eosinophils Relative: 2.1 %
HCT: 38.7 % (ref 35.0–45.0)
Hemoglobin: 12.6 g/dL (ref 11.7–15.5)
Lymphs Abs: 1085 cells/uL (ref 850–3900)
MCH: 30.7 pg (ref 27.0–33.0)
MCHC: 32.6 g/dL (ref 32.0–36.0)
MCV: 94.4 fL (ref 80.0–100.0)
MPV: 11 fL (ref 7.5–12.5)
Monocytes Relative: 6.7 %
Neutro Abs: 4520 cells/uL (ref 1500–7800)
Neutrophils Relative %: 72.9 %
Platelets: 273 10*3/uL (ref 140–400)
RBC: 4.1 10*6/uL (ref 3.80–5.10)
RDW: 13.5 % (ref 11.0–15.0)
Total Lymphocyte: 17.5 %
WBC: 6.2 10*3/uL (ref 3.8–10.8)

## 2019-05-13 LAB — LIPID PANEL
Cholesterol: 164 mg/dL (ref <200)
HDL: 63 mg/dL (ref >=50)
LDL Cholesterol (Calc): 87 mg/dL (calc)
Non-HDL Cholesterol (Calc): 101 mg/dL (calc) (ref <130)
Total CHOL/HDL Ratio: 2.6 (calc) (ref <5.0)
Triglycerides: 54 mg/dL (ref <150)

## 2019-05-13 LAB — SEDIMENTATION RATE: Sed Rate: 28 mm/h (ref 0–30)

## 2019-05-14 ENCOUNTER — Telehealth: Payer: Self-pay | Admitting: Rheumatology

## 2019-05-14 NOTE — Telephone Encounter (Signed)
Patient called stating she was returning your Amber's call.

## 2019-05-14 NOTE — Telephone Encounter (Signed)
Attempted to contact patient and left message for patient to call the office.  

## 2019-05-14 NOTE — Progress Notes (Signed)
Total protein borderline low-6.0 but stable.  Rest of CMP WNL. CBC WNL. Lipid panel WNL.  ESR WNL.

## 2019-05-18 ENCOUNTER — Encounter: Payer: Self-pay | Admitting: Rheumatology

## 2019-05-18 NOTE — Telephone Encounter (Signed)
She does not need to have cortisol level checked at this time.  She had to increase her dose of prednisone due to experiencing symptoms of a PMR flare recently. We will discuss further at her next follow up visit.

## 2019-05-27 ENCOUNTER — Ambulatory Visit: Payer: PPO | Admitting: Rehabilitative and Restorative Service Providers"

## 2019-05-27 ENCOUNTER — Other Ambulatory Visit: Payer: Self-pay

## 2019-05-27 ENCOUNTER — Encounter: Payer: Self-pay | Admitting: Rehabilitative and Restorative Service Providers"

## 2019-05-27 DIAGNOSIS — G8929 Other chronic pain: Secondary | ICD-10-CM

## 2019-05-27 DIAGNOSIS — R293 Abnormal posture: Secondary | ICD-10-CM

## 2019-05-27 DIAGNOSIS — M545 Low back pain: Secondary | ICD-10-CM | POA: Diagnosis not present

## 2019-05-27 DIAGNOSIS — R262 Difficulty in walking, not elsewhere classified: Secondary | ICD-10-CM | POA: Diagnosis not present

## 2019-05-27 DIAGNOSIS — M6281 Muscle weakness (generalized): Secondary | ICD-10-CM

## 2019-05-27 NOTE — Patient Instructions (Signed)
Access Code: [email or web address] URL: [email or web address] Date: 05/27/2019 Prepared by: Scot Jun  Exercises Supine Lower Trunk Rotation - 2 x daily - 7 x weekly - 5 reps - 1 sets - 15 hold Left Standing Lateral Shift Correction at Wall - Repetitions - 1 x daily - 7 x weekly - 3 sets - 10 reps - 2-3 hold Standing Lumbar Extension - 2 x daily - 7 x weekly - 10 reps - 2-3 sets

## 2019-05-27 NOTE — Therapy (Addendum)
[facility] Physical Therapy [address] [place], [place], [address] Phone: [phone]   Fax:  [phone]  Physical Therapy Evaluation /Discharge   Patient Details  Name: [name] MRN: [id number] Date of Birth: 1950/05/20 Referring Provider (PT): Dr. Lorin Mercy   Encounter Date: 05/27/2019  PT End of Session - 05/27/19 1040     Visit Number  1    Number of Visits  12    Date for PT Re-Evaluation  06/24/19    PT Start Time  1041    PT Stop Time  3559    PT Time Calculation (min)  43 min    Activity Tolerance  Patient tolerated treatment well    Behavior During Therapy  [facility] for tasks assessed/performed        Past Medical History:  Diagnosis Date   Anxiety    Arthritis    CAD (coronary artery disease)    Cataract    Depression    DISORDER, MENOPAUSAL NOS 03/20/2007   Qualifier: Diagnosis of  By: Arnoldo Morale MD, Balinda Quails    GERD (gastroesophageal reflux disease)    Hyperlipidemia    Hypothyroidism    MYOCARDIAL INFARCTION, HX OF 09/09/2007   Qualifier: Diagnosis of  By: Leanne Chang MD, Bruce     Osteoporosis    Polymyalgia rheumatica [facility])     Past Surgical History:  Procedure Laterality Date   CATARACT EXTRACTION     CESAREAN SECTION     CHOLECYSTECTOMY N/A 11/24/2012   Procedure: LAPAROSCOPIC CHOLECYSTECTOMY;  Surgeon: Ralene Ok, MD;  Location: [place];  Service: General;  Laterality: N/A;   COSMETIC SURGERY     EYE SURGERY     eye lids lifted   PTCA      There were no vitals filed for this visit.   Subjective Assessment - 05/27/19 1045     Subjective  Pt. indicated history of chronic low back pain.  Pt. stated pain starts to hurt more at lunch and gets worse during the rest of the day.  Pt. indicated BLE pains present, Rt > Lt.  Pain in thighs reported, some in Rt lateral hip.    Diagnostic tests  MRI was performed    Currently in Pain?  Yes    Pain Score  0-No pain   pain at worst 10/10.   Pain Location  Back    Pain Orientation   Left;Right    Pain Descriptors / Indicators  Constant;Spasm    Pain Type  Chronic pain    Pain Radiating Towards  Anterior thigh bilateral.    Pain Onset  More than a month ago    Pain Frequency  Constant    Aggravating Factors   Bending, lifting, standing/walking prolonged, sitting prolonged.  Pt. stated trouble sleeping 3-4x/week.    Pain Relieving Factors  Rest, take medicine.    Effect of Pain on Daily Activities  Limited yardwork, shopping, prolonged walking.          [facility] PT Assessment - 05/27/19 0001       Assessment   Medical Diagnosis  Chronic Low Back Pain    Referring Provider (PT)  Dr. Lorin Mercy    Onset Date/Surgical Date  03/16/19    Hand Dominance  Right      Precautions   Precautions  None      Restrictions   Weight Bearing Restrictions  No      Balance Screen   Has the patient fallen in the past 6 months  No  Has the patient had a decrease in activity level because of a fear of falling?   Yes   2/2 symptoms   Is the patient reluctant to leave their home because of a fear of falling?   No      Home Environment   Living Environment  Private residence    Living Arrangements  Spouse/significant other    Home Access  Stairs to enter    Entrance Stairs-Number of Steps  1      Prior Function   Level of Independence  Independent    Vocation  Full time employment    Occupational psychologist in pediatric       Cognition   Overall Cognitive Status  Within Functional Limits for tasks assessed      Posture/Postural Control   Posture Comments  Lt shoulder lateral shift on hips mild, easily manuall corrected.    Manual shift correction improved posture     ROM / Strength   AROM / PROM / Strength  Strength;PROM;AROM      AROM   AROM Assessment Site  Lumbar;Hip    Right/Left Hip  Left;Right    Lumbar Flexion  to patella, low back pain    Lumbar Extension  50%      PROM   PROM Assessment Site  Hip;Knee;Ankle    Right/Left Hip  Left;Right     Right/Left Knee  Left;Right    Right/Left Ankle  Left;Right      Strength   Strength Assessment Site  Ankle;Knee;Hip    Right/Left Hip  Left;Right    Right Hip Flexion  5/5    Left Hip Flexion  5/5    Right/Left Knee  Left;Right    Right Knee Flexion  5/5    Right Knee Extension  5/5    Left Knee Flexion  5/5    Left Knee Extension  5/5    Right/Left Ankle  Left;Right    Right Ankle Dorsiflexion  5/5    Left Ankle Dorsiflexion  5/5      Palpation   SI assessment   mild restriction cPA L3-L5 c concordant symptom      Special Tests    Special Tests  Lumbar    Lumbar Tests  Slump Test      Slump test   Comment  Bilateral testing showed posterior knee tightness reported but non concordant symptom                 Objective measurements completed on examination: See above findings.      [facility] Adult PT Treatment/Exercise - 05/27/19 0001       Exercises   Exercises  Lumbar      Lumbar Exercises: Stretches   Lower Trunk Rotation  5 reps;Other (comment)   15 seconds x 5 bilateral   Other Lumbar Stretch Exercise  standing lumbar ext x 10, standing lateral shift correction c wall at left x 10      Modalities   Modalities  Electrical Stimulation      Electrical Stimulation   Electrical Stimulation Location  lumbar    Electrical Stimulation Action  ifc 10 mins    Electrical Stimulation Parameters  to tolerance    Electrical Stimulation Goals  Pain      Manual Therapy   Manual therapy comments  g2 cPA L3-L5, Rt lateral shift correction manual x 10              PT Education - 05/27/19 1128  Education Details  HEP, POC    Person(s) Educated  Patient    Methods  Explanation;Demonstration;Verbal cues;Handout    Comprehension  Verbalized understanding;Returned demonstration           PT Long Term Goals - 05/27/19 1128       PT LONG TERM GOAL #1   Title  Patient will demonstrate/report pain at worst less than or equal to 2/10 to facilitate  minimal limitation in daily activity secondary to pain symptoms.    Time  6    Period  Weeks    Status  New    Target Date  07/08/19      PT LONG TERM GOAL #2   Title  Patient will demonstrate independent use of home exercise program to facilitate ability to maintain/progress functional gains from skilled physical therapy services.    Time  6    Period  Weeks    Status  New    Target Date  07/08/19      PT LONG TERM GOAL #3   Title  Patient will demonstrate return to work/recreational activity at previous level of function without limitations secondary due to condition    Time  6    Period  Weeks    Status  New    Target Date  07/08/19      PT LONG TERM GOAL #4   Title  Pt. will demonstrate standing posture s limitation c lateral shift corrected for standing/walking posture at PLOF.    Time  6    Period  Weeks    Status  New    Target Date  07/08/19      PT LONG TERM GOAL #5   Title  Pt. will demonstrate lumbar ext 100 % WFL s symptoms to facilitate upright standing/walking posture at PLOF.    Time  6    Period  Weeks    Status  New    Target Date  07/08/19              Plan - 05/27/19 1038     Clinical Impression Statement  Patient is a 69 y.o. female who comes to clinic with complaints of low back, BLE pain with mobility, strength and movement coordination deficits that impair her ability to perform usual daily and recreational functional activities without increase difficulty/symptoms at this time.  Patient to benefit from skilled PT services to address impairments and limitations to improve to previous level of function without restriction secondary to condition.    Personal Factors and Comorbidities  Comorbidity 3+    Comorbidities  MI 2009, osteoporosis, CAD, hypothyroidism, hyperlipidemia, polymyalgia rheumatica, history of L1 compression fracture    Stability/Clinical Decision Making  Evolving/Moderate complexity    Clinical Decision Making  Moderate    Rehab  Potential  Fair   fair to good (medical history factoring)   PT Frequency  2x / week    PT Duration  6 weeks    PT Treatment/Interventions  ADLs/Self Care Home Management;Electrical Stimulation;Ultrasound;Traction;Iontophoresis 6m/ml Dexamethasone;Gait training;Stair training;Functional mobility training;Moist Heat;Therapeutic activities;Therapeutic exercise;Balance training;Neuromuscular re-education;Manual techniques;Patient/family education;Passive range of motion;Dry needling;Joint Manipulations;Taping    PT Next Visit Plan  Reassess HEP, improve activity tolerance, reduced sensitization, improve mobilty.    PT Home Exercise Plan  [id number]   Consulted and Agree with Plan of Care  Patient        Patient will benefit from skilled therapeutic intervention in order to improve the following deficits and impairments:  Abnormal gait, Decreased range  of motion, Decreased coordination, Difficulty walking, Pain, Increased muscle spasms, Decreased activity tolerance, Hypomobility, Decreased balance, Decreased mobility, Decreased strength, Postural dysfunction  Visit Diagnosis: Chronic low back pain, unspecified back pain laterality, unspecified whether sciatica present - Plan: PT plan of care cert/re-cert  Muscle weakness (generalized) - Plan: PT plan of care cert/re-cert  Difficulty in walking, not elsewhere classified - Plan: PT plan of care cert/re-cert  Abnormal posture - Plan: PT plan of care cert/re-cert     Problem List Patient Active Problem List   Diagnosis Date Noted   Lumbar compression fracture ([place]) 05/11/2019   Educated about COVID-19 virus infection 01/26/2019   Dyslipidemia 01/26/2019   Drug-induced myopathy 01/14/2019   Haglund's deformity 01/12/2019   Myalgia 12/30/2018   High risk medication use 12/04/2018   Recurrent major depressive disorder, in full remission ([place]) 09/03/2018   Lower esophageal ring ([place]) 03/03/2018   Osteoarthritis of right hip 12/02/2017    GERD (gastroesophageal reflux disease) 12/15/2015   Polyp of colon, adenomatous 10/11/2014   Vitamin D deficiency 05/26/2014   Osteoporosis 05/26/2014   Polymyalgia rheumatica ([place]) 12/01/2010   CAD (coronary artery disease) 09/09/2007   Hematuria 09/09/2007   Depression 03/20/2007   Hypothyroid 02/13/1999   Scot Jun, PT, DPT, OCS, ATC 05/27/19  11:32 AM   PHYSICAL THERAPY DISCHARGE SUMMARY  Visits from Start of Care: 1  Current functional level related to goals / functional outcomes: See note   Remaining deficits: See note   Education / Equipment: HEP   Patient agrees to discharge. Patient goals were not met. Patient is being discharged due to not returning since the last visit.  Scot Jun, PT, DPT, OCS, ATC 04/13/21  11:44 AM       [facility] Physical Therapy [address] [place], [place], [address] Phone: [phone]   Fax:  [phone]  Name: [name] MRN: [id number] Date of Birth: Apr 24, 1950

## 2019-06-01 ENCOUNTER — Other Ambulatory Visit: Payer: Self-pay

## 2019-06-01 ENCOUNTER — Ambulatory Visit: Payer: Self-pay

## 2019-06-01 ENCOUNTER — Encounter: Payer: Self-pay | Admitting: Physician Assistant

## 2019-06-01 ENCOUNTER — Ambulatory Visit (INDEPENDENT_AMBULATORY_CARE_PROVIDER_SITE_OTHER): Payer: PPO | Admitting: Physician Assistant

## 2019-06-01 ENCOUNTER — Encounter: Payer: PPO | Admitting: Rehabilitative and Restorative Service Providers"

## 2019-06-01 VITALS — BP 133/85 | HR 69 | Resp 13 | Ht 61.5 in | Wt 157.0 lb

## 2019-06-01 DIAGNOSIS — M7062 Trochanteric bursitis, left hip: Secondary | ICD-10-CM

## 2019-06-01 DIAGNOSIS — M5136 Other intervertebral disc degeneration, lumbar region: Secondary | ICD-10-CM | POA: Diagnosis not present

## 2019-06-01 DIAGNOSIS — M353 Polymyalgia rheumatica: Secondary | ICD-10-CM | POA: Diagnosis not present

## 2019-06-01 DIAGNOSIS — Z7952 Long term (current) use of systemic steroids: Secondary | ICD-10-CM | POA: Diagnosis not present

## 2019-06-01 DIAGNOSIS — Z8719 Personal history of other diseases of the digestive system: Secondary | ICD-10-CM

## 2019-06-01 DIAGNOSIS — E559 Vitamin D deficiency, unspecified: Secondary | ICD-10-CM | POA: Diagnosis not present

## 2019-06-01 DIAGNOSIS — M1611 Unilateral primary osteoarthritis, right hip: Secondary | ICD-10-CM

## 2019-06-01 DIAGNOSIS — Z8639 Personal history of other endocrine, nutritional and metabolic disease: Secondary | ICD-10-CM

## 2019-06-01 DIAGNOSIS — I2510 Atherosclerotic heart disease of native coronary artery without angina pectoris: Secondary | ICD-10-CM

## 2019-06-01 DIAGNOSIS — M19042 Primary osteoarthritis, left hand: Secondary | ICD-10-CM

## 2019-06-01 DIAGNOSIS — M810 Age-related osteoporosis without current pathological fracture: Secondary | ICD-10-CM

## 2019-06-01 DIAGNOSIS — M25552 Pain in left hip: Secondary | ICD-10-CM

## 2019-06-01 DIAGNOSIS — Z8659 Personal history of other mental and behavioral disorders: Secondary | ICD-10-CM

## 2019-06-01 DIAGNOSIS — M51369 Other intervertebral disc degeneration, lumbar region without mention of lumbar back pain or lower extremity pain: Secondary | ICD-10-CM

## 2019-06-01 DIAGNOSIS — M19071 Primary osteoarthritis, right ankle and foot: Secondary | ICD-10-CM | POA: Diagnosis not present

## 2019-06-01 DIAGNOSIS — R778 Other specified abnormalities of plasma proteins: Secondary | ICD-10-CM | POA: Diagnosis not present

## 2019-06-01 DIAGNOSIS — M19041 Primary osteoarthritis, right hand: Secondary | ICD-10-CM | POA: Diagnosis not present

## 2019-06-01 DIAGNOSIS — M19072 Primary osteoarthritis, left ankle and foot: Secondary | ICD-10-CM

## 2019-06-01 DIAGNOSIS — Z79899 Other long term (current) drug therapy: Secondary | ICD-10-CM

## 2019-06-01 DIAGNOSIS — D126 Benign neoplasm of colon, unspecified: Secondary | ICD-10-CM

## 2019-06-01 NOTE — Progress Notes (Signed)
Office Visit Note  Patient: [name]             Date of Birth: 1950/06/25           MRN: [id number]             PCP: [name], MD Referring: [name], MD Visit Date: 06/01/2019 Occupation: @GUAROCC @  Subjective:  Left hip pain   History of Present Illness: [name] is a 69 y.o. female with history of polymyalgia rheumatica, osteoarthritis, and osteoporosis.  Patient is taking methotrexate 4 tablets by mouth once weekly and folic acid 1 mg by mouth daily.  She is on prednisone 5 mg 1 tablet daily.  She denies any signs or symptoms of a polymyalgia rheumatica flare since her last visit.  She continues to have chronic lower back pain and started going to physical therapy.  She states that she had to cancel her physical therapy appointment for today due to experiencing severe left hip and groin pain since 3 AM this morning.  She is having difficulty lying on her left side as well as with ambulation due to the discomfort.  She states the pain is radiating into her left groin.  She continues to have recurrent trochanter bursitis of the left hip.  She performs daily stretching exercises.  She denies any other joint pain or joint swelling at this time    Activities of Daily Living:  Patient reports morning stiffness for 30 minutes.   Patient Denies nocturnal pain.  Difficulty dressing/grooming: Denies Difficulty climbing stairs: Reports Difficulty getting out of chair: Denies Difficulty using hands for taps, buttons, cutlery, and/or writing: Denies  Review of Systems  Constitutional: Positive for fatigue.  HENT: Negative for mouth sores, mouth dryness and nose dryness.   Eyes: Negative for pain, itching, visual disturbance and dryness.  Respiratory: Negative for cough, hemoptysis, shortness of breath and difficulty breathing.   Cardiovascular: Negative for chest pain, palpitations, hypertension and swelling in legs/feet.  Gastrointestinal: Negative for blood in  stool, constipation and diarrhea.  Endocrine: Negative for increased urination.  Genitourinary: Negative for difficulty urinating and painful urination.  Musculoskeletal: Positive for arthralgias, joint pain, myalgias, morning stiffness, muscle tenderness and myalgias. Negative for joint swelling and muscle weakness.  Skin: Negative for color change, pallor, rash, hair loss, nodules/bumps, redness, skin tightness, ulcers and sensitivity to sunlight.  Allergic/Immunologic: Negative for susceptible to infections.  Neurological: Negative for dizziness, light-headedness, numbness, headaches, memory loss and weakness.  Hematological: Negative for bruising/bleeding tendency and swollen glands.  Psychiatric/Behavioral: Negative for depressed mood, confusion and sleep disturbance. The patient is not nervous/anxious.     PMFS History:  Patient Active Problem List   Diagnosis Date Noted  . Lumbar compression fracture ([place]) 05/11/2019  . Educated about COVID-19 virus infection 01/26/2019  . Dyslipidemia 01/26/2019  . Drug-induced myopathy 01/14/2019  . Haglund's deformity 01/12/2019  . Myalgia 12/30/2018  . High risk medication use 12/04/2018  . Recurrent major depressive disorder, in full remission ([place]) 09/03/2018  . Lower esophageal ring ([place]) 03/03/2018  . Osteoarthritis of right hip 12/02/2017  . GERD (gastroesophageal reflux disease) 12/15/2015  . Polyp of colon, adenomatous 10/11/2014  . Vitamin D deficiency 05/26/2014  . Osteoporosis 05/26/2014  . Polymyalgia rheumatica ([place]) 12/01/2010  . CAD (coronary artery disease) 09/09/2007  . Hematuria 09/09/2007  . Depression 03/20/2007  . Hypothyroid 02/13/1999    Past Medical History:  Diagnosis Date  . Anxiety   . Arthritis   .  CAD (coronary artery disease)   . Cataract   . Depression   . DISORDER, MENOPAUSAL NOS 03/20/2007   Qualifier: Diagnosis of  By: Arnoldo Morale MD, Balinda Quails   . GERD (gastroesophageal reflux disease)   .  Hyperlipidemia   . Hypothyroidism   . MYOCARDIAL INFARCTION, HX OF 09/09/2007   Qualifier: Diagnosis of  By: Leanne Chang MD, Bruce    . Osteoporosis   . Polymyalgia rheumatica (H[facility])     Family History  Problem Relation Age of Onset  . Hypertension Father   . Heart disease Father        Mi age 60, 68, 83 and 26.  . Cancer Father   . Hyperlipidemia Father   . Colon cancer Maternal Uncle   . Hypertension Mother   . Cancer Mother   . Hyperlipidemia Mother   . Pulmonary fibrosis Sister   . Epilepsy Son   . Healthy Son   . Healthy Son   . Rheum arthritis Niece   . Lupus Niece   . Rheum arthritis Niece    Past Surgical History:  Procedure Laterality Date  . CATARACT EXTRACTION    . CESAREAN SECTION    . CHOLECYSTECTOMY N/A 11/24/2012   Procedure: LAPAROSCOPIC CHOLECYSTECTOMY;  Surgeon: Ralene Ok, MD;  Location: [place];  Service: General;  Laterality: N/A;  . COSMETIC SURGERY    . EYE SURGERY     eye lids lifted  . PTCA     Social History   Social History Narrative   Husband and 2 adult children live at home. No grandkids. Son with epilepsy.       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 No part time.    Immunization History  Administered Date(s) Administered  . Influenza Whole 11/09/2008  . Influenza, High Dose Seasonal PF 10/30/2016, 12/02/2017, 11/08/2018  . Influenza-Unspecified 11/08/2018  . Pneumococcal Conjugate-13 10/11/2014  . Pneumococcal Polysaccharide-23 12/15/2015  . Td 02/12/2006  . Tdap 04/26/2017  . Zoster 09/19/2010     Objective: Vital Signs: BP 133/85 (BP Location: Left Arm, Patient Position: Sitting, Cuff Size: Normal)   Pulse 69   Resp 13   Ht 5' 1.5" (1.562 m)   Wt 157 lb (71.2 kg)   BMI 29.18 kg/m    Physical Exam Vitals and nursing note reviewed.  Constitutional:      Appearance: She is well-developed.  HENT:     Head: Normocephalic and atraumatic.  Eyes:     Conjunctiva/sclera: Conjunctivae normal.  Pulmonary:     Effort:  Pulmonary effort is normal.  Abdominal:     General: Bowel sounds are normal.     Palpations: Abdomen is soft.  Musculoskeletal:     Cervical back: Normal range of motion.  Lymphadenopathy:     Cervical: No cervical adenopathy.  Skin:    General: Skin is warm and dry.     Capillary Refill: Capillary refill takes less than 2 seconds.  Neurological:     Mental Status: She is alert and oriented to person, place, and time.  Psychiatric:        Behavior: Behavior normal.      Musculoskeletal Exam: C-spine good range of motion.  Painful limited range of motion of lumbar spine.  Shoulder joints, elbow joints, wrist joints, MCPs, PIPs and DIPs good range of motion with no synovitis.  She has PIP and DIP thickening consistent with osteoarthritis of both hands.  She has complete fist formation bilaterally.  Right hip has limited range of motion with  no discomfort.  Left hip has painful and limited range of motion.  She has tenderness over the left trochanteric bursa.  Knee joints have good range of motion with no warmth or effusion.  Ankle joints have good range of motion with no tenderness or warmth.  CDAI Exam: CDAI Score: -- Patient Global: --; Provider Global: -- Swollen: --; Tender: -- Joint Exam 06/01/2019   No joint exam has been documented for this visit   There is currently no information documented on the homunculus. Go to the Rheumatology activity and complete the homunculus joint exam.  Investigation: No additional findings.  Imaging: XR HIP UNILAT W OR W/O PELVIS 2-3 VIEWS LEFT  Result Date: 06/01/2019 No SI joint to sclerosis or narrowing was noted.  No significant hip joint narrowing was noted.  Mild spurring was noted.  Degenerative changes were noted in the lumbar spine. Impression.  No significant arthritis was noted in the hip joint.  Degenerative changes were noted in the lumbar spine.   Recent Labs: Lab Results  Component Value Date   WBC 6.2 05/13/2019   HGB  12.6 05/13/2019   PLT 273 05/13/2019   NA 141 05/13/2019   K 4.0 05/13/2019   CL 106 05/13/2019   CO2 30 05/13/2019   GLUCOSE 105 05/13/2019   BUN 12 05/13/2019   CREATININE 0.83 05/13/2019   BILITOT 0.4 05/13/2019   ALKPHOS 102 01/01/2019   AST 16 05/13/2019   ALT 16 05/13/2019   PROT 6.0 (L) 05/13/2019   ALBUMIN 3.7 01/01/2019   CALCIUM 9.3 05/13/2019   GFRAA 83 05/13/2019   QFTBGOLDPLUS NEGATIVE 08/12/2018    Speciality Comments: No specialty comments available.  Procedures:  No procedures performed Allergies: Morphine and related, Prochlorperazine, Simvastatin, and Sulfa antibiotics   Assessment / Plan:     Visit Diagnoses: Polymyalgia rheumatica ([place]): Diagnosed 7 years ago by her previous rheumatologist. She had a flare in March 2021 while trying to taper prednisone. She has noticed significant clinical improvement since increasing the dose of prednisone from 3 mg to 5 mg daily. She is tolerating methotrexate 4 tablets by mouth once weekly and folic acid 1 mg by mouth daily. She is not experiencing increased muscle weakness or muscle tenderness at this time. She has full strength of upper and lower extremities on exam. She has no difficulty rising from a seated position or raising her arms above her head at this time. She will continue on methotrexate and prednisone as prescribed. She was advised to notify us if she develops any signs or symptoms of a flare. She will follow-up in the office in 3 months.  Long term (current) use of systemic steroids: Prednisone 5 mg 1 tablet by mouth daily. Unable to taper at this time.   High risk medication use: Methotrexate 4 tablets by mouth once weekly and folic acid 1 mg by mouth daily. Prednisone 5 mg 1 tablet daily. CBC and CMP were drawn on 05/13/2019. She will be due to update lab work in June and every 3 months.  Age-related osteoporosis without current pathological fracture: DEXA on 06/04/17: total left hip BMD 0.619 with T-score -2.6.  She has a history of L1 compression fracture. DEXA ordered by PCP and is scheduled for 06/08/2019.  She continues take Fosamax 70 mg 1 tablet by mouth once weekly. We will further discuss treatment options pending DEXA results. Her PCP has discussed switching her from Fosamax to Prolia if for her bone density has worsened.   Vitamin D deficiency  Trochanteric bursitis, left hip: She has tenderness to palpation over the left trochanteric bursa.  Most of her discomfort is in the left groin.  Primary osteoarthritis of both hands: She has PIP and DIP thickening consistent with osteoarthritis of both hands. No tenderness or synovitis was noted. She has complete fist formation bilaterally. Joint protection and muscle strengthening were discussed.  Primary osteoarthritis of both feet: She is not experiencing any discomfort in her feet at this time. She was proper fitting shoes.  Primary osteoarthritis of right hip: She has limited ROM of the right hip joint with no discomfort.   Pain in left hip -She presents today with left hip joint pain which started at 3 AM last night. Patient denies any injury prior to the onset of symptoms. She states that the discomfort woke her up during the night. She has difficulty lying on the left side and has had a difficulty ambulating due to the discomfort. She has painful limited range of motion of the left hip joint on exam. She continues to have tenderness to palpation over the left trochanteric bursa as well. X-rays of the left hip were obtained today which were unremarkable. She plans on following up with Dr. Lorin Mercy for further evaluation and treatment. Plan: XR HIP UNILAT W OR W/O PELVIS 2-3 VIEWS LEFT  DDD (degenerative disc disease), lumbar: Chronic pain. MRI of the lumbar spine was obtained on 04/23/2019 which revealed chronic L1 compression fracture and multilevel generative changes of the lumbar spine worst at L4-L5 with mild spinal canal stenosis. She has been seeing  Dr. Lorin Mercy on a regular basis. She started physical therapy and has had one session. She had to cancel her session today due to the pain she has been experiencing in the left hip. She plans on continue to follow-up with Dr. Lorin Mercy and resuming physical therapy.  Other medical conditions are listed as follows:  Abnormal SPEP  History of hypothyroidism  Coronary artery disease involving native coronary artery of native heart without angina pectoris  History of gastroesophageal reflux (GERD)  History of depression  Adenomatous polyp of colon, unspecified part of colon  Orders: Orders Placed This Encounter  Procedures  . XR HIP UNILAT W OR W/O PELVIS 2-3 VIEWS LEFT   No orders of the defined types were placed in this encounter.   Face-to-face time spent with patient was 30 minutes. Greater than 50% of time was spent in counseling and coordination of care.  Follow-Up Instructions: Return in about 3 months (around 08/31/2019) for Polymyalgia Rheumatica.   Ofilia Neas, PA-C  Note - This record has been created using Dragon software.  Chart creation errors have been sought, but may not always  have been located. Such creation errors do not reflect on  the standard of medical care.

## 2019-06-01 NOTE — Progress Notes (Deleted)
Office Visit Note  Patient: [name]             Date of Birth: January 01, 1951           MRN: [id number]             PCP: [name], MD Referring: [name], MD Visit Date: 06/08/2019 Occupation: @GUAROCC @  Subjective:  No chief complaint on file.   History of Present Illness: [name] is a 69 y.o. female ***   Activities of Daily Living:  Patient reports morning stiffness for *** {minute/hour:19697}.   Patient {ACTIONS;DENIES/REPORTS:21021675::"Denies"} nocturnal pain.  Difficulty dressing/grooming: {ACTIONS;DENIES/REPORTS:21021675::"Denies"} Difficulty climbing stairs: {ACTIONS;DENIES/REPORTS:21021675::"Denies"} Difficulty getting out of chair: {ACTIONS;DENIES/REPORTS:21021675::"Denies"} Difficulty using hands for taps, buttons, cutlery, and/or writing: {ACTIONS;DENIES/REPORTS:21021675::"Denies"}  No Rheumatology ROS completed.   PMFS History:  Patient Active Problem List   Diagnosis Date Noted  . Lumbar compression fracture ([place]) 05/11/2019  . Educated about COVID-19 virus infection 01/26/2019  . Dyslipidemia 01/26/2019  . Drug-induced myopathy 01/14/2019  . Haglund's deformity 01/12/2019  . Myalgia 12/30/2018  . High risk medication use 12/04/2018  . Recurrent major depressive disorder, in full remission ([place]) 09/03/2018  . Lower esophageal ring ([place]) 03/03/2018  . Osteoarthritis of right hip 12/02/2017  . GERD (gastroesophageal reflux disease) 12/15/2015  . Polyp of colon, adenomatous 10/11/2014  . Vitamin D deficiency 05/26/2014  . Osteoporosis 05/26/2014  . Polymyalgia rheumatica ([place]) 12/01/2010  . CAD (coronary artery disease) 09/09/2007  . Hematuria 09/09/2007  . Depression 03/20/2007  . Hypothyroid 02/13/1999    Past Medical History:  Diagnosis Date  . Anxiety   . Arthritis   . CAD (coronary artery disease)   . Cataract   . Depression   . DISORDER, MENOPAUSAL NOS 03/20/2007   Qualifier: Diagnosis of  By: Arnoldo Morale MD, Balinda Quails   . GERD (gastroesophageal reflux disease)   . Hyperlipidemia   . Hypothyroidism   . MYOCARDIAL INFARCTION, HX OF 09/09/2007   Qualifier: Diagnosis of  By: Leanne Chang MD, Bruce    . Osteoporosis   . Polymyalgia rheumatica (H[facility])     Family History  Problem Relation Age of Onset  . Hypertension Father   . Heart disease Father        Mi age 45, 29, 40 and 67.  . Cancer Father   . Hyperlipidemia Father   . Colon cancer Maternal Uncle   . Hypertension Mother   . Cancer Mother   . Hyperlipidemia Mother   . Pulmonary fibrosis Sister   . Epilepsy Son   . Healthy Son   . Healthy Son   . Rheum arthritis Niece   . Lupus Niece   . Rheum arthritis Niece    Past Surgical History:  Procedure Laterality Date  . CATARACT EXTRACTION    . CESAREAN SECTION    . CHOLECYSTECTOMY N/A 11/24/2012   Procedure: LAPAROSCOPIC CHOLECYSTECTOMY;  Surgeon: Ralene Ok, MD;  Location: [facility];  Service: General;  Laterality: N/A;  . COSMETIC SURGERY    . EYE SURGERY     eye lids lifted  . PTCA     Social History   Social History Narrative   Husband and 2 adult children live at home. No grandkids. Son with epilepsy.       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 No part time.    Immunization History  Administered Date(s) Administered  . Influenza Whole 11/09/2008  . Influenza, High Dose Seasonal PF 10/30/2016, 12/02/2017, 11/08/2018  . Influenza-Unspecified 11/08/2018  .  Pneumococcal Conjugate-13 10/11/2014  . Pneumococcal Polysaccharide-23 12/15/2015  . Td 02/12/2006  . Tdap 04/26/2017  . Zoster 09/19/2010     Objective: Vital Signs: There were no vitals taken for this visit.   Physical Exam   Musculoskeletal Exam: ***  CDAI Exam: CDAI Score: -- Patient Global: --; Provider Global: -- Swollen: --; Tender: -- Joint Exam 06/08/2019   No joint exam has been documented for this visit   There is currently no information documented on the homunculus. Go to the Rheumatology activity and  complete the homunculus joint exam.  Investigation: No additional findings.  Imaging: No results found.  Recent Labs: Lab Results  Component Value Date   WBC 6.2 05/13/2019   HGB 12.6 05/13/2019   PLT 273 05/13/2019   NA 141 05/13/2019   K 4.0 05/13/2019   CL 106 05/13/2019   CO2 30 05/13/2019   GLUCOSE 105 05/13/2019   BUN 12 05/13/2019   CREATININE 0.83 05/13/2019   BILITOT 0.4 05/13/2019   ALKPHOS 102 01/01/2019   AST 16 05/13/2019   ALT 16 05/13/2019   PROT 6.0 (L) 05/13/2019   ALBUMIN 3.7 01/01/2019   CALCIUM 9.3 05/13/2019   GFRAA 83 05/13/2019   QFTBGOLDPLUS NEGATIVE 08/12/2018    Speciality Comments: No specialty comments available.  Procedures:  No procedures performed Allergies: Morphine and related, Prochlorperazine, Simvastatin, and Sulfa antibiotics   Assessment / Plan:     Visit Diagnoses: No diagnosis found.  Orders: No orders of the defined types were placed in this encounter.  No orders of the defined types were placed in this encounter.   Face-to-face time spent with patient was *** minutes. Greater than 50% of time was spent in counseling and coordination of care.  Follow-Up Instructions: No follow-ups on file.   Ofilia Neas, PA-C  Note - This record has been created using Dragon software.  Chart creation errors have been sought, but may not always  have been located. Such creation errors do not reflect on  the standard of medical care.

## 2019-06-08 ENCOUNTER — Encounter: Payer: PPO | Admitting: Rehabilitative and Restorative Service Providers"

## 2019-06-08 ENCOUNTER — Telehealth: Payer: Self-pay | Admitting: Rehabilitative and Restorative Service Providers"

## 2019-06-08 ENCOUNTER — Telehealth: Payer: Self-pay | Admitting: Rheumatology

## 2019-06-08 ENCOUNTER — Ambulatory Visit: Payer: PPO | Admitting: Physician Assistant

## 2019-06-08 DIAGNOSIS — M810 Age-related osteoporosis without current pathological fracture: Secondary | ICD-10-CM | POA: Diagnosis not present

## 2019-06-08 DIAGNOSIS — M85851 Other specified disorders of bone density and structure, right thigh: Secondary | ICD-10-CM | POA: Diagnosis not present

## 2019-06-08 LAB — HM DEXA SCAN

## 2019-06-08 NOTE — Telephone Encounter (Signed)
Patient had questions regarding her dosage of Prednisone.  Patient was transferred to [facility].

## 2019-06-08 NOTE — Telephone Encounter (Signed)
Called Pt. After 10 mins no show for appointment.  Pt. Answered and indicated she forgot about the appointment.  Can't reschedule for this week.  Has appointment on May 4th scheduled already.  Scot Jun, PT, DPT, OCS, ATC 06/08/19  11:13 AM

## 2019-06-08 NOTE — Telephone Encounter (Signed)
Patient states she has been taking prednisone 5mg  po qd and the prescription she just picked up advised to take a total of 7mg  daily. We have not sent in a recent prescription for that dose since 11/2018. I advised patient to continue taking dose as advised in the office. Patient verbalized understanding.

## 2019-06-16 ENCOUNTER — Encounter: Payer: PPO | Admitting: Rehabilitative and Restorative Service Providers"

## 2019-06-19 ENCOUNTER — Telehealth: Payer: Self-pay | Admitting: Rheumatology

## 2019-06-19 ENCOUNTER — Telehealth: Payer: Self-pay | Admitting: Family Medicine

## 2019-06-19 NOTE — Telephone Encounter (Signed)
Attempted to contact the patient and left message to advise patient we have not received the bone density scan results at this time.

## 2019-06-19 NOTE — Telephone Encounter (Signed)
Patient calling to see if the Bone Density Test result has come back yet? Please Advise. jk [phone]

## 2019-06-19 NOTE — Telephone Encounter (Signed)
Patient calling to see if doctor has received Bone Density results yet? Patient would like a call back with results.

## 2019-06-19 NOTE — Telephone Encounter (Signed)
See below, was abstracted in.

## 2019-06-19 NOTE — Telephone Encounter (Signed)
Team-need a copy of this to review.  It looks like she has osteoporosis based on the abstraction so may need treatment.  Request another copy if we do not have one on hand

## 2019-06-22 ENCOUNTER — Telehealth: Payer: Self-pay | Admitting: Rheumatology

## 2019-06-22 ENCOUNTER — Telehealth: Payer: Self-pay

## 2019-06-22 NOTE — Telephone Encounter (Signed)
Advised patient we have not received DEXA results, I called [facility] to request a copy.    DEXA results received and are in the folder for Dr. Estanislado Pandy to review.

## 2019-06-22 NOTE — Telephone Encounter (Signed)
DEXA received via fax from [facility].   Date of scan: 06/08/2019  Lowest site measured: left total hip BMD: 0.610 T-score: -2.7  Dr. Estanislado Pandy reviewed and advised me that DEXA is stable, she will discuss at follow up scheduled on 10/08/2019.   I attempted to contact patient and left message on machine to advise.

## 2019-06-22 NOTE — Telephone Encounter (Signed)
DEXA reviewed by Dr. Estanislado Pandy and she states it is stable and she will discuss with patient at follow up on 10/08/2019. I attempted to contact patient and left message on machine to advise patient.

## 2019-06-22 NOTE — Telephone Encounter (Signed)
Patient calling to see if we have received the Bone Density results? Please call to advise.

## 2019-06-23 ENCOUNTER — Telehealth: Payer: Self-pay

## 2019-06-23 NOTE — Telephone Encounter (Signed)
Done

## 2019-07-07 ENCOUNTER — Other Ambulatory Visit: Payer: Self-pay | Admitting: Rheumatology

## 2019-07-07 ENCOUNTER — Telehealth: Payer: Self-pay | Admitting: Rheumatology

## 2019-07-07 NOTE — Telephone Encounter (Signed)
Last Visit: 06/01/2019 °Next Visit: 10/08/2019 °  °Current Dose per office note on 06/01/2019: Prednisone 5 mg 1 tablet by mouth daily. Unable to taper at this time.  °  °Okay to refill per Dr. Deveshwar  °

## 2019-07-07 NOTE — Telephone Encounter (Signed)
Patient calling to request refill on Prednisone sent to [name].

## 2019-07-07 NOTE — Telephone Encounter (Signed)
Prescription sent to the pharmacy.

## 2019-07-29 ENCOUNTER — Other Ambulatory Visit: Payer: Self-pay | Admitting: Rheumatology

## 2019-07-29 DIAGNOSIS — M353 Polymyalgia rheumatica: Secondary | ICD-10-CM

## 2019-07-29 MED ORDER — METHOTREXATE 2.5 MG PO TABS: ORAL_TABLET | ORAL | 0 refills | Status: DC

## 2019-07-29 NOTE — Telephone Encounter (Signed)
Last Visit: 06/01/2019 Next Visit: 10/08/2019 Labs: 05/13/2019 Total protein borderline low-6.0 but stable. Rest of CMP WNL. CBC WNL.   Current Dose per office note on 06/01/2019: methotrexate 4 tablets by mouth once weekly   DX: PMR  Okay to refill MTX?

## 2019-07-29 NOTE — Telephone Encounter (Signed)
Patient called requesting prescription refill of Methotrexate to be sent to [facility].

## 2019-07-31 DIAGNOSIS — Z1231 Encounter for screening mammogram for malignant neoplasm of breast: Secondary | ICD-10-CM | POA: Diagnosis not present

## 2019-07-31 LAB — HM MAMMOGRAPHY

## 2019-08-03 ENCOUNTER — Encounter: Payer: Self-pay | Admitting: Family Medicine

## 2019-08-03 ENCOUNTER — Other Ambulatory Visit: Payer: Self-pay | Admitting: Rheumatology

## 2019-08-03 NOTE — Telephone Encounter (Signed)
Last Visit: 06/01/2019 °Next Visit: 10/08/2019 °  °Current Dose per office note on 06/01/2019: Prednisone 5 mg 1 tablet by mouth daily. Unable to taper at this time.  °  °Okay to refill per Dr. Deveshwar  °

## 2019-08-28 ENCOUNTER — Ambulatory Visit (INDEPENDENT_AMBULATORY_CARE_PROVIDER_SITE_OTHER): Payer: PPO

## 2019-08-28 ENCOUNTER — Encounter: Payer: Self-pay | Admitting: Orthopaedic Surgery

## 2019-08-28 ENCOUNTER — Ambulatory Visit: Payer: PPO | Admitting: Orthopaedic Surgery

## 2019-08-28 VITALS — BP 141/90 | HR 72

## 2019-08-28 DIAGNOSIS — M9261 Juvenile osteochondrosis of tarsus, right ankle: Secondary | ICD-10-CM

## 2019-08-28 NOTE — Progress Notes (Signed)
Office Visit Note   Patient: [name]           Date of Birth: 09/01/1950           MRN: [id number] Visit Date: 08/28/2019              Requested by: [name], MD [place],  [place] [address] PCP: [name], MD   Assessment & Plan: Visit Diagnoses:  1. Haglund's deformity of right heel     Plan: Patient can put some foam in the bottom of her cam boot as a heel lift and disconnect the lining from the cam boot place it around her foot and then reapply the cam boot hard plastic portion over the outside.  We discussed immobilization and rest.  We discussed operative intervention with removal of the deformity and repair of Achilles tendon.  If she decided to consider that she understands it be 3 months after the surgery before she be walking around with a cane.  She decides she wants to consider surgery then MRI scan would be needed to see how far proximally at the tendon would have to go up with the we will make sure we get areas of normal tendon.  We will check her back again in 3 weeks.  Follow-Up Instructions: Return in about 3 weeks (around 09/18/2019).   Orders:  Orders Placed This Encounter  Procedures  . XR Foot Complete Right   No orders of the defined types were placed in this encounter.     Procedures: No procedures performed   Clinical Data: No additional findings.   Subjective: Chief Complaint  Patient presents with  . 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  She states she was on her feet a lot Wednesday and yesterday put her boot on due to severe pain and today she cannot walk cannot wear the boot and is using crutches.  No erythema no history of recent trauma.  She has had some progression on x-rays of the distal Achilles calcific tendinopathy.  She does have a cam boot at home but when she tried to put it on with her ankle at 90 degrees she  states the pain was unbearable.  Review of Systems positive rheumatoid arthritis long-term high-risk higher dose prednisone originally 30 mg a day now down to 5 mg daily.  Pause for osteoporosis on chronic Fosamax 70 mg weekly.  Otherwise noncontributory to HPI.   Objective: Vital Signs: BP (!) 141/90 (BP Location: Left Arm, Patient Position: Sitting, Cuff Size: Normal)   Pulse 72   Physical Exam Constitutional:      Appearance: She is well-developed.  HENT:     Head: Normocephalic.     Right Ear: External ear normal.     Left Ear: External ear normal.  Eyes:     Pupils: Pupils are equal, round, and reactive to light.  Neck:     Thyroid: No thyromegaly.     Trachea: No tracheal deviation.  Cardiovascular:     Rate and Rhythm: Normal rate.  Pulmonary:     Effort: Pulmonary effort is normal.  Abdominal:     Palpations: Abdomen is soft.  Skin:    General: Skin is warm and dry.  Neurological:     Mental Status: She is alert and oriented to person, place, and time.  Psychiatric:  Behavior: Behavior normal.     Ortho Exam patient has extreme tenderness over Haglund's deformity as well as the distal 3 cm of Achilles tendon proximal to the deformity.  She cries with attempts to flex her to 5.  Normal pulses.  Skin is intact.  Specialty Comments:  No specialty comments available.  Imaging: No results found.   PMFS History: Patient Active Problem List   Diagnosis Date Noted  . Lumbar compression fracture ([place]) 05/11/2019  . Educated about COVID-19 virus infection 01/26/2019  . Dyslipidemia 01/26/2019  . Drug-induced myopathy 01/14/2019  . Haglund's deformity 01/12/2019  . Myalgia 12/30/2018  . High risk medication use 12/04/2018  . Recurrent major depressive disorder, in full remission ([place]) 09/03/2018  . Lower esophageal ring ([place]) 03/03/2018  . Osteoarthritis of right hip 12/02/2017  . GERD (gastroesophageal reflux disease) 12/15/2015  . Polyp of colon,  adenomatous 10/11/2014  . Vitamin D deficiency 05/26/2014  . Osteoporosis 05/26/2014  . Polymyalgia rheumatica ([place]) 12/01/2010  . CAD (coronary artery disease) 09/09/2007  . Hematuria 09/09/2007  . Depression 03/20/2007  . Hypothyroid 02/13/1999   Past Medical History:  Diagnosis Date  . Anxiety   . Arthritis   . CAD (coronary artery disease)   . Cataract   . Depression   . DISORDER, MENOPAUSAL NOS 03/20/2007   Qualifier: Diagnosis of  By: Arnoldo Morale MD, Balinda Quails   . GERD (gastroesophageal reflux disease)   . Hyperlipidemia   . Hypothyroidism   . MYOCARDIAL INFARCTION, HX OF 09/09/2007   Qualifier: Diagnosis of  By: Leanne Chang MD, Bruce    . Osteoporosis   . Polymyalgia rheumatica ([facility])     Family History  Problem Relation Age of Onset  . Hypertension Father   . Heart disease Father        Mi age 89, 67, 39 and 69.  . Cancer Father   . Hyperlipidemia Father   . Colon cancer Maternal Uncle   . Hypertension Mother   . Cancer Mother   . Hyperlipidemia Mother   . Pulmonary fibrosis Sister   . Epilepsy Son   . Healthy Son   . Healthy Son   . Rheum arthritis Niece   . Lupus Niece   . Rheum arthritis Niece     Past Surgical History:  Procedure Laterality Date  . CATARACT EXTRACTION    . CESAREAN SECTION    . CHOLECYSTECTOMY N/A 11/24/2012   Procedure: LAPAROSCOPIC CHOLECYSTECTOMY;  Surgeon: Ralene Ok, MD;  Location: [facility];  Service: General;  Laterality: N/A;  . COSMETIC SURGERY    . EYE SURGERY     eye lids lifted  . PTCA     Social History   Occupational History  . Not on file  Tobacco Use  . Smoking status: Never Smoker  . Smokeless tobacco: Never Used  Vaping Use  . Vaping Use: Never used  Substance and Sexual Activity  . Alcohol use: No    Alcohol/week: 0.0 standard drinks  . Drug use: No  . Sexual activity: Not on file

## 2019-08-29 ENCOUNTER — Encounter: Payer: Self-pay | Admitting: Orthopaedic Surgery

## 2019-08-31 ENCOUNTER — Telehealth: Payer: Self-pay | Admitting: Orthopaedic Surgery

## 2019-08-31 NOTE — Telephone Encounter (Signed)
Patient called.   She is requesting a note that allows her to work with her boot on. She wants it faxed to the number listed.   Fax: [phone]

## 2019-08-31 NOTE — Telephone Encounter (Signed)
Could you please fax note for me? I will let patient know. Thanks.

## 2019-08-31 NOTE — Telephone Encounter (Signed)
Faxed to [phone].

## 2019-09-05 ENCOUNTER — Other Ambulatory Visit: Payer: Self-pay | Admitting: Rheumatology

## 2019-09-07 NOTE — Telephone Encounter (Signed)
Last Visit: 06/01/2019 °Next Visit: 10/08/2019 °  °Current Dose per office note on 06/01/2019: Prednisone 5 mg 1 tablet by mouth daily. Unable to taper at this time.  °  °Okay to refill per Dr. Deveshwar  °

## 2019-09-28 NOTE — Progress Notes (Signed)
Office Visit Note  Patient: [name]             Date of Birth: 02-Feb-1951           MRN: [id number]             PCP: [name], MD Referring: [name], MD Visit Date: 10/08/2019 Occupation: @GUAROCC @  Subjective:  Other (right hip and low back pain )   History of Present Illness: [name] is a 69 y.o. female with history of polymyalgia, osteoarthritis, degenerative disc disease and osteoporosis.  She states for the last 24 hours her lower back has been having some discomfort.  She also has discomfort ongoing in her right trochanteric bursa.  She has difficulty sleeping on her right side.  She came today to discuss her bone density results.  She has been on Fosamax for more than 5 years for osteoporosis.  She also has history of vertebral fracture.  She has polymyalgia rheumatica she is a still on prednisone 5 mg p.o. daily and she is unable to taper it.  She is taking methotrexate 4 tablets p.o.weekly.  Activities of Daily Living:  Patient reports morning stiffness for 30 minutes.   Patient Reports nocturnal pain.  Difficulty dressing/grooming: Denies Difficulty climbing stairs: Denies Difficulty getting out of chair: Denies Difficulty using hands for taps, buttons, cutlery, and/or writing: Denies  Review of Systems  Constitutional: Positive for fatigue.  HENT: Negative for mouth sores, mouth dryness and nose dryness.   Eyes: Negative for itching and dryness.  Respiratory: Negative for shortness of breath and difficulty breathing.   Cardiovascular: Negative for chest pain and palpitations.  Gastrointestinal: Negative for blood in stool, constipation and diarrhea.  Endocrine: Negative for increased urination.  Genitourinary: Negative for difficulty urinating.  Musculoskeletal: Positive for arthralgias, joint pain, myalgias, morning stiffness, muscle tenderness and myalgias. Negative for joint swelling.  Skin: Negative for color change, rash and  redness.  Allergic/Immunologic: Negative for susceptible to infections.  Neurological: Positive for weakness. Negative for dizziness, numbness, headaches and memory loss.  Hematological: Negative for bruising/bleeding tendency.  Psychiatric/Behavioral: Positive for sleep disturbance. Negative for confusion.    PMFS History:  Patient Active Problem List   Diagnosis Date Noted   Lumbar compression fracture ([place]) 05/11/2019   Educated about COVID-19 virus infection 01/26/2019   Dyslipidemia 01/26/2019   Drug-induced myopathy 01/14/2019   Haglund's deformity 01/12/2019   Myalgia 12/30/2018   High risk medication use 12/04/2018   Recurrent major depressive disorder, in full remission ([place]) 09/03/2018   Lower esophageal ring ([place]) 03/03/2018   Osteoarthritis of right hip 12/02/2017   GERD (gastroesophageal reflux disease) 12/15/2015   Polyp of colon, adenomatous 10/11/2014   Vitamin D deficiency 05/26/2014   Osteoporosis 05/26/2014   Polymyalgia rheumatica ([place]) 12/01/2010   CAD (coronary artery disease) 09/09/2007   Hematuria 09/09/2007   Depression 03/20/2007   Hypothyroid 02/13/1999    Past Medical History:  Diagnosis Date   Anxiety    Arthritis    CAD (coronary artery disease)    Cataract    Depression    DISORDER, MENOPAUSAL NOS 03/20/2007   Qualifier: Diagnosis of  By: Arnoldo Morale MD, Balinda Quails    GERD (gastroesophageal reflux disease)    Hyperlipidemia    Hypothyroidism    MYOCARDIAL INFARCTION, HX OF 09/09/2007   Qualifier: Diagnosis of  By: Leanne Chang MD, Bruce     Osteoporosis    Polymyalgia rheumatica [facility])     Family History  Problem Relation Age of Onset   Hypertension Father    Heart disease Father        Mi age 40, 53, 41 and 68.   Cancer Father    Hyperlipidemia Father    Colon cancer Maternal Uncle    Hypertension Mother    Cancer Mother    Hyperlipidemia Mother    Pulmonary fibrosis Sister    Epilepsy Son     Healthy Son    Healthy Son    Rheum arthritis Niece    Lupus Niece    Rheum arthritis Niece    Past Surgical History:  Procedure Laterality Date   CATARACT EXTRACTION     CESAREAN SECTION     CHOLECYSTECTOMY N/A 11/24/2012   Procedure: LAPAROSCOPIC CHOLECYSTECTOMY;  Surgeon: Ralene Ok, MD;  Location: [facility];  Service: General;  Laterality: N/A;   COSMETIC SURGERY     EYE SURGERY     eye lids lifted   PTCA     Social History   Social History Narrative   Husband and 2 adult children live at home. No grandkids. Son with epilepsy.       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    Immunization History  Administered Date(s) Administered   Influenza Whole 11/09/2008   Influenza, High Dose Seasonal PF 10/30/2016, 12/02/2017, 11/08/2018   Influenza-Unspecified 11/08/2018   PFIZER SARS-COV-2 Vaccination 02/26/2019, 03/20/2019   Pneumococcal Conjugate-13 10/11/2014   Pneumococcal Polysaccharide-23 12/15/2015   Td 02/12/2006   Tdap 04/26/2017   Zoster 09/19/2010     Objective: Vital Signs: BP (!) 152/88 (BP Location: Right Arm, Patient Position: Sitting, Cuff Size: Normal)    Pulse 66    Resp 14    Ht 5' 1.25" (1.556 m)    Wt 161 lb (73 kg)    BMI 30.17 kg/m    Physical Exam Vitals and nursing note reviewed.  Constitutional:      Appearance: She is well-developed.  HENT:     Head: Normocephalic and atraumatic.  Eyes:     Conjunctiva/sclera: Conjunctivae normal.  Cardiovascular:     Rate and Rhythm: Normal rate and regular rhythm.     Heart sounds: Normal heart sounds.  Pulmonary:     Effort: Pulmonary effort is normal.     Breath sounds: Normal breath sounds.  Abdominal:     General: Bowel sounds are normal.     Palpations: Abdomen is soft.  Musculoskeletal:     Cervical back: Normal range of motion.  Lymphadenopathy:     Cervical: No cervical adenopathy.  Skin:    General: Skin is warm and dry.     Capillary Refill: Capillary  refill takes less than 2 seconds.  Neurological:     Mental Status: She is alert and oriented to person, place, and time.  Psychiatric:        Behavior: Behavior normal.      Musculoskeletal Exam: C-spine was in good range of motion.  She has thoracic kyphosis.  She has discomfort with range of motion of lumbar spine with paravertebral tenderness.  Shoulder joints, elbow joints, wrist joints with good range of motion.  She has bilateral PIP and DIP thickening.  Hip joints with good range of motion.  She has some limitation of range of motion of her right hip joint and tenderness over right trochanteric bursa.  Knee joints, ankles, MTPs and PIPs with good range of motion with no synovitis.  CDAI Exam: CDAI Score: -- Patient Global: --;  Provider Global: -- Swollen: --; Tender: -- Joint Exam 10/08/2019   No joint exam has been documented for this visit   There is currently no information documented on the homunculus. Go to the Rheumatology activity and complete the homunculus joint exam.  Investigation: No additional findings.  Imaging: No results found.  Recent Labs: Lab Results  Component Value Date   WBC 6.2 05/13/2019   HGB 12.6 05/13/2019   PLT 273 05/13/2019   NA 141 05/13/2019   K 4.0 05/13/2019   CL 106 05/13/2019   CO2 30 05/13/2019   GLUCOSE 105 05/13/2019   BUN 12 05/13/2019   CREATININE 0.83 05/13/2019   BILITOT 0.4 05/13/2019   ALKPHOS 102 01/01/2019   AST 16 05/13/2019   ALT 16 05/13/2019   PROT 6.0 (L) 05/13/2019   ALBUMIN 3.7 01/01/2019   CALCIUM 9.3 05/13/2019   GFRAA 83 05/13/2019   QFTBGOLDPLUS NEGATIVE 08/12/2018    Speciality Comments: No specialty comments available.  Procedures:  Large Joint Inj: R greater trochanter on 10/08/2019 10:05 AM Indications: pain Details: 27 G 1.5 in needle, lateral approach  Arthrogram: No  Medications: 40 mg triamcinolone acetonide 40 MG/ML; 1.5 mL lidocaine 1 % Aspirate: 0 mL Outcome: tolerated well, no  immediate complications Procedure, treatment alternatives, risks and benefits explained, specific risks discussed. Consent was given by the patient. Immediately prior to procedure a time out was called to verify the correct patient, procedure, equipment, support staff and site/side marked as required. Patient was prepped and draped in the usual sterile fashion.     Allergies: Morphine and related, Prochlorperazine, Simvastatin, and Sulfa antibiotics   Assessment / Plan:     Visit Diagnoses: Polymyalgia rheumatica ([place]) - Diagnosed 7 years ago by her previous rheumatologist.  She is unable to taper prednisone below 5 mg.  She states she is doing quite well on the current dose.  She has no muscular weakness or tenderness on examination.  Long term (current) use of systemic steroids - Prednisone 5 mg 1 tablet by mouth daily. Unable to taper at this time.  Side effects of long-term use of steroids were again emphasized.  High risk medication use - Methotrexate 4 tablets by mouth once weekly and folic acid 1 mg by mouth daily. Prednisone 5 mg 1 tablet daily -she has not had labs in a long time.  We will get labs today.  Getting labs every 3 months was emphasized.  Plan: CBC with Differential/Platelet, COMPLETE METABOLIC PANEL WITH GFR  Age-related osteoporosis without current pathological fracture -DXA June 08, 2019 T score -2.7 in the lumbar region with no significant change on comparison to the previous films.She has a history of L1 compression fracture.  We detailed discussion regarding different treatment options.  She has been on Fosamax for many years.  With a history of L1 compression fracture she would be a good candidate for Forteo.  There is no history of radiation therapy.  There is no history of bone cancer in the family.  Indication side effects contraindications were discussed at length.  We will apply for Forteo.  I will obtain following labs today.  Once approved we will switch her to  [facility].- Plan: VITAMIN D 25 Hydroxy (Vit-D Deficiency, Fractures), Parathyroid hormone, intact (no Ca), TSH, Phosphorus  Vitamin D deficiency -she has been taking over-the-counter vitamin D.  Plan: VITAMIN D 25 Hydroxy (Vit-D Deficiency, Fractures)  Trochanteric bursitis, right hip-she has been having severe right trochanteric bursa pain.  She complains of nocturnal pain  and difficulty walking.  Per her request her right trochanteric bursa was injected with cortisone as described above.  She tolerated the procedure well.  Primary osteoarthritis of both hands-joint protection muscle strengthening was discussed.  Primary osteoarthritis of both feet  Primary osteoarthritis of right hip  DDD (degenerative disc disease), lumbar - Dr. Lorin Mercy. MRI of the lumbar spine was obtained on 04/23/2019.  She has some para vertebral muscle tenderness.  She states she has been experiencing lower back pain.  I have given her handout on back exercises.  She has no radiculopathy.  Adenomatous polyp of colon, unspecified part of colon  History of gastroesophageal reflux (GERD)  History of depression  Coronary artery disease involving native coronary artery of native heart without angina pectoris  History of hypothyroidism  Orders: Orders Placed This Encounter  Procedures   Large Joint Inj   CBC with Differential/Platelet   COMPLETE METABOLIC PANEL WITH GFR   VITAMIN D 25 Hydroxy (Vit-D Deficiency, Fractures)   Parathyroid hormone, intact (no Ca)   TSH   Phosphorus   No orders of the defined types were placed in this encounter.     Follow-Up Instructions: Return in about 3 months (around 01/08/2020) for Polymyalgia rheumatica, Osteoporosis, Osteoarthritis.   Bo Merino, MD  Note - This record has been created using Editor, commissioning.  Chart creation errors have been sought, but may not always  have been located. Such creation errors do not reflect on  the standard of medical care.

## 2019-10-06 ENCOUNTER — Other Ambulatory Visit: Payer: Self-pay | Admitting: Rheumatology

## 2019-10-06 NOTE — Telephone Encounter (Signed)
Last Visit: 06/01/2019 Next Visit: 10/08/2019  Current Dose per office note on 06/01/2019:Prednisone 5 mg 1 tablet by mouth daily.Unable to taper at this time.  Okay to refill per Dr. Estanislado Pandy

## 2019-10-08 ENCOUNTER — Encounter: Payer: Self-pay | Admitting: Rheumatology

## 2019-10-08 ENCOUNTER — Telehealth: Payer: Self-pay

## 2019-10-08 ENCOUNTER — Ambulatory Visit: Payer: PPO | Admitting: Rheumatology

## 2019-10-08 ENCOUNTER — Other Ambulatory Visit: Payer: Self-pay

## 2019-10-08 VITALS — BP 152/88 | HR 66 | Resp 14 | Ht 61.25 in | Wt 161.0 lb

## 2019-10-08 DIAGNOSIS — Z8719 Personal history of other diseases of the digestive system: Secondary | ICD-10-CM | POA: Diagnosis not present

## 2019-10-08 DIAGNOSIS — M810 Age-related osteoporosis without current pathological fracture: Secondary | ICD-10-CM

## 2019-10-08 DIAGNOSIS — Z79899 Other long term (current) drug therapy: Secondary | ICD-10-CM | POA: Diagnosis not present

## 2019-10-08 DIAGNOSIS — M5136 Other intervertebral disc degeneration, lumbar region: Secondary | ICD-10-CM

## 2019-10-08 DIAGNOSIS — M19072 Primary osteoarthritis, left ankle and foot: Secondary | ICD-10-CM

## 2019-10-08 DIAGNOSIS — D126 Benign neoplasm of colon, unspecified: Secondary | ICD-10-CM

## 2019-10-08 DIAGNOSIS — Z7952 Long term (current) use of systemic steroids: Secondary | ICD-10-CM

## 2019-10-08 DIAGNOSIS — Z8639 Personal history of other endocrine, nutritional and metabolic disease: Secondary | ICD-10-CM

## 2019-10-08 DIAGNOSIS — M353 Polymyalgia rheumatica: Secondary | ICD-10-CM

## 2019-10-08 DIAGNOSIS — M1611 Unilateral primary osteoarthritis, right hip: Secondary | ICD-10-CM | POA: Diagnosis not present

## 2019-10-08 DIAGNOSIS — M7061 Trochanteric bursitis, right hip: Secondary | ICD-10-CM

## 2019-10-08 DIAGNOSIS — R778 Other specified abnormalities of plasma proteins: Secondary | ICD-10-CM

## 2019-10-08 DIAGNOSIS — I2510 Atherosclerotic heart disease of native coronary artery without angina pectoris: Secondary | ICD-10-CM

## 2019-10-08 DIAGNOSIS — E559 Vitamin D deficiency, unspecified: Secondary | ICD-10-CM

## 2019-10-08 DIAGNOSIS — M51369 Other intervertebral disc degeneration, lumbar region without mention of lumbar back pain or lower extremity pain: Secondary | ICD-10-CM

## 2019-10-08 DIAGNOSIS — M19042 Primary osteoarthritis, left hand: Secondary | ICD-10-CM

## 2019-10-08 DIAGNOSIS — M7062 Trochanteric bursitis, left hip: Secondary | ICD-10-CM

## 2019-10-08 DIAGNOSIS — M19041 Primary osteoarthritis, right hand: Secondary | ICD-10-CM

## 2019-10-08 DIAGNOSIS — Z8659 Personal history of other mental and behavioral disorders: Secondary | ICD-10-CM

## 2019-10-08 DIAGNOSIS — M19071 Primary osteoarthritis, right ankle and foot: Secondary | ICD-10-CM | POA: Diagnosis not present

## 2019-10-08 MED ORDER — LIDOCAINE HCL 1 % IJ SOLN
1.5000 mL | INTRAMUSCULAR | Status: AC | PRN
  Administered 2019-10-08: 1.5 mL

## 2019-10-08 MED ORDER — TRIAMCINOLONE ACETONIDE 40 MG/ML IJ SUSP
40.0000 mg | INTRAMUSCULAR | Status: AC | PRN
  Administered 2019-10-08: 40 mg via INTRA_ARTICULAR

## 2019-10-08 NOTE — Patient Instructions (Addendum)
COVID-19 vaccine recommendations:   COVID-19 vaccine is recommended for everyone (unless you are allergic to a vaccine component), even if you are on a medication that suppresses your immune system.   If you are on Methotrexate, Cellcept (mycophenolate), Rinvoq, Morrie Sheldon, and Olumiant- hold the medication for 1 week after each vaccine. Hold Methotrexate for 2 weeks after the single dose COVID-19 vaccine.   If you are on Orencia subcutaneous injection - hold medication one week prior to and one week after the first COVID-19 vaccine dose (only).   If you are on Orencia IV infusions- time vaccination administration so that the first COVID-19 vaccination will occur four weeks after the infusion and postpone the subsequent infusion by one week.   If you are on Cyclophosphamide or Rituxan infusions please contact your doctor prior to receiving the COVID-19 vaccine.   Do not take Tylenol or any anti-inflammatory medications (NSAIDs) 24 hours prior to the COVID-19 vaccination.   There is no direct evidence about the efficacy of the COVID-19 vaccine in individuals who are on medications that suppress the immune system.   Even if you are fully vaccinated, and you are on any medications that suppress your immune system, please continue to wear a mask, maintain at least six feet social distance and practice hand hygiene.   If you develop a COVID-19 infection, please contact your PCP or our office to determine if you need antibody infusion.  The booster vaccine is now available for immunocompromised patients. It is advised that if you had Pfizer vaccine you should get Coca-Cola booster.  If you had a Moderna vaccine then you should get a Moderna booster. Johnson and Wynetta Emery does not have a booster vaccine at this time.  Please see the following web sites for updated information.    https://[email or web address]/[email or web address]/Files/COVID-19-Vaccination-Patient-Resources.pdf  [email or web address]://[email or web address]/About-Us/[email or web address]-Releases/ID/1159  Standing Labs We placed an order today for your standing lab work.   Please have your standing labs drawn in November and every 3 months  If possible, please have your labs drawn 2 weeks prior to your appointment so that the provider can discuss your results at your appointment.  We have open lab daily Monday through Thursday from 8:30-12:30 PM and 1:30-4:30 PM and Friday from 8:30-12:30 PM and 1:30-4:00 PM at the office of Dr. Bo Merino, [facility].   Please be advised, patients with office appointments requiring lab work will take precedents over walk-in lab work.  If possible, please come for your lab work on Monday and Friday afternoons, as you may experience shorter wait times. The office is located at [address], [place], [place], [place] [address] No appointment is necessary.   Labs are drawn by [facility]. Please bring your co-pay at the time of your lab draw.  You may receive a bill from [facility] for your lab work.  If you wish to have your labs drawn at another location, please call the office 24 hours in advance to send orders.  If you have any questions regarding directions or hours of operation,  please call [phone].   As a reminder, please drink plenty of water prior to coming for your lab work. Thanks!  Back Exercises The following exercises strengthen the muscles that help to support the trunk and back. They also help to keep the lower back flexible. Doing these exercises can help to prevent back pain or lessen existing pain.  If you have back pain or discomfort, try doing these exercises 2-3 times each day or as told by your health  care provider.  As your pain improves, do them once each day, but increase the number of times that you repeat the steps for each exercise (do more  repetitions).  To prevent the recurrence of back pain, continue to do these exercises once each day or as told by your health care provider. Do exercises exactly as told by your health care provider and adjust them as directed. It is normal to feel mild stretching, pulling, tightness, or discomfort as you do these exercises, but you should stop right away if you feel sudden pain or your pain gets worse. Exercises Single knee to chest Repeat these steps 3-5 times for each leg: 1. Lie on your back on a firm bed or the floor with your legs extended. 2. Bring one knee to your chest. Your other leg should stay extended and in contact with the floor. 3. Hold your knee in place by grabbing your knee or thigh with both hands and hold. 4. Pull on your knee until you feel a gentle stretch in your lower back or buttocks. 5. Hold the stretch for 10-30 seconds. 6. Slowly release and straighten your leg. Pelvic tilt Repeat these steps 5-10 times: 1. Lie on your back on a firm bed or the floor with your legs extended. 2. Bend your knees so they are pointing toward the ceiling and your feet are flat on the floor. 3. Tighten your lower abdominal muscles to press your lower back against the floor. This motion will tilt your pelvis so your tailbone points up toward the ceiling instead of pointing to your feet or the floor. 4. With gentle tension and even breathing, hold this position for 5-10 seconds. Cat-cow Repeat these steps until your lower back becomes more flexible: 1. Get into a hands-and-knees position on a firm surface. Keep your hands under your shoulders, and keep your knees under your hips. You may place padding under your knees for comfort. 2. Let your head hang down toward your chest. Contract your abdominal muscles and point your tailbone toward the floor so your lower back becomes rounded like the back of a cat. 3. Hold this position for 5 seconds. 4. Slowly lift your head, let your abdominal  muscles relax and point your tailbone up toward the ceiling so your back forms a sagging arch like the back of a cow. 5. Hold this position for 5 seconds.  Press-ups Repeat these steps 5-10 times: 1. Lie on your abdomen (face-down) on the floor. 2. Place your palms near your head, about shoulder-width apart. 3. Keeping your back as relaxed as possible and keeping your hips on the floor, slowly straighten your arms to raise the top half of your body and lift your shoulders. Do not use your back muscles to raise your upper torso. You may adjust the placement of your hands to make yourself more comfortable. 4. Hold this position for 5 seconds while you keep your back relaxed. 5. Slowly return to lying flat on the floor.  Bridges Repeat these steps 10 times: 1. Lie on your back on a firm surface. 2. Bend your knees so they are pointing toward the ceiling and your feet are flat on the floor. Your arms should be flat at your sides, next to your body. 3. Tighten your buttocks muscles and lift your buttocks off the floor until your waist is at almost the same height as your knees. You should feel the muscles working in your buttocks and the back of your thighs. If  you do not feel these muscles, slide your feet 1-2 inches farther away from your buttocks. 4. Hold this position for 3-5 seconds. 5. Slowly lower your hips to the starting position, and allow your buttocks muscles to relax completely. If this exercise is too easy, try doing it with your arms crossed over your chest. Abdominal crunches Repeat these steps 5-10 times: 1. Lie on your back on a firm bed or the floor with your legs extended. 2. Bend your knees so they are pointing toward the ceiling and your feet are flat on the floor. 3. Cross your arms over your chest. 4. Tip your chin slightly toward your chest without bending your neck. 5. Tighten your abdominal muscles and slowly raise your trunk (torso) high enough to lift your shoulder  blades a tiny bit off the floor. Avoid raising your torso higher than that because it can put too much stress on your low back and does not help to strengthen your abdominal muscles. 6. Slowly return to your starting position. Back lifts Repeat these steps 5-10 times: 1. Lie on your abdomen (face-down) with your arms at your sides, and rest your forehead on the floor. 2. Tighten the muscles in your legs and your buttocks. 3. Slowly lift your chest off the floor while you keep your hips pressed to the floor. Keep the back of your head in line with the curve in your back. Your eyes should be looking at the floor. 4. Hold this position for 3-5 seconds. 5. Slowly return to your starting position. Contact a health care provider if:  Your back pain or discomfort gets much worse when you do an exercise.  Your worsening back pain or discomfort does not lessen within 2 hours after you exercise. If you have any of these problems, stop doing these exercises right away. Do not do them again unless your health care provider says that you can. Get help right away if:  You develop sudden, severe back pain. If this happens, stop doing the exercises right away. Do not do them again unless your health care provider says that you can. This information is not intended to replace advice given to you by your health care provider. Make sure you discuss any questions you have with your health care provider. Document Revised: 06/05/2018 Document Reviewed: 10/31/2017 Elsevier Patient Education  [place].

## 2019-10-08 NOTE — Telephone Encounter (Signed)
Please apply for forteo, per Dr. Estanislado Pandy. Thanks!

## 2019-10-08 NOTE — Telephone Encounter (Signed)
Submitted a Prior Authorization request to [employer] for [place] via Cover My Meds. Will update once we receive a response.   (Key: [id number]) - [id number]

## 2019-10-09 ENCOUNTER — Other Ambulatory Visit: Payer: Self-pay | Admitting: *Deleted

## 2019-10-09 DIAGNOSIS — E559 Vitamin D deficiency, unspecified: Secondary | ICD-10-CM

## 2019-10-09 LAB — CBC WITH DIFFERENTIAL/PLATELET
Absolute Monocytes: 710 cells/uL (ref 200–950)
Basophils Absolute: 73 cells/uL (ref 0–200)
Basophils Relative: 0.8 %
Eosinophils Absolute: 182 cells/uL (ref 15–500)
Eosinophils Relative: 2 %
HCT: 39.7 % (ref 35.0–45.0)
Hemoglobin: 12.9 g/dL (ref 11.7–15.5)
Lymphs Abs: 1247 cells/uL (ref 850–3900)
MCH: 30.3 pg (ref 27.0–33.0)
MCHC: 32.5 g/dL (ref 32.0–36.0)
MCV: 93.2 fL (ref 80.0–100.0)
MPV: 10.4 fL (ref 7.5–12.5)
Monocytes Relative: 7.8 %
Neutro Abs: 6889 cells/uL (ref 1500–7800)
Neutrophils Relative %: 75.7 %
Platelets: 287 10*3/uL (ref 140–400)
RBC: 4.26 10*6/uL (ref 3.80–5.10)
RDW: 13.5 % (ref 11.0–15.0)
Total Lymphocyte: 13.7 %
WBC: 9.1 10*3/uL (ref 3.8–10.8)

## 2019-10-09 LAB — COMPLETE METABOLIC PANEL WITH GFR
AG Ratio: 1.8 (calc) (ref 1.0–2.5)
ALT: 25 U/L (ref 6–29)
AST: 22 U/L (ref 10–35)
Albumin: 3.7 g/dL (ref 3.6–5.1)
Alkaline phosphatase (APISO): 103 U/L (ref 37–153)
BUN: 14 mg/dL (ref 7–25)
CO2: 30 mmol/L (ref 20–32)
Calcium: 9.2 mg/dL (ref 8.6–10.4)
Chloride: 105 mmol/L (ref 98–110)
Creat: 0.8 mg/dL (ref 0.60–0.93)
GFR, Est African American: 87 mL/min/{1.73_m2} (ref >=60)
GFR, Est Non African American: 75 mL/min/{1.73_m2} (ref >=60)
Globulin: 2.1 g/dL (calc) (ref 1.9–3.7)
Glucose, Bld: 77 mg/dL (ref 65–99)
Potassium: 4.2 mmol/L (ref 3.5–5.3)
Sodium: 143 mmol/L (ref 135–146)
Total Bilirubin: 0.4 mg/dL (ref 0.2–1.2)
Total Protein: 5.8 g/dL — ABNORMAL LOW (ref 6.1–8.1)

## 2019-10-09 LAB — TSH: TSH: 0.51 mIU/L (ref 0.40–4.50)

## 2019-10-09 LAB — VITAMIN D 25 HYDROXY (VIT D DEFICIENCY, FRACTURES): Vit D, 25-Hydroxy: 25 ng/mL — ABNORMAL LOW (ref 30–100)

## 2019-10-09 LAB — PARATHYROID HORMONE, INTACT (NO CA): PTH: 58 pg/mL (ref 14–64)

## 2019-10-09 LAB — PHOSPHORUS: Phosphorus: 3.4 mg/dL (ref 2.1–4.3)

## 2019-10-09 NOTE — Progress Notes (Signed)
Vitamin D is low.  Please advise patient to take vitamin D 2000 units daily.  Repeat vitamin D in 6 months.

## 2019-10-12 NOTE — Telephone Encounter (Signed)
Received notification from [facility] regarding a prior authorization for [name]. Authorization has been APPROVED from 10/10/19 to 02/12/2020.    Phone # [phone] Fax # [phone]   Mariella Saa, PharmD, BCACP, CPP Clinical Specialty Pharmacist (Rheumatology and Pulmonology)  10/12/2019 8:24 AM

## 2019-10-13 ENCOUNTER — Encounter: Payer: Self-pay | Admitting: Family Medicine

## 2019-10-13 NOTE — Telephone Encounter (Signed)
Ran test claim, patient's copay for 1 month supply is $922.63. Mailed patient a Scientist, forensic.

## 2019-10-20 ENCOUNTER — Encounter: Payer: Self-pay | Admitting: Family Medicine

## 2019-10-23 ENCOUNTER — Other Ambulatory Visit: Payer: Self-pay | Admitting: Rheumatology

## 2019-10-23 DIAGNOSIS — M353 Polymyalgia rheumatica: Secondary | ICD-10-CM

## 2019-10-26 NOTE — Telephone Encounter (Signed)
Last Visit: 10/08/2019 Next Visit: 01/14/2020 Labs: 10/08/2019 CBC WNL. Total protein is mildly low but stable. Rest of CMP WNL.   Current Dose per office note 10/08/2019: Methotrexate 4 tablets by mouth once weekly  DX: Polymyalgia rheumatica   Okay to refill MTX?

## 2019-10-27 ENCOUNTER — Encounter: Payer: Self-pay | Admitting: Rheumatology

## 2019-10-27 ENCOUNTER — Telehealth: Payer: Self-pay | Admitting: Rheumatology

## 2019-10-27 NOTE — Telephone Encounter (Signed)
Patient calling to find out when she is due for next lab draw. Please call to advise.

## 2019-10-27 NOTE — Telephone Encounter (Signed)
Patient dropped off application along with income documents.  Pending provider signature.   Mariella Saa, PharmD, [place], CPP Clinical Specialty Pharmacist (Rheumatology and Pulmonology)  10/27/2019 11:37 AM

## 2019-10-27 NOTE — Telephone Encounter (Signed)
Patient advised she is due for labs at the end of November 2021.

## 2019-10-28 NOTE — Telephone Encounter (Signed)
Received signed provider portion.  Application faxed.  Will update when we receive a response.  Fax # [phone]   Mariella Saa, PharmD, BCACP, CPP Clinical Specialty Pharmacist (Rheumatology and Pulmonology)  10/28/2019 8:48 AM

## 2019-10-28 NOTE — Telephone Encounter (Signed)
Please call to check how patient is doing.  I could not reach her.   She should be evaluated by her PCP or come over here for evaluation.

## 2019-11-03 ENCOUNTER — Other Ambulatory Visit: Payer: Self-pay | Admitting: Family Medicine

## 2019-11-10 ENCOUNTER — Encounter: Payer: Self-pay | Admitting: Family Medicine

## 2019-11-10 ENCOUNTER — Ambulatory Visit (INDEPENDENT_AMBULATORY_CARE_PROVIDER_SITE_OTHER): Payer: PPO

## 2019-11-10 ENCOUNTER — Other Ambulatory Visit: Payer: Self-pay

## 2019-11-10 DIAGNOSIS — Z23 Encounter for immunization: Secondary | ICD-10-CM | POA: Diagnosis not present

## 2019-11-11 ENCOUNTER — Other Ambulatory Visit: Payer: Self-pay | Admitting: Rheumatology

## 2019-11-11 NOTE — Telephone Encounter (Signed)
Last Visit:10/08/2019 Next Visit:01/14/2020  Current Dose per office note on 10/08/2019: Prednisone 5 mg 1 tablet by mouth daily  Okay to refill per Dr. Deveshwar  

## 2019-11-12 NOTE — Telephone Encounter (Signed)
Missing prescription information on page 5 (date, strength, pen/syringe)

## 2019-11-12 NOTE — Telephone Encounter (Signed)
Prescription form was fixed and refaxed to [employer]. Will follow up.  [employer] phone: [phone]

## 2019-11-12 NOTE — Telephone Encounter (Signed)
[employer] phone: [phone]

## 2019-11-18 ENCOUNTER — Encounter: Payer: Self-pay | Admitting: Physician Assistant

## 2019-11-18 ENCOUNTER — Ambulatory Visit (INDEPENDENT_AMBULATORY_CARE_PROVIDER_SITE_OTHER): Payer: PPO | Admitting: Physician Assistant

## 2019-11-18 ENCOUNTER — Other Ambulatory Visit: Payer: Self-pay

## 2019-11-18 ENCOUNTER — Telehealth: Payer: Self-pay

## 2019-11-18 VITALS — BP 150/100 | HR 67 | Temp 98.4°F | Ht 61.25 in | Wt 163.5 lb

## 2019-11-18 DIAGNOSIS — K5900 Constipation, unspecified: Secondary | ICD-10-CM

## 2019-11-18 NOTE — Telephone Encounter (Signed)
Please call and schedule OV for pt to discuss with Dr. Yong Channel.

## 2019-11-18 NOTE — Telephone Encounter (Signed)
Patient is scheduled with Samantha.

## 2019-11-18 NOTE — Telephone Encounter (Signed)
Nurse Assessment Nurse: Ysidro Evert, RN, Levada Dy Date/Time (Eastern Time): 11/18/2019 8:49:35 AM Confirm and document reason for call. If symptomatic, describe symptoms. ---Caller states she is constipated and hasn't had a BM in 5 days. No other symptoms Does the patient have any new or worsening symptoms? ---Yes Will a triage be completed? ---Yes Related visit to physician within the last 2 weeks? ---No Does the PT have any chronic conditions? (i.e. diabetes, asthma, this includes High risk factors for pregnancy, etc.) ---No Is this a behavioral health or substance abuse call? ---No Guidelines Guideline Title Affirmed Question Affirmed Notes Nurse Date/Time (Eastern Time) Constipation Last bowel movement (BM) > 4 days ago Ysidro Evert, Therapist, sports, Levada Dy 11/18/2019 8:50:30 AM Disp. Time [name] Time) Disposition Final User 11/18/2019 8:53:29 AM See PCP within 24 Hours Yes Ysidro Evert, RN, Marin Shutter Disagree/Comply Comply Caller Understands Yes PreDisposition Did not know what to do Care Advice Given Per Guideline PLEASE NOTE: All timestamps contained within this report are represented as [place] Standard Time. CONFIDENTIALTY NOTICE: This fax transmission is intended only for the addressee. It contains information that is legally privileged, confidential or otherwise protected from use or disclosure. If you are not the intended recipient, you are strictly prohibited from reviewing, disclosing, copying using or disseminating any of this information or taking any action in reliance on or regarding this information. If you have received this fax in error, please notify us immediately by telephone so that we can arrange for its return to [place]. Phone: [phone], Toll-Free: [phone], Fax: [phone] Page: 2 of 2 Call Id: [id number] Care Advice Given Per Guideline SEE PCP WITHIN 24 HOURS: CALL BACK IF: * You become worse CARE ADVICE given per Constipation (Adult) guideline. Comments User: Baruch Goldmann  Date/Time [name] Time): 11/18/2019 8:33:21 AM Caller transferred from office. Referrals REFERRED TO PCP OFFIC

## 2019-11-18 NOTE — Progress Notes (Signed)
[name] is a 69 y.o. female here for a new problem.  I acted as a Education administrator for [employer], PA-C [employer], LPN   History of Present Illness:   Chief Complaint  Patient presents with   Constipation    HPI   Constipation Pt c/o no bowel movement x one week. Pt has tried Dulcolax Sunday and Tuesday along with Colace x 100 mg. Passing little gas. Denies abdominal pain, diarrhea, rectal bleeding, nausea, vomiting.  Drinking fluids and having normal appetite.  Hx of c-section and lab chole. No prior hx of ileus or SBO.   Past Medical History:  Diagnosis Date   Anxiety    Arthritis    CAD (coronary artery disease)    Cataract    Depression    DISORDER, MENOPAUSAL NOS 03/20/2007   Qualifier: Diagnosis of  By: Arnoldo Morale MD, Balinda Quails    GERD (gastroesophageal reflux disease)    Hyperlipidemia    Hypothyroidism    MYOCARDIAL INFARCTION, HX OF 09/09/2007   Qualifier: Diagnosis of  By: Leanne Chang MD, Bruce     Osteoporosis    Polymyalgia rheumatica ([place])      Social History   Tobacco Use   Smoking status: Never Smoker   Smokeless tobacco: Never Used  Vaping Use   Vaping Use: Never used  Substance Use Topics   Alcohol use: No    Alcohol/week: 0.0 standard drinks   Drug use: No    Past Surgical History:  Procedure Laterality Date   CATARACT EXTRACTION     CESAREAN SECTION     CHOLECYSTECTOMY N/A 11/24/2012   Procedure: LAPAROSCOPIC CHOLECYSTECTOMY;  Surgeon: Ralene Ok, MD;  Location: [facility];  Service: General;  Laterality: N/A;   COSMETIC SURGERY     EYE SURGERY     eye lids lifted   PTCA      Family History  Problem Relation Age of Onset   Hypertension Father    Heart disease Father        Mi age 44, 38, 73 and 28.   Cancer Father    Hyperlipidemia Father    Colon cancer Maternal Uncle    Hypertension Mother    Cancer Mother    Hyperlipidemia Mother    Pulmonary fibrosis Sister    Epilepsy Son    Healthy Son     Healthy Son    Rheum arthritis Niece    Lupus Niece    Rheum arthritis Niece     Allergies  Allergen Reactions   Morphine And Related Nausea And Vomiting   Prochlorperazine     REACTION: nerve reaction   Simvastatin     REACTION: leg cramps   Sulfa Antibiotics     Current Medications:   Current Outpatient Medications:    aspirin 81 MG EC tablet, Take 81 mg by mouth daily. , Disp: , Rfl:    Evolocumab (REPATHA SURECLICK) 798 MG/ML SOAJ, Inject 1 pen into the skin every 14 (fourteen) days., Disp: 2 pen, Rfl: 11   folic acid (FOLVITE) 1 MG tablet, TAKE 1 TABLET BY MOUTH EVERY DAY, Disp: 90 tablet, Rfl: 2   levothyroxine (SYNTHROID) 100 MCG tablet, Take 1 tablet (100 mcg total) by mouth daily., Disp: 90 tablet, Rfl: 3   methotrexate (RHEUMATREX) 2.5 MG tablet, Take 4 tablets (10 mg) once a week on Friday evenings. Caution:Chemotherapy. Protect from light., Disp: 48 tablet, Rfl: 0   pantoprazole (PROTONIX) 40 MG tablet, Take 1 tablet (40 mg total) by mouth daily., Disp:  90 tablet, Rfl: 3   PARoxetine (PAXIL) 20 MG tablet, TAKE 1 TABLET BY MOUTH EVERY MORNING, Disp: 90 tablet, Rfl: 0   predniSONE (DELTASONE) 5 MG tablet, TAKE 1 TABLET BY MOUTH EVERY DAY WITH BREAKFAST, Disp: 30 tablet, Rfl: 0   Review of Systems:   ROS  Negative unless otherwise specified per HPI.  Vitals:   Vitals:   11/18/19 1004  BP: (!) 150/100  Pulse: 67  Temp: 98.4 F (36.9 C)  TempSrc: Temporal  SpO2: 96%  Weight: 163 lb 8 oz (74.2 kg)  Height: 5' 1.25" (1.556 m)     Body mass index is 30.64 kg/m.  Physical Exam:   Physical Exam Vitals and nursing note reviewed.  Constitutional:      General: She is not in acute distress.    Appearance: She is well-developed. She is not ill-appearing or toxic-appearing.  Cardiovascular:     Rate and Rhythm: Normal rate and regular rhythm.     Pulses: Normal pulses.     Heart sounds: Normal heart sounds, S1 normal and S2 normal.      Comments: No LE edema Pulmonary:     Effort: Pulmonary effort is normal.     Breath sounds: Normal breath sounds.  Abdominal:     General: Abdomen is flat. Bowel sounds are normal.     Palpations: Abdomen is soft.     Tenderness: There is no abdominal tenderness. There is no guarding or rebound. Negative signs include Murphy's sign and Rovsing's sign.  Skin:    General: Skin is warm and dry.  Neurological:     Mental Status: She is alert.     GCS: GCS eye subscore is 4. GCS verbal subscore is 5. GCS motor subscore is 6.  Psychiatric:        Speech: Speech normal.        Behavior: Behavior normal. Behavior is cooperative.       Assessment and Plan:   [name] was seen today for constipation.  Diagnoses and all orders for this visit:  Constipation, unspecified constipation type   No red flags on discussion. Start daily miralax (1 capful) and 100 mg colace BID. If after 3 days, no improvement, increase miralax to 2 capfuls. Push fluids. Worsening precautions extensively reviewed with patient -- recommend urgent evaluation if any of the following:  You have a fever and your symptoms suddenly get worse.  You leak stool or have blood in your stool.  Your abdomen is bloated.  You have severe pain in your abdomen.  You feel dizzy or you faint.  CMA or LPN served as scribe during this visit. History, Physical, and Plan performed by medical provider. The above documentation has been reviewed and is accurate and complete.  [name], PA-C

## 2019-11-18 NOTE — Patient Instructions (Signed)
It was great to see you!  100 mg colace twice daily 1 capful of miralax daily in beverage of choice  If symptoms not improved, increase miralax to 2 capfuls after 3 days  May also try an enema  Contact a health care provider if:  You have pain that gets worse.  You have a fever.  You do not have a bowel movement after 4 days.  You vomit.  You are not hungry.  You lose weight.  You are bleeding from the anus.  You have thin, pencil-like stools. Get help right away if:  You have a fever and your symptoms suddenly get worse.  You leak stool or have blood in your stool.  Your abdomen is bloated.  You have severe pain in your abdomen.  You feel dizzy or you faint.

## 2019-11-20 MED ORDER — TERIPARATIDE 620 MCG/2.48ML ~~LOC~~ SOPN: 20.0000 ug | PEN_INJECTOR | Freq: Every day | SUBCUTANEOUS | 2 refills | Status: DC

## 2019-11-20 NOTE — Addendum Note (Signed)
Addended by: Ofilia Neas on: 11/20/2019 12:19 PM   Modules accepted: Orders

## 2019-11-20 NOTE — Telephone Encounter (Signed)
Received notification from  [facility] regarding an Approval for [place] patient assistance from 11/20/19 to 02/12/20.   Phone number: [phone]  Rep advised that rx for Forteo on application had incorrect strength. Request new rx be sent to [facility]. Can be escribed.  Called patient, left message for patient to advise of approval. Pharmacy will reach out in 3-5 business days to schedule shipment.

## 2019-11-20 NOTE — Addendum Note (Signed)
Addended by: Carole Binning on: 11/20/2019 11:12 AM   Modules accepted: Orders

## 2019-11-30 ENCOUNTER — Encounter: Payer: Self-pay | Admitting: Family Medicine

## 2019-12-01 DIAGNOSIS — E785 Hyperlipidemia, unspecified: Secondary | ICD-10-CM

## 2019-12-01 DIAGNOSIS — Z5181 Encounter for therapeutic drug level monitoring: Secondary | ICD-10-CM

## 2019-12-01 NOTE — Telephone Encounter (Signed)
Patient has been scheduled

## 2019-12-01 NOTE — Telephone Encounter (Signed)
[phone]  Could someone verify this is her son? Im sorry. I do not know either of them well.

## 2019-12-02 ENCOUNTER — Encounter: Payer: Self-pay | Admitting: Rheumatology

## 2019-12-03 NOTE — Telephone Encounter (Signed)
It should be okay to add labs per patient's request.

## 2019-12-08 ENCOUNTER — Other Ambulatory Visit: Payer: Self-pay | Admitting: Rheumatology

## 2019-12-08 NOTE — Telephone Encounter (Signed)
Last Visit:10/08/2019 Next Visit:01/14/2020  Current Dose per office note on 10/08/2019: Prednisone 5 mg 1 tablet by mouth daily  Okay to refill per Dr. Estanislado Pandy

## 2019-12-13 ENCOUNTER — Encounter: Payer: Self-pay | Admitting: Rheumatology

## 2020-01-01 NOTE — Progress Notes (Signed)
Office Visit Note  Patient: [name]             Date of Birth: 28-Oct-1950           MRN: [id number]             PCP: [name], MD Referring: [name], MD Visit Date: 01/14/2020 Occupation: @GUAROCC @  Subjective:  Pain in multiple joints.   History of Present Illness: [name] is a 69 y.o. female history of polymyalgia rheumatica, osteoarthritis and degenerative disc disease.  She is unable to taper prednisone below 5 mg p.o. daily.  She is still on methotrexate 4 tablets p.o. weekly.  She continues to have some discomfort in her hands and knee joints.  She also continues to have discomfort in her right trochanteric bursa.  Her lower back hurts off and on.  She denies any joint swelling.  Activities of Daily Living:  Patient reports morning stiffness for 3 hours.   Patient Reports nocturnal pain.  Difficulty dressing/grooming: Denies Difficulty climbing stairs: Reports Difficulty getting out of chair: Denies Difficulty using hands for taps, buttons, cutlery, and/or writing: Denies  Review of Systems  Constitutional: Positive for fatigue.  HENT: Negative for mouth dryness.   Eyes: Negative for dryness.  Respiratory: Negative for shortness of breath.   Cardiovascular: Negative for swelling in legs/feet.  Gastrointestinal: Negative for constipation.  Endocrine: Positive for cold intolerance.  Genitourinary: Negative for difficulty urinating.  Musculoskeletal: Positive for arthralgias, joint pain and morning stiffness.  Skin: Negative for rash.  Allergic/Immunologic: Negative for susceptible to infections.  Neurological: Negative for numbness.  Hematological: Negative for bruising/bleeding tendency.  Psychiatric/Behavioral: Positive for sleep disturbance.    PMFS History:  Patient Active Problem List   Diagnosis Date Noted  . Lumbar compression fracture ([place]) 05/11/2019  . Educated about COVID-19 virus infection 01/26/2019  . Dyslipidemia  01/26/2019  . Drug-induced myopathy 01/14/2019  . Haglund's deformity 01/12/2019  . Myalgia 12/30/2018  . High risk medication use 12/04/2018  . Recurrent major depressive disorder, in full remission ([place]) 09/03/2018  . Lower esophageal ring ([place]) 03/03/2018  . Osteoarthritis of right hip 12/02/2017  . GERD (gastroesophageal reflux disease) 12/15/2015  . Polyp of colon, adenomatous 10/11/2014  . Vitamin D deficiency 05/26/2014  . Osteoporosis 05/26/2014  . Polymyalgia rheumatica ([place]) 12/01/2010  . CAD (coronary artery disease) 09/09/2007  . Hematuria 09/09/2007  . Depression 03/20/2007  . Hypothyroid 02/13/1999    Past Medical History:  Diagnosis Date  . Anxiety   . Arthritis   . CAD (coronary artery disease)   . Cataract   . Depression   . DISORDER, MENOPAUSAL NOS 03/20/2007   Qualifier: Diagnosis of  By: Arnoldo Morale MD, Balinda Quails   . GERD (gastroesophageal reflux disease)   . Hyperlipidemia   . Hypothyroidism   . MYOCARDIAL INFARCTION, HX OF 09/09/2007   Qualifier: Diagnosis of  By: Leanne Chang MD, Bruce    . Osteoporosis   . Polymyalgia rheumatica ([facility])     Family History  Problem Relation Age of Onset  . Hypertension Father   . Heart disease Father        Mi age 54, 22, 72 and 11.  . Cancer Father   . Hyperlipidemia Father   . Colon cancer Maternal Uncle   . Hypertension Mother   . Cancer Mother   . Hyperlipidemia Mother   . Pulmonary fibrosis Sister   . Epilepsy Son   . Healthy Son   . Myasthenia  gravis Son   . Healthy Son   . Rheum arthritis Niece   . Lupus Niece   . Rheum arthritis Niece    Past Surgical History:  Procedure Laterality Date  . CATARACT EXTRACTION    . CESAREAN SECTION    . CHOLECYSTECTOMY N/A 11/24/2012   Procedure: LAPAROSCOPIC CHOLECYSTECTOMY;  Surgeon: Ralene Ok, MD;  Location: [facility];  Service: General;  Laterality: N/A;  . COSMETIC SURGERY    . EYE SURGERY     eye lids lifted  . PTCA     Social History   Social History  Narrative   Husband and 2 adult children live at home. No grandkids. Son with epilepsy.       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    Immunization History  Administered Date(s) Administered  . Fluad Quad(high Dose 65+) 11/10/2019  . Influenza Whole 11/09/2008  . Influenza, High Dose Seasonal PF 10/30/2016, 12/02/2017, 11/08/2018  . Influenza-Unspecified 11/08/2018  . PFIZER SARS-COV-2 Vaccination 02/26/2019, 03/20/2019  . Pneumococcal Conjugate-13 10/11/2014  . Pneumococcal Polysaccharide-23 12/15/2015  . Td 02/12/2006  . Tdap 04/26/2017  . Zoster 09/19/2010     Objective: Vital Signs: BP (!) 145/83 (BP Location: Left Arm, Patient Position: Sitting, Cuff Size: Small)   Pulse 73   Resp 12   Ht 5' 1.5" (1.562 m)   Wt 164 lb (74.4 kg)   BMI 30.49 kg/m    Physical Exam Vitals and nursing note reviewed.  Constitutional:      Appearance: She is well-developed.  HENT:     Head: Normocephalic and atraumatic.  Eyes:     Conjunctiva/sclera: Conjunctivae normal.  Cardiovascular:     Rate and Rhythm: Normal rate and regular rhythm.     Heart sounds: Normal heart sounds.  Pulmonary:     Effort: Pulmonary effort is normal.     Breath sounds: Normal breath sounds.  Abdominal:     General: Bowel sounds are normal.     Palpations: Abdomen is soft.  Musculoskeletal:     Cervical back: Normal range of motion.  Lymphadenopathy:     Cervical: No cervical adenopathy.  Skin:    General: Skin is warm and dry.     Capillary Refill: Capillary refill takes less than 2 seconds.  Neurological:     Mental Status: She is alert and oriented to person, place, and time.  Psychiatric:        Behavior: Behavior normal.      Musculoskeletal Exam: C-spine was in good range of motion.  She has some discomfort in the lower lumbar region.  Shoulder joints, elbow joints, wrist joints with good range of motion.  She has DIP and PIP thickening with no synovitis.  She had limited range  of motion of her right hip joint for the right trochanteric bursa tenderness.  Left hip joint was in good range of motion.  Knee joints and ankles with good range of motion with no tenderness.  She had no tenderness across MTPs.  CDAI Exam: CDAI Score: -- Patient Global: --; Provider Global: -- Swollen: --; Tender: -- Joint Exam 01/14/2020   No joint exam has been documented for this visit   There is currently no information documented on the homunculus. Go to the Rheumatology activity and complete the homunculus joint exam.  Investigation: No additional findings.  Imaging: No results found.  Recent Labs: Lab Results  Component Value Date   WBC 6.8 01/12/2020   HGB 13.0 01/12/2020  PLT 261 01/12/2020   NA 142 01/12/2020   K 4.4 01/12/2020   CL 106 01/12/2020   CO2 28 01/12/2020   GLUCOSE 96 01/12/2020   BUN 14 01/12/2020   CREATININE 0.85 01/12/2020   BILITOT 0.5 01/12/2020   ALKPHOS 102 01/01/2019   AST 19 01/12/2020   ALT 19 01/12/2020   PROT 6.1 01/12/2020   ALBUMIN 3.7 01/01/2019   CALCIUM 9.3 01/12/2020   GFRAA 80 01/12/2020   QFTBGOLDPLUS NEGATIVE 08/12/2018    Speciality Comments: No specialty comments available.  Procedures:  No procedures performed Allergies: Morphine and related, Prochlorperazine, Simvastatin, and Sulfa antibiotics   Assessment / Plan:     Visit Diagnoses: Polymyalgia rheumatica ([place]) - Diagnosed 7 years ago by her previous rheumatologist.  She is unable to taper prednisone below 5 mg.  He had detailed discussion regarding tapering prednisone.  She is hesitant to taper prednisone.  She understands the side effects of long-term use of steroids.  She had no muscular weakness or tenderness.  Long term (current) use of systemic steroids - Prednisone 5 mg 1 tablet by mouth daily. Unable to taper at this time.  High risk medication use - Methotrexate 4 tablets by mouth once weekly and folic acid 1 mg by mouth daily. Prednisone 5 mg 1 tablet  daily.  Her labs have been stable.  We will continue to monitor labs every 3 months.  She is fully vaccinated against COVID-19.  She also received her booster.  She is up-to-date on all of her vaccines.  Age-related osteoporosis without current pathological fracture - Forteo 620 mg per 2.48 mL subcu daily. DXA June 08, 2019 T score -2.7 in the lumbar region with no significant change on comparison to the previous films.history of L1 compression fracture.  She will have repeat DEXA in 2023.  Vitamin D deficiency-she is on vitamin D supplement.  Have advised her to take vitamin D 2000 units daily.  Trochanteric bursitis, right hip-she continues to have some discomfort in the right trochanteric bursa.  IT band stretches were discussed.  Primary osteoarthritis of right hip-she has limited range of motion.  Primary osteoarthritis of both hands-joint protection muscle strengthening was discussed.  Primary osteoarthritis of both feet-she had Disperz on her calcaneum and the dorsum of her feet.  Proper fitting shoes were discussed.  DDD (degenerative disc disease), lumbar - Dr. Lorin Mercy. MRI of the lumbar spine was obtained on 04/23/2019.  She continues to have some lower back pain.  Core strengthening was discussed.  Adenomatous polyp of colon, unspecified part of colon  Coronary artery disease involving native coronary artery of native heart without angina pectoris  History of gastroesophageal reflux (GERD)  History of depression  History of hypothyroidism  Orders: No orders of the defined types were placed in this encounter.  No orders of the defined types were placed in this encounter.   Follow-Up Instructions: Return in about 5 months (around 06/13/2020) for Polymyalgia rheumatica, Osteoarthritis, Osteoporosis.   Bo Merino, MD  Note - This record has been created using Editor, commissioning.  Chart creation errors have been sought, but may not always  have been located. Such creation  errors do not reflect on  the standard of medical care.

## 2020-01-06 ENCOUNTER — Other Ambulatory Visit: Payer: Self-pay | Admitting: Family Medicine

## 2020-01-06 ENCOUNTER — Other Ambulatory Visit: Payer: Self-pay | Admitting: Rheumatology

## 2020-01-06 DIAGNOSIS — E034 Atrophy of thyroid (acquired): Secondary | ICD-10-CM

## 2020-01-06 DIAGNOSIS — M353 Polymyalgia rheumatica: Secondary | ICD-10-CM

## 2020-01-06 NOTE — Telephone Encounter (Signed)
Last Visit:10/08/2019 Next Visit:01/14/2020  Current Dose per office note on 10/08/2019: Prednisone 5 mg 1 tablet by mouth daily  Okay to refill per Dr. Estanislado Pandy

## 2020-01-12 ENCOUNTER — Other Ambulatory Visit: Payer: Self-pay | Admitting: *Deleted

## 2020-01-12 DIAGNOSIS — E559 Vitamin D deficiency, unspecified: Secondary | ICD-10-CM

## 2020-01-12 DIAGNOSIS — M353 Polymyalgia rheumatica: Secondary | ICD-10-CM

## 2020-01-12 DIAGNOSIS — Z79899 Other long term (current) drug therapy: Secondary | ICD-10-CM

## 2020-01-13 ENCOUNTER — Other Ambulatory Visit: Payer: Self-pay | Admitting: Cardiology

## 2020-01-13 LAB — CBC WITH DIFFERENTIAL/PLATELET
Absolute Monocytes: 360 cells/uL (ref 200–950)
Basophils Absolute: 61 cells/uL (ref 0–200)
Basophils Relative: 0.9 %
Eosinophils Absolute: 82 cells/uL (ref 15–500)
Eosinophils Relative: 1.2 %
HCT: 39.7 % (ref 35.0–45.0)
Hemoglobin: 13 g/dL (ref 11.7–15.5)
Lymphs Abs: 884 cells/uL (ref 850–3900)
MCH: 30.7 pg (ref 27.0–33.0)
MCHC: 32.7 g/dL (ref 32.0–36.0)
MCV: 93.6 fL (ref 80.0–100.0)
MPV: 11.1 fL (ref 7.5–12.5)
Monocytes Relative: 5.3 %
Neutro Abs: 5413 cells/uL (ref 1500–7800)
Neutrophils Relative %: 79.6 %
Platelets: 261 10*3/uL (ref 140–400)
RBC: 4.24 10*6/uL (ref 3.80–5.10)
RDW: 13.2 % (ref 11.0–15.0)
Total Lymphocyte: 13 %
WBC: 6.8 10*3/uL (ref 3.8–10.8)

## 2020-01-13 LAB — LIPID PANEL
Cholesterol: 165 mg/dL (ref <200)
HDL: 70 mg/dL (ref >=50)
LDL Cholesterol (Calc): 82 mg/dL (calc)
Non-HDL Cholesterol (Calc): 95 mg/dL (calc) (ref <130)
Total CHOL/HDL Ratio: 2.4 (calc) (ref <5.0)
Triglycerides: 51 mg/dL (ref <150)

## 2020-01-13 LAB — BASIC METABOLIC PANEL WITH GFR
BUN: 14 mg/dL (ref 7–25)
CO2: 28 mmol/L (ref 20–32)
Calcium: 9.3 mg/dL (ref 8.6–10.4)
Chloride: 106 mmol/L (ref 98–110)
Creat: 0.85 mg/dL (ref 0.60–0.93)
GFR, Est African American: 80 mL/min/{1.73_m2} (ref >=60)
GFR, Est Non African American: 69 mL/min/{1.73_m2} (ref >=60)
Glucose, Bld: 96 mg/dL (ref 65–99)
Potassium: 4.4 mmol/L (ref 3.5–5.3)
Sodium: 142 mmol/L (ref 135–146)

## 2020-01-13 LAB — HEPATIC FUNCTION PANEL
AG Ratio: 1.7 (calc) (ref 1.0–2.5)
ALT: 19 U/L (ref 6–29)
AST: 19 U/L (ref 10–35)
Albumin: 3.8 g/dL (ref 3.6–5.1)
Alkaline phosphatase (APISO): 97 U/L (ref 37–153)
Bilirubin, Direct: 0.1 mg/dL (ref 0.0–0.2)
Globulin: 2.3 g/dL (calc) (ref 1.9–3.7)
Indirect Bilirubin: 0.4 mg/dL (calc) (ref 0.2–1.2)
Total Bilirubin: 0.5 mg/dL (ref 0.2–1.2)
Total Protein: 6.1 g/dL (ref 6.1–8.1)

## 2020-01-13 LAB — VITAMIN D 25 HYDROXY (VIT D DEFICIENCY, FRACTURES): Vit D, 25-Hydroxy: 31 ng/mL (ref 30–100)

## 2020-01-13 NOTE — Progress Notes (Signed)
All the labs are within normal limits.  Vitamin D is low normal.  She should continue to take vitamin D supplement.

## 2020-01-14 ENCOUNTER — Ambulatory Visit: Payer: PPO | Admitting: Rheumatology

## 2020-01-14 ENCOUNTER — Encounter: Payer: Self-pay | Admitting: Rheumatology

## 2020-01-14 ENCOUNTER — Other Ambulatory Visit: Payer: Self-pay

## 2020-01-14 VITALS — BP 145/83 | HR 73 | Resp 12 | Ht 61.5 in | Wt 164.0 lb

## 2020-01-14 DIAGNOSIS — Z7952 Long term (current) use of systemic steroids: Secondary | ICD-10-CM

## 2020-01-14 DIAGNOSIS — Z79899 Other long term (current) drug therapy: Secondary | ICD-10-CM

## 2020-01-14 DIAGNOSIS — M1611 Unilateral primary osteoarthritis, right hip: Secondary | ICD-10-CM

## 2020-01-14 DIAGNOSIS — I2510 Atherosclerotic heart disease of native coronary artery without angina pectoris: Secondary | ICD-10-CM | POA: Diagnosis not present

## 2020-01-14 DIAGNOSIS — M19072 Primary osteoarthritis, left ankle and foot: Secondary | ICD-10-CM

## 2020-01-14 DIAGNOSIS — M353 Polymyalgia rheumatica: Secondary | ICD-10-CM

## 2020-01-14 DIAGNOSIS — M810 Age-related osteoporosis without current pathological fracture: Secondary | ICD-10-CM | POA: Diagnosis not present

## 2020-01-14 DIAGNOSIS — M19042 Primary osteoarthritis, left hand: Secondary | ICD-10-CM

## 2020-01-14 DIAGNOSIS — M5136 Other intervertebral disc degeneration, lumbar region: Secondary | ICD-10-CM | POA: Diagnosis not present

## 2020-01-14 DIAGNOSIS — E559 Vitamin D deficiency, unspecified: Secondary | ICD-10-CM

## 2020-01-14 DIAGNOSIS — M7061 Trochanteric bursitis, right hip: Secondary | ICD-10-CM | POA: Diagnosis not present

## 2020-01-14 DIAGNOSIS — Z8719 Personal history of other diseases of the digestive system: Secondary | ICD-10-CM

## 2020-01-14 DIAGNOSIS — M19041 Primary osteoarthritis, right hand: Secondary | ICD-10-CM | POA: Diagnosis not present

## 2020-01-14 DIAGNOSIS — M19071 Primary osteoarthritis, right ankle and foot: Secondary | ICD-10-CM | POA: Diagnosis not present

## 2020-01-14 DIAGNOSIS — Z8659 Personal history of other mental and behavioral disorders: Secondary | ICD-10-CM

## 2020-01-14 DIAGNOSIS — D126 Benign neoplasm of colon, unspecified: Secondary | ICD-10-CM | POA: Diagnosis not present

## 2020-01-14 DIAGNOSIS — Z8639 Personal history of other endocrine, nutritional and metabolic disease: Secondary | ICD-10-CM

## 2020-01-14 NOTE — Patient Instructions (Signed)
Standing Labs We placed an order today for your standing lab work.   Please have your standing labs drawn in March and every 3 months    If possible, please have your labs drawn 2 weeks prior to your appointment so that the provider can discuss your results at your appointment.  We have open lab daily Monday through Thursday from 8:30-12:30 PM and 1:30-4:30 PM and Friday from 8:30-12:30 PM and 1:30-4:00 PM at the office of Dr. Demico Ploch, [facility].   Please be advised, patients with office appointments requiring lab work will take precedents over walk-in lab work.  If possible, please come for your lab work on Monday and Friday afternoons, as you may experience shorter wait times. The office is located at [address], [address], [place], [place] [address] No appointment is necessary.   Labs are drawn by [facility]. Please bring your co-pay at the time of your lab draw.  You may receive a bill from [facility] for your lab work.  If you wish to have your labs drawn at another location, please call the office 24 hours in advance to send orders.  If you have any questions regarding directions or hours of operation,  please call [phone].   As a reminder, please drink plenty of water prior to coming for your lab work. Thanks!   

## 2020-01-16 ENCOUNTER — Other Ambulatory Visit: Payer: Self-pay | Admitting: Rheumatology

## 2020-01-16 DIAGNOSIS — M353 Polymyalgia rheumatica: Secondary | ICD-10-CM

## 2020-01-18 NOTE — Telephone Encounter (Signed)
Last Visit: 01/14/2020 Next Visit: 06/16/2020 Labs: 01/12/2020 All the labs are within normal limits  Current Dose per office note 01/14/2020: Methotrexate 4 tablets by mouth once weekly  DX: Polymyalgia rheumatica   Okay to refill per Dr. Estanislado Pandy

## 2020-02-05 ENCOUNTER — Encounter: Payer: Self-pay | Admitting: Family Medicine

## 2020-02-10 ENCOUNTER — Telehealth: Payer: Self-pay | Admitting: Family Medicine

## 2020-02-10 NOTE — Telephone Encounter (Signed)
Left message for patient to call back and schedule Medicare Annual Wellness Visit (AWV) either virtually OR in office.  ° °Last AWV 01/01/19; please schedule at anytime with [facility]-Nurse Health Advisor at [facility]. ° °This should be a 45 minute visit. ° ° °

## 2020-02-12 ENCOUNTER — Other Ambulatory Visit: Payer: Self-pay | Admitting: Rheumatology

## 2020-02-15 MED ORDER — PREDNISONE 5 MG PO TABS: ORAL_TABLET | ORAL | 2 refills | Status: DC

## 2020-02-15 NOTE — Telephone Encounter (Signed)
Last Visit: 01/14/2020 Next Visit: 06/16/2020  Current Dose per office note 01/14/2020:Prednisone 5 mg 1 tablet by mouth daily. Unable to taper at this time. DX: Polymyalgia rheumatica   Okay to refill per Dr. Corliss Skains

## 2020-02-15 NOTE — Addendum Note (Signed)
Addended by: Henriette Combs on: 02/15/2020 12:59 PM   Modules accepted: Orders

## 2020-02-16 NOTE — Telephone Encounter (Signed)
Tried calling the patient to see if we could get patient scheduled.

## 2020-02-23 ENCOUNTER — Other Ambulatory Visit: Payer: Self-pay | Admitting: Family Medicine

## 2020-02-29 ENCOUNTER — Encounter: Payer: Self-pay | Admitting: Orthopaedic Surgery

## 2020-03-01 ENCOUNTER — Other Ambulatory Visit: Payer: Self-pay | Admitting: Radiology

## 2020-03-01 ENCOUNTER — Telehealth: Payer: PPO | Admitting: Cardiology

## 2020-03-01 DIAGNOSIS — M545 Low back pain, unspecified: Secondary | ICD-10-CM

## 2020-03-02 ENCOUNTER — Telehealth: Payer: Self-pay | Admitting: Orthopaedic Surgery

## 2020-03-02 NOTE — Telephone Encounter (Signed)
Called pt and LVM #1 

## 2020-03-02 NOTE — Telephone Encounter (Signed)
Pt called stating she would like to get an appt for back pain.

## 2020-03-07 ENCOUNTER — Other Ambulatory Visit: Payer: Self-pay

## 2020-03-07 ENCOUNTER — Encounter: Payer: Self-pay | Admitting: Internal Medicine

## 2020-03-07 ENCOUNTER — Ambulatory Visit (INDEPENDENT_AMBULATORY_CARE_PROVIDER_SITE_OTHER): Payer: PPO | Admitting: Internal Medicine

## 2020-03-07 ENCOUNTER — Ambulatory Visit (INDEPENDENT_AMBULATORY_CARE_PROVIDER_SITE_OTHER): Payer: PPO

## 2020-03-07 VITALS — BP 148/82 | HR 70 | Temp 98.2°F | Ht 61.5 in | Wt 163.6 lb

## 2020-03-07 DIAGNOSIS — M542 Cervicalgia: Secondary | ICD-10-CM

## 2020-03-07 DIAGNOSIS — R0781 Pleurodynia: Secondary | ICD-10-CM

## 2020-03-07 MED ORDER — METHOCARBAMOL 500 MG PO TABS: 500.0000 mg | ORAL_TABLET | Freq: Four times a day (QID) | ORAL | 0 refills | Status: DC | PRN

## 2020-03-07 MED ORDER — TRAMADOL HCL 50 MG PO TABS: 50.0000 mg | ORAL_TABLET | Freq: Three times a day (TID) | ORAL | 0 refills | Status: AC | PRN

## 2020-03-07 NOTE — Patient Instructions (Signed)
  An xray was ordered.    Medications changes include :   methocarbamol - muscle relaxer.   Take tramadol three times as needed for pain.    Your prescription(s) have been submitted to your pharmacy. Please take as directed and contact our office if you believe you are having problem(s) with the medication(s).

## 2020-03-07 NOTE — Progress Notes (Signed)
Subjective:    Patient ID: [name], female    DOB: [birth year], 70 y.o.   MRN: [id number]  HPI The patient is here for an acute visit.  She works at [employer].  Last Thursday she was holding down a child to give him a shot and he kneed her in the left lateral chest - she has had pain there since.  She fell backwards at this time and her neck has been hurting since then.  The neck in both areas has not improved.  She has been taking ibuprofen 600 mg and it helps a little.  She does have osteoporosis and has broken a rib before and was concerned about the possibility of a fracture.  She has increased pain with movement, deep breaths and coughing.  Her neck hurts with movements.  She is having difficulty sleeping because of both pains.    Medications and allergies reviewed with patient and updated if appropriate.  Patient Active Problem List   Diagnosis Date Noted  . Lumbar compression fracture ([place]) 05/11/2019  . Educated about COVID-19 virus infection 01/26/2019  . Dyslipidemia 01/26/2019  . Drug-induced myopathy 01/14/2019  . Haglund's deformity 01/12/2019  . Myalgia 12/30/2018  . High risk medication use 12/04/2018  . Recurrent major depressive disorder, in full remission ([place]) 09/03/2018  . Lower esophageal ring ([place]) 03/03/2018  . Osteoarthritis of right hip 12/02/2017  . GERD (gastroesophageal reflux disease) 12/15/2015  . Polyp of colon, adenomatous 10/11/2014  . Vitamin D deficiency 05/26/2014  . Osteoporosis 05/26/2014  . Polymyalgia rheumatica ([place]) 12/01/2010  . CAD (coronary artery disease) 09/09/2007  . Hematuria 09/09/2007  . Depression 03/20/2007  . Hypothyroid 02/13/1999    Current Outpatient Medications on File Prior to Visit  Medication Sig Dispense Refill  . aspirin 81 MG EC tablet Take 81 mg by mouth daily.    . folic acid (FOLVITE) 1 MG tablet TAKE 1 TABLET BY MOUTH EVERY DAY 90 tablet 2  . levothyroxine (SYNTHROID) 100 MCG  tablet TAKE 1 TABLET BY MOUTH DAILY 90 tablet 3  . methotrexate 2.5 MG tablet Take 4 tablets (10 mg) once a week on Friday evenings. Caution:Chemotherapy. Protect from light. 48 tablet 0  . pantoprazole (PROTONIX) 40 MG tablet Take 1 tablet (40 mg total) by mouth daily. 90 tablet 3  . PARoxetine (PAXIL) 20 MG tablet TAKE 1 TABLET BY MOUTH EVERY MORNING 90 tablet 0  . predniSONE (DELTASONE) 5 MG tablet TAKE 1 TABLET BY MOUTH EVERY DAY WITH BREAKFAST 30 tablet 2  . REPATHA SURECLICK 124 MG/ML SOAJ INJECT 1 PEN SUBCUTANEOUSLY EVERY 14 DAYS 2 mL 11  . Teriparatide, Recombinant, (FORTEO) 620 MCG/2.48ML SOPN Inject 20 mcg into the skin daily. 2.24 mL 2   No current facility-administered medications on file prior to visit.    Past Medical History:  Diagnosis Date  . Anxiety   . Arthritis   . CAD (coronary artery disease)   . Cataract   . Depression   . DISORDER, MENOPAUSAL NOS 03/20/2007   Qualifier: Diagnosis of  By: Arnoldo Morale MD, Balinda Quails   . GERD (gastroesophageal reflux disease)   . Hyperlipidemia   . Hypothyroidism   . MYOCARDIAL INFARCTION, HX OF 09/09/2007   Qualifier: Diagnosis of  By: Leanne Chang MD, Bruce    . Osteoporosis   . Polymyalgia rheumatica (HCC)     Past Surgical History:  Procedure Laterality Date  . CATARACT EXTRACTION    . CESAREAN SECTION    . CHOLECYSTECTOMY  N/A 11/24/2012   Procedure: LAPAROSCOPIC CHOLECYSTECTOMY;  Surgeon: Ralene Ok, MD;  Location: [facility];  Service: General;  Laterality: N/A;  . COSMETIC SURGERY    . EYE SURGERY     eye lids lifted  . PTCA      Social History   Socioeconomic History  . Marital status: Married    Spouse name: Not on file  . Number of children: Not on file  . Years of education: Not on file  . Highest education level: Not on file  Occupational History  . Not on file  Tobacco Use  . Smoking status: Never Smoker  . Smokeless tobacco: Never Used  Vaping Use  . Vaping Use: Never used  Substance and Sexual Activity  .  Alcohol use: No    Alcohol/week: 0.0 standard drinks  . Drug use: No  . Sexual activity: Not on file  Other Topics Concern  . Not on file  Social History Narrative   Husband and 2 adult children live at home. No grandkids. Son with epilepsy.       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 No part time.    Social Determinants of Health   Financial Resource Strain: Not on file  Food Insecurity: Not on file  Transportation Needs: Not on file  Physical Activity: Not on file  Stress: Not on file  Social Connections: Not on file    Family History  Problem Relation Age of Onset  . Hypertension Father   . Heart disease Father        Mi age 67, 57, 28 and 54.  . Cancer Father   . Hyperlipidemia Father   . Colon cancer Maternal Uncle   . Hypertension Mother   . Cancer Mother   . Hyperlipidemia Mother   . Pulmonary fibrosis Sister   . Epilepsy Son   . Healthy Son   . Myasthenia gravis Son   . Healthy Son   . Rheum arthritis Niece   . Lupus Niece   . Rheum arthritis Niece     Review of Systems  Constitutional: Negative for fever.  Respiratory: Negative for cough, shortness of breath and wheezing.   Cardiovascular: Positive for chest pain (left lateral ribs).  Musculoskeletal: Positive for neck pain and neck stiffness.  Skin: Negative for color change and wound.  Neurological: Negative for dizziness, light-headedness and headaches.       Objective:   Vitals:   03/07/20 1430  BP: (!) 148/82  Pulse: 70  Temp: 98.2 F (36.8 C)  SpO2: 97%   BP Readings from Last 3 Encounters:  03/07/20 (!) 148/82  01/14/20 (!) 145/83  11/18/19 (!) 150/100   Wt Readings from Last 3 Encounters:  03/07/20 163 lb 9.6 oz (74.2 kg)  01/14/20 164 lb (74.4 kg)  11/18/19 163 lb 8 oz (74.2 kg)   Body mass index is 30.41 kg/m.   Physical Exam Constitutional:      General: She is not in acute distress.    Appearance: Normal appearance. She is not ill-appearing.  HENT:     Head:  Normocephalic and atraumatic.  Cardiovascular:     Rate and Rhythm: Normal rate and regular rhythm.  Pulmonary:     Effort: Pulmonary effort is normal. No respiratory distress.     Breath sounds: No wheezing or rales.  Chest:     Chest wall: Tenderness (Left lateral ribs-approximately fifth rib in that general area) present.  Musculoskeletal:        General:  Tenderness (Posterior neck and left and right side.  Increased pain with movement of head.  No C-spine tenderness) present.  Skin:    General: Skin is warm and dry.     Findings: No bruising.  Neurological:     Mental Status: She is alert.            Assessment & Plan:    Left-sided rib pain: Acute Occurred after being kneed in the left lateral ribs by a child she was trying to give an injection to Contusion versus fracture Will get rib x-ray given her osteoporosis and previous rib fracture She is aware symptomatic treatment is the only treatment She has not been sleeping We will prescribe tramadol 50 mg 3 times daily as needed-5-day supply given She will not take this when driving or working  Bilateral posterior neck pain: Acute Muscular in nature-whiplash injury from fall last week No concerning symptoms or findings on exam Continue ibuprofen 600 mg 3 times daily with food during the day Trial of methocarbamol 500 mg every 6 hours as needed-she has taken this before in the past Can try heat or ice Will be taking tramadol for left rib pain, which may also help   This visit occurred during the SARS-CoV-2 public health emergency.  Safety protocols were in place, including screening questions prior to the visit, additional usage of staff PPE, and extensive cleaning of exam room while observing appropriate contact time as indicated for disinfecting solutions.

## 2020-03-08 ENCOUNTER — Telehealth: Payer: Self-pay | Admitting: Family Medicine

## 2020-03-08 NOTE — Telephone Encounter (Signed)
  Patient seen 01/24 by Dr Quay Burow, requesting return to work note.   Reason for letter: Plans to return to work 03/09/20 (if request is to be out of work, ask how long):   Patient would like to pick letter up from office

## 2020-03-08 NOTE — Telephone Encounter (Signed)
Spoke with patient today. 

## 2020-03-08 NOTE — Telephone Encounter (Signed)
Letter printed.

## 2020-03-09 ENCOUNTER — Other Ambulatory Visit: Payer: Self-pay | Admitting: Rheumatology

## 2020-03-09 DIAGNOSIS — M353 Polymyalgia rheumatica: Secondary | ICD-10-CM

## 2020-03-09 NOTE — Telephone Encounter (Signed)
Last Visit: 01/14/2020 Next Visit: 06/16/2020 Labs: 01/12/2020, All the labs are within normal limits. Vitamin D is low normal. She should continue to take vitamin D supplement.  Current Dose per office note 01/14/2020, Methotrexate 4 tablets by mouth once weekly  DX: Polymyalgia rheumatica   Okay to refill MTX?

## 2020-03-10 ENCOUNTER — Encounter: Payer: Self-pay | Admitting: Family Medicine

## 2020-03-17 ENCOUNTER — Telehealth: Payer: Self-pay | Admitting: Physical Medicine and Rehabilitation

## 2020-03-17 NOTE — Telephone Encounter (Signed)
Patient called needing to reschedule her appointment with Dr Ernestina Patches

## 2020-03-18 ENCOUNTER — Telehealth: Payer: Self-pay | Admitting: Physical Medicine and Rehabilitation

## 2020-03-18 NOTE — Telephone Encounter (Signed)
Rescheduled

## 2020-03-18 NOTE — Telephone Encounter (Signed)
Patient called requesting a call back. Patient needs to reschedule appt. Please call patient at [phone]

## 2020-03-18 NOTE — Telephone Encounter (Signed)
Left message #1

## 2020-03-23 ENCOUNTER — Encounter: Payer: Self-pay | Admitting: Family Medicine

## 2020-03-24 ENCOUNTER — Ambulatory Visit: Payer: PPO | Admitting: Physical Medicine and Rehabilitation

## 2020-03-29 ENCOUNTER — Encounter: Payer: Self-pay | Admitting: Physical Medicine and Rehabilitation

## 2020-03-29 ENCOUNTER — Ambulatory Visit (INDEPENDENT_AMBULATORY_CARE_PROVIDER_SITE_OTHER): Payer: PPO | Admitting: Physical Medicine and Rehabilitation

## 2020-03-29 ENCOUNTER — Ambulatory Visit: Payer: Self-pay

## 2020-03-29 ENCOUNTER — Other Ambulatory Visit: Payer: Self-pay

## 2020-03-29 VITALS — BP 138/81 | HR 78

## 2020-03-29 DIAGNOSIS — M5416 Radiculopathy, lumbar region: Secondary | ICD-10-CM | POA: Diagnosis not present

## 2020-03-29 MED ORDER — BETAMETHASONE SOD PHOS & ACET 6 (3-3) MG/ML IJ SUSP
12.0000 mg | Freq: Once | INTRAMUSCULAR | Status: AC
  Administered 2020-03-29: 12 mg

## 2020-03-29 NOTE — Patient Instructions (Signed)

## 2020-03-29 NOTE — Progress Notes (Signed)
Numeric Pain Rating Scale and Functional Assessment Average Pain 7   In the last MONTH (on 0-10 scale) has pain interfered with the following?  1. General activity like being  able to carry out your everyday physical activities such as walking, climbing stairs, carrying groceries, or moving a chair?  Rating(10)   +Driver, -BT, -Dye Allergies.  Pain in mid back, using dryer, dish washer, & Vacuuming, bending over causes her pain

## 2020-04-01 ENCOUNTER — Telehealth: Payer: Self-pay | Admitting: Pharmacist

## 2020-04-01 NOTE — Telephone Encounter (Signed)
Medication Samples have been provided to the patient.  Drug name: Repatha SureClick      Strength: 140mg         Qty: 1 LOT:  [id number] Exp.Date: 12/23  Kip Cropp Rodriguez-Guzman PharmD, BCPS, [place] [address] [place] [address] 04/01/2020 2:09 PM

## 2020-04-04 ENCOUNTER — Telehealth: Payer: Self-pay

## 2020-04-04 NOTE — Telephone Encounter (Signed)
Called and clarified that the pt is coming in today to get a sample and the form with healthwell grant information and the pt voiced understanding

## 2020-04-04 NOTE — Telephone Encounter (Signed)
-----   Message from Harrington Challenger, [place] sent at 04/01/2020  2:06 PM EST ----- Regarding: [employer] foundation Looks like you tried to send and e-mail to Ms Gayler twice and she still unable to get it.  Please print her approval form and attache it to the reptha sample in the refrigerator. She plan to pick it up Monday.  Thanks

## 2020-04-05 NOTE — Telephone Encounter (Signed)
For you -

## 2020-04-06 NOTE — Progress Notes (Signed)
Cardiology Office Note   Date:  04/07/2020   ID:  [name], DOB 1950-06-07, MRN [id number]  PCP:  [name], MD  Cardiologist:   Minus Breeding, MD  Referring:  [name], MD   Chief Complaint  Patient presents with  . Coronary Artery Disease     History of Present Illness: [name] is a 70 y.o. female who presents for follow-up of coronary disease. He's been several years since she was seen. She had a marginal branch myocardial infarction in 2009. There was a long 99% stenosis which was reduced to 50% stenosis with dissection and TIMI-3 flow. She was managed medically this and has done well.   Since I last saw her she has done well.  The patient denies any new symptoms such as chest discomfort, neck or arm discomfort. There has been no new shortness of breath, PND or orthopnea. There have been no reported palpitations, presyncope or syncope.  She retired.  She is not as active as I would like however.     Past Medical History:  Diagnosis Date  . Anxiety   . Arthritis   . CAD (coronary artery disease)   . Cataract   . Depression   . DISORDER, MENOPAUSAL NOS 03/20/2007   Qualifier: Diagnosis of  By: Arnoldo Morale MD, Balinda Quails   . GERD (gastroesophageal reflux disease)   . Hyperlipidemia   . Hypothyroidism   . MYOCARDIAL INFARCTION, HX OF 09/09/2007   Qualifier: Diagnosis of  By: Leanne Chang MD, Bruce    . Osteoporosis   . Polymyalgia rheumatica (HCC)     Past Surgical History:  Procedure Laterality Date  . CATARACT EXTRACTION    . CESAREAN SECTION    . CHOLECYSTECTOMY N/A 11/24/2012   Procedure: LAPAROSCOPIC CHOLECYSTECTOMY;  Surgeon: Ralene Ok, MD;  Location: [facility];  Service: General;  Laterality: N/A;  . COSMETIC SURGERY    . EYE SURGERY     eye lids lifted  . PTCA       Current Outpatient Medications  Medication Sig Dispense Refill  . aspirin 81 MG EC tablet Take 81 mg by mouth daily.    . folic acid (FOLVITE) 1 MG tablet TAKE 1 TABLET  BY MOUTH EVERY DAY 90 tablet 2  . levothyroxine (SYNTHROID) 100 MCG tablet TAKE 1 TABLET BY MOUTH DAILY 90 tablet 3  . methotrexate 2.5 MG tablet TAKE 4 tablets (10 MG) BY MOUTH ONCE A WEEK ON Friday evenings. Caution:Chemotherapy. Protect from light. 48 tablet 0  . pantoprazole (PROTONIX) 40 MG tablet Take 1 tablet (40 mg total) by mouth daily. 90 tablet 3  . PARoxetine (PAXIL) 20 MG tablet TAKE 1 TABLET BY MOUTH EVERY MORNING 90 tablet 0  . predniSONE (DELTASONE) 5 MG tablet TAKE 1 TABLET BY MOUTH EVERY DAY WITH BREAKFAST 30 tablet 2  . REPATHA SURECLICK 474 MG/ML SOAJ INJECT 1 PEN SUBCUTANEOUSLY EVERY 14 DAYS 2 mL 11   Current Facility-Administered Medications  Medication Dose Route Frequency Provider Last Rate Last Admin  . betamethasone acetate-betamethasone sodium phosphate (CELESTONE) injection 12 mg  12 mg Other Once Magnus Sinning, MD        Allergies:   Morphine and related, Prochlorperazine, Simvastatin, and Sulfa antibiotics    ROS:  Please see the history of present illness.   Otherwise, review of systems are positive for none.   All other systems are reviewed and negative.    PHYSICAL EXAM: VS:  BP 130/90   Pulse 65  Ht 5' 1.5" (1.562 m)   Wt 158 lb (71.7 kg)   SpO2 96%   BMI 29.37 kg/m  , BMI Body mass index is 29.37 kg/m. GENERAL:  Well appearing NECK:  No jugular venous distention, waveform within normal limits, carotid upstroke brisk and symmetric, no bruits, no thyromegaly LUNGS:  Clear to auscultation bilaterally CHEST:  Unremarkable HEART:  PMI not displaced or sustained,S1 and S2 within normal limits, no S3, no S4, no clicks, no rubs, no murmurs ABD:  Flat, positive bowel sounds normal in frequency in pitch, no bruits, no rebound, no guarding, no midline pulsatile mass, no hepatomegaly, no splenomegaly EXT:  2 plus pulses throughout, no edema, no cyanosis no clubbing    EKG:  EKG is   ordered today. The ekg ordered today demonstrates sinus rhythm, rate  65, left axis deviation, low voltage in the chest leads, poor anterior R wave progression, nonspecific T-wave flattening.  PVCs  Recent Labs: 10/08/2019: TSH 0.51 01/12/2020: ALT 19; BUN 14; Creat 0.85; Hemoglobin 13.0; Platelets 261; Potassium 4.4; Sodium 142    Lipid Panel    Component Value Date/Time   CHOL 165 01/12/2020 0834   CHOL 266 (H) 01/01/2019 1010   TRIG 51 01/12/2020 0834   HDL 70 01/12/2020 0834   HDL 82 01/01/2019 1010   CHOLHDL 2.4 01/12/2020 0834   VLDL 16.8 07/29/2017 1430   LDLCALC 82 01/12/2020 0834   LDLDIRECT 176.0 12/15/2015 1103      Wt Readings from Last 3 Encounters:  04/07/20 158 lb (71.7 kg)  03/07/20 163 lb 9.6 oz (74.2 kg)  01/14/20 164 lb (74.4 kg)      Other studies Reviewed: Additional studies/ records that were reviewed today include: Labs Review of the above records demonstrates:  See elsewhere   ASSESSMENT AND PLAN:   CAD:   The patient has no new sypmtoms.  No further cardiovascular testing is indicated.  We will continue with aggressive risk reduction and meds as listed.  DYSLIPIDEMIA:  She is intolerant of statins.  She is not on Repatha and her LDL came down to 82 from 175.  No change in therapy.   Current medicines are reviewed at length with the patient today.  The patient does not have concerns regarding medicines.  The following changes have been made:  None  Labs/ tests ordered today include:  None  Orders Placed This Encounter  Procedures  . EKG 12-Lead     Disposition:   FU with me in one year.     Signed, Minus Breeding, MD  04/07/2020 11:24 AM    [facility]

## 2020-04-07 ENCOUNTER — Encounter: Payer: Self-pay | Admitting: Cardiology

## 2020-04-07 ENCOUNTER — Ambulatory Visit: Payer: PPO | Admitting: Cardiology

## 2020-04-07 ENCOUNTER — Other Ambulatory Visit: Payer: Self-pay

## 2020-04-07 VITALS — BP 130/90 | HR 65 | Ht 61.5 in | Wt 158.0 lb

## 2020-04-07 DIAGNOSIS — I2510 Atherosclerotic heart disease of native coronary artery without angina pectoris: Secondary | ICD-10-CM

## 2020-04-07 DIAGNOSIS — E785 Hyperlipidemia, unspecified: Secondary | ICD-10-CM | POA: Diagnosis not present

## 2020-04-07 NOTE — Patient Instructions (Signed)
Medication Instructions:  No changes *If you need a refill on your cardiac medications before your next appointment, please call your pharmacy*  Follow-Up: At [facility], you and your health needs are our priority.  As part of our continuing mission to provide you with exceptional heart care, we have created designated Provider Care Teams.  These Care Teams include your primary Cardiologist (physician) and Advanced Practice Providers (APPs -  Physician Assistants and Nurse Practitioners) who all work together to provide you with the care you need, when you need it.  Your next appointment:   12 month(s) You will receive a reminder letter in the mail two months in advance. If you don't receive a letter, please call our office to schedule the follow-up appointment.  The format for your next appointment:   In Person  Provider:   James Hochrein, MD   

## 2020-04-20 ENCOUNTER — Other Ambulatory Visit: Payer: Self-pay | Admitting: *Deleted

## 2020-04-20 DIAGNOSIS — Z79899 Other long term (current) drug therapy: Secondary | ICD-10-CM

## 2020-04-21 NOTE — Procedures (Signed)
Lumbar Epidural Steroid Injection - Interlaminar Approach with Fluoroscopic Guidance  Patient: [name]      Date of Birth: 1950/03/21 MRN: [id number] PCP: [name], MD      Visit Date: 03/29/2020   Universal Protocol:     Consent Given By: the patient  Position: PRONE  Additional Comments: Vital signs were monitored before and after the procedure. Patient was prepped and draped in the usual sterile fashion. The correct patient, procedure, and site was verified.   Injection Procedure Details:   Procedure diagnoses: Lumbar radiculopathy [M54.16]   Meds Administered:  Meds ordered this encounter  Medications  . betamethasone acetate-betamethasone sodium phosphate (CELESTONE) injection 12 mg     Laterality: Right  Location/Site:  L2-L3  Needle: 3.5 in., 20 ga. Tuohy  Needle Placement: Paramedian epidural  Findings:   -Comments: Excellent flow of contrast into the epidural space.  Procedure Details: Using a paramedian approach from the side mentioned above, the region overlying the inferior lamina was localized under fluoroscopic visualization and the soft tissues overlying this structure were infiltrated with 4 ml. of 1% Lidocaine without Epinephrine. The Tuohy needle was inserted into the epidural space using a paramedian approach.   The epidural space was localized using loss of resistance along with counter oblique bi-planar fluoroscopic views.  After negative aspirate for air, blood, and CSF, a 2 ml. volume of Isovue-250 was injected into the epidural space and the flow of contrast was observed. Radiographs were obtained for documentation purposes.    The injectate was administered into the level noted above.   Additional Comments:  The patient tolerated the procedure well Dressing: 2 x 2 sterile gauze and Band-Aid    Post-procedure details: Patient was observed during the procedure. Post-procedure instructions were reviewed.  Patient left the  clinic in stable condition.

## 2020-04-21 NOTE — Progress Notes (Signed)
[name] - 70 y.o. female MRN [id number]  Date of birth: 1950/12/17  Office Visit Note: Visit Date: 03/29/2020 PCP: [name], MD Referred by: [name], MD  Subjective: Chief Complaint  Patient presents with  . Lower Back - Pain   HPI:  [name] is a 71 y.o. female who comes in today At the request of Dr. Leana Gamer for lumbar epidural injection.  It appears he received a phone call from her and suggested she have an epidural injection for her upper back pain.  The last time I saw her was in July 2021 for issues with her foot.  She is followed by Dr. Cy Blamer as well with history of polymyalgia rheumatica, osteoporosis and arthritis.  She reports thoracolumbar upper lumbar pain particularly with functional activities vacuuming etc.  She denies any frank pain down the legs to get some referral in the buttocks.  MRI completed last year shows changes at about the ill 2-3 and L3-4 level but without any high-grade stenosis.  There is more left-sided issues at L4-5 but she does not have frank left radicular pain.  Will complete right L2-3 interlaminar injection diagnostically and hopefully therapeutically.  Depending on relief would suggest physical therapy with perhaps dry needling for more myofascial pain syndrome versus lower injection if lower pain were needed.  Could consider medial branch blocks diagnostically.  She will follow up with Dr. Rodell Perna as needed.  ROS Otherwise per HPI.  Assessment & Plan: Visit Diagnoses:    ICD-10-CM   1. Lumbar radiculopathy  M54.16 XR C-ARM NO REPORT    Epidural Steroid injection    betamethasone acetate-betamethasone sodium phosphate (CELESTONE) injection 12 mg    Plan: No additional findings.   Meds & Orders:  Meds ordered this encounter  Medications  . betamethasone acetate-betamethasone sodium phosphate (CELESTONE) injection 12 mg    Orders Placed This Encounter  Procedures  . XR C-ARM NO REPORT  . Epidural  Steroid injection    Follow-up: Return if symptoms worsen or fail to improve.   Procedures: No procedures performed  Lumbar Epidural Steroid Injection - Interlaminar Approach with Fluoroscopic Guidance  Patient: [name]      Date of Birth: November 15, 1950 MRN: [id number] PCP: [name], MD      Visit Date: 03/29/2020   Universal Protocol:     Consent Given By: the patient  Position: PRONE  Additional Comments: Vital signs were monitored before and after the procedure. Patient was prepped and draped in the usual sterile fashion. The correct patient, procedure, and site was verified.   Injection Procedure Details:   Procedure diagnoses: Lumbar radiculopathy [M54.16]   Meds Administered:  Meds ordered this encounter  Medications  . betamethasone acetate-betamethasone sodium phosphate (CELESTONE) injection 12 mg     Laterality: Right  Location/Site:  L2-L3  Needle: 3.5 in., 20 ga. Tuohy  Needle Placement: Paramedian epidural  Findings:   -Comments: Excellent flow of contrast into the epidural space.  Procedure Details: Using a paramedian approach from the side mentioned above, the region overlying the inferior lamina was localized under fluoroscopic visualization and the soft tissues overlying this structure were infiltrated with 4 ml. of 1% Lidocaine without Epinephrine. The Tuohy needle was inserted into the epidural space using a paramedian approach.   The epidural space was localized using loss of resistance along with counter oblique bi-planar fluoroscopic views.  After negative aspirate for air, blood, and CSF, a 2 ml. volume of  Isovue-250 was injected into the epidural space and the flow of contrast was observed. Radiographs were obtained for documentation purposes.    The injectate was administered into the level noted above.   Additional Comments:  The patient tolerated the procedure well Dressing: 2 x 2 sterile gauze and Band-Aid     Post-procedure details: Patient was observed during the procedure. Post-procedure instructions were reviewed.  Patient left the clinic in stable condition.     Clinical History: MRI LUMBAR SPINE WITHOUT CONTRAST  TECHNIQUE: Multiplanar, multisequence MR imaging of the lumbar spine was performed. No intravenous contrast was administered.  COMPARISON:  MRI of the lumbar spine June 12, 2014  FINDINGS: Segmentation:  Standard.  Alignment:  There is a levoscoliosis of the lumbar spine.  Vertebrae: Chronic fracture of the L1 vertebral body with loss of approximately 60% of the vertebral body height with mild retropulsion of the superior aspect of the posterior wall into the spinal canal without significant spinal canal stenosis.  Marrow edema is seen in the is superior articular process of L5 on the left side related to degenerative changes.  No acute fracture, evidence of discitis, or bone lesion.  Conus medullaris and cauda equina: Conus extends to the T12-L1 level. Conus and cauda equina appear normal.  Paraspinal and other soft tissues: Negative.  Disc levels:  T12-L1: Small retropulsion of the posterior wall into the spinal canal without significant spinal canal stenosis. Perineural cysts noted on the right side. There is no neural foraminal stenosis.  L1-2: No spinal canal or neural foraminal stenosis.  L2-3: Shallow disc bulge resulting in mild bilateral neural foraminal narrowing. No spinal canal stenosis.  L3-4: Shallow disc bulge and mild facet degenerative changes with ligamentum flavum redundancy resulting in mild left neural foraminal narrowing. No spinal canal stenosis.  L4-5: Disc bulge with superimposed left central disc protrusion, facet degenerative changes with mild ligamentum flavum redundancy resulting in mild spinal canal stenosis with narrowing of the left subarticular zone, mild right and moderate left neural  foraminal narrowing.  L5-S1: Facet degenerative changes, left greater than right. No spinal canal or neural foraminal stenosis.  IMPRESSION: 1. No acute fracture of the lumbar spine. 2. Chronic L1 compression fracture with loss of approximately 60% of vertebral body height with mild retropulsion of the posterior wall into the spinal canal without significant spinal canal stenosis. 3. Multilevel degenerative changes of the lumbar spine, worst at L4-L5 with mild spinal canal stenosis, narrowing of the left subarticular zone, mild right and moderate left neural foraminal narrowing.   Electronically Signed   By: Pedro Earls M.D.   On: 04/24/2019 11:13     Objective:  VS:  HT:    WT:   BMI:     BP:138/81  HR:78bpm  TEMP: ( )  RESP:  Physical Exam Vitals and nursing note reviewed.  Constitutional:      General: She is not in acute distress.    Appearance: Normal appearance. She is not ill-appearing.  HENT:     Head: Normocephalic and atraumatic.     Right Ear: External ear normal.     Left Ear: External ear normal.  Eyes:     Extraocular Movements: Extraocular movements intact.  Cardiovascular:     Rate and Rhythm: Normal rate.     Pulses: Normal pulses.  Pulmonary:     Effort: Pulmonary effort is normal. No respiratory distress.  Abdominal:     General: There is no distension.     Palpations:  Abdomen is soft.  Musculoskeletal:        General: Tenderness present.     Cervical back: Neck supple.     Right lower leg: No edema.     Left lower leg: No edema.     Comments: Patient has good distal strength with no pain over the greater trochanters.  No clonus or focal weakness.  Skin:    Findings: No erythema, lesion or rash.  Neurological:     General: No focal deficit present.     Mental Status: She is alert and oriented to person, place, and time.     Sensory: No sensory deficit.     Motor: No weakness or abnormal muscle tone.      Coordination: Coordination normal.  Psychiatric:        Mood and Affect: Mood normal.        Behavior: Behavior normal.      Imaging: No results found.

## 2020-04-22 ENCOUNTER — Other Ambulatory Visit: Payer: Self-pay

## 2020-04-22 DIAGNOSIS — Z79899 Other long term (current) drug therapy: Secondary | ICD-10-CM

## 2020-04-23 LAB — CBC WITH DIFFERENTIAL/PLATELET
Absolute Monocytes: 490 cells/uL (ref 200–950)
Basophils Absolute: 57 cells/uL (ref 0–200)
Basophils Relative: 0.8 %
Eosinophils Absolute: 241 cells/uL (ref 15–500)
Eosinophils Relative: 3.4 %
HCT: 41.2 % (ref 35.0–45.0)
Hemoglobin: 13.5 g/dL (ref 11.7–15.5)
Lymphs Abs: 717 cells/uL — ABNORMAL LOW (ref 850–3900)
MCH: 30.8 pg (ref 27.0–33.0)
MCHC: 32.8 g/dL (ref 32.0–36.0)
MCV: 94.1 fL (ref 80.0–100.0)
MPV: 11 fL (ref 7.5–12.5)
Monocytes Relative: 6.9 %
Neutro Abs: 5595 cells/uL (ref 1500–7800)
Neutrophils Relative %: 78.8 %
Platelets: 234 10*3/uL (ref 140–400)
RBC: 4.38 10*6/uL (ref 3.80–5.10)
RDW: 13.6 % (ref 11.0–15.0)
Total Lymphocyte: 10.1 %
WBC: 7.1 10*3/uL (ref 3.8–10.8)

## 2020-04-23 LAB — COMPLETE METABOLIC PANEL WITHOUT GFR
AG Ratio: 1.7 (calc) (ref 1.0–2.5)
ALT: 24 U/L (ref 6–29)
AST: 24 U/L (ref 10–35)
Albumin: 3.7 g/dL (ref 3.6–5.1)
Alkaline phosphatase (APISO): 93 U/L (ref 37–153)
BUN: 12 mg/dL (ref 7–25)
CO2: 30 mmol/L (ref 20–32)
Calcium: 9.2 mg/dL (ref 8.6–10.4)
Chloride: 103 mmol/L (ref 98–110)
Creat: 0.9 mg/dL (ref 0.60–0.93)
GFR, Est African American: 75 mL/min/1.73m2
GFR, Est Non African American: 65 mL/min/1.73m2
Globulin: 2.2 g/dL (ref 1.9–3.7)
Glucose, Bld: 81 mg/dL (ref 65–99)
Potassium: 3.9 mmol/L (ref 3.5–5.3)
Sodium: 140 mmol/L (ref 135–146)
Total Bilirubin: 0.6 mg/dL (ref 0.2–1.2)
Total Protein: 5.9 g/dL — ABNORMAL LOW (ref 6.1–8.1)

## 2020-04-24 NOTE — Progress Notes (Signed)
Lymphocyte count is low due to methotrexate use.  All other labs unremarkable.  We will continue to monitor labs.

## 2020-04-25 ENCOUNTER — Other Ambulatory Visit: Payer: Self-pay | Admitting: Rheumatology

## 2020-04-25 NOTE — Telephone Encounter (Signed)
Next Visit: 06/16/2020  Last Visit: 01/14/2020  Last Fill: 02/15/2020  DX: Polymyalgia rheumatica  Current Dose per office note 01/14/2020: Prednisone 5 mg 1 tablet by mouth daily  Okay to refill Prednisone?

## 2020-05-03 ENCOUNTER — Telehealth: Payer: Self-pay | Admitting: Family Medicine

## 2020-05-03 NOTE — Telephone Encounter (Signed)
Left message for patient to call back and schedule Medicare Annual Wellness Visit (AWV) either virtually OR in office.   No hx; please schedule at anytime with [facility]Nurse Health Advisor at [facility].  This should be a 45 minute visit.   

## 2020-05-17 ENCOUNTER — Encounter: Payer: Self-pay | Admitting: Rheumatology

## 2020-05-17 ENCOUNTER — Other Ambulatory Visit: Payer: Self-pay | Admitting: Family Medicine

## 2020-05-18 NOTE — Telephone Encounter (Signed)
I returned patient's call.  She stated that she does not want to come off prednisone.  She has tried tapering prednisone before but she had flare each time.  She will stay on prednisone 5 mg p.o. daily.  She understands the side effects of long-term prednisone use.  She does not see any role of methotrexate.  She wants to discontinue methotrexate.  I advised her to discontinue methotrexate and folic acid.

## 2020-05-31 NOTE — Progress Notes (Deleted)
Office Visit Note  Patient: [name]             Date of Birth: 02-Feb-1951           MRN: [id number]             PCP: [name], MD Referring: [name], MD Visit Date: 06/01/2020 Occupation: @GUAROCC @  Subjective:    History of Present Illness: [name] is a 70 y.o. female with history of PMR, osteoporosis, and osteoarthritis.  She is taking methotrexate 4 tablets by mouth once weekly, folic acid 1 mg by mouth daily, prednisone 5 mg 1 tablet by mouth daily.  She remains on Forteo daily subcutaneous injections for management of osteoporosis.  CBC and CMP were drawn on 04/22/2020.  Her next lab work will be due in June and every 3 months to monitor for toxicity.  Standing orders for CBC and CMP remain in place.  Activities of Daily Living:  Patient reports morning stiffness for *** {minute/hour:19697}.   Patient {ACTIONS;DENIES/REPORTS:21021675::"Denies"} nocturnal pain.  Difficulty dressing/grooming: {ACTIONS;DENIES/REPORTS:21021675::"Denies"} Difficulty climbing stairs: {ACTIONS;DENIES/REPORTS:21021675::"Denies"} Difficulty getting out of chair: {ACTIONS;DENIES/REPORTS:21021675::"Denies"} Difficulty using hands for taps, buttons, cutlery, and/or writing: {ACTIONS;DENIES/REPORTS:21021675::"Denies"}  No Rheumatology ROS completed.   PMFS History:  Patient Active Problem List   Diagnosis Date Noted  . Lumbar compression fracture ([place]) 05/11/2019  . Educated about COVID-19 virus infection 01/26/2019  . Dyslipidemia 01/26/2019  . Drug-induced myopathy 01/14/2019  . Haglund's deformity 01/12/2019  . Myalgia 12/30/2018  . High risk medication use 12/04/2018  . Recurrent major depressive disorder, in full remission ([place]) 09/03/2018  . Lower esophageal ring ([place]) 03/03/2018  . Osteoarthritis of right hip 12/02/2017  . GERD (gastroesophageal reflux disease) 12/15/2015  . Polyp of colon, adenomatous 10/11/2014  . Vitamin D deficiency 05/26/2014  .  Osteoporosis 05/26/2014  . Polymyalgia rheumatica ([place]) 12/01/2010  . CAD (coronary artery disease) 09/09/2007  . Hematuria 09/09/2007  . Depression 03/20/2007  . Hypothyroid 02/13/1999    Past Medical History:  Diagnosis Date  . Anxiety   . Arthritis   . CAD (coronary artery disease)   . Cataract   . Depression   . DISORDER, MENOPAUSAL NOS 03/20/2007   Qualifier: Diagnosis of  By: Arnoldo Morale MD, Balinda Quails   . GERD (gastroesophageal reflux disease)   . Hyperlipidemia   . Hypothyroidism   . MYOCARDIAL INFARCTION, HX OF 09/09/2007   Qualifier: Diagnosis of  By: Leanne Chang MD, Bruce    . Osteoporosis   . Polymyalgia rheumatica ([facility])     Family History  Problem Relation Age of Onset  . Hypertension Father   . Heart disease Father        Mi age 28, 69, 57 and 5.  . Cancer Father   . Hyperlipidemia Father   . Colon cancer Maternal Uncle   . Hypertension Mother   . Cancer Mother   . Hyperlipidemia Mother   . Pulmonary fibrosis Sister   . Epilepsy Son   . Healthy Son   . Myasthenia gravis Son   . Healthy Son   . Rheum arthritis Niece   . Lupus Niece   . Rheum arthritis Niece    Past Surgical History:  Procedure Laterality Date  . CATARACT EXTRACTION    . CESAREAN SECTION    . CHOLECYSTECTOMY N/A 11/24/2012   Procedure: LAPAROSCOPIC CHOLECYSTECTOMY;  Surgeon: Ralene Ok, MD;  Location: [facility];  Service: General;  Laterality: N/A;  . COSMETIC SURGERY    . EYE SURGERY  eye lids lifted  . PTCA     Social History   Social History Narrative   Husband and 2 adult children live at home. No grandkids. Son with epilepsy.       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 No part time.    Immunization History  Administered Date(s) Administered  . Fluad Quad(high Dose 65+) 11/10/2019  . Influenza Whole 11/09/2008  . Influenza, High Dose Seasonal PF 10/30/2016, 12/02/2017, 11/08/2018  . Influenza-Unspecified 11/08/2018  . PFIZER(Purple Top)SARS-COV-2 Vaccination 02/26/2019,  03/20/2019  . Pneumococcal Conjugate-13 10/11/2014  . Pneumococcal Polysaccharide-23 12/15/2015  . Td 02/12/2006  . Tdap 04/26/2017  . Zoster 09/19/2010     Objective: Vital Signs: There were no vitals taken for this visit.   Physical Exam Vitals and nursing note reviewed.  Constitutional:      Appearance: She is well-developed.  HENT:     Head: Normocephalic and atraumatic.  Eyes:     Conjunctiva/sclera: Conjunctivae normal.  Pulmonary:     Effort: Pulmonary effort is normal.  Abdominal:     Palpations: Abdomen is soft.  Musculoskeletal:     Cervical back: Normal range of motion.  Skin:    General: Skin is warm and dry.     Capillary Refill: Capillary refill takes less than 2 seconds.  Neurological:     Mental Status: She is alert and oriented to person, place, and time.  Psychiatric:        Behavior: Behavior normal.      Musculoskeletal Exam: ***  CDAI Exam: CDAI Score: -- Patient Global: --; Provider Global: -- Swollen: --; Tender: -- Joint Exam 06/01/2020   No joint exam has been documented for this visit   There is currently no information documented on the homunculus. Go to the Rheumatology activity and complete the homunculus joint exam.  Investigation: No additional findings.  Imaging: No results found.  Recent Labs: Lab Results  Component Value Date   WBC 7.1 04/22/2020   HGB 13.5 04/22/2020   PLT 234 04/22/2020   NA 140 04/22/2020   K 3.9 04/22/2020   CL 103 04/22/2020   CO2 30 04/22/2020   GLUCOSE 81 04/22/2020   BUN 12 04/22/2020   CREATININE 0.90 04/22/2020   BILITOT 0.6 04/22/2020   ALKPHOS 102 01/01/2019   AST 24 04/22/2020   ALT 24 04/22/2020   PROT 5.9 (L) 04/22/2020   ALBUMIN 3.7 01/01/2019   CALCIUM 9.2 04/22/2020   GFRAA 75 04/22/2020   QFTBGOLDPLUS NEGATIVE 08/12/2018    Speciality Comments: No specialty comments available.  Procedures:  No procedures performed Allergies: Morphine and related, Prochlorperazine,  Simvastatin, and Sulfa antibiotics   Assessment / Plan:     Visit Diagnoses: Polymyalgia rheumatica ([place])  High risk medication use  Long term (current) use of systemic steroids  Age-related osteoporosis without current pathological fracture  Vitamin D deficiency  Trochanteric bursitis, right hip  Primary osteoarthritis of right hip  Primary osteoarthritis of both hands  Primary osteoarthritis of both feet  DDD (degenerative disc disease), lumbar  Adenomatous polyp of colon, unspecified part of colon  Coronary artery disease involving native coronary artery of native heart without angina pectoris  History of gastroesophageal reflux (GERD)  History of depression  History of hypothyroidism  Abnormal SPEP  Lower esophageal ring ([place])  Orders: No orders of the defined types were placed in this encounter.  No orders of the defined types were placed in this encounter.   Face-to-face time spent with patient was ***  minutes. Greater than 50% of time was spent in counseling and coordination of care.  Follow-Up Instructions: No follow-ups on file.   Ofilia Neas, PA-C  Note - This record has been created using Dragon software.  Chart creation errors have been sought, but may not always  have been located. Such creation errors do not reflect on  the standard of medical care.

## 2020-06-01 ENCOUNTER — Ambulatory Visit: Payer: PPO | Admitting: Physician Assistant

## 2020-06-01 DIAGNOSIS — K222 Esophageal obstruction: Secondary | ICD-10-CM

## 2020-06-01 DIAGNOSIS — M810 Age-related osteoporosis without current pathological fracture: Secondary | ICD-10-CM

## 2020-06-01 DIAGNOSIS — M1611 Unilateral primary osteoarthritis, right hip: Secondary | ICD-10-CM

## 2020-06-01 DIAGNOSIS — D126 Benign neoplasm of colon, unspecified: Secondary | ICD-10-CM

## 2020-06-01 DIAGNOSIS — M7061 Trochanteric bursitis, right hip: Secondary | ICD-10-CM

## 2020-06-01 DIAGNOSIS — Z7952 Long term (current) use of systemic steroids: Secondary | ICD-10-CM

## 2020-06-01 DIAGNOSIS — M19071 Primary osteoarthritis, right ankle and foot: Secondary | ICD-10-CM

## 2020-06-01 DIAGNOSIS — Z8719 Personal history of other diseases of the digestive system: Secondary | ICD-10-CM

## 2020-06-01 DIAGNOSIS — M353 Polymyalgia rheumatica: Secondary | ICD-10-CM

## 2020-06-01 DIAGNOSIS — Z8639 Personal history of other endocrine, nutritional and metabolic disease: Secondary | ICD-10-CM

## 2020-06-01 DIAGNOSIS — Z79899 Other long term (current) drug therapy: Secondary | ICD-10-CM

## 2020-06-01 DIAGNOSIS — I2510 Atherosclerotic heart disease of native coronary artery without angina pectoris: Secondary | ICD-10-CM

## 2020-06-01 DIAGNOSIS — M19042 Primary osteoarthritis, left hand: Secondary | ICD-10-CM

## 2020-06-01 DIAGNOSIS — Z8659 Personal history of other mental and behavioral disorders: Secondary | ICD-10-CM

## 2020-06-01 DIAGNOSIS — M5136 Other intervertebral disc degeneration, lumbar region: Secondary | ICD-10-CM

## 2020-06-01 DIAGNOSIS — R778 Other specified abnormalities of plasma proteins: Secondary | ICD-10-CM

## 2020-06-01 DIAGNOSIS — E559 Vitamin D deficiency, unspecified: Secondary | ICD-10-CM

## 2020-06-16 ENCOUNTER — Ambulatory Visit: Payer: PPO | Admitting: Physician Assistant

## 2020-06-16 NOTE — Progress Notes (Addendum)
Office Visit Note  Patient: [name]             Date of Birth: 23-Mar-1950           MRN: [id number]             PCP: [name], MD Referring: [name], MD Visit Date: 06/17/2020 Occupation: @GUAROCC @  Subjective:  Low back pain   History of Present Illness: GENETTE HUERTAS is a 70 y.o. female with history of polymyalgia rheumatica, osteoporosis, and DDD. She remains on prednisone 5 mg 1 tablet by mouth daily and has been unable to taper.  She discontinued methotrexate in April 2022 due to not noticing any clinically benefit while taking it.  She has not noticed any new or worsening symptoms since discontinuing MTX.  She denies any signs or symptoms of a PMR flare.  She states she continues to have significant lumbar spine pain.  She is followed by Dr. Lorin Mercy and Dr. Ernestina Patches.  She had a lumbar epidural injection on 03/29/20 which only provided relief for 2-3 weeks.  She has been taking acetaminophen and ibuprofen 3-4 times daily for pain relief.  She denies any radiating pain.  She states she has not had any falls, fractures, or injuries recently.  She discontinued forteo about 3 months ago due to running out of the prescription. She has been taking vitamin D on a daily basis.    Activities of Daily Living:  Patient reports morning stiffness for 30 minutes.   Patient Reports nocturnal pain.  Difficulty dressing/grooming: Denies Difficulty climbing stairs: Denies Difficulty getting out of chair: Denies Difficulty using hands for taps, buttons, cutlery, and/or writing: Denies  Review of Systems  Constitutional: Negative for fatigue.  HENT: Negative for mouth sores, mouth dryness and nose dryness.   Eyes: Negative for pain, visual disturbance and dryness.  Respiratory: Negative for cough, hemoptysis, shortness of breath and difficulty breathing.   Cardiovascular: Negative for chest pain, palpitations, hypertension and swelling in legs/feet.  Gastrointestinal: Negative  for blood in stool, constipation and diarrhea.  Endocrine: Negative for increased urination.  Genitourinary: Negative for painful urination.  Musculoskeletal: Positive for arthralgias, joint pain, myalgias, morning stiffness and myalgias. Negative for joint swelling, muscle weakness and muscle tenderness.  Skin: Negative for color change, pallor, rash, hair loss, nodules/bumps, skin tightness, ulcers and sensitivity to sunlight.  Allergic/Immunologic: Negative for susceptible to infections.  Neurological: Negative for dizziness, numbness, headaches and weakness.  Hematological: Negative for swollen glands.  Psychiatric/Behavioral: Positive for sleep disturbance. Negative for depressed mood. The patient is not nervous/anxious.     PMFS History:  Patient Active Problem List   Diagnosis Date Noted  . Lumbar compression fracture ([place]) 05/11/2019  . Educated about COVID-19 virus infection 01/26/2019  . Dyslipidemia 01/26/2019  . Drug-induced myopathy 01/14/2019  . Haglund's deformity 01/12/2019  . Myalgia 12/30/2018  . High risk medication use 12/04/2018  . Recurrent major depressive disorder, in full remission ([place]) 09/03/2018  . Lower esophageal ring ([place]) 03/03/2018  . Osteoarthritis of right hip 12/02/2017  . GERD (gastroesophageal reflux disease) 12/15/2015  . Polyp of colon, adenomatous 10/11/2014  . Vitamin D deficiency 05/26/2014  . Osteoporosis 05/26/2014  . Polymyalgia rheumatica ([place]) 12/01/2010  . CAD (coronary artery disease) 09/09/2007  . Hematuria 09/09/2007  . Depression 03/20/2007  . Hypothyroid 02/13/1999    Past Medical History:  Diagnosis Date  . Anxiety   . Arthritis   . CAD (coronary artery disease)   .  Cataract   . Depression   . DISORDER, MENOPAUSAL NOS 03/20/2007   Qualifier: Diagnosis of  By: Arnoldo Morale MD, Balinda Quails   . GERD (gastroesophageal reflux disease)   . Hyperlipidemia   . Hypothyroidism   . MYOCARDIAL INFARCTION, HX OF 09/09/2007   Qualifier:  Diagnosis of  By: Leanne Chang MD, Bruce    . Osteoporosis   . Polymyalgia rheumatica ([facility])     Family History  Problem Relation Age of Onset  . Hypertension Father   . Heart disease Father        Mi age 55, 55, 75 and 9.  . Cancer Father   . Hyperlipidemia Father   . Colon cancer Maternal Uncle   . Hypertension Mother   . Cancer Mother   . Hyperlipidemia Mother   . Pulmonary fibrosis Sister   . Epilepsy Son   . Healthy Son   . Myasthenia gravis Son   . Healthy Son   . Rheum arthritis Niece   . Lupus Niece   . Rheum arthritis Niece    Past Surgical History:  Procedure Laterality Date  . CATARACT EXTRACTION    . CESAREAN SECTION    . CHOLECYSTECTOMY N/A 11/24/2012   Procedure: LAPAROSCOPIC CHOLECYSTECTOMY;  Surgeon: Ralene Ok, MD;  Location: [facility];  Service: General;  Laterality: N/A;  . COSMETIC SURGERY    . EYE SURGERY     eye lids lifted  . PTCA     Social History   Social History Narrative   Husband and 2 adult children live at home. No grandkids. Son with epilepsy.       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 No part time.    Immunization History  Administered Date(s) Administered  . Fluad Quad(high Dose 65+) 11/10/2019  . Influenza Whole 11/09/2008  . Influenza, High Dose Seasonal PF 10/30/2016, 12/02/2017, 11/08/2018  . Influenza-Unspecified 11/08/2018  . PFIZER(Purple Top)SARS-COV-2 Vaccination 02/26/2019, 03/20/2019, 11/28/2019  . Pneumococcal Conjugate-13 10/11/2014  . Pneumococcal Polysaccharide-23 12/15/2015  . Td 02/12/2006  . Tdap 04/26/2017  . Zoster 09/19/2010     Objective: Vital Signs: BP (!) 150/90 (BP Location: Left Arm, Patient Position: Sitting, Cuff Size: Normal)   Pulse 76   Resp 13   Ht 5' 1.25" (1.556 m)   Wt 163 lb (73.9 kg)   BMI 30.55 kg/m    Physical Exam Vitals and nursing note reviewed.  Constitutional:      Appearance: She is well-developed.  HENT:     Head: Normocephalic and atraumatic.  Eyes:      Conjunctiva/sclera: Conjunctivae normal.  Pulmonary:     Effort: Pulmonary effort is normal.  Abdominal:     Palpations: Abdomen is soft.  Musculoskeletal:     Cervical back: Normal range of motion.  Skin:    General: Skin is warm and dry.     Capillary Refill: Capillary refill takes less than 2 seconds.  Neurological:     Mental Status: She is alert and oriented to person, place, and time.  Psychiatric:        Behavior: Behavior normal.      Musculoskeletal Exam: C-spine good ROM.  Painful ROM of lumbar spine.  Midline spinal tenderness in the lumbar region.  No SI joint tenderness.  Shoulder joints, elbow joints, wrist joints, MCP, PIPs, and DIPs good ROM with no synovitis.  Complete fist formation bilaterally.  PIP and DIP thickening consistent with osteoarthritis of both hands. Hip joints limited ROM with no discomfort.  Knee joints good  ROM with no warmth or effusion.  Ankle joints good ROM with no tenderness or joint swelling.   CDAI Exam: CDAI Score: -- Patient Global: --; Provider Global: -- Swollen: --; Tender: -- Joint Exam 06/17/2020   No joint exam has been documented for this visit   There is currently no information documented on the homunculus. Go to the Rheumatology activity and complete the homunculus joint exam.  Investigation: No additional findings.  Imaging: No results found.  Recent Labs: Lab Results  Component Value Date   WBC 7.1 04/22/2020   HGB 13.5 04/22/2020   PLT 234 04/22/2020   NA 140 04/22/2020   K 3.9 04/22/2020   CL 103 04/22/2020   CO2 30 04/22/2020   GLUCOSE 81 04/22/2020   BUN 12 04/22/2020   CREATININE 0.90 04/22/2020   BILITOT 0.6 04/22/2020   ALKPHOS 102 01/01/2019   AST 24 04/22/2020   ALT 24 04/22/2020   PROT 5.9 (L) 04/22/2020   ALBUMIN 3.7 01/01/2019   CALCIUM 9.2 04/22/2020   GFRAA 75 04/22/2020   QFTBGOLDPLUS NEGATIVE 08/12/2018    Speciality Comments: No specialty comments available.  Procedures:  No  procedures performed Allergies: Morphine and related, Prochlorperazine, Simvastatin, and Sulfa antibiotics   Assessment / Plan:     Visit Diagnoses: Polymyalgia rheumatica ([place]) - Diagnosed 7 years ago by her previous rheumatologist: She has not had any signs or symptoms of a polymyalgia rheumatica flare.  She is not experiencing any myalgias, arthralgias, or joint stiffness at this time.  She has no difficulty raising her arms above her head or rising from a seated position.  She is currently taking prednisone 5 mg 1 tablet by mouth daily and is tolerating it without any side effects.  She discontinued methotrexate in April 2022 due to not noticing any clinical benefit.  She has not noticed any recurrence of her symptoms since discontinuing methotrexate.  She is aware of the long-term risks of prednisone use.  She will remain on prednisone 5 mg 1 tablet by mouth daily.  She does not need a refill at this time.  She was advised to notify us if she develops signs or symptoms of a flare.  She will follow-up in the office in 5 months.  High risk medication use - Patient discontinued methotrexate in April 2022 due to not noticing any clinical benefit.    Long term (current) use of systemic steroids - She continues to take Prednisone 5 mg 1 tablet by mouth daily. Unable to taper at this time.  Discussed the long term risks of prednisone use.  Advised to monitor blood pressure, blood glucose, and lipid panel closely.  We will continue to monitor DEXA every 2 years.   Age-related osteoporosis without current pathological fracture - Forteo 620 mg per 2.48 mL subcu daily. DEXA updated on June 08, 2019 T score -2.7 in the lumbar region.  She was on Forteo sq daily injections for about 1 year but discontinued about 3 months ago due to not having a refill/renewing her application for [id number].  She has been taking vitamin D on a daily basis.  She has not had any recent falls or fractures.  We will reapply for Forteo  daily injections and notify her once approved.  She will require an updated DEXA in April 2023.   History of compression fracture of spine: Chronic L1 compression fracture with loss of approximately 60% of vertebral body height noted on MRI from 04/22/20. Discussed the importance of remaining compliant with  the use of forteo.  We will reapply for forteo.   DDD (degenerative disc disease), lumbar: MRI of the lumbar spine obtained on 04/23/19: multilevel degenerative changes of lumbar spine, worst at L4-L5 with mild spinal canal stenosis, narrowing of left subarticular zone, mild right and moderate left neural foraminal narrowing.   MRI results reviewed today in the office. She continues to have persistent lower back pain. She has midline spinal tenderness in the lumbar region.  No symptoms of radiculopathy at this time.  She had an epidural injection performed by Dr. Ernestina Patches on 03/29/20 which provided relief for 2-3 weeks.  Advised to schedule a follow up visit with Dr. Lorin Mercy and Dr. Ernestina Patches.   Offered a referral to physical therapy or to a neurosurgeon but she declined at this time.  Advised to avoid NSAID use while taking prednisone.   Vitamin D deficiency: She is taking a vitamin D supplement daily.    Trochanteric bursitis, right hip: She has no tenderness to palpation at this time.   Primary osteoarthritis of right hip: She has limited ROM of the right hip joint with no discomfort.  No groin pain currently.    Primary osteoarthritis of both hands: She has PIP and DIP thickening consistent with osteoarthritis of both hands.  No joint tenderness or inflammation was noted.  She was able to make a complete fist bilaterally.  Joint protection and muscle strengthening were discussed.  Primary osteoarthritis of both feet: She is not experiencing any discomfort in her feet at this time.  She wears proper fitting shoes.  DDD (degenerative disc disease), lumbar - Dr. Lorin Mercy. MRI of the lumbar spine was  obtained on 04/23/2019.  Other medical conditions are listed as follows:   Adenomatous polyp of colon, unspecified part of colon  Coronary artery disease involving native coronary artery of native heart without angina pectoris  History of gastroesophageal reflux (GERD)  History of hypothyroidism  History of depression  Orders: No orders of the defined types were placed in this encounter.  No orders of the defined types were placed in this encounter.     Follow-Up Instructions: Return in about 5 months (around 11/17/2020).   Ofilia Neas, PA-C  Note - This record has been created using Dragon software.  Chart creation errors have been sought, but may not always  have been located. Such creation errors do not reflect on  the standard of medical care.

## 2020-06-17 ENCOUNTER — Other Ambulatory Visit (HOSPITAL_COMMUNITY): Payer: Self-pay

## 2020-06-17 ENCOUNTER — Telehealth: Payer: Self-pay

## 2020-06-17 ENCOUNTER — Encounter: Payer: Self-pay | Admitting: Orthopaedic Surgery

## 2020-06-17 ENCOUNTER — Ambulatory Visit: Payer: PPO | Admitting: Physician Assistant

## 2020-06-17 ENCOUNTER — Other Ambulatory Visit: Payer: Self-pay

## 2020-06-17 ENCOUNTER — Encounter: Payer: Self-pay | Admitting: Physician Assistant

## 2020-06-17 VITALS — BP 150/90 | HR 76 | Resp 13 | Ht 61.25 in | Wt 163.0 lb

## 2020-06-17 DIAGNOSIS — M7061 Trochanteric bursitis, right hip: Secondary | ICD-10-CM | POA: Diagnosis not present

## 2020-06-17 DIAGNOSIS — M19041 Primary osteoarthritis, right hand: Secondary | ICD-10-CM | POA: Diagnosis not present

## 2020-06-17 DIAGNOSIS — M5136 Other intervertebral disc degeneration, lumbar region: Secondary | ICD-10-CM

## 2020-06-17 DIAGNOSIS — M353 Polymyalgia rheumatica: Secondary | ICD-10-CM

## 2020-06-17 DIAGNOSIS — Z7952 Long term (current) use of systemic steroids: Secondary | ICD-10-CM

## 2020-06-17 DIAGNOSIS — Z79899 Other long term (current) drug therapy: Secondary | ICD-10-CM | POA: Diagnosis not present

## 2020-06-17 DIAGNOSIS — I2510 Atherosclerotic heart disease of native coronary artery without angina pectoris: Secondary | ICD-10-CM | POA: Diagnosis not present

## 2020-06-17 DIAGNOSIS — M19071 Primary osteoarthritis, right ankle and foot: Secondary | ICD-10-CM

## 2020-06-17 DIAGNOSIS — Z8781 Personal history of (healed) traumatic fracture: Secondary | ICD-10-CM

## 2020-06-17 DIAGNOSIS — M1611 Unilateral primary osteoarthritis, right hip: Secondary | ICD-10-CM | POA: Diagnosis not present

## 2020-06-17 DIAGNOSIS — Z8719 Personal history of other diseases of the digestive system: Secondary | ICD-10-CM

## 2020-06-17 DIAGNOSIS — Z8659 Personal history of other mental and behavioral disorders: Secondary | ICD-10-CM

## 2020-06-17 DIAGNOSIS — D126 Benign neoplasm of colon, unspecified: Secondary | ICD-10-CM

## 2020-06-17 DIAGNOSIS — M19042 Primary osteoarthritis, left hand: Secondary | ICD-10-CM

## 2020-06-17 DIAGNOSIS — M810 Age-related osteoporosis without current pathological fracture: Secondary | ICD-10-CM

## 2020-06-17 DIAGNOSIS — Z8639 Personal history of other endocrine, nutritional and metabolic disease: Secondary | ICD-10-CM

## 2020-06-17 DIAGNOSIS — E559 Vitamin D deficiency, unspecified: Secondary | ICD-10-CM | POA: Diagnosis not present

## 2020-06-17 DIAGNOSIS — M19072 Primary osteoarthritis, left ankle and foot: Secondary | ICD-10-CM

## 2020-06-17 NOTE — Telephone Encounter (Signed)
Please re-apply for forteo, per Hazel Sams, PA-C. Patient discontinued approximately 3 months ago. Thanks!

## 2020-06-17 NOTE — Telephone Encounter (Signed)
PA still valid for Forteo. Patient's copay is $251.90 for 1 month supply. Patient will need to reapply for [facility] Patient Assistance. Office mailed application.  Called patient and discussed. She will stop by the office to pick up the application.

## 2020-06-17 NOTE — Progress Notes (Deleted)
Duplicate

## 2020-06-17 NOTE — Patient Instructions (Addendum)
Please stop by lab before you go If you have mychart- we will send your results within 3 business days of [place] receiving them.  If you do not have mychart- we will call you about results within 5 business days of [place] receiving them.  *please also note that you will see labs on mychart as soon as they post. I will later go in and write notes on them- will say "notes from Dr. Yong Channel"  Health Maintenance Due  Topic Date Due  . URINE MICROALBUMIN Maybe done in office.  Never done  . COLONOSCOPY (Pts 45-56yrs Insurance coverage will need to be confirmed) Patient doesn't want this. Please discuss.  07/23/2019   Please check with your pharmacy to see if they have the shingrix vaccine. If they do- please get this immunization and update [place] by phone call or mychart with dates you receive the vaccine  Prevnar 20 today- hopefully final pneumonia shot unless they come out with a new one  We will call you within two weeks about your referral to GI. If you do not hear within 2 weeks, please give their office a call [facility] [place] contact Address: [address], [place], [place] [address] Phone: [phone]  We will call you within two weeks about your referral for CT to follow up on enlarged lymph nodes from several years ago. If you do not hear within 2 weeks, give [place] a call.  -team please check with lisa to make sure we dont need to change location on this  Recommended follow up: No follow-ups on file. At latest 1 year for physical

## 2020-06-20 ENCOUNTER — Other Ambulatory Visit: Payer: Self-pay

## 2020-06-20 DIAGNOSIS — M545 Low back pain, unspecified: Secondary | ICD-10-CM

## 2020-06-20 DIAGNOSIS — G8929 Other chronic pain: Secondary | ICD-10-CM

## 2020-06-21 ENCOUNTER — Other Ambulatory Visit: Payer: Self-pay

## 2020-06-21 ENCOUNTER — Encounter: Payer: Self-pay | Admitting: Family Medicine

## 2020-06-21 ENCOUNTER — Ambulatory Visit (INDEPENDENT_AMBULATORY_CARE_PROVIDER_SITE_OTHER): Payer: PPO | Admitting: Family Medicine

## 2020-06-21 VITALS — BP 138/86 | HR 70 | Temp 98.5°F | Ht 61.0 in | Wt 160.6 lb

## 2020-06-21 DIAGNOSIS — E559 Vitamin D deficiency, unspecified: Secondary | ICD-10-CM

## 2020-06-21 DIAGNOSIS — I2510 Atherosclerotic heart disease of native coronary artery without angina pectoris: Secondary | ICD-10-CM

## 2020-06-21 DIAGNOSIS — R599 Enlarged lymph nodes, unspecified: Secondary | ICD-10-CM | POA: Diagnosis not present

## 2020-06-21 DIAGNOSIS — R739 Hyperglycemia, unspecified: Secondary | ICD-10-CM | POA: Diagnosis not present

## 2020-06-21 DIAGNOSIS — R590 Localized enlarged lymph nodes: Secondary | ICD-10-CM

## 2020-06-21 DIAGNOSIS — E538 Deficiency of other specified B group vitamins: Secondary | ICD-10-CM | POA: Diagnosis not present

## 2020-06-21 DIAGNOSIS — E034 Atrophy of thyroid (acquired): Secondary | ICD-10-CM

## 2020-06-21 DIAGNOSIS — F3342 Major depressive disorder, recurrent, in full remission: Secondary | ICD-10-CM

## 2020-06-21 DIAGNOSIS — Z23 Encounter for immunization: Secondary | ICD-10-CM

## 2020-06-21 DIAGNOSIS — E785 Hyperlipidemia, unspecified: Secondary | ICD-10-CM | POA: Diagnosis not present

## 2020-06-21 DIAGNOSIS — M791 Myalgia, unspecified site: Secondary | ICD-10-CM

## 2020-06-21 DIAGNOSIS — Z0000 Encounter for general adult medical examination without abnormal findings: Secondary | ICD-10-CM | POA: Diagnosis not present

## 2020-06-21 DIAGNOSIS — Z1211 Encounter for screening for malignant neoplasm of colon: Secondary | ICD-10-CM

## 2020-06-21 NOTE — Addendum Note (Signed)
Addended by: Linton Ham on: 06/21/2020 04:44 PM   Modules accepted: Orders

## 2020-06-21 NOTE — Telephone Encounter (Signed)
Faxed patient assistance application to [employer] - signed provider portion, insurance card copy, med list, and prior Academic librarian.  Fax: [phone] Phone: [phone]
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
## 2020-06-22 LAB — COMPREHENSIVE METABOLIC PANEL
ALT: 13 U/L (ref 0–35)
AST: 18 U/L (ref 0–37)
Albumin: 4.1 g/dL (ref 3.5–5.2)
Alkaline Phosphatase: 90 U/L (ref 39–117)
BUN: 15 mg/dL (ref 6–23)
CO2: 27 mEq/L (ref 19–32)
Calcium: 9.3 mg/dL (ref 8.4–10.5)
Chloride: 104 mEq/L (ref 96–112)
Creatinine, Ser: 0.95 mg/dL (ref 0.40–1.20)
GFR: 60.57 mL/min (ref >60.00)
Glucose, Bld: 89 mg/dL (ref 70–99)
Potassium: 4 mEq/L (ref 3.5–5.1)
Sodium: 140 mEq/L (ref 135–145)
Total Bilirubin: 0.6 mg/dL (ref 0.2–1.2)
Total Protein: 6.4 g/dL (ref 6.0–8.3)

## 2020-06-22 LAB — TSH: TSH: 3.72 u[IU]/mL (ref 0.35–4.50)

## 2020-06-22 LAB — CBC WITH DIFFERENTIAL/PLATELET
Basophils Absolute: 0.1 10*3/uL (ref 0.0–0.1)
Basophils Relative: 0.6 % (ref 0.0–3.0)
Eosinophils Absolute: 0.1 10*3/uL (ref 0.0–0.7)
Eosinophils Relative: 0.8 % (ref 0.0–5.0)
HCT: 40.4 % (ref 36.0–46.0)
Hemoglobin: 13.4 g/dL (ref 12.0–15.0)
Lymphocytes Relative: 10.1 % — ABNORMAL LOW (ref 12.0–46.0)
Lymphs Abs: 0.9 10*3/uL (ref 0.7–4.0)
MCHC: 33.1 g/dL (ref 30.0–36.0)
MCV: 93.9 fl (ref 78.0–100.0)
Monocytes Absolute: 0.5 10*3/uL (ref 0.1–1.0)
Monocytes Relative: 5.1 % (ref 3.0–12.0)
Neutro Abs: 7.7 10*3/uL (ref 1.4–7.7)
Neutrophils Relative %: 83.4 % — ABNORMAL HIGH (ref 43.0–77.0)
Platelets: 235 10*3/uL (ref 150.0–400.0)
RBC: 4.3 Mil/uL (ref 3.87–5.11)
RDW: 14.3 % (ref 11.5–15.5)
WBC: 9.2 10*3/uL (ref 4.0–10.5)

## 2020-06-22 LAB — LIPID PANEL
Cholesterol: 187 mg/dL (ref 0–200)
HDL: 69.9 mg/dL (ref >39.00)
LDL Cholesterol: 105 mg/dL — ABNORMAL HIGH (ref 0–99)
NonHDL: 116.72
Total CHOL/HDL Ratio: 3
Triglycerides: 58 mg/dL (ref 0.0–149.0)
VLDL: 11.6 mg/dL (ref 0.0–40.0)

## 2020-06-22 LAB — VITAMIN B12: Vitamin B-12: 298 pg/mL (ref 211–911)

## 2020-06-22 LAB — VITAMIN D 25 HYDROXY (VIT D DEFICIENCY, FRACTURES): VITD: 26.67 ng/mL — ABNORMAL LOW (ref 30.00–100.00)

## 2020-06-22 LAB — HEMOGLOBIN A1C: Hgb A1c MFr Bld: 6 % (ref 4.6–6.5)

## 2020-06-23 ENCOUNTER — Other Ambulatory Visit: Payer: Self-pay | Admitting: Family Medicine

## 2020-06-23 MED ORDER — VITAMIN D (ERGOCALCIFEROL) 1.25 MG (50000 UNIT) PO CAPS: 50000.0000 [IU] | ORAL_CAPSULE | ORAL | 0 refills | Status: DC

## 2020-07-01 NOTE — Telephone Encounter (Signed)
Called [employer] for Forteo application update. Prescription page is missing strength. Will re-submit this Monday, 07/04/20.  Application is also missing patient form. ATC patients twice but someone picked up phone and then hung up. MyChart message sent to patient  Phone: [phone]  Knox Saliva, PharmD, MPH Clinical Pharmacist (Rheumatology and Pulmonology)

## 2020-07-04 NOTE — Telephone Encounter (Signed)
Submitted Patient Assistance Application to [facility] for [place] along with patient portion, provider portion, PA, and income documents. Will update patient when we receive a response.  Fax# [phone] Phone# [phone]  Knox Saliva, PharmD, MPH Clinical Pharmacist (Rheumatology and Pulmonology)

## 2020-07-04 NOTE — Telephone Encounter (Signed)
Received a fax from  [facility] regarding an approval for [place] patient assistance from 07/04/20 through 02/11/21.   Phone number: [phone] ([place] phone number)

## 2020-07-05 NOTE — Telephone Encounter (Signed)
Called patient to advise about Forteo approval. She states she already received a text message from [employer] regarding approval.  She states she has pen needles remaining at home. I discussed that [employer] no logner sends pen needles with Forteo and she should reach out to our clinic when she needs refill for her Forteo. She verbalized understanding. Nothing further needed  Knox Saliva, PharmD, MPH Clinical Pharmacist (Rheumatology and Pulmonology)

## 2020-07-12 ENCOUNTER — Ambulatory Visit: Payer: Self-pay

## 2020-07-12 ENCOUNTER — Other Ambulatory Visit: Payer: Self-pay

## 2020-07-12 ENCOUNTER — Encounter: Payer: Self-pay | Admitting: Physical Medicine and Rehabilitation

## 2020-07-12 ENCOUNTER — Ambulatory Visit (INDEPENDENT_AMBULATORY_CARE_PROVIDER_SITE_OTHER): Payer: PPO | Admitting: Physical Medicine and Rehabilitation

## 2020-07-12 VITALS — BP 130/78 | HR 74

## 2020-07-12 DIAGNOSIS — M5416 Radiculopathy, lumbar region: Secondary | ICD-10-CM | POA: Diagnosis not present

## 2020-07-12 MED ORDER — BETAMETHASONE SOD PHOS & ACET 6 (3-3) MG/ML IJ SUSP
12.0000 mg | Freq: Once | INTRAMUSCULAR | Status: AC
  Administered 2020-07-12: 12 mg

## 2020-07-12 NOTE — Progress Notes (Signed)
Pt state from her bra line down to her lower back. Pt state anything with movement makes the pain worse. Pt state pain meds and heating pads to help ease her pain. Pt has hx of inj on 03/29/20 pt state ir helped for three weeks.  Numeric Pain Rating Scale and Functional Assessment Average Pain 4   In the last MONTH (on 0-10 scale) has pain interfered with the following?  1. General activity like being  able to carry out your everyday physical activities such as walking, climbing stairs, carrying groceries, or moving a chair?  Rating(10)   +Driver, -BT, -Dye Allergies.

## 2020-07-12 NOTE — Patient Instructions (Signed)

## 2020-07-12 NOTE — Progress Notes (Signed)
[name] - 70 y.o. female MRN [id number]  Date of birth: 06-30-1950  Office Visit Note: Visit Date: 07/12/2020 PCP: [name], MD Referred by: [name], MD  Subjective: Chief Complaint  Patient presents with  . Middle Back - Pain  . Lower Back - Pain   HPI:  [name] is a 70 y.o. female who comes in today at the request of Dr. Rodell Perna for planned Left T12-L1 Lumbar Interlaminar epidural steroid injection with fluoroscopic guidance.  The patient has failed conservative care including home exercise, medications, time and activity modification.  This injection will be diagnostic and hopefully therapeutic.  Please see requesting physician notes for further details and justification. MRI reviewed with images and spine model.  MRI reviewed in the note below.  Diagnostically she did get relief from prior injection.  If this is short-lived she needs to talk to Dr. Lorin Mercy about further management of this upper lumbar area with chronic compression fracture with some retropulsion.  She may do well with regrouping with physical therapy for strengthening and manual treatment of some myofascial pain here as well.  She does have lower lumbar issues but really is having pain more in the low thoracic upper lumbar region.    ROS Otherwise per HPI.    Assessment & Plan: Visit Diagnoses:    ICD-10-CM   1. Lumbar radiculopathy  M54.16 XR C-ARM NO REPORT    Epidural Steroid injection    betamethasone acetate-betamethasone sodium phosphate (CELESTONE) injection 12 mg    Plan: No additional findings.   Meds & Orders:  Meds ordered this encounter  Medications  . betamethasone acetate-betamethasone sodium phosphate (CELESTONE) injection 12 mg    Orders Placed This Encounter  Procedures  . XR C-ARM NO REPORT  . Epidural Steroid injection    Follow-up: Return for visit to requesting physician as needed.   Procedures: No procedures performed  Lumbar Epidural Steroid  Injection - Interlaminar Approach with Fluoroscopic Guidance  Patient: [name]      Date of Birth: [birth year] MRN: [id number] PCP: [name], MD      Visit Date: 07/12/2020   Universal Protocol:     Consent Given By: the patient  Position: PRONE  Additional Comments: Vital signs were monitored before and after the procedure. Patient was prepped and draped in the usual sterile fashion. The correct patient, procedure, and site was verified.   Injection Procedure Details:   Procedure diagnoses: Lumbar radiculopathy [M54.16]   Meds Administered:  Meds ordered this encounter  Medications  . betamethasone acetate-betamethasone sodium phosphate (CELESTONE) injection 12 mg     Laterality: Left  Location/Site:  T12-L1  Needle: 3.5 in., 20 ga. Tuohy  Needle Placement: Paramedian epidural  Findings:   -Comments: Excellent flow of contrast into the epidural space.  Procedure Details: Using a paramedian approach from the side mentioned above, the region overlying the inferior lamina was localized under fluoroscopic visualization and the soft tissues overlying this structure were infiltrated with 4 ml. of 1% Lidocaine without Epinephrine. The Tuohy needle was inserted into the epidural space using a paramedian approach.   The epidural space was localized using loss of resistance along with counter oblique bi-planar fluoroscopic views.  After negative aspirate for air, blood, and CSF, a 2 ml. volume of Isovue-250 was injected into the epidural space and the flow of contrast was observed. Radiographs were obtained for documentation purposes.    The injectate was administered into the level noted  above.   Additional Comments:  The patient tolerated the procedure well Dressing: 2 x 2 sterile gauze and Band-Aid    Post-procedure details: Patient was observed during the procedure. Post-procedure instructions were reviewed.  Patient left the clinic in stable  condition.     Clinical History: MRI LUMBAR SPINE WITHOUT CONTRAST  TECHNIQUE: Multiplanar, multisequence MR imaging of the lumbar spine was performed. No intravenous contrast was administered.  COMPARISON:  MRI of the lumbar spine June 12, 2014  FINDINGS: Segmentation:  Standard.  Alignment:  There is a levoscoliosis of the lumbar spine.  Vertebrae: Chronic fracture of the L1 vertebral body with loss of approximately 60% of the vertebral body height with mild retropulsion of the superior aspect of the posterior wall into the spinal canal without significant spinal canal stenosis.  Marrow edema is seen in the is superior articular process of L5 on the left side related to degenerative changes.  No acute fracture, evidence of discitis, or bone lesion.  Conus medullaris and cauda equina: Conus extends to the T12-L1 level. Conus and cauda equina appear normal.  Paraspinal and other soft tissues: Negative.  Disc levels:  T12-L1: Small retropulsion of the posterior wall into the spinal canal without significant spinal canal stenosis. Perineural cysts noted on the right side. There is no neural foraminal stenosis.  L1-2: No spinal canal or neural foraminal stenosis.  L2-3: Shallow disc bulge resulting in mild bilateral neural foraminal narrowing. No spinal canal stenosis.  L3-4: Shallow disc bulge and mild facet degenerative changes with ligamentum flavum redundancy resulting in mild left neural foraminal narrowing. No spinal canal stenosis.  L4-5: Disc bulge with superimposed left central disc protrusion, facet degenerative changes with mild ligamentum flavum redundancy resulting in mild spinal canal stenosis with narrowing of the left subarticular zone, mild right and moderate left neural foraminal narrowing.  L5-S1: Facet degenerative changes, left greater than right. No spinal canal or neural foraminal stenosis.  IMPRESSION: 1. No acute  fracture of the lumbar spine. 2. Chronic L1 compression fracture with loss of approximately 60% of vertebral body height with mild retropulsion of the posterior wall into the spinal canal without significant spinal canal stenosis. 3. Multilevel degenerative changes of the lumbar spine, worst at L4-L5 with mild spinal canal stenosis, narrowing of the left subarticular zone, mild right and moderate left neural foraminal narrowing.   Electronically Signed   By: Pedro Earls M.D.   On: 04/24/2019 11:13     Objective:  VS:  HT:    WT:   BMI:     BP:130/78  HR:74bpm  TEMP: ( )  RESP:  Physical Exam Vitals and nursing note reviewed.  Constitutional:      General: She is not in acute distress.    Appearance: Normal appearance. She is not ill-appearing.  HENT:     Head: Normocephalic and atraumatic.     Right Ear: External ear normal.     Left Ear: External ear normal.  Eyes:     Extraocular Movements: Extraocular movements intact.  Cardiovascular:     Rate and Rhythm: Normal rate.     Pulses: Normal pulses.  Pulmonary:     Effort: Pulmonary effort is normal. No respiratory distress.  Abdominal:     General: There is no distension.     Palpations: Abdomen is soft.  Musculoskeletal:        General: Tenderness present.     Cervical back: Neck supple.     Right lower leg: No  edema.     Left lower leg: No edema.     Comments: Patient has good distal strength with no pain over the greater trochanters.  No clonus or focal weakness.  Skin:    Findings: No erythema, lesion or rash.  Neurological:     General: No focal deficit present.     Mental Status: She is alert and oriented to person, place, and time.     Sensory: No sensory deficit.     Motor: No weakness or abnormal muscle tone.     Coordination: Coordination normal.  Psychiatric:        Mood and Affect: Mood normal.        Behavior: Behavior normal.      Imaging: XR C-ARM NO REPORT  Result  Date: 07/12/2020 Please see Notes tab for imaging impression.

## 2020-07-13 NOTE — Procedures (Signed)
Lumbar Epidural Steroid Injection - Interlaminar Approach with Fluoroscopic Guidance  Patient: [name]      Date of Birth: [birth year] MRN: [id number] PCP: [name], MD      Visit Date: 07/12/2020   Universal Protocol:     Consent Given By: the patient  Position: PRONE  Additional Comments: Vital signs were monitored before and after the procedure. Patient was prepped and draped in the usual sterile fashion. The correct patient, procedure, and site was verified.   Injection Procedure Details:   Procedure diagnoses: Lumbar radiculopathy [M54.16]   Meds Administered:  Meds ordered this encounter  Medications  . betamethasone acetate-betamethasone sodium phosphate (CELESTONE) injection 12 mg     Laterality: Left  Location/Site:  T12-L1  Needle: 3.5 in., 20 ga. Tuohy  Needle Placement: Paramedian epidural  Findings:   -Comments: Excellent flow of contrast into the epidural space.  Procedure Details: Using a paramedian approach from the side mentioned above, the region overlying the inferior lamina was localized under fluoroscopic visualization and the soft tissues overlying this structure were infiltrated with 4 ml. of 1% Lidocaine without Epinephrine. The Tuohy needle was inserted into the epidural space using a paramedian approach.   The epidural space was localized using loss of resistance along with counter oblique bi-planar fluoroscopic views.  After negative aspirate for air, blood, and CSF, a 2 ml. volume of Isovue-250 was injected into the epidural space and the flow of contrast was observed. Radiographs were obtained for documentation purposes.    The injectate was administered into the level noted above.   Additional Comments:  The patient tolerated the procedure well Dressing: 2 x 2 sterile gauze and Band-Aid    Post-procedure details: Patient was observed during the procedure. Post-procedure instructions were reviewed.  Patient left the  clinic in stable condition.

## 2020-07-26 ENCOUNTER — Encounter: Payer: Self-pay | Admitting: Family Medicine

## 2020-08-05 DIAGNOSIS — Z1231 Encounter for screening mammogram for malignant neoplasm of breast: Secondary | ICD-10-CM | POA: Diagnosis not present

## 2020-08-05 LAB — HM MAMMOGRAPHY

## 2020-08-08 ENCOUNTER — Other Ambulatory Visit: Payer: Self-pay | Admitting: Physician Assistant

## 2020-08-09 ENCOUNTER — Inpatient Hospital Stay (HOSPITAL_BASED_OUTPATIENT_CLINIC_OR_DEPARTMENT_OTHER)
Admission: EM | Admit: 2020-08-09 | Discharge: 2020-08-22 | DRG: 483 | Disposition: A | Discharge status: Home / Self Care | Payer: PPO | Attending: General Surgery | Admitting: General Surgery

## 2020-08-09 ENCOUNTER — Emergency Department (HOSPITAL_BASED_OUTPATIENT_CLINIC_OR_DEPARTMENT_OTHER): Payer: PPO

## 2020-08-09 ENCOUNTER — Telehealth: Payer: Self-pay

## 2020-08-09 ENCOUNTER — Encounter (HOSPITAL_BASED_OUTPATIENT_CLINIC_OR_DEPARTMENT_OTHER): Payer: Self-pay

## 2020-08-09 ENCOUNTER — Other Ambulatory Visit: Payer: Self-pay

## 2020-08-09 ENCOUNTER — Encounter: Payer: Self-pay | Admitting: Family Medicine

## 2020-08-09 DIAGNOSIS — Z471 Aftercare following joint replacement surgery: Secondary | ICD-10-CM | POA: Diagnosis not present

## 2020-08-09 DIAGNOSIS — S42291A Other displaced fracture of upper end of right humerus, initial encounter for closed fracture: Secondary | ICD-10-CM | POA: Diagnosis not present

## 2020-08-09 DIAGNOSIS — I700 Atherosclerosis of aorta: Secondary | ICD-10-CM | POA: Diagnosis not present

## 2020-08-09 DIAGNOSIS — I252 Old myocardial infarction: Secondary | ICD-10-CM | POA: Diagnosis not present

## 2020-08-09 DIAGNOSIS — E559 Vitamin D deficiency, unspecified: Secondary | ICD-10-CM | POA: Diagnosis present

## 2020-08-09 DIAGNOSIS — E785 Hyperlipidemia, unspecified: Secondary | ICD-10-CM | POA: Diagnosis present

## 2020-08-09 DIAGNOSIS — S199XXA Unspecified injury of neck, initial encounter: Secondary | ICD-10-CM | POA: Diagnosis not present

## 2020-08-09 DIAGNOSIS — M25461 Effusion, right knee: Secondary | ICD-10-CM | POA: Diagnosis not present

## 2020-08-09 DIAGNOSIS — R531 Weakness: Secondary | ICD-10-CM | POA: Diagnosis not present

## 2020-08-09 DIAGNOSIS — Z7982 Long term (current) use of aspirin: Secondary | ICD-10-CM

## 2020-08-09 DIAGNOSIS — M810 Age-related osteoporosis without current pathological fracture: Secondary | ICD-10-CM | POA: Diagnosis present

## 2020-08-09 DIAGNOSIS — E034 Atrophy of thyroid (acquired): Secondary | ICD-10-CM

## 2020-08-09 DIAGNOSIS — S01111A Laceration without foreign body of right eyelid and periocular area, initial encounter: Secondary | ICD-10-CM | POA: Diagnosis not present

## 2020-08-09 DIAGNOSIS — Z800 Family history of malignant neoplasm of digestive organs: Secondary | ICD-10-CM

## 2020-08-09 DIAGNOSIS — S42201A Unspecified fracture of upper end of right humerus, initial encounter for closed fracture: Secondary | ICD-10-CM | POA: Diagnosis not present

## 2020-08-09 DIAGNOSIS — S82141A Displaced bicondylar fracture of right tibia, initial encounter for closed fracture: Secondary | ICD-10-CM | POA: Diagnosis not present

## 2020-08-09 DIAGNOSIS — M1711 Unilateral primary osteoarthritis, right knee: Secondary | ICD-10-CM | POA: Diagnosis not present

## 2020-08-09 DIAGNOSIS — Z7952 Long term (current) use of systemic steroids: Secondary | ICD-10-CM

## 2020-08-09 DIAGNOSIS — Z96611 Presence of right artificial shoulder joint: Secondary | ICD-10-CM | POA: Diagnosis not present

## 2020-08-09 DIAGNOSIS — R400 Somnolence: Secondary | ICD-10-CM | POA: Diagnosis not present

## 2020-08-09 DIAGNOSIS — S42294A Other nondisplaced fracture of upper end of right humerus, initial encounter for closed fracture: Secondary | ICD-10-CM | POA: Diagnosis not present

## 2020-08-09 DIAGNOSIS — S4291XA Fracture of right shoulder girdle, part unspecified, initial encounter for closed fracture: Secondary | ICD-10-CM

## 2020-08-09 DIAGNOSIS — Z79890 Hormone replacement therapy (postmenopausal): Secondary | ICD-10-CM | POA: Diagnosis not present

## 2020-08-09 DIAGNOSIS — W19XXXA Unspecified fall, initial encounter: Secondary | ICD-10-CM | POA: Diagnosis not present

## 2020-08-09 DIAGNOSIS — T4275XA Adverse effect of unspecified antiepileptic and sedative-hypnotic drugs, initial encounter: Secondary | ICD-10-CM | POA: Diagnosis not present

## 2020-08-09 DIAGNOSIS — F419 Anxiety disorder, unspecified: Secondary | ICD-10-CM | POA: Diagnosis present

## 2020-08-09 DIAGNOSIS — E039 Hypothyroidism, unspecified: Secondary | ICD-10-CM | POA: Diagnosis not present

## 2020-08-09 DIAGNOSIS — R45851 Suicidal ideations: Secondary | ICD-10-CM | POA: Diagnosis present

## 2020-08-09 DIAGNOSIS — K219 Gastro-esophageal reflux disease without esophagitis: Secondary | ICD-10-CM | POA: Diagnosis present

## 2020-08-09 DIAGNOSIS — R410 Disorientation, unspecified: Secondary | ICD-10-CM | POA: Diagnosis not present

## 2020-08-09 DIAGNOSIS — S82491A Other fracture of shaft of right fibula, initial encounter for closed fracture: Secondary | ICD-10-CM | POA: Diagnosis present

## 2020-08-09 DIAGNOSIS — Z820 Family history of epilepsy and other diseases of the nervous system: Secondary | ICD-10-CM | POA: Diagnosis not present

## 2020-08-09 DIAGNOSIS — D638 Anemia in other chronic diseases classified elsewhere: Secondary | ICD-10-CM | POA: Diagnosis not present

## 2020-08-09 DIAGNOSIS — I2510 Atherosclerotic heart disease of native coronary artery without angina pectoris: Secondary | ICD-10-CM | POA: Diagnosis present

## 2020-08-09 DIAGNOSIS — S022XXA Fracture of nasal bones, initial encounter for closed fracture: Secondary | ICD-10-CM

## 2020-08-09 DIAGNOSIS — S3991XA Unspecified injury of abdomen, initial encounter: Secondary | ICD-10-CM | POA: Diagnosis not present

## 2020-08-09 DIAGNOSIS — M353 Polymyalgia rheumatica: Secondary | ICD-10-CM | POA: Diagnosis present

## 2020-08-09 DIAGNOSIS — S42301A Unspecified fracture of shaft of humerus, right arm, initial encounter for closed fracture: Secondary | ICD-10-CM | POA: Diagnosis not present

## 2020-08-09 DIAGNOSIS — W108XXA Fall (on) (from) other stairs and steps, initial encounter: Secondary | ICD-10-CM | POA: Diagnosis present

## 2020-08-09 DIAGNOSIS — W109XXA Fall (on) (from) unspecified stairs and steps, initial encounter: Secondary | ICD-10-CM | POA: Diagnosis not present

## 2020-08-09 DIAGNOSIS — I10 Essential (primary) hypertension: Secondary | ICD-10-CM | POA: Diagnosis not present

## 2020-08-09 DIAGNOSIS — S0121XA Laceration without foreign body of nose, initial encounter: Secondary | ICD-10-CM | POA: Diagnosis not present

## 2020-08-09 DIAGNOSIS — Z79899 Other long term (current) drug therapy: Secondary | ICD-10-CM

## 2020-08-09 DIAGNOSIS — G8918 Other acute postprocedural pain: Secondary | ICD-10-CM | POA: Diagnosis not present

## 2020-08-09 DIAGNOSIS — D62 Acute posthemorrhagic anemia: Secondary | ICD-10-CM | POA: Diagnosis not present

## 2020-08-09 DIAGNOSIS — Z20822 Contact with and (suspected) exposure to covid-19: Secondary | ICD-10-CM | POA: Diagnosis not present

## 2020-08-09 DIAGNOSIS — F3342 Major depressive disorder, recurrent, in full remission: Secondary | ICD-10-CM | POA: Diagnosis present

## 2020-08-09 DIAGNOSIS — S42231A 3-part fracture of surgical neck of right humerus, initial encounter for closed fracture: Secondary | ICD-10-CM | POA: Diagnosis present

## 2020-08-09 DIAGNOSIS — R0902 Hypoxemia: Secondary | ICD-10-CM

## 2020-08-09 DIAGNOSIS — S42251A Displaced fracture of greater tuberosity of right humerus, initial encounter for closed fracture: Secondary | ICD-10-CM | POA: Diagnosis not present

## 2020-08-09 DIAGNOSIS — Y92008 Other place in unspecified non-institutional (private) residence as the place of occurrence of the external cause: Secondary | ICD-10-CM | POA: Diagnosis not present

## 2020-08-09 DIAGNOSIS — S4290XA Fracture of unspecified shoulder girdle, part unspecified, initial encounter for closed fracture: Secondary | ICD-10-CM | POA: Diagnosis present

## 2020-08-09 DIAGNOSIS — Z8249 Family history of ischemic heart disease and other diseases of the circulatory system: Secondary | ICD-10-CM

## 2020-08-09 DIAGNOSIS — S82401A Unspecified fracture of shaft of right fibula, initial encounter for closed fracture: Secondary | ICD-10-CM | POA: Diagnosis not present

## 2020-08-09 DIAGNOSIS — M4856XA Collapsed vertebra, not elsewhere classified, lumbar region, initial encounter for fracture: Secondary | ICD-10-CM | POA: Diagnosis not present

## 2020-08-09 DIAGNOSIS — S42351A Displaced comminuted fracture of shaft of humerus, right arm, initial encounter for closed fracture: Secondary | ICD-10-CM | POA: Diagnosis not present

## 2020-08-09 DIAGNOSIS — S82201A Unspecified fracture of shaft of right tibia, initial encounter for closed fracture: Secondary | ICD-10-CM | POA: Diagnosis not present

## 2020-08-09 LAB — CBC WITH DIFFERENTIAL/PLATELET
Abs Immature Granulocytes: 0.06 10*3/uL (ref 0.00–0.07)
Basophils Absolute: 0.1 10*3/uL (ref 0.0–0.1)
Basophils Relative: 0 %
Eosinophils Absolute: 0.1 10*3/uL (ref 0.0–0.5)
Eosinophils Relative: 1 %
HCT: 39.9 % (ref 36.0–46.0)
Hemoglobin: 13.1 g/dL (ref 12.0–15.0)
Immature Granulocytes: 0 %
Lymphocytes Relative: 7 %
Lymphs Abs: 1 10*3/uL (ref 0.7–4.0)
MCH: 31 pg (ref 26.0–34.0)
MCHC: 32.8 g/dL (ref 30.0–36.0)
MCV: 94.5 fL (ref 80.0–100.0)
Monocytes Absolute: 0.9 10*3/uL (ref 0.1–1.0)
Monocytes Relative: 6 %
Neutro Abs: 12.7 10*3/uL — ABNORMAL HIGH (ref 1.7–7.7)
Neutrophils Relative %: 86 %
Platelets: 241 10*3/uL (ref 150–400)
RBC: 4.22 MIL/uL (ref 3.87–5.11)
RDW: 13.1 % (ref 11.5–15.5)
WBC: 14.8 10*3/uL — ABNORMAL HIGH (ref 4.0–10.5)
nRBC: 0 % (ref 0.0–0.2)

## 2020-08-09 LAB — RESP PANEL BY RT-PCR (FLU A&B, COVID) ARPGX2
Influenza A by PCR: NEGATIVE
Influenza B by PCR: NEGATIVE
SARS Coronavirus 2 by RT PCR: NEGATIVE

## 2020-08-09 LAB — COMPREHENSIVE METABOLIC PANEL
ALT: 20 U/L (ref 0–44)
AST: 27 U/L (ref 15–41)
Albumin: 3.6 g/dL (ref 3.5–5.0)
Alkaline Phosphatase: 85 U/L (ref 38–126)
Anion gap: 7 (ref 5–15)
BUN: 17 mg/dL (ref 8–23)
CO2: 25 mmol/L (ref 22–32)
Calcium: 8.8 mg/dL — ABNORMAL LOW (ref 8.9–10.3)
Chloride: 104 mmol/L (ref 98–111)
Creatinine, Ser: 1.03 mg/dL — ABNORMAL HIGH (ref 0.44–1.00)
GFR, Estimated: 58 mL/min — ABNORMAL LOW (ref >60)
Glucose, Bld: 130 mg/dL — ABNORMAL HIGH (ref 70–99)
Potassium: 3.6 mmol/L (ref 3.5–5.1)
Sodium: 136 mmol/L (ref 135–145)
Total Bilirubin: 0.9 mg/dL (ref 0.3–1.2)
Total Protein: 6.5 g/dL (ref 6.5–8.1)

## 2020-08-09 MED ORDER — HYDROMORPHONE HCL 1 MG/ML IJ SOLN
INTRAMUSCULAR | Status: AC
  Administered 2020-08-09: 1 mg via INTRAVENOUS
  Filled 2020-08-09: qty 1

## 2020-08-09 MED ORDER — HYDROMORPHONE HCL 1 MG/ML IJ SOLN
1.0000 mg | Freq: Once | INTRAMUSCULAR | Status: AC
  Administered 2020-08-09: 1 mg via INTRAVENOUS
  Filled 2020-08-09: qty 1

## 2020-08-09 MED ORDER — HYDROMORPHONE HCL 1 MG/ML IJ SOLN: 1.0000 mg | Freq: Once | INTRAMUSCULAR | Status: AC

## 2020-08-09 MED ORDER — HYDROMORPHONE HCL 1 MG/ML IJ SOLN
0.5000 mg | Freq: Once | INTRAMUSCULAR | Status: AC
  Administered 2020-08-09: 0.5 mg via INTRAVENOUS
  Filled 2020-08-09: qty 1

## 2020-08-09 MED ORDER — IOHEXOL 300 MG/ML  SOLN
75.0000 mL | Freq: Once | INTRAMUSCULAR | Status: AC | PRN
  Administered 2020-08-09: 75 mL via INTRAVENOUS

## 2020-08-09 MED ORDER — LIDOCAINE-EPINEPHRINE-TETRACAINE (LET) TOPICAL GEL
3.0000 mL | Freq: Once | TOPICAL | Status: AC
  Administered 2020-08-09: 3 mL via TOPICAL
  Filled 2020-08-09: qty 3

## 2020-08-09 NOTE — ED Notes (Signed)
Xray informed that pain is not tolerable and will call them when pain is more under controlled

## 2020-08-09 NOTE — ED Notes (Signed)
Pt returned from xray, tolerated fair per tech

## 2020-08-09 NOTE — ED Triage Notes (Signed)
Pt arrived ems  [facility]  from tripping over step fell on a deck  has lac on bridge of nose  bleeding controlled,  also c/o rt shoulder pain

## 2020-08-09 NOTE — ED Triage Notes (Addendum)
Pt arrived by EMS, pt fell and injured rt. Shoulder. Did receive 150 mcg fentyl in route.  Pt screaming in pain, will not allow [place] to assess until more pain meds given.  Shirt has been cut off per pt request.

## 2020-08-09 NOTE — Telephone Encounter (Signed)
error 

## 2020-08-09 NOTE — ED Notes (Addendum)
Pt sleeping spouse at bedside sats down to 45%, RT and tech at bedside pt on 4L oxygen per [place], sats responded well.  Pt aware she can not eat or drink at present time. MD will speak to pt re: plans

## 2020-08-09 NOTE — ED Provider Notes (Signed)
[facility] EMERGENCY DEPARTMENT Provider Note   CSN: [id number] Arrival date & time: 08/09/20  1807     History Chief Complaint  Patient presents with   Shoulder Injury    [name] is a 70 y.o. female.  The history is provided by the patient and medical records. No language interpreter was used.  Shoulder Injury This is a new problem. The current episode started less than 1 hour ago. The problem occurs rarely. The problem has not changed since onset.Pertinent negatives include no chest pain, no abdominal pain, no headaches and no shortness of breath. The symptoms are aggravated by twisting. Nothing relieves the symptoms. She has tried nothing (fentanyl with EMS) for the symptoms. The treatment provided no relief.      Past Medical History:  Diagnosis Date   Anxiety    Arthritis    CAD (coronary artery disease)    Cataract    Depression    DISORDER, MENOPAUSAL NOS 03/20/2007   Qualifier: Diagnosis of  By: Arnoldo Morale MD, Balinda Quails    GERD (gastroesophageal reflux disease)    Hyperlipidemia    Hypothyroidism    MYOCARDIAL INFARCTION, HX OF 09/09/2007   Qualifier: Diagnosis of  By: Leanne Chang MD, Bruce     Osteoporosis    Polymyalgia rheumatica [facility])     Patient Active Problem List   Diagnosis Date Noted   Lumbar compression fracture ([place]) 05/11/2019   Educated about COVID-19 virus infection 01/26/2019   Dyslipidemia 01/26/2019   Drug-induced myopathy 01/14/2019   Haglund's deformity 01/12/2019   Myalgia 12/30/2018   High risk medication use 12/04/2018   Recurrent major depressive disorder, in full remission ([place]) 09/03/2018   Lower esophageal ring ([place]) 03/03/2018   Osteoarthritis of right hip 12/02/2017   GERD (gastroesophageal reflux disease) 12/15/2015   Polyp of colon, adenomatous 10/11/2014   Vitamin D deficiency 05/26/2014   Osteoporosis 05/26/2014   Polymyalgia rheumatica ([employer]) 12/01/2010   CAD (coronary artery disease) 09/09/2007   Hematuria  09/09/2007   Depression 03/20/2007   Hypothyroid 02/13/1999    Past Surgical History:  Procedure Laterality Date   CATARACT EXTRACTION     CESAREAN SECTION     CHOLECYSTECTOMY N/A 11/24/2012   Procedure: LAPAROSCOPIC CHOLECYSTECTOMY;  Surgeon: Ralene Ok, MD;  Location: [place] [facility];  Service: General;  Laterality: N/A;   COSMETIC SURGERY     EYE SURGERY     eye lids lifted   PTCA       OB History   No obstetric history on file.     Family History  Problem Relation Age of Onset   Hypertension Father    Heart disease Father        Mi age 55, 71, 33 and 30.   Cancer Father    Hyperlipidemia Father    Colon cancer Maternal Uncle    Hypertension Mother    Cancer Mother    Hyperlipidemia Mother    Pulmonary fibrosis Sister    Epilepsy Son    Healthy Son    Myasthenia gravis Son    Healthy Son    Rheum arthritis Niece    Lupus Niece    Rheum arthritis Niece     Social History   Tobacco Use   Smoking status: Never   Smokeless tobacco: Never  Vaping Use   Vaping Use: Never used  Substance Use Topics   Alcohol use: No    Alcohol/week: 0.0 standard drinks   Drug use: No    Home Medications Prior  to Admission medications   Medication Sig Start Date End Date Taking? Authorizing Provider  aspirin 81 MG EC tablet Take 81 mg by mouth daily.   Yes [provider]  cholecalciferol (VITAMIN D3) 25 MCG (1000 UNIT) tablet Take 1,000 Units by mouth daily.   Yes [provider]  levothyroxine (SYNTHROID) 100 MCG tablet TAKE 1 TABLET BY MOUTH DAILY 01/06/20  Yes [name], MD  pantoprazole (PROTONIX) 40 MG tablet Take 1 tablet (40 mg total) by mouth daily. 02/23/20  Yes [name], MD  PARoxetine (PAXIL) 20 MG tablet TAKE 1 TABLET BY MOUTH EVERY MORNING 05/17/20  Yes [name], MD  predniSONE (DELTASONE) 5 MG tablet TAKE 1 TABLET BY MOUTH EVERY DAY WITH BREAKFAST 04/25/20  Yes Ofilia Neas, PA-C  REPATHA SURECLICK 893 MG/ML SOAJ INJECT 1  PEN SUBCUTANEOUSLY EVERY 14 DAYS 01/14/20  Yes Minus Breeding, MD  Vitamin D, Ergocalciferol, (DRISDOL) 1.25 MG (50000 UNIT) CAPS capsule Take 1 capsule (50,000 Units total) by mouth every 7 (seven) days. 06/23/20  Yes [name], MD    Allergies    Morphine and related, Prochlorperazine, Simvastatin, and Sulfa antibiotics  Review of Systems   Review of Systems  Constitutional:  Negative for chills, diaphoresis, fatigue and fever.  HENT:  Negative for congestion.   Eyes:  Negative for visual disturbance.  Respiratory:  Negative for cough, chest tightness, shortness of breath and wheezing.   Cardiovascular:  Negative for chest pain and palpitations.  Gastrointestinal:  Negative for abdominal pain, constipation, diarrhea, nausea and vomiting.  Genitourinary:  Negative for dysuria, flank pain and frequency.  Musculoskeletal:  Negative for back pain, neck pain and neck stiffness.  Skin:  Positive for wound.  Neurological:  Negative for light-headedness and headaches.  Psychiatric/Behavioral:  Positive for agitation.   All other systems reviewed and are negative.  Physical Exam Updated Vital Signs BP (!) 159/87 (BP Location: Left Arm)   Pulse 79   Temp 98 F (36.7 C) (Oral)   Resp 18   Ht 5\' 2"  (1.575 m)   Wt 72.6 kg   SpO2 100%   BMI 29.26 kg/m   Physical Exam Vitals and nursing note reviewed.  Constitutional:      General: She is not in acute distress.    Appearance: She is well-developed. She is ill-appearing. She is not toxic-appearing or diaphoretic.  HENT:     Head: Laceration present.      Nose: No congestion or rhinorrhea.     Mouth/Throat:     Mouth: Mucous membranes are moist.     Pharynx: No oropharyngeal exudate or posterior oropharyngeal erythema.  Eyes:     Extraocular Movements: Extraocular movements intact.     Conjunctiva/sclera: Conjunctivae normal.     Pupils: Pupils are equal, round, and reactive to light.  Cardiovascular:     Rate and Rhythm:  Normal rate and regular rhythm.     Heart sounds: No murmur heard. Pulmonary:     Effort: Pulmonary effort is normal. No respiratory distress.     Breath sounds: Normal breath sounds. No wheezing, rhonchi or rales.  Chest:     Chest wall: Tenderness present.  Abdominal:     Palpations: Abdomen is soft.     Tenderness: There is no abdominal tenderness. There is no right CVA tenderness, left CVA tenderness, guarding or rebound.  Musculoskeletal:        General: Tenderness, deformity and signs of injury present.     Right  shoulder: Swelling, deformity, tenderness and bony tenderness present. No laceration. Normal pulse.       Arms:     Cervical back: Neck supple. No tenderness.     Right knee: Tenderness present.     Comments: Intact sensation, strength, and pulses in hands.  Mild tenderness of the right knee.  Skin:    General: Skin is warm and dry.     Capillary Refill: Capillary refill takes less than 2 seconds.     Findings: No erythema.  Neurological:     General: No focal deficit present.     Mental Status: She is alert.     Sensory: No sensory deficit.     Motor: No weakness.    ED Results / Procedures / Treatments   Labs (all labs ordered are listed, but only abnormal results are displayed) Labs Reviewed  CBC WITH DIFFERENTIAL/PLATELET - Abnormal; Notable for the following components:      Result Value   WBC 14.8 (*)    Neutro Abs 12.7 (*)    All other components within normal limits  COMPREHENSIVE METABOLIC PANEL - Abnormal; Notable for the following components:   Glucose, Bld 130 (*)    Creatinine, Ser 1.03 (*)    Calcium 8.8 (*)    GFR, Estimated 58 (*)    All other components within normal limits  RESP PANEL BY RT-PCR (FLU A&B, COVID) [id number]    EKG None  Radiology CT Head Wo Contrast  Result Date: 08/09/2020 CLINICAL DATA:  Head trauma EXAM: CT HEAD WITHOUT CONTRAST CT MAXILLOFACIAL WITHOUT CONTRAST CT CERVICAL SPINE WITHOUT CONTRAST TECHNIQUE:  Multidetector CT imaging of the head, cervical spine, and maxillofacial structures were performed using the standard protocol without intravenous contrast. Multiplanar CT image reconstructions of the cervical spine and maxillofacial structures were also generated. COMPARISON:  None. FINDINGS: CT HEAD FINDINGS Brain: No acute territorial infarction, hemorrhage, or intracranial mass. Encephalomalacia within the inferior left cerebellar hemisphere consistent with chronic infarct. Nonenlarged ventricles. Mild atrophy Vascular: No hyperdense vessels. Vertebral and carotid vascular calcification. Skull: No depressed skull fracture Other: None CT MAXILLOFACIAL FINDINGS Osseous: Mandibular heads are normally position. No mandibular fracture. Mastoid air cells are clear. Pterygoid plates and zygomatic arches are intact. Acute bilateral mildly depressed nasal bone fractures. Orbits: Negative. No traumatic or inflammatory finding. Sinuses: Fluid level left maxillary sinus without definitive sinus wall fracture. Small fluid levels in the posterior ethmoid sinuses. Soft tissues: Swelling over the nasal region and left forehead. CT CERVICAL SPINE FINDINGS Alignment: Within normal limits.  Facet alignment is normal Skull base and vertebrae: No acute fracture. No primary bone lesion or focal pathologic process. Soft tissues and spinal canal: No prevertebral fluid or swelling. No visible canal hematoma. Disc levels:  Disc spaces are patent. Upper chest: Negative. Other: None IMPRESSION: 1. No CT evidence for acute intracranial abnormality. Chronic left cerebellar infarct and mild atrophy 2. No CT evidence for acute osseous abnormality of the cervical spine 3. Acute bilateral mildly displaced nasal bone fracture. Fluid levels in left maxillary and posterior ethmoid sinuses but without definitive sinus fracture identified. Electronically Signed   By: Donavan Foil M.D.   On: 08/09/2020 20:11   CT Chest W Contrast  Result Date:  08/09/2020 CLINICAL DATA:  Chest trauma, hypoxia EXAM: CT CHEST WITH CONTRAST TECHNIQUE: Multidetector CT imaging of the chest was performed during intravenous contrast administration. CONTRAST:  4mL OMNIPAQUE IOHEXOL 300 MG/ML  SOLN COMPARISON:  None. FINDINGS: Cardiovascular: Heart is normal size. Aorta normal caliber.  Scattered aortic calcifications. Mediastinum/Nodes: No mediastinal, hilar, or axillary adenopathy. Trachea and esophagus are unremarkable. Thyroid unremarkable. Lungs/Pleura: Lungs are clear. No focal airspace opacities or suspicious nodules. No effusions. No pneumothorax Upper Abdomen: Imaging into the upper abdomen demonstrates no acute findings. Musculoskeletal: Chest wall soft tissues are unremarkable. Comminuted proximal right humeral fracture noted. IMPRESSION: Comminuted proximal right humeral fracture. No acute cardiopulmonary disease. Aortic Atherosclerosis (ICD10-I70.0). Electronically Signed   By: Rolm Baptise M.D.   On: 08/09/2020 23:09   CT Cervical Spine Wo Contrast  Result Date: 08/09/2020 CLINICAL DATA:  Head trauma EXAM: CT HEAD WITHOUT CONTRAST CT MAXILLOFACIAL WITHOUT CONTRAST CT CERVICAL SPINE WITHOUT CONTRAST TECHNIQUE: Multidetector CT imaging of the head, cervical spine, and maxillofacial structures were performed using the standard protocol without intravenous contrast. Multiplanar CT image reconstructions of the cervical spine and maxillofacial structures were also generated. COMPARISON:  None. FINDINGS: CT HEAD FINDINGS Brain: No acute territorial infarction, hemorrhage, or intracranial mass. Encephalomalacia within the inferior left cerebellar hemisphere consistent with chronic infarct. Nonenlarged ventricles. Mild atrophy Vascular: No hyperdense vessels. Vertebral and carotid vascular calcification. Skull: No depressed skull fracture Other: None CT MAXILLOFACIAL FINDINGS Osseous: Mandibular heads are normally position. No mandibular fracture. Mastoid air cells are  clear. Pterygoid plates and zygomatic arches are intact. Acute bilateral mildly depressed nasal bone fractures. Orbits: Negative. No traumatic or inflammatory finding. Sinuses: Fluid level left maxillary sinus without definitive sinus wall fracture. Small fluid levels in the posterior ethmoid sinuses. Soft tissues: Swelling over the nasal region and left forehead. CT CERVICAL SPINE FINDINGS Alignment: Within normal limits.  Facet alignment is normal Skull base and vertebrae: No acute fracture. No primary bone lesion or focal pathologic process. Soft tissues and spinal canal: No prevertebral fluid or swelling. No visible canal hematoma. Disc levels:  Disc spaces are patent. Upper chest: Negative. Other: None IMPRESSION: 1. No CT evidence for acute intracranial abnormality. Chronic left cerebellar infarct and mild atrophy 2. No CT evidence for acute osseous abnormality of the cervical spine 3. Acute bilateral mildly displaced nasal bone fracture. Fluid levels in left maxillary and posterior ethmoid sinuses but without definitive sinus fracture identified. Electronically Signed   By: Donavan Foil M.D.   On: 08/09/2020 20:11   CT Shoulder Right Wo Contrast  Result Date: 08/09/2020 CLINICAL DATA:  Shoulder trauma EXAM: CT OF THE UPPER RIGHT EXTREMITY WITHOUT CONTRAST TECHNIQUE: Multidetector CT imaging of the upper right extremity was performed according to the standard protocol. COMPARISON:  Plain films earlier today FINDINGS: Bones/Joint/Cartilage Highly comminuted fracture noted through the proximal right humerus involving the humeral neck and proximal shaft and the greater tuberosity region. Significant displacement of multiple fracture fragments. No subluxation or dislocation. Ligaments Suboptimally assessed by CT. Muscles and Tendons Probable hematoma within the right deltoid muscle and pectoralis muscle. Soft tissues Intact IMPRESSION: Highly comminuted displaced proximal right humeral fracture involving the  greater tuberosity, humeral neck and proximal shaft. Probable surrounding intramuscular hematoma. Electronically Signed   By: Rolm Baptise M.D.   On: 08/09/2020 23:12   DG Shoulder Right Portable  Result Date: 08/09/2020 CLINICAL DATA:  Fall, shoulder pain EXAM: PORTABLE RIGHT SHOULDER COMPARISON:  None. FINDINGS: Comminuted proximal right humeral fracture involving the humeral neck and greater tuberosity region of the humeral head. No subluxation or dislocation. Significantly displaced fracture fragments. IMPRESSION: Displaced, comminuted proximal right humeral fracture. Electronically Signed   By: Rolm Baptise M.D.   On: 08/09/2020 19:45   DG Knee Complete 4 Views Right  Result Date:  08/09/2020 CLINICAL DATA:  Fall, knee pain EXAM: RIGHT KNEE - COMPLETE 4+ VIEW COMPARISON:  None. FINDINGS: No acute bony abnormality. Specifically, no fracture, subluxation, or dislocation. Joint spaces maintained. Small joint effusion. IMPRESSION: No acute bony abnormality.  Small joint effusion. Electronically Signed   By: Rolm Baptise M.D.   On: 08/09/2020 19:46   CT Maxillofacial Wo Contrast  Result Date: 08/09/2020 CLINICAL DATA:  Head trauma EXAM: CT HEAD WITHOUT CONTRAST CT MAXILLOFACIAL WITHOUT CONTRAST CT CERVICAL SPINE WITHOUT CONTRAST TECHNIQUE: Multidetector CT imaging of the head, cervical spine, and maxillofacial structures were performed using the standard protocol without intravenous contrast. Multiplanar CT image reconstructions of the cervical spine and maxillofacial structures were also generated. COMPARISON:  None. FINDINGS: CT HEAD FINDINGS Brain: No acute territorial infarction, hemorrhage, or intracranial mass. Encephalomalacia within the inferior left cerebellar hemisphere consistent with chronic infarct. Nonenlarged ventricles. Mild atrophy Vascular: No hyperdense vessels. Vertebral and carotid vascular calcification. Skull: No depressed skull fracture Other: None CT MAXILLOFACIAL FINDINGS  Osseous: Mandibular heads are normally position. No mandibular fracture. Mastoid air cells are clear. Pterygoid plates and zygomatic arches are intact. Acute bilateral mildly depressed nasal bone fractures. Orbits: Negative. No traumatic or inflammatory finding. Sinuses: Fluid level left maxillary sinus without definitive sinus wall fracture. Small fluid levels in the posterior ethmoid sinuses. Soft tissues: Swelling over the nasal region and left forehead. CT CERVICAL SPINE FINDINGS Alignment: Within normal limits.  Facet alignment is normal Skull base and vertebrae: No acute fracture. No primary bone lesion or focal pathologic process. Soft tissues and spinal canal: No prevertebral fluid or swelling. No visible canal hematoma. Disc levels:  Disc spaces are patent. Upper chest: Negative. Other: None IMPRESSION: 1. No CT evidence for acute intracranial abnormality. Chronic left cerebellar infarct and mild atrophy 2. No CT evidence for acute osseous abnormality of the cervical spine 3. Acute bilateral mildly displaced nasal bone fracture. Fluid levels in left maxillary and posterior ethmoid sinuses but without definitive sinus fracture identified. Electronically Signed   By: Donavan Foil M.D.   On: 08/09/2020 20:11    Procedures .Marland KitchenLaceration Repair  Date/Time: 08/09/2020 11:56 PM Performed by: Courtney Paris, MD Authorized by: Courtney Paris, MD   Consent:    Consent obtained:  Verbal   Consent given by:  Patient   Risks, benefits, and alternatives were discussed: yes     Risks discussed:  Pain and poor cosmetic result   Alternatives discussed:  No treatment Universal protocol:    Test results available: yes     Imaging studies available: yes     Patient identity confirmed:  Verbally with patient Anesthesia:    Anesthesia method:  Topical application   Topical anesthetic:  LET Laceration details:    Location:  Face   Face location:  Nose   Length (cm):  1   Depth (mm):   1 Pre-procedure details:    Preparation:  Patient was prepped and draped in usual sterile fashion and imaging obtained to evaluate for foreign bodies Exploration:    Imaging outcome: foreign body not noted     Wound exploration: wound explored through full range of motion and entire depth of wound visualized     Contaminated: no   Treatment:    Area cleansed with:  Shur-Clens   Amount of cleaning:  Standard   Irrigation solution:  Sterile saline   Irrigation method:  Syringe   Debridement:  None   Undermining:  None   Scar revision: no  Skin repair:    Repair method:  Sutures   Suture size:  5-0   Suture material:  Fast-absorbing gut   Suture technique:  Simple interrupted   Number of sutures:  4 Approximation:    Approximation:  Close Repair type:    Repair type:  Simple Post-procedure details:    Dressing:  Antibiotic ointment   Procedure completion:  Tolerated   Medications Ordered in ED Medications  HYDROmorphone (DILAUDID) injection 1 mg (1 mg Intravenous Given 08/09/20 1830)  HYDROmorphone (DILAUDID) injection 1 mg (1 mg Intravenous Given 08/09/20 1910)  lidocaine-EPINEPHrine-tetracaine (LET) topical gel (3 mLs Topical Given 08/09/20 2138)  HYDROmorphone (DILAUDID) injection 0.5 mg (0.5 mg Intravenous Given 08/09/20 2238)  iohexol (OMNIPAQUE) 300 MG/ML solution 75 mL (75 mLs Intravenous Contrast Given 08/09/20 2300)  lidocaine-EPINEPHrine-tetracaine (LET) topical gel (3 mLs Topical Given 08/09/20 2309)    ED Course  I have reviewed the triage vital signs and the nursing notes.  Pertinent labs & imaging results that were available during my care of the patient were reviewed by me and considered in my medical decision making (see chart for details).    MDM Rules/Calculators/A&P                          [name] is a 70 y.o. female with a past medical history significant for osteoporosis, anxiety, depression, polymyalgia rheumatica, CAD, hypothyroidism,  hyperlipidemia, GERD, and prior cholecystectomy who presents with a fall and pain.  Patient is brought in by EMS who reports that she fell down a level on a porch and injured her right shoulder, her face, and her right knee.  Patient is screaming in pain and her arm is in a abnormal position on her right arm.n her elbow is twisted and bent towards her sternum and in a natural position.  She is screaming in pain and the 150 mcg of fentanyl have not helped during transport.  Patient also has injury to her face and has a small laceration to the bridge of her nose and reports that her tetanus is up-to-date.  She is primarily only complaining of pain in her shoulder but does say she is having some pain in her right knee.  Denies any hip pain, ankle pain, back pain, chest pain, or abdominal pain.  On exam, patient does indeed have deformity of her right shoulder but she has intact radial ulnar pulse, sensation and strength in her right hand.  Severe tenderness all across the right shoulder.  Breath sounds were symmetric and clear.  Chest was nontender.  Abdomen was nontender.  Back otherwise nontender.  Hips nontender.  Some tenderness in her right knee with movement but otherwise normal sensation, strength, and pulses distal to that.  She has a 1 similar laceration to the bridge of her nose horizontally that is hemostatic now.  Normal pupil exam and extraocular movements.  Clear speech.  She is still screaming in pain.  Patient was given several doses of Dilaudid to help with her pain and we are able to get a portable shoulder x-ray confirming evidence of fracture.  We were able to do a bedside the maneuver to help straighten her arm to try to get into more anatomic position.  We will get CT imaging of her head, neck, face, and x-ray of her right knee to go along with a shoulder x-ray.  She did not want x-rays of her chest.  When images are  completed, anticipate  discussion with orthopedics to discuss management.   Given her degree of discomfort, I anticipate she may need admission for pain control and further management.  We will also further evaluate her nose to determine best strategy for repair.  For proximal humerus.  I spoke with orthopedics who recommended a CT to further evaluate the shoulder.  They also feel that if she is able to go home, sling and follow-up would be appropriate however if she needs to be admitted, make her n.p.o. for midnight and they may talk about repair tomorrow.  On reassessment, patient still having pain.  She also had hypoxia and is started on oxygen.  She is now on several liters nasal cannula to keep her oxygen saturations up.  I am concerned about possible pulmonary contusion given the amount of trauma her torso took.  As we are getting a CT of her shoulder, will get CT of her chest.  We will get screening labs and a COVID swab.  With her amount of discomfort and ill appearance with hypoxia, do not feel she is safe for discharge home.  Will get the CT images and call for admission after is completed.  We will also place some let on her bridge of her nose and repair the small laceration with absorbable sutures.  The wound is not deep so I do not think there is an open fracture but the CT of her face did show some nasal bone fractures.  11:24 PM CT of the shoulder shows highly comminuted fracture of multiple areas.  There is also some likely intramuscular hematoma.  No evidence of large pulmonary contusion or pneumothorax or rib fracture seen in the chest.  Due to the patient's hypoxia, significant shoulder fracture, and nasal fractures, and her uncontrolled pain, I do feel she needs admission for traumatic injuries.  Will call trauma given multiple systems involved.  Per orthopedics, we will make her n.p.o.  Will repair her nasal laceration shortly.  11:51 PM Spoke with Dr. Jamas Lav with trauma who reports she will admit the patient.  She requested patient be transferred ED to ED  so she can be seen sooner.  Spoke with Dr. Alvino Chapel who accept the patient in transfer.  Laceration was repaired without difficulty and with 4 absorbable sutures in the nose.  Trauma will admit after transfer to the ED at [facility].   Final Clinical Impression(s) / ED Diagnoses Final diagnoses:  Closed fracture of right shoulder, initial encounter  Closed fracture of nasal bone, initial encounter  Laceration of nose, initial encounter  Hypoxia     Clinical Impression: 1. Closed fracture of right shoulder, initial encounter   2. Closed fracture of nasal bone, initial encounter   3. Laceration of nose, initial encounter   4. Hypoxia     Disposition: Admit  This note was prepared with assistance of Dragon voice recognition software. Occasional wrong-word or sound-a-like substitutions may have occurred due to the inherent limitations of voice recognition software.     Shantavia Jha, [place] Allegra, MD 08/09/20 [phone]

## 2020-08-09 NOTE — ED Notes (Signed)
Pt gold diamond ring removed from right ring finger. Ring placed in specimen cup with patient label. Specimen cup given to pt spouse.

## 2020-08-10 ENCOUNTER — Emergency Department (HOSPITAL_COMMUNITY): Payer: PPO

## 2020-08-10 DIAGNOSIS — S4290XA Fracture of unspecified shoulder girdle, part unspecified, initial encounter for closed fracture: Secondary | ICD-10-CM | POA: Diagnosis not present

## 2020-08-10 DIAGNOSIS — S82201A Unspecified fracture of shaft of right tibia, initial encounter for closed fracture: Secondary | ICD-10-CM | POA: Diagnosis not present

## 2020-08-10 DIAGNOSIS — I2510 Atherosclerotic heart disease of native coronary artery without angina pectoris: Secondary | ICD-10-CM | POA: Diagnosis present

## 2020-08-10 DIAGNOSIS — S42301A Unspecified fracture of shaft of humerus, right arm, initial encounter for closed fracture: Secondary | ICD-10-CM | POA: Diagnosis not present

## 2020-08-10 DIAGNOSIS — Z820 Family history of epilepsy and other diseases of the nervous system: Secondary | ICD-10-CM | POA: Diagnosis not present

## 2020-08-10 DIAGNOSIS — Z20822 Contact with and (suspected) exposure to covid-19: Secondary | ICD-10-CM | POA: Diagnosis present

## 2020-08-10 DIAGNOSIS — R45851 Suicidal ideations: Secondary | ICD-10-CM | POA: Diagnosis present

## 2020-08-10 DIAGNOSIS — Y92008 Other place in unspecified non-institutional (private) residence as the place of occurrence of the external cause: Secondary | ICD-10-CM | POA: Diagnosis not present

## 2020-08-10 DIAGNOSIS — Z79890 Hormone replacement therapy (postmenopausal): Secondary | ICD-10-CM | POA: Diagnosis not present

## 2020-08-10 DIAGNOSIS — S82401A Unspecified fracture of shaft of right fibula, initial encounter for closed fracture: Secondary | ICD-10-CM | POA: Diagnosis not present

## 2020-08-10 DIAGNOSIS — Z79899 Other long term (current) drug therapy: Secondary | ICD-10-CM | POA: Diagnosis not present

## 2020-08-10 DIAGNOSIS — Z7982 Long term (current) use of aspirin: Secondary | ICD-10-CM | POA: Diagnosis not present

## 2020-08-10 DIAGNOSIS — S42231A 3-part fracture of surgical neck of right humerus, initial encounter for closed fracture: Secondary | ICD-10-CM | POA: Diagnosis present

## 2020-08-10 DIAGNOSIS — M353 Polymyalgia rheumatica: Secondary | ICD-10-CM | POA: Diagnosis present

## 2020-08-10 DIAGNOSIS — K219 Gastro-esophageal reflux disease without esophagitis: Secondary | ICD-10-CM | POA: Diagnosis present

## 2020-08-10 DIAGNOSIS — D62 Acute posthemorrhagic anemia: Secondary | ICD-10-CM | POA: Diagnosis not present

## 2020-08-10 DIAGNOSIS — F419 Anxiety disorder, unspecified: Secondary | ICD-10-CM | POA: Diagnosis present

## 2020-08-10 DIAGNOSIS — E785 Hyperlipidemia, unspecified: Secondary | ICD-10-CM | POA: Diagnosis present

## 2020-08-10 DIAGNOSIS — W19XXXA Unspecified fall, initial encounter: Secondary | ICD-10-CM | POA: Diagnosis present

## 2020-08-10 DIAGNOSIS — I252 Old myocardial infarction: Secondary | ICD-10-CM | POA: Diagnosis not present

## 2020-08-10 DIAGNOSIS — S022XXA Fracture of nasal bones, initial encounter for closed fracture: Secondary | ICD-10-CM | POA: Diagnosis present

## 2020-08-10 DIAGNOSIS — W108XXA Fall (on) (from) other stairs and steps, initial encounter: Secondary | ICD-10-CM | POA: Diagnosis present

## 2020-08-10 DIAGNOSIS — F3342 Major depressive disorder, recurrent, in full remission: Secondary | ICD-10-CM | POA: Diagnosis present

## 2020-08-10 DIAGNOSIS — E039 Hypothyroidism, unspecified: Secondary | ICD-10-CM | POA: Diagnosis present

## 2020-08-10 DIAGNOSIS — Z8249 Family history of ischemic heart disease and other diseases of the circulatory system: Secondary | ICD-10-CM | POA: Diagnosis not present

## 2020-08-10 DIAGNOSIS — S0121XA Laceration without foreign body of nose, initial encounter: Secondary | ICD-10-CM | POA: Diagnosis present

## 2020-08-10 DIAGNOSIS — Z7952 Long term (current) use of systemic steroids: Secondary | ICD-10-CM | POA: Diagnosis not present

## 2020-08-10 DIAGNOSIS — E559 Vitamin D deficiency, unspecified: Secondary | ICD-10-CM | POA: Diagnosis present

## 2020-08-10 DIAGNOSIS — Z800 Family history of malignant neoplasm of digestive organs: Secondary | ICD-10-CM | POA: Diagnosis not present

## 2020-08-10 DIAGNOSIS — S82141A Displaced bicondylar fracture of right tibia, initial encounter for closed fracture: Secondary | ICD-10-CM | POA: Diagnosis present

## 2020-08-10 DIAGNOSIS — S82491A Other fracture of shaft of right fibula, initial encounter for closed fracture: Secondary | ICD-10-CM | POA: Diagnosis present

## 2020-08-10 MED ORDER — HYDROMORPHONE HCL 1 MG/ML IJ SOLN
1.0000 mg | Freq: Once | INTRAMUSCULAR | Status: AC
  Administered 2020-08-10: 1 mg via INTRAVENOUS
  Filled 2020-08-10: qty 1

## 2020-08-10 MED ORDER — ENOXAPARIN SODIUM 30 MG/0.3ML IJ SOSY: 30.0000 mg | PREFILLED_SYRINGE | Freq: Two times a day (BID) | INTRAMUSCULAR | Status: DC

## 2020-08-10 MED ORDER — ONDANSETRON HCL 4 MG/2ML IJ SOLN
4.0000 mg | Freq: Four times a day (QID) | INTRAMUSCULAR | Status: DC | PRN
  Administered 2020-08-10 – 2020-08-18 (×13): 4 mg via INTRAVENOUS
  Filled 2020-08-10 (×15): qty 2

## 2020-08-10 MED ORDER — MORPHINE SULFATE (PF) 4 MG/ML IV SOLN: 4.0000 mg | INTRAVENOUS | Status: DC | PRN

## 2020-08-10 MED ORDER — OXYCODONE HCL 5 MG PO TABS
5.0000 mg | ORAL_TABLET | ORAL | Status: DC | PRN
  Administered 2020-08-10 – 2020-08-17 (×9): 10 mg via ORAL
  Filled 2020-08-10: qty 2
  Filled 2020-08-10: qty 1
  Filled 2020-08-10 (×3): qty 2
  Filled 2020-08-10: qty 1
  Filled 2020-08-10 (×5): qty 2

## 2020-08-10 MED ORDER — ACETAMINOPHEN 500 MG PO TABS
1000.0000 mg | ORAL_TABLET | Freq: Four times a day (QID) | ORAL | Status: DC
  Administered 2020-08-10 (×3): 1000 mg via ORAL
  Filled 2020-08-10 (×5): qty 2

## 2020-08-10 MED ORDER — HYDROMORPHONE HCL 1 MG/ML IJ SOLN
0.5000 mg | INTRAMUSCULAR | Status: DC | PRN
Start: 2020-08-10 — End: 2020-08-12
  Administered 2020-08-10 – 2020-08-11 (×7): 1 mg via INTRAVENOUS
  Filled 2020-08-10 (×9): qty 1

## 2020-08-10 MED ORDER — ONDANSETRON 4 MG PO TBDP
4.0000 mg | ORAL_TABLET | Freq: Four times a day (QID) | ORAL | Status: DC | PRN
  Administered 2020-08-18: 4 mg via ORAL
  Filled 2020-08-10: qty 1

## 2020-08-10 MED ORDER — DOCUSATE SODIUM 100 MG PO CAPS
100.0000 mg | ORAL_CAPSULE | Freq: Two times a day (BID) | ORAL | Status: DC
  Administered 2020-08-10 – 2020-08-22 (×17): 100 mg via ORAL
  Filled 2020-08-10 (×23): qty 1

## 2020-08-10 MED ORDER — LACTATED RINGERS IV SOLN: INTRAVENOUS | Status: DC

## 2020-08-10 MED ORDER — METHOCARBAMOL 500 MG PO TABS
1000.0000 mg | ORAL_TABLET | Freq: Three times a day (TID) | ORAL | Status: DC
  Administered 2020-08-10 (×3): 1000 mg via ORAL
  Filled 2020-08-10 (×4): qty 2

## 2020-08-10 NOTE — ED Notes (Signed)
Pain level 8/10

## 2020-08-10 NOTE — ED Notes (Signed)
Diet tray ordered for pt 

## 2020-08-10 NOTE — ED Provider Notes (Signed)
Patient transferred from [facility] with a fall.  She has a comminuted right proximal humerus fracture and pulmonary contusions  Shoulder sling placed on arrival.  She is complaining of severe pain to her right shoulder and right knee.  Imaging of abdomen pelvis was not obtained.  Also obtain imaging of CT knee given her severe pain.  Admission discussed with Dr. Bobbye Morton who agrees with CT imaging of knee and abdomen pelvis.  She will see patient for admission.  Imaging of abdomen pelvis is negative.  CT knee shows nondisplaced to the plateau and proximal fibular fracture.  Will place knee immobilizer  D/w Helaine Chess for Dr. Griffin Basil.  She is aware of patient's admission to [facility] and will have patient evaluated by orthopedic service later today for both humerus and tibial plateau fractures.  Dr. Bobbye Morton to admit to trauma service.    Ezequiel Essex, MD 08/10/20 [phone]

## 2020-08-10 NOTE — ED Notes (Signed)
Messaged MD about diet for pt again. Pt is asking for something to drink

## 2020-08-10 NOTE — Consult Note (Signed)
Reason for Consult:Polytrauma Referring Physician: Reather Laurence Time called: [id number] Time at bedside: [place] [name] is an 70 y.o. female.  HPI: [name] was at her niece's house, missed a step, and fell. She landed hard on her right side. She had immediate shoulder and knee pain and could not get up. She was taken to [facility] where workup showed right shoulder and knee fxs in addition to other injuries. She was transferred to [facility] and orthopedic surgery was consulted. She c/o localized pain to those areas. She lives at home with her husband, does not use any assistive devices to ambulate, is RHD, and is retired.  Past Medical History:  Diagnosis Date   Anxiety    Arthritis    CAD (coronary artery disease)    Cataract    Depression    DISORDER, MENOPAUSAL NOS 03/20/2007   Qualifier: Diagnosis of  By: Arnoldo Morale MD, Balinda Quails    GERD (gastroesophageal reflux disease)    Hyperlipidemia    Hypothyroidism    MYOCARDIAL INFARCTION, HX OF 09/09/2007   Qualifier: Diagnosis of  By: Leanne Chang MD, Bruce     Osteoporosis    Polymyalgia rheumatica [facility])     Past Surgical History:  Procedure Laterality Date   CATARACT EXTRACTION     CESAREAN SECTION     CHOLECYSTECTOMY N/A 11/24/2012   Procedure: LAPAROSCOPIC CHOLECYSTECTOMY;  Surgeon: Ralene Ok, MD;  Location: [place];  Service: General;  Laterality: N/A;   COSMETIC SURGERY     EYE SURGERY     eye lids lifted   PTCA      Family History  Problem Relation Age of Onset   Hypertension Father    Heart disease Father        Mi age 60, 2, 48 and 28.   Cancer Father    Hyperlipidemia Father    Colon cancer Maternal Uncle    Hypertension Mother    Cancer Mother    Hyperlipidemia Mother    Pulmonary fibrosis Sister    Epilepsy Son    Healthy Son    Myasthenia gravis Son    Healthy Son    Rheum arthritis Niece    Lupus Niece    Rheum arthritis Niece     Social History:  reports that she has never smoked. She has never used smokeless  tobacco. She reports that she does not drink alcohol and does not use drugs.  Allergies:  Allergies  Allergen Reactions   Morphine And Related Nausea And Vomiting   Prochlorperazine     REACTION: nerve reaction   Simvastatin     REACTION: leg cramps   Sulfa Antibiotics     Medications: I have reviewed the patient's current medications.  Results for orders placed or performed during the hospital encounter of 08/09/20 (from the past 48 hour(s))  CBC with Differential     Status: Abnormal   Collection Time: 08/09/20  9:40 PM  Result Value Ref Range   WBC 14.8 (H) 4.0 - 10.5 K/uL   RBC 4.22 3.87 - 5.11 MIL/uL   Hemoglobin 13.1 12.0 - 15.0 g/dL   HCT 39.9 36.0 - 46.0 %   MCV 94.5 80.0 - 100.0 fL   MCH 31.0 26.0 - 34.0 pg   MCHC 32.8 30.0 - 36.0 g/dL   RDW 13.1 11.5 - 15.5 %   Platelets 241 150 - 400 K/uL   nRBC 0.0 0.0 - 0.2 %   Neutrophils Relative % 86 %   Neutro Abs 12.7 (  H) 1.7 - 7.7 K/uL   Lymphocytes Relative 7 %   Lymphs Abs 1.0 0.7 - 4.0 K/uL   Monocytes Relative 6 %   Monocytes Absolute 0.9 0.1 - 1.0 K/uL   Eosinophils Relative 1 %   Eosinophils Absolute 0.1 0.0 - 0.5 K/uL   Basophils Relative 0 %   Basophils Absolute 0.1 0.0 - 0.1 K/uL   Immature Granulocytes 0 %   Abs Immature Granulocytes 0.06 0.00 - 0.07 K/uL    Comment: Performed at [facility], [address] [place], [place] [address]  Comprehensive metabolic panel     Status: Abnormal   Collection Time: 08/09/20  9:40 PM  Result Value Ref Range   Sodium 136 135 - 145 mmol/L   Potassium 3.6 3.5 - 5.1 mmol/L   Chloride 104 98 - 111 mmol/L   CO2 25 22 - 32 mmol/L   Glucose, Bld 130 (H) 70 - 99 mg/dL    Comment: Glucose reference range applies only to samples taken after fasting for at least 8 hours.   BUN 17 8 - 23 mg/dL   Creatinine, Ser 1.03 (H) 0.44 - 1.00 mg/dL   Calcium 8.8 (L) 8.9 - 10.3 mg/dL   Total Protein 6.5 6.5 - 8.1 g/dL   Albumin 3.6 3.5 - 5.0 g/dL   AST 27 15 - 41 U/L   ALT  20 0 - 44 U/L   Alkaline Phosphatase 85 38 - 126 U/L   Total Bilirubin 0.9 0.3 - 1.2 mg/dL   GFR, Estimated 58 (L) >60 mL/min    Comment: (NOTE) Calculated using the CKD-EPI Creatinine Equation (2021)    Anion gap 7 5 - 15    Comment: Performed at [facility], [address] [place], [place] [address]  Resp Panel by RT-PCR (Flu A&B, Covid) Nasopharyngeal Swab     Status: None   Collection Time: 08/09/20  9:40 PM   Specimen: Nasopharyngeal Swab; Nasopharyngeal(NP) swabs in vial transport medium  Result Value Ref Range   SARS Coronavirus 2 by RT PCR NEGATIVE NEGATIVE    Comment: (NOTE) SARS-CoV-2 target nucleic acids are NOT DETECTED.  The SARS-CoV-2 RNA is generally detectable in upper respiratory specimens during the acute phase of infection. The lowest concentration of SARS-CoV-2 viral copies this assay can detect is 138 copies/mL. A negative result does not preclude SARS-Cov-2 infection and should not be used as the sole basis for treatment or other patient management decisions. A negative result may occur with  improper specimen collection/handling, submission of specimen other than nasopharyngeal swab, presence of viral mutation(s) within the areas targeted by this assay, and inadequate number of viral copies(<138 copies/mL). A negative result must be combined with clinical observations, patient history, and epidemiological information. The expected result is Negative.  Fact Sheet for Patients:  EntrepreneurPulse.com.au  Fact Sheet for Healthcare Providers:  IncredibleEmployment.be  This test is no t yet approved or cleared by the [place] FDA and  has been authorized for detection and/or diagnosis of SARS-CoV-2 by FDA under an Emergency Use Authorization (EUA). This EUA will remain  in effect (meaning this test can be used) for the duration of the COVID-19 declaration under Section 564(b)(1) of the Act,  21 U.S.C.section 360bbb-3(b)(1), unless the authorization is terminated  or revoked sooner.       Influenza A by PCR NEGATIVE NEGATIVE   Influenza B by PCR NEGATIVE NEGATIVE    Comment: (NOTE) The Xpert Xpress SARS-CoV-2/FLU/RSV plus assay is  intended as an aid in the diagnosis of influenza from Nasopharyngeal swab specimens and should not be used as a sole basis for treatment. Nasal washings and aspirates are unacceptable for Xpert Xpress SARS-CoV-2/FLU/RSV testing.  Fact Sheet for Patients: EntrepreneurPulse.com.au  Fact Sheet for Healthcare Providers: IncredibleEmployment.be  This test is not yet approved or cleared by the [place] FDA and has been authorized for detection and/or diagnosis of SARS-CoV-2 by FDA under an Emergency Use Authorization (EUA). This EUA will remain in effect (meaning this test can be used) for the duration of the COVID-19 declaration under Section 564(b)(1) of the Act, 21 U.S.C. section 360bbb-3(b)(1), unless the authorization is terminated or revoked.  Performed at [facility], [address] [place], [place] [address]     CT ABDOMEN PELVIS WO CONTRAST  Result Date: 08/10/2020 CLINICAL DATA:  Abdominal trauma, fall EXAM: CT ABDOMEN AND PELVIS WITHOUT CONTRAST TECHNIQUE: Multidetector CT imaging of the abdomen and pelvis was performed following the standard protocol without IV contrast. COMPARISON:  05/16/2018 FINDINGS: Lower chest: Bibasilar atelectasis.  No effusions. Hepatobiliary: No focal hepatic abnormality. Gallbladder unremarkable. No hepatic injury or perihepatic hematoma. Pancreas: No focal abnormality or ductal dilatation. Spleen: No splenic injury or perisplenic hematoma. Adrenals/Urinary Tract: No adrenal hemorrhage or renal injury identified. Bladder is unremarkable. Stomach/Bowel: Stomach, large and small bowel grossly unremarkable. Vascular/Lymphatic: No evidence of aneurysm or  adenopathy. Aortic atherosclerosis. Reproductive: Uterus and adnexa unremarkable.  No mass. Other: No free fluid or free air. Musculoskeletal: Moderate compression fracture at L1, stable since prior study. No acute bony abnormality. IMPRESSION: No evidence of significant traumatic injury in the abdomen or pelvis. No acute findings. Electronically Signed   By: Rolm Baptise M.D.   On: 08/10/2020 03:16   CT Head Wo Contrast  Result Date: 08/09/2020 CLINICAL DATA:  Head trauma EXAM: CT HEAD WITHOUT CONTRAST CT MAXILLOFACIAL WITHOUT CONTRAST CT CERVICAL SPINE WITHOUT CONTRAST TECHNIQUE: Multidetector CT imaging of the head, cervical spine, and maxillofacial structures were performed using the standard protocol without intravenous contrast. Multiplanar CT image reconstructions of the cervical spine and maxillofacial structures were also generated. COMPARISON:  None. FINDINGS: CT HEAD FINDINGS Brain: No acute territorial infarction, hemorrhage, or intracranial mass. Encephalomalacia within the inferior left cerebellar hemisphere consistent with chronic infarct. Nonenlarged ventricles. Mild atrophy Vascular: No hyperdense vessels. Vertebral and carotid vascular calcification. Skull: No depressed skull fracture Other: None CT MAXILLOFACIAL FINDINGS Osseous: Mandibular heads are normally position. No mandibular fracture. Mastoid air cells are clear. Pterygoid plates and zygomatic arches are intact. Acute bilateral mildly depressed nasal bone fractures. Orbits: Negative. No traumatic or inflammatory finding. Sinuses: Fluid level left maxillary sinus without definitive sinus wall fracture. Small fluid levels in the posterior ethmoid sinuses. Soft tissues: Swelling over the nasal region and left forehead. CT CERVICAL SPINE FINDINGS Alignment: Within normal limits.  Facet alignment is normal Skull base and vertebrae: No acute fracture. No primary bone lesion or focal pathologic process. Soft tissues and spinal canal: No  prevertebral fluid or swelling. No visible canal hematoma. Disc levels:  Disc spaces are patent. Upper chest: Negative. Other: None IMPRESSION: 1. No CT evidence for acute intracranial abnormality. Chronic left cerebellar infarct and mild atrophy 2. No CT evidence for acute osseous abnormality of the cervical spine 3. Acute bilateral mildly displaced nasal bone fracture. Fluid levels in left maxillary and posterior ethmoid sinuses but without definitive sinus fracture identified. Electronically Signed   By: Donavan Foil M.D.   On: 08/09/2020 20:11  CT Chest W Contrast  Result Date: 08/09/2020 CLINICAL DATA:  Chest trauma, hypoxia EXAM: CT CHEST WITH CONTRAST TECHNIQUE: Multidetector CT imaging of the chest was performed during intravenous contrast administration. CONTRAST:  75mL OMNIPAQUE IOHEXOL 300 MG/ML  SOLN COMPARISON:  None. FINDINGS: Cardiovascular: Heart is normal size. Aorta normal caliber. Scattered aortic calcifications. Mediastinum/Nodes: No mediastinal, hilar, or axillary adenopathy. Trachea and esophagus are unremarkable. Thyroid unremarkable. Lungs/Pleura: Lungs are clear. No focal airspace opacities or suspicious nodules. No effusions. No pneumothorax Upper Abdomen: Imaging into the upper abdomen demonstrates no acute findings. Musculoskeletal: Chest wall soft tissues are unremarkable. Comminuted proximal right humeral fracture noted. IMPRESSION: Comminuted proximal right humeral fracture. No acute cardiopulmonary disease. Aortic Atherosclerosis (ICD10-I70.0). Electronically Signed   By: Rolm Baptise M.D.   On: 08/09/2020 23:09   CT Cervical Spine Wo Contrast  Result Date: 08/09/2020 CLINICAL DATA:  Head trauma EXAM: CT HEAD WITHOUT CONTRAST CT MAXILLOFACIAL WITHOUT CONTRAST CT CERVICAL SPINE WITHOUT CONTRAST TECHNIQUE: Multidetector CT imaging of the head, cervical spine, and maxillofacial structures were performed using the standard protocol without intravenous contrast. Multiplanar CT  image reconstructions of the cervical spine and maxillofacial structures were also generated. COMPARISON:  None. FINDINGS: CT HEAD FINDINGS Brain: No acute territorial infarction, hemorrhage, or intracranial mass. Encephalomalacia within the inferior left cerebellar hemisphere consistent with chronic infarct. Nonenlarged ventricles. Mild atrophy Vascular: No hyperdense vessels. Vertebral and carotid vascular calcification. Skull: No depressed skull fracture Other: None CT MAXILLOFACIAL FINDINGS Osseous: Mandibular heads are normally position. No mandibular fracture. Mastoid air cells are clear. Pterygoid plates and zygomatic arches are intact. Acute bilateral mildly depressed nasal bone fractures. Orbits: Negative. No traumatic or inflammatory finding. Sinuses: Fluid level left maxillary sinus without definitive sinus wall fracture. Small fluid levels in the posterior ethmoid sinuses. Soft tissues: Swelling over the nasal region and left forehead. CT CERVICAL SPINE FINDINGS Alignment: Within normal limits.  Facet alignment is normal Skull base and vertebrae: No acute fracture. No primary bone lesion or focal pathologic process. Soft tissues and spinal canal: No prevertebral fluid or swelling. No visible canal hematoma. Disc levels:  Disc spaces are patent. Upper chest: Negative. Other: None IMPRESSION: 1. No CT evidence for acute intracranial abnormality. Chronic left cerebellar infarct and mild atrophy 2. No CT evidence for acute osseous abnormality of the cervical spine 3. Acute bilateral mildly displaced nasal bone fracture. Fluid levels in left maxillary and posterior ethmoid sinuses but without definitive sinus fracture identified. Electronically Signed   By: Donavan Foil M.D.   On: 08/09/2020 20:11   CT Knee Right Wo Contrast  Result Date: 08/10/2020 CLINICAL DATA:  Fall, right knee pain EXAM: CT OF THE RIGHT KNEE WITHOUT CONTRAST TECHNIQUE: Multidetector CT imaging of the RIGHT knee was performed  according to the standard protocol. Multiplanar CT image reconstructions were also generated. COMPARISON:  None. FINDINGS: Bones/Joint/Cartilage There is a minimally displaced lateral tibial plateau fracture with fracture fragments in anatomic alignment and no associated articular incongruity. This is best appreciated on coronal image # [id number] 5/6. A minimally displaced anatomic aligned intra-articular fracture of the proximal fibula is also identified, best appreciated on axial image # 80/3 and coronal image # 43/6. The patella and visualized distal femur are intact. There is moderate tricompartmental degenerative arthritis of the right knee with asymmetric joint space narrowing. Ligaments Suboptimally assessed by CT. Muscles and Tendons Normal muscle bulk.  Quadriceps and patellar tendons are intact. Soft tissues Small lipohemarthrosis present. IMPRESSION: Minimally displaced, anatomically aligned lateral tibial plateau fracture  and intra-articular fracture of the proximal fibula. Small associated lipohemarthrosis. No articular incongruity. Moderate tricompartmental degenerative arthritis. Electronically Signed   By: Fidela Salisbury MD   On: 08/10/2020 03:24   CT Shoulder Right Wo Contrast  Result Date: 08/09/2020 CLINICAL DATA:  Shoulder trauma EXAM: CT OF THE UPPER RIGHT EXTREMITY WITHOUT CONTRAST TECHNIQUE: Multidetector CT imaging of the upper right extremity was performed according to the standard protocol. COMPARISON:  Plain films earlier today FINDINGS: Bones/Joint/Cartilage Highly comminuted fracture noted through the proximal right humerus involving the humeral neck and proximal shaft and the greater tuberosity region. Significant displacement of multiple fracture fragments. No subluxation or dislocation. Ligaments Suboptimally assessed by CT. Muscles and Tendons Probable hematoma within the right deltoid muscle and pectoralis muscle. Soft tissues Intact IMPRESSION: Highly comminuted displaced proximal  right humeral fracture involving the greater tuberosity, humeral neck and proximal shaft. Probable surrounding intramuscular hematoma. Electronically Signed   By: Rolm Baptise M.D.   On: 08/09/2020 23:12   DG Shoulder Right Portable  Result Date: 08/09/2020 CLINICAL DATA:  Fall, shoulder pain EXAM: PORTABLE RIGHT SHOULDER COMPARISON:  None. FINDINGS: Comminuted proximal right humeral fracture involving the humeral neck and greater tuberosity region of the humeral head. No subluxation or dislocation. Significantly displaced fracture fragments. IMPRESSION: Displaced, comminuted proximal right humeral fracture. Electronically Signed   By: Rolm Baptise M.D.   On: 08/09/2020 19:45   DG Knee Complete 4 Views Right  Result Date: 08/09/2020 CLINICAL DATA:  Fall, knee pain EXAM: RIGHT KNEE - COMPLETE 4+ VIEW COMPARISON:  None. FINDINGS: No acute bony abnormality. Specifically, no fracture, subluxation, or dislocation. Joint spaces maintained. Small joint effusion. IMPRESSION: No acute bony abnormality.  Small joint effusion. Electronically Signed   By: Rolm Baptise M.D.   On: 08/09/2020 19:46   CT Maxillofacial Wo Contrast  Result Date: 08/09/2020 CLINICAL DATA:  Head trauma EXAM: CT HEAD WITHOUT CONTRAST CT MAXILLOFACIAL WITHOUT CONTRAST CT CERVICAL SPINE WITHOUT CONTRAST TECHNIQUE: Multidetector CT imaging of the head, cervical spine, and maxillofacial structures were performed using the standard protocol without intravenous contrast. Multiplanar CT image reconstructions of the cervical spine and maxillofacial structures were also generated. COMPARISON:  None. FINDINGS: CT HEAD FINDINGS Brain: No acute territorial infarction, hemorrhage, or intracranial mass. Encephalomalacia within the inferior left cerebellar hemisphere consistent with chronic infarct. Nonenlarged ventricles. Mild atrophy Vascular: No hyperdense vessels. Vertebral and carotid vascular calcification. Skull: No depressed skull fracture Other:  None CT MAXILLOFACIAL FINDINGS Osseous: Mandibular heads are normally position. No mandibular fracture. Mastoid air cells are clear. Pterygoid plates and zygomatic arches are intact. Acute bilateral mildly depressed nasal bone fractures. Orbits: Negative. No traumatic or inflammatory finding. Sinuses: Fluid level left maxillary sinus without definitive sinus wall fracture. Small fluid levels in the posterior ethmoid sinuses. Soft tissues: Swelling over the nasal region and left forehead. CT CERVICAL SPINE FINDINGS Alignment: Within normal limits.  Facet alignment is normal Skull base and vertebrae: No acute fracture. No primary bone lesion or focal pathologic process. Soft tissues and spinal canal: No prevertebral fluid or swelling. No visible canal hematoma. Disc levels:  Disc spaces are patent. Upper chest: Negative. Other: None IMPRESSION: 1. No CT evidence for acute intracranial abnormality. Chronic left cerebellar infarct and mild atrophy 2. No CT evidence for acute osseous abnormality of the cervical spine 3. Acute bilateral mildly displaced nasal bone fracture. Fluid levels in left maxillary and posterior ethmoid sinuses but without definitive sinus fracture identified. Electronically Signed   By: Madie Reno.D.  On: 08/09/2020 20:11    Review of Systems  HENT:  Negative for ear discharge, ear pain, hearing loss and tinnitus.   Eyes:  Negative for photophobia and pain.  Respiratory:  Negative for cough and shortness of breath.   Cardiovascular:  Negative for chest pain.  Gastrointestinal:  Negative for abdominal pain, nausea and vomiting.  Genitourinary:  Negative for dysuria, flank pain, frequency and urgency.  Musculoskeletal:  Positive for arthralgias (Right shoulder/knee). Negative for back pain, myalgias and neck pain.  Neurological:  Negative for dizziness and headaches.  Hematological:  Does not bruise/bleed easily.  Psychiatric/Behavioral:  The patient is not nervous/anxious.   Blood  pressure 127/70, pulse 73, temperature 97.9 F (36.6 C), temperature source Oral, resp. rate 16, height 5\' 2"  (1.575 m), weight 72.6 kg, SpO2 97 %. Physical Exam Constitutional:      General: She is not in acute distress.    Appearance: She is well-developed. She is not diaphoretic.  HENT:     Head: Normocephalic.  Eyes:     General: No scleral icterus.       Right eye: No discharge.        Left eye: No discharge.     Conjunctiva/sclera: Conjunctivae normal.  Cardiovascular:     Rate and Rhythm: Normal rate and regular rhythm.  Pulmonary:     Effort: Pulmonary effort is normal. No respiratory distress.  Musculoskeletal:     Cervical back: Normal range of motion.     Comments: Right shoulder, elbow, wrist, digits- no skin wounds, severe TTP shoulder, no instability, no blocks to motion  Sens  Ax/R/M/U intact  Mot   Ax/ R/ PIN/ M/ AIN/ U intact  Rad 2+  RLE No traumatic wounds, ecchymosis, or rash  Mod TTP knee, compartments soft  No knee or ankle effusion  Sens DPN, SPN, TN intact  Motor EHL, ext, flex, evers 5/5  DP 1+, PT 2+, No significant edema  Skin:    General: Skin is warm and dry.  Neurological:     Mental Status: She is alert.  Psychiatric:        Mood and Affect: Mood normal.        Behavior: Behavior normal.    Assessment/Plan: Right shoulder fx -- Will need reverse arthroplasty Right tibia plateau fx -- Plan non-operative management with KI, WBAT Facial fxs Multiple medical problems including thyroid disease, GERD, and depression    Lisette Abu, PA-C Orthopedic Surgery [phone] 08/10/2020, 9:34 AM

## 2020-08-10 NOTE — Progress Notes (Signed)
Patient arrived to room 6N15 via stretcher from ED. Patient is pleading with [place] not to move her. She is in great distress and was given IV dilaudid just prior to transitioning from the ED. Patient gingerly transferred to our bed with 5 person support. Patient is alert and oriented times 4. VSS. Call bell is within reach. Patient's clothing was removed with scissors in the ED and her underwear here on the unit. Patient is agreeable to using a purewick. Patient refuses to let us roll her or manipulate her right side. I contacted Dr Georgette Dover for an additional dose of pain medication, he was off-going and I contacted Dr Lanny Hurst. One time dose of dilaudid given. Patient seems more comfortable now. Patient's spouse and friend are at bedside.

## 2020-08-10 NOTE — Progress Notes (Signed)
Orthopedic Tech Progress Note Patient Details:  [name] 1950-11-29 [id number]  Patient ID: [name], female   DOB: [birth year], 70 y.o.   MRN: [id number] Pt refused sling [name] 08/10/2020, 7:08 AM

## 2020-08-10 NOTE — ED Notes (Signed)
Messaged MD about a diet for pt.

## 2020-08-10 NOTE — H&P (Signed)
Reason for Consult/Chief Complaint: humeral neck fracture Consultant: Tegeler, MD  [name] is an 70 y.o. female.   HPI: 16F s/p mechanical GLF after tripping. Landed on her right side, hit her head, no LOC.   Past Medical History:  Diagnosis Date   Anxiety    Arthritis    CAD (coronary artery disease)    Cataract    Depression    DISORDER, MENOPAUSAL NOS 03/20/2007   Qualifier: Diagnosis of  By: Arnoldo Morale MD, Balinda Quails    GERD (gastroesophageal reflux disease)    Hyperlipidemia    Hypothyroidism    MYOCARDIAL INFARCTION, HX OF 09/09/2007   Qualifier: Diagnosis of  By: Leanne Chang MD, Bruce     Osteoporosis    Polymyalgia rheumatica [facility])     Past Surgical History:  Procedure Laterality Date   CATARACT EXTRACTION     CESAREAN SECTION     CHOLECYSTECTOMY N/A 11/24/2012   Procedure: LAPAROSCOPIC CHOLECYSTECTOMY;  Surgeon: Ralene Ok, MD;  Location: [place];  Service: General;  Laterality: N/A;   COSMETIC SURGERY     EYE SURGERY     eye lids lifted   PTCA      Family History  Problem Relation Age of Onset   Hypertension Father    Heart disease Father        Mi age 35, 67, 44 and 28.   Cancer Father    Hyperlipidemia Father    Colon cancer Maternal Uncle    Hypertension Mother    Cancer Mother    Hyperlipidemia Mother    Pulmonary fibrosis Sister    Epilepsy Son    Healthy Son    Myasthenia gravis Son    Healthy Son    Rheum arthritis Niece    Lupus Niece    Rheum arthritis Niece     Social History:  reports that she has never smoked. She has never used smokeless tobacco. She reports that she does not drink alcohol and does not use drugs.  Allergies:  Allergies  Allergen Reactions   Morphine And Related Nausea And Vomiting   Prochlorperazine     REACTION: nerve reaction   Simvastatin     REACTION: leg cramps   Sulfa Antibiotics     Medications: I have reviewed the patient's current medications.  Results for orders placed or performed during the  hospital encounter of 08/09/20 (from the past 48 hour(s))  CBC with Differential     Status: Abnormal   Collection Time: 08/09/20  9:40 PM  Result Value Ref Range   WBC 14.8 (H) 4.0 - 10.5 K/uL   RBC 4.22 3.87 - 5.11 MIL/uL   Hemoglobin 13.1 12.0 - 15.0 g/dL   HCT 39.9 36.0 - 46.0 %   MCV 94.5 80.0 - 100.0 fL   MCH 31.0 26.0 - 34.0 pg   MCHC 32.8 30.0 - 36.0 g/dL   RDW 13.1 11.5 - 15.5 %   Platelets 241 150 - 400 K/uL   nRBC 0.0 0.0 - 0.2 %   Neutrophils Relative % 86 %   Neutro Abs 12.7 (H) 1.7 - 7.7 K/uL   Lymphocytes Relative 7 %   Lymphs Abs 1.0 0.7 - 4.0 K/uL   Monocytes Relative 6 %   Monocytes Absolute 0.9 0.1 - 1.0 K/uL   Eosinophils Relative 1 %   Eosinophils Absolute 0.1 0.0 - 0.5 K/uL   Basophils Relative 0 %   Basophils Absolute 0.1 0.0 - 0.1 K/uL   Immature Granulocytes 0 %  Abs Immature Granulocytes 0.06 0.00 - 0.07 K/uL    Comment: Performed at [facility], [address] [place], [place] [address]  Comprehensive metabolic panel     Status: Abnormal   Collection Time: 08/09/20  9:40 PM  Result Value Ref Range   Sodium 136 135 - 145 mmol/L   Potassium 3.6 3.5 - 5.1 mmol/L   Chloride 104 98 - 111 mmol/L   CO2 25 22 - 32 mmol/L   Glucose, Bld 130 (H) 70 - 99 mg/dL    Comment: Glucose reference range applies only to samples taken after fasting for at least 8 hours.   BUN 17 8 - 23 mg/dL   Creatinine, Ser 1.03 (H) 0.44 - 1.00 mg/dL   Calcium 8.8 (L) 8.9 - 10.3 mg/dL   Total Protein 6.5 6.5 - 8.1 g/dL   Albumin 3.6 3.5 - 5.0 g/dL   AST 27 15 - 41 U/L   ALT 20 0 - 44 U/L   Alkaline Phosphatase 85 38 - 126 U/L   Total Bilirubin 0.9 0.3 - 1.2 mg/dL   GFR, Estimated 58 (L) >60 mL/min    Comment: (NOTE) Calculated using the CKD-EPI Creatinine Equation (2021)    Anion gap 7 5 - 15    Comment: Performed at [facility], [address] [place], [place] [address]  Resp Panel by RT-PCR (Flu A&B, Covid) Nasopharyngeal Swab     Status: None    Collection Time: 08/09/20  9:40 PM   Specimen: Nasopharyngeal Swab; Nasopharyngeal(NP) swabs in vial transport medium  Result Value Ref Range   SARS Coronavirus 2 by RT PCR NEGATIVE NEGATIVE    Comment: (NOTE) SARS-CoV-2 target nucleic acids are NOT DETECTED.  The SARS-CoV-2 RNA is generally detectable in upper respiratory specimens during the acute phase of infection. The lowest concentration of SARS-CoV-2 viral copies this assay can detect is 138 copies/mL. A negative result does not preclude SARS-Cov-2 infection and should not be used as the sole basis for treatment or other patient management decisions. A negative result may occur with  improper specimen collection/handling, submission of specimen other than nasopharyngeal swab, presence of viral mutation(s) within the areas targeted by this assay, and inadequate number of viral copies(<138 copies/mL). A negative result must be combined with clinical observations, patient history, and epidemiological information. The expected result is Negative.  Fact Sheet for Patients:  EntrepreneurPulse.com.au  Fact Sheet for Healthcare Providers:  IncredibleEmployment.be  This test is no t yet approved or cleared by the [place] FDA and  has been authorized for detection and/or diagnosis of SARS-CoV-2 by FDA under an Emergency Use Authorization (EUA). This EUA will remain  in effect (meaning this test can be used) for the duration of the COVID-19 declaration under Section 564(b)(1) of the Act, 21 U.S.C.section 360bbb-3(b)(1), unless the authorization is terminated  or revoked sooner.       Influenza A by PCR NEGATIVE NEGATIVE   Influenza B by PCR NEGATIVE NEGATIVE    Comment: (NOTE) The Xpert Xpress SARS-CoV-2/FLU/RSV plus assay is intended as an aid in the diagnosis of influenza from Nasopharyngeal swab specimens and should not be used as a sole basis for treatment. Nasal washings  and aspirates are unacceptable for Xpert Xpress SARS-CoV-2/FLU/RSV testing.  Fact Sheet for Patients: EntrepreneurPulse.com.au  Fact Sheet for Healthcare Providers: IncredibleEmployment.be  This test is not yet approved or cleared by the [place] FDA and has been authorized for detection and/or diagnosis of SARS-CoV-2 by  FDA under an Emergency Use Authorization (EUA). This EUA will remain in effect (meaning this test can be used) for the duration of the COVID-19 declaration under Section 564(b)(1) of the Act, 21 U.S.C. section 360bbb-3(b)(1), unless the authorization is terminated or revoked.  Performed at [facility], [address] [place], [place] [address]     CT ABDOMEN PELVIS WO CONTRAST  Result Date: 08/10/2020 CLINICAL DATA:  Abdominal trauma, fall EXAM: CT ABDOMEN AND PELVIS WITHOUT CONTRAST TECHNIQUE: Multidetector CT imaging of the abdomen and pelvis was performed following the standard protocol without IV contrast. COMPARISON:  05/16/2018 FINDINGS: Lower chest: Bibasilar atelectasis.  No effusions. Hepatobiliary: No focal hepatic abnormality. Gallbladder unremarkable. No hepatic injury or perihepatic hematoma. Pancreas: No focal abnormality or ductal dilatation. Spleen: No splenic injury or perisplenic hematoma. Adrenals/Urinary Tract: No adrenal hemorrhage or renal injury identified. Bladder is unremarkable. Stomach/Bowel: Stomach, large and small bowel grossly unremarkable. Vascular/Lymphatic: No evidence of aneurysm or adenopathy. Aortic atherosclerosis. Reproductive: Uterus and adnexa unremarkable.  No mass. Other: No free fluid or free air. Musculoskeletal: Moderate compression fracture at L1, stable since prior study. No acute bony abnormality. IMPRESSION: No evidence of significant traumatic injury in the abdomen or pelvis. No acute findings. Electronically Signed   By: Rolm Baptise M.D.   On: 08/10/2020 03:16   CT  Head Wo Contrast  Result Date: 08/09/2020 CLINICAL DATA:  Head trauma EXAM: CT HEAD WITHOUT CONTRAST CT MAXILLOFACIAL WITHOUT CONTRAST CT CERVICAL SPINE WITHOUT CONTRAST TECHNIQUE: Multidetector CT imaging of the head, cervical spine, and maxillofacial structures were performed using the standard protocol without intravenous contrast. Multiplanar CT image reconstructions of the cervical spine and maxillofacial structures were also generated. COMPARISON:  None. FINDINGS: CT HEAD FINDINGS Brain: No acute territorial infarction, hemorrhage, or intracranial mass. Encephalomalacia within the inferior left cerebellar hemisphere consistent with chronic infarct. Nonenlarged ventricles. Mild atrophy Vascular: No hyperdense vessels. Vertebral and carotid vascular calcification. Skull: No depressed skull fracture Other: None CT MAXILLOFACIAL FINDINGS Osseous: Mandibular heads are normally position. No mandibular fracture. Mastoid air cells are clear. Pterygoid plates and zygomatic arches are intact. Acute bilateral mildly depressed nasal bone fractures. Orbits: Negative. No traumatic or inflammatory finding. Sinuses: Fluid level left maxillary sinus without definitive sinus wall fracture. Small fluid levels in the posterior ethmoid sinuses. Soft tissues: Swelling over the nasal region and left forehead. CT CERVICAL SPINE FINDINGS Alignment: Within normal limits.  Facet alignment is normal Skull base and vertebrae: No acute fracture. No primary bone lesion or focal pathologic process. Soft tissues and spinal canal: No prevertebral fluid or swelling. No visible canal hematoma. Disc levels:  Disc spaces are patent. Upper chest: Negative. Other: None IMPRESSION: 1. No CT evidence for acute intracranial abnormality. Chronic left cerebellar infarct and mild atrophy 2. No CT evidence for acute osseous abnormality of the cervical spine 3. Acute bilateral mildly displaced nasal bone fracture. Fluid levels in left maxillary and  posterior ethmoid sinuses but without definitive sinus fracture identified. Electronically Signed   By: Donavan Foil M.D.   On: 08/09/2020 20:11   CT Chest W Contrast  Result Date: 08/09/2020 CLINICAL DATA:  Chest trauma, hypoxia EXAM: CT CHEST WITH CONTRAST TECHNIQUE: Multidetector CT imaging of the chest was performed during intravenous contrast administration. CONTRAST:  26mL OMNIPAQUE IOHEXOL 300 MG/ML  SOLN COMPARISON:  None. FINDINGS: Cardiovascular: Heart is normal size. Aorta normal caliber. Scattered aortic calcifications. Mediastinum/Nodes: No mediastinal, hilar, or axillary adenopathy. Trachea and esophagus are unremarkable. Thyroid unremarkable. Lungs/Pleura: Lungs  are clear. No focal airspace opacities or suspicious nodules. No effusions. No pneumothorax Upper Abdomen: Imaging into the upper abdomen demonstrates no acute findings. Musculoskeletal: Chest wall soft tissues are unremarkable. Comminuted proximal right humeral fracture noted. IMPRESSION: Comminuted proximal right humeral fracture. No acute cardiopulmonary disease. Aortic Atherosclerosis (ICD10-I70.0). Electronically Signed   By: Rolm Baptise M.D.   On: 08/09/2020 23:09   CT Cervical Spine Wo Contrast  Result Date: 08/09/2020 CLINICAL DATA:  Head trauma EXAM: CT HEAD WITHOUT CONTRAST CT MAXILLOFACIAL WITHOUT CONTRAST CT CERVICAL SPINE WITHOUT CONTRAST TECHNIQUE: Multidetector CT imaging of the head, cervical spine, and maxillofacial structures were performed using the standard protocol without intravenous contrast. Multiplanar CT image reconstructions of the cervical spine and maxillofacial structures were also generated. COMPARISON:  None. FINDINGS: CT HEAD FINDINGS Brain: No acute territorial infarction, hemorrhage, or intracranial mass. Encephalomalacia within the inferior left cerebellar hemisphere consistent with chronic infarct. Nonenlarged ventricles. Mild atrophy Vascular: No hyperdense vessels. Vertebral and carotid vascular  calcification. Skull: No depressed skull fracture Other: None CT MAXILLOFACIAL FINDINGS Osseous: Mandibular heads are normally position. No mandibular fracture. Mastoid air cells are clear. Pterygoid plates and zygomatic arches are intact. Acute bilateral mildly depressed nasal bone fractures. Orbits: Negative. No traumatic or inflammatory finding. Sinuses: Fluid level left maxillary sinus without definitive sinus wall fracture. Small fluid levels in the posterior ethmoid sinuses. Soft tissues: Swelling over the nasal region and left forehead. CT CERVICAL SPINE FINDINGS Alignment: Within normal limits.  Facet alignment is normal Skull base and vertebrae: No acute fracture. No primary bone lesion or focal pathologic process. Soft tissues and spinal canal: No prevertebral fluid or swelling. No visible canal hematoma. Disc levels:  Disc spaces are patent. Upper chest: Negative. Other: None IMPRESSION: 1. No CT evidence for acute intracranial abnormality. Chronic left cerebellar infarct and mild atrophy 2. No CT evidence for acute osseous abnormality of the cervical spine 3. Acute bilateral mildly displaced nasal bone fracture. Fluid levels in left maxillary and posterior ethmoid sinuses but without definitive sinus fracture identified. Electronically Signed   By: Donavan Foil M.D.   On: 08/09/2020 20:11   CT Knee Right Wo Contrast  Result Date: 08/10/2020 CLINICAL DATA:  Fall, right knee pain EXAM: CT OF THE RIGHT KNEE WITHOUT CONTRAST TECHNIQUE: Multidetector CT imaging of the RIGHT knee was performed according to the standard protocol. Multiplanar CT image reconstructions were also generated. COMPARISON:  None. FINDINGS: Bones/Joint/Cartilage There is a minimally displaced lateral tibial plateau fracture with fracture fragments in anatomic alignment and no associated articular incongruity. This is best appreciated on coronal image # 44-40 5/6. A minimally displaced anatomic aligned intra-articular fracture of  the proximal fibula is also identified, best appreciated on axial image # 80/3 and coronal image # 43/6. The patella and visualized distal femur are intact. There is moderate tricompartmental degenerative arthritis of the right knee with asymmetric joint space narrowing. Ligaments Suboptimally assessed by CT. Muscles and Tendons Normal muscle bulk.  Quadriceps and patellar tendons are intact. Soft tissues Small lipohemarthrosis present. IMPRESSION: Minimally displaced, anatomically aligned lateral tibial plateau fracture and intra-articular fracture of the proximal fibula. Small associated lipohemarthrosis. No articular incongruity. Moderate tricompartmental degenerative arthritis. Electronically Signed   By: Fidela Salisbury MD   On: 08/10/2020 03:24   CT Shoulder Right Wo Contrast  Result Date: 08/09/2020 CLINICAL DATA:  Shoulder trauma EXAM: CT OF THE UPPER RIGHT EXTREMITY WITHOUT CONTRAST TECHNIQUE: Multidetector CT imaging of the upper right extremity was performed according to the standard protocol.  COMPARISON:  Plain films earlier today FINDINGS: Bones/Joint/Cartilage Highly comminuted fracture noted through the proximal right humerus involving the humeral neck and proximal shaft and the greater tuberosity region. Significant displacement of multiple fracture fragments. No subluxation or dislocation. Ligaments Suboptimally assessed by CT. Muscles and Tendons Probable hematoma within the right deltoid muscle and pectoralis muscle. Soft tissues Intact IMPRESSION: Highly comminuted displaced proximal right humeral fracture involving the greater tuberosity, humeral neck and proximal shaft. Probable surrounding intramuscular hematoma. Electronically Signed   By: Rolm Baptise M.D.   On: 08/09/2020 23:12   DG Shoulder Right Portable  Result Date: 08/09/2020 CLINICAL DATA:  Fall, shoulder pain EXAM: PORTABLE RIGHT SHOULDER COMPARISON:  None. FINDINGS: Comminuted proximal right humeral fracture involving the  humeral neck and greater tuberosity region of the humeral head. No subluxation or dislocation. Significantly displaced fracture fragments. IMPRESSION: Displaced, comminuted proximal right humeral fracture. Electronically Signed   By: Rolm Baptise M.D.   On: 08/09/2020 19:45   DG Knee Complete 4 Views Right  Result Date: 08/09/2020 CLINICAL DATA:  Fall, knee pain EXAM: RIGHT KNEE - COMPLETE 4+ VIEW COMPARISON:  None. FINDINGS: No acute bony abnormality. Specifically, no fracture, subluxation, or dislocation. Joint spaces maintained. Small joint effusion. IMPRESSION: No acute bony abnormality.  Small joint effusion. Electronically Signed   By: Rolm Baptise M.D.   On: 08/09/2020 19:46   CT Maxillofacial Wo Contrast  Result Date: 08/09/2020 CLINICAL DATA:  Head trauma EXAM: CT HEAD WITHOUT CONTRAST CT MAXILLOFACIAL WITHOUT CONTRAST CT CERVICAL SPINE WITHOUT CONTRAST TECHNIQUE: Multidetector CT imaging of the head, cervical spine, and maxillofacial structures were performed using the standard protocol without intravenous contrast. Multiplanar CT image reconstructions of the cervical spine and maxillofacial structures were also generated. COMPARISON:  None. FINDINGS: CT HEAD FINDINGS Brain: No acute territorial infarction, hemorrhage, or intracranial mass. Encephalomalacia within the inferior left cerebellar hemisphere consistent with chronic infarct. Nonenlarged ventricles. Mild atrophy Vascular: No hyperdense vessels. Vertebral and carotid vascular calcification. Skull: No depressed skull fracture Other: None CT MAXILLOFACIAL FINDINGS Osseous: Mandibular heads are normally position. No mandibular fracture. Mastoid air cells are clear. Pterygoid plates and zygomatic arches are intact. Acute bilateral mildly depressed nasal bone fractures. Orbits: Negative. No traumatic or inflammatory finding. Sinuses: Fluid level left maxillary sinus without definitive sinus wall fracture. Small fluid levels in the posterior  ethmoid sinuses. Soft tissues: Swelling over the nasal region and left forehead. CT CERVICAL SPINE FINDINGS Alignment: Within normal limits.  Facet alignment is normal Skull base and vertebrae: No acute fracture. No primary bone lesion or focal pathologic process. Soft tissues and spinal canal: No prevertebral fluid or swelling. No visible canal hematoma. Disc levels:  Disc spaces are patent. Upper chest: Negative. Other: None IMPRESSION: 1. No CT evidence for acute intracranial abnormality. Chronic left cerebellar infarct and mild atrophy 2. No CT evidence for acute osseous abnormality of the cervical spine 3. Acute bilateral mildly displaced nasal bone fracture. Fluid levels in left maxillary and posterior ethmoid sinuses but without definitive sinus fracture identified. Electronically Signed   By: Donavan Foil M.D.   On: 08/09/2020 20:11    ROS 10 point review of systems is negative except as listed above in HPI.   Physical Exam Blood pressure (!) 151/73, pulse 71, temperature 97.9 F (36.6 C), temperature source Oral, resp. rate 11, height 5\' 2"  (1.575 m), weight 72.6 kg, SpO2 98 %. Constitutional: well-developed, well-nourished HEENT: pupils equal, round, reactive to light, 78mm b/l, moist conjunctiva, external inspection of ears and  nose normal, hearing intact Oropharynx: normal oropharyngeal mucosa, poor dentition Neck: no thyromegaly, trachea midline, no midline cervical tenderness to palpation Chest: breath sounds equal bilaterally, normal respiratory effort, no midline or lateral chest wall tenderness to palpation/deformity Abdomen: soft, NT, no bruising, no hepatosplenomegaly GU: normal female genitalia  Back: no wounds, no thoracic/lumbar spine tenderness to palpation, no thoracic/lumbar spine stepoffs Rectal: deferred Extremities: 2+ radial and pedal pulses bilaterally, motor and sensation intact to bilateral UE and LE, no peripheral edema MSK: unable to assess gait/station, no  clubbing/cyanosis of fingers/toes, limited ROM of RUE/RLE 2/2 pain  Skin: warm, dry, no rashes Psych: normal memory, normal mood/affect    Assessment/Plan: GLF  Comminuted R humerus fx - ortho c/s, likely operative R tibial plateau frx - ortho c/s, await recs R fibula fx - ortho c/s, await recs B/l nasal bone frx - f/u as outpatient with ENT FEN - NPO for surgery, LR @100  DVT - SCD to LLE, hold chemical DVT ppx for now Dispo - admit to inpatient, progressive   Jesusita Oka, MD General and [facility]

## 2020-08-10 NOTE — ED Notes (Signed)
Patient transported to CT 

## 2020-08-10 NOTE — Progress Notes (Signed)
Orthopedic Tech Progress Note Patient Details:  [name] [birth year] [id number]  Patient would not allow me to touch that shoulder until she had better pain meds.   Patient ID: [name], female   DOB: Dec 29, 1950, 70 y.o.   MRN: [id number]  [name] 08/10/2020, 2:18 PM

## 2020-08-10 NOTE — ED Notes (Signed)
Tried to call and give report. Nurse said to call back in 15 minutes

## 2020-08-10 NOTE — ED Notes (Signed)
Pt refusing sling.

## 2020-08-10 NOTE — Consult Note (Signed)
Orthopedics consulted when patient at [facility].  Formal consult to follow today.     Humerus fracture will require surgery at some point but no OR availability today.    Tibia/Fibula may end up requiring surgery for mobilization purposes but non-displaced.

## 2020-08-10 NOTE — Progress Notes (Signed)
Due to operating room availability the patient is tentatively posted on Friday at 1230.  We will plan for reverse total shoulder arthroplasty at that point.

## 2020-08-10 NOTE — ED Notes (Addendum)
Pt refusing CT scans at this time. Pain meds offered. Pt states "I am hurting and tired of getting scans. There is nothing wrong with my stomach or knee." Rancour, MD made aware.

## 2020-08-10 NOTE — ED Notes (Signed)
Dr. Bobbye Morton updated regarding CT scan results. Pending admission.

## 2020-08-10 NOTE — ED Notes (Signed)
Pt alert and oriented  Tolerating po flds.  Good bilateral radial and pedal pulses

## 2020-08-10 NOTE — Progress Notes (Signed)
PT Cancellation Note  Patient Details Name: [name] MRN: [id number] DOB: [birth year]   Cancelled Treatment:    Reason Eval/Treat Not Completed: Medical issues which prohibited therapy Pt in ED and awaiting formal ortho recommendations. Will follow up as pt appropriate and as schedule allows.   Lou Miner, DPT  Acute Rehabilitation Services  Pager: [phone] Office: [phone]    Rudean Hitt 08/10/2020, 9:11 AM

## 2020-08-11 LAB — CBC
HCT: 35.9 % — ABNORMAL LOW (ref 36.0–46.0)
Hemoglobin: 11.3 g/dL — ABNORMAL LOW (ref 12.0–15.0)
MCH: 30.5 pg (ref 26.0–34.0)
MCHC: 31.5 g/dL (ref 30.0–36.0)
MCV: 97 fL (ref 80.0–100.0)
Platelets: 222 10*3/uL (ref 150–400)
RBC: 3.7 MIL/uL — ABNORMAL LOW (ref 3.87–5.11)
RDW: 13.2 % (ref 11.5–15.5)
WBC: 11.4 10*3/uL — ABNORMAL HIGH (ref 4.0–10.5)
nRBC: 0 % (ref 0.0–0.2)

## 2020-08-11 LAB — BASIC METABOLIC PANEL
Anion gap: 9 (ref 5–15)
BUN: 18 mg/dL (ref 8–23)
CO2: 27 mmol/L (ref 22–32)
Calcium: 8.3 mg/dL — ABNORMAL LOW (ref 8.9–10.3)
Chloride: 96 mmol/L — ABNORMAL LOW (ref 98–111)
Creatinine, Ser: 1.2 mg/dL — ABNORMAL HIGH (ref 0.44–1.00)
GFR, Estimated: 49 mL/min — ABNORMAL LOW (ref >60)
Glucose, Bld: 139 mg/dL — ABNORMAL HIGH (ref 70–99)
Potassium: 3.9 mmol/L (ref 3.5–5.1)
Sodium: 132 mmol/L — ABNORMAL LOW (ref 135–145)

## 2020-08-11 LAB — HIV ANTIBODY (ROUTINE TESTING W REFLEX): HIV Screen 4th Generation wRfx: NONREACTIVE

## 2020-08-11 MED ORDER — ACETAMINOPHEN 10 MG/ML IV SOLN
1000.0000 mg | Freq: Four times a day (QID) | INTRAVENOUS | Status: AC
  Administered 2020-08-11 – 2020-08-12 (×3): 1000 mg via INTRAVENOUS
  Filled 2020-08-11 (×3): qty 100

## 2020-08-11 MED ORDER — PANTOPRAZOLE SODIUM 40 MG PO TBEC
40.0000 mg | DELAYED_RELEASE_TABLET | Freq: Every day | ORAL | Status: DC
  Administered 2020-08-11 – 2020-08-21 (×11): 40 mg via ORAL
  Filled 2020-08-11 (×11): qty 1

## 2020-08-11 MED ORDER — METHOCARBAMOL 1000 MG/10ML IJ SOLN
500.0000 mg | Freq: Three times a day (TID) | INTRAVENOUS | Status: DC
  Administered 2020-08-11 – 2020-08-12 (×2): 500 mg via INTRAVENOUS
  Filled 2020-08-11: qty 5

## 2020-08-11 MED ORDER — PAROXETINE HCL 20 MG PO TABS
20.0000 mg | ORAL_TABLET | Freq: Every day | ORAL | Status: DC
  Administered 2020-08-11 – 2020-08-18 (×8): 20 mg via ORAL
  Filled 2020-08-11 (×9): qty 1

## 2020-08-11 MED ORDER — TERIPARATIDE 600 MCG/2.4ML ~~LOC~~ SOPN
20.0000 ug | PEN_INJECTOR | Freq: Every day | SUBCUTANEOUS | Status: DC
  Administered 2020-08-11 – 2020-08-19 (×6): 20 ug via SUBCUTANEOUS
  Filled 2020-08-11 (×5): qty 620

## 2020-08-11 MED ORDER — METHOCARBAMOL 1000 MG/10ML IJ SOLN
500.0000 mg | Freq: Four times a day (QID) | INTRAVENOUS | Status: DC | PRN
  Filled 2020-08-11 (×2): qty 5

## 2020-08-11 MED ORDER — EVOLOCUMAB 140 MG/ML ~~LOC~~ SOAJ: 140.0000 mg | SUBCUTANEOUS | Status: DC

## 2020-08-11 MED ORDER — METHOCARBAMOL 1000 MG/10ML IJ SOLN: 500.0000 mg | Freq: Three times a day (TID) | INTRAVENOUS | Status: DC

## 2020-08-11 MED ORDER — VITAMIN D 25 MCG (1000 UNIT) PO TABS
1000.0000 [IU] | ORAL_TABLET | Freq: Every day | ORAL | Status: DC
  Administered 2020-08-11 – 2020-08-21 (×11): 1000 [IU] via ORAL
  Filled 2020-08-11 (×11): qty 1

## 2020-08-11 MED ORDER — ONDANSETRON HCL 4 MG/2ML IJ SOLN
4.0000 mg | Freq: Once | INTRAMUSCULAR | Status: AC
  Administered 2020-08-11: 4 mg via INTRAVENOUS
  Filled 2020-08-11: qty 2

## 2020-08-11 MED ORDER — LEVOTHYROXINE SODIUM 100 MCG PO TABS
100.0000 ug | ORAL_TABLET | Freq: Every day | ORAL | Status: DC
  Administered 2020-08-11 – 2020-08-21 (×11): 100 ug via ORAL
  Filled 2020-08-11 (×11): qty 1

## 2020-08-11 MED ORDER — PREDNISONE 5 MG PO TABS
5.0000 mg | ORAL_TABLET | Freq: Every day | ORAL | Status: DC
  Administered 2020-08-11 – 2020-08-21 (×11): 5 mg via ORAL
  Filled 2020-08-11 (×11): qty 1

## 2020-08-11 MED ORDER — ENOXAPARIN SODIUM 30 MG/0.3ML IJ SOSY
30.0000 mg | PREFILLED_SYRINGE | Freq: Two times a day (BID) | INTRAMUSCULAR | Status: DC
  Administered 2020-08-11 (×2): 30 mg via SUBCUTANEOUS
  Filled 2020-08-11 (×2): qty 0.3

## 2020-08-11 MED ORDER — VITAMIN D (ERGOCALCIFEROL) 1.25 MG (50000 UNIT) PO CAPS
50000.0000 [IU] | ORAL_CAPSULE | ORAL | Status: DC
  Administered 2020-08-15 – 2020-08-22 (×3): 50000 [IU] via ORAL
  Filled 2020-08-11 (×2): qty 1

## 2020-08-11 NOTE — Progress Notes (Signed)
Orthopedic Tech Progress Note Patient Details:  [name] [birth year] [id number]  Ortho Devices Type of Ortho Device: Arm sling, Knee Immobilizer Ortho Device/Splint Interventions: Ordered      Linus Salmons Breydon Senters 08/11/2020, 12:06 PM

## 2020-08-11 NOTE — Progress Notes (Signed)
Pt typically gets VERY sick (n/v) after anesthesia. She is asking for a nausea patch prior to surgery tomorrow to help with this.

## 2020-08-11 NOTE — Progress Notes (Signed)
PT Cancellation Note  Patient Details Name: [name] MRN: [id number] DOB: 07-30-1950   Cancelled Treatment:    Reason Eval/Treat Not Completed: Pain limiting ability to participate; attempted again to see pt her family is in the hallway and states she is refusing mobility due to just finally getting comfortable.  Will attempt another day.   Reginia Naas 08/11/2020, 3:21 PM Magda Kiel, PT Acute Rehabilitation Services Pager:[phone] Office:[phone] 08/11/2020

## 2020-08-11 NOTE — Progress Notes (Addendum)
Occupational Therapy Cancellation Patient Details Name: SHEWANDA SHARPE MRN: [id number] DOB: 07/28/1950 Today's Date: 08/11/2020   OT Cancellation Note  Patient Details Name: AYLINN RYDBERG MRN: [id number] DOB: 1950-03-14   Cancelled Treatment:    Reason Eval/Treat Not Completed: Patient not medically ready;Pain limiting ability to participate. PA in room on OT entry, requesting for therapies to be held this morning due to pt pain and difficult night. Additionally pt refusing to get out of bed at this time due to excruciating pain. OT will follow up as time allows.   Update: 2:40 - Pt continued to refuse therapy due to pain levels. OT will follow up after surgery tomorrow for Evaluation.   Marieanne Marxen H., OTR/L Acute Rehabilitation  Axle Parfait Elane Yolanda Bonine 08/11/2020, 8:30 AM

## 2020-08-11 NOTE — Plan of Care (Signed)

## 2020-08-11 NOTE — Progress Notes (Signed)
Progress Note     Subjective: Patient sleeping this AM but awakens easily. Reports she had severe pain overnight and vomited secondary to this. Resting comfortably now. Denies abdominal pain. Denies SOB.   Objective: Vital signs in last 24 hours: Temp:  [97.8 F (36.6 C)-99.6 F (37.6 C)] 98.3 F (36.8 C) (06/30 0521) Pulse Rate:  [70-93] 91 (06/30 0521) Resp:  [10-22] 17 (06/30 0220) BP: (103-129)/(59-78) 121/70 (06/30 0521) SpO2:  [88 %-98 %] 97 % (06/30 0521) Last BM Date: 08/09/20  Intake/Output from previous day: 06/29 0701 - 06/30 0700 In: 610 [P.O.:60; I.V.:550] Out: -  Intake/Output this shift: No intake/output data recorded.  PE: General: pleasant, WD, overweight female who is laying in bed in NAD HEENT: ecchymosis to nose and bl orbits Heart: regular, rate, and rhythm. Radial pulses 2+ BL, pedal pulses 2+ BL Lungs: CTAB, no wheezes, rhonchi, or rales noted.  Respiratory effort nonlabored Abd: soft, NT, ND, +BS, no masses, hernias, or organomegaly MS: RUE mildly edematous with ecchymosis, R hand NVI; RLE in KI, R foot NVI    Lab Results:  Recent Labs    08/09/20 2140 08/11/20 0217  WBC 14.8* 11.4*  HGB 13.1 11.3*  HCT 39.9 35.9*  PLT 241 222   BMET Recent Labs    08/09/20 2140 08/11/20 0217  NA 136 132*  K 3.6 3.9  CL 104 96*  CO2 25 27  GLUCOSE 130* 139*  BUN 17 18  CREATININE 1.03* 1.20*  CALCIUM 8.8* 8.3*   PT/INR No results for input(s): LABPROT, INR in the last 72 hours. CMP     Component Value Date/Time   NA 132 (L) 08/11/2020 0217   K 3.9 08/11/2020 0217   CL 96 (L) 08/11/2020 0217   CO2 27 08/11/2020 0217   GLUCOSE 139 (H) 08/11/2020 0217   BUN 18 08/11/2020 0217   CREATININE 1.20 (H) 08/11/2020 0217   CREATININE 0.90 04/22/2020 1347   CALCIUM 8.3 (L) 08/11/2020 0217   PROT 6.5 08/09/2020 2140   ALBUMIN 3.6 08/09/2020 2140   AST 27 08/09/2020 2140   ALT 20 08/09/2020 2140   ALKPHOS 85 08/09/2020 2140   BILITOT 0.9  08/09/2020 2140   GFRNONAA 49 (L) 08/11/2020 0217   GFRNONAA 65 04/22/2020 1347   GFRAA 75 04/22/2020 1347   Lipase     Component Value Date/Time   LIPASE 37 01/06/2016 1441       Studies/Results: CT ABDOMEN PELVIS WO CONTRAST  Result Date: 08/10/2020 CLINICAL DATA:  Abdominal trauma, fall EXAM: CT ABDOMEN AND PELVIS WITHOUT CONTRAST TECHNIQUE: Multidetector CT imaging of the abdomen and pelvis was performed following the standard protocol without IV contrast. COMPARISON:  05/16/2018 FINDINGS: Lower chest: Bibasilar atelectasis.  No effusions. Hepatobiliary: No focal hepatic abnormality. Gallbladder unremarkable. No hepatic injury or perihepatic hematoma. Pancreas: No focal abnormality or ductal dilatation. Spleen: No splenic injury or perisplenic hematoma. Adrenals/Urinary Tract: No adrenal hemorrhage or renal injury identified. Bladder is unremarkable. Stomach/Bowel: Stomach, large and small bowel grossly unremarkable. Vascular/Lymphatic: No evidence of aneurysm or adenopathy. Aortic atherosclerosis. Reproductive: Uterus and adnexa unremarkable.  No mass. Other: No free fluid or free air. Musculoskeletal: Moderate compression fracture at L1, stable since prior study. No acute bony abnormality. IMPRESSION: No evidence of significant traumatic injury in the abdomen or pelvis. No acute findings. Electronically Signed   By: Rolm Baptise M.D.   On: 08/10/2020 03:16   CT Head Wo Contrast  Result Date: 08/09/2020 CLINICAL DATA:  Head trauma EXAM:  CT HEAD WITHOUT CONTRAST CT MAXILLOFACIAL WITHOUT CONTRAST CT CERVICAL SPINE WITHOUT CONTRAST TECHNIQUE: Multidetector CT imaging of the head, cervical spine, and maxillofacial structures were performed using the standard protocol without intravenous contrast. Multiplanar CT image reconstructions of the cervical spine and maxillofacial structures were also generated. COMPARISON:  None. FINDINGS: CT HEAD FINDINGS Brain: No acute territorial infarction,  hemorrhage, or intracranial mass. Encephalomalacia within the inferior left cerebellar hemisphere consistent with chronic infarct. Nonenlarged ventricles. Mild atrophy Vascular: No hyperdense vessels. Vertebral and carotid vascular calcification. Skull: No depressed skull fracture Other: None CT MAXILLOFACIAL FINDINGS Osseous: Mandibular heads are normally position. No mandibular fracture. Mastoid air cells are clear. Pterygoid plates and zygomatic arches are intact. Acute bilateral mildly depressed nasal bone fractures. Orbits: Negative. No traumatic or inflammatory finding. Sinuses: Fluid level left maxillary sinus without definitive sinus wall fracture. Small fluid levels in the posterior ethmoid sinuses. Soft tissues: Swelling over the nasal region and left forehead. CT CERVICAL SPINE FINDINGS Alignment: Within normal limits.  Facet alignment is normal Skull base and vertebrae: No acute fracture. No primary bone lesion or focal pathologic process. Soft tissues and spinal canal: No prevertebral fluid or swelling. No visible canal hematoma. Disc levels:  Disc spaces are patent. Upper chest: Negative. Other: None IMPRESSION: 1. No CT evidence for acute intracranial abnormality. Chronic left cerebellar infarct and mild atrophy 2. No CT evidence for acute osseous abnormality of the cervical spine 3. Acute bilateral mildly displaced nasal bone fracture. Fluid levels in left maxillary and posterior ethmoid sinuses but without definitive sinus fracture identified. Electronically Signed   By: Donavan Foil M.D.   On: 08/09/2020 20:11   CT Chest W Contrast  Result Date: 08/09/2020 CLINICAL DATA:  Chest trauma, hypoxia EXAM: CT CHEST WITH CONTRAST TECHNIQUE: Multidetector CT imaging of the chest was performed during intravenous contrast administration. CONTRAST:  39mL OMNIPAQUE IOHEXOL 300 MG/ML  SOLN COMPARISON:  None. FINDINGS: Cardiovascular: Heart is normal size. Aorta normal caliber. Scattered aortic  calcifications. Mediastinum/Nodes: No mediastinal, hilar, or axillary adenopathy. Trachea and esophagus are unremarkable. Thyroid unremarkable. Lungs/Pleura: Lungs are clear. No focal airspace opacities or suspicious nodules. No effusions. No pneumothorax Upper Abdomen: Imaging into the upper abdomen demonstrates no acute findings. Musculoskeletal: Chest wall soft tissues are unremarkable. Comminuted proximal right humeral fracture noted. IMPRESSION: Comminuted proximal right humeral fracture. No acute cardiopulmonary disease. Aortic Atherosclerosis (ICD10-I70.0). Electronically Signed   By: Rolm Baptise M.D.   On: 08/09/2020 23:09   CT Cervical Spine Wo Contrast  Result Date: 08/09/2020 CLINICAL DATA:  Head trauma EXAM: CT HEAD WITHOUT CONTRAST CT MAXILLOFACIAL WITHOUT CONTRAST CT CERVICAL SPINE WITHOUT CONTRAST TECHNIQUE: Multidetector CT imaging of the head, cervical spine, and maxillofacial structures were performed using the standard protocol without intravenous contrast. Multiplanar CT image reconstructions of the cervical spine and maxillofacial structures were also generated. COMPARISON:  None. FINDINGS: CT HEAD FINDINGS Brain: No acute territorial infarction, hemorrhage, or intracranial mass. Encephalomalacia within the inferior left cerebellar hemisphere consistent with chronic infarct. Nonenlarged ventricles. Mild atrophy Vascular: No hyperdense vessels. Vertebral and carotid vascular calcification. Skull: No depressed skull fracture Other: None CT MAXILLOFACIAL FINDINGS Osseous: Mandibular heads are normally position. No mandibular fracture. Mastoid air cells are clear. Pterygoid plates and zygomatic arches are intact. Acute bilateral mildly depressed nasal bone fractures. Orbits: Negative. No traumatic or inflammatory finding. Sinuses: Fluid level left maxillary sinus without definitive sinus wall fracture. Small fluid levels in the posterior ethmoid sinuses. Soft tissues: Swelling over the nasal  region and left  forehead. CT CERVICAL SPINE FINDINGS Alignment: Within normal limits.  Facet alignment is normal Skull base and vertebrae: No acute fracture. No primary bone lesion or focal pathologic process. Soft tissues and spinal canal: No prevertebral fluid or swelling. No visible canal hematoma. Disc levels:  Disc spaces are patent. Upper chest: Negative. Other: None IMPRESSION: 1. No CT evidence for acute intracranial abnormality. Chronic left cerebellar infarct and mild atrophy 2. No CT evidence for acute osseous abnormality of the cervical spine 3. Acute bilateral mildly displaced nasal bone fracture. Fluid levels in left maxillary and posterior ethmoid sinuses but without definitive sinus fracture identified. Electronically Signed   By: Donavan Foil M.D.   On: 08/09/2020 20:11   CT Knee Right Wo Contrast  Result Date: 08/10/2020 CLINICAL DATA:  Fall, right knee pain EXAM: CT OF THE RIGHT KNEE WITHOUT CONTRAST TECHNIQUE: Multidetector CT imaging of the RIGHT knee was performed according to the standard protocol. Multiplanar CT image reconstructions were also generated. COMPARISON:  None. FINDINGS: Bones/Joint/Cartilage There is a minimally displaced lateral tibial plateau fracture with fracture fragments in anatomic alignment and no associated articular incongruity. This is best appreciated on coronal image # [id number] 5/6. A minimally displaced anatomic aligned intra-articular fracture of the proximal fibula is also identified, best appreciated on axial image # 80/3 and coronal image # 43/6. The patella and visualized distal femur are intact. There is moderate tricompartmental degenerative arthritis of the right knee with asymmetric joint space narrowing. Ligaments Suboptimally assessed by CT. Muscles and Tendons Normal muscle bulk.  Quadriceps and patellar tendons are intact. Soft tissues Small lipohemarthrosis present. IMPRESSION: Minimally displaced, anatomically aligned lateral tibial plateau fracture  and intra-articular fracture of the proximal fibula. Small associated lipohemarthrosis. No articular incongruity. Moderate tricompartmental degenerative arthritis. Electronically Signed   By: Fidela Salisbury MD   On: 08/10/2020 03:24   CT Shoulder Right Wo Contrast  Result Date: 08/09/2020 CLINICAL DATA:  Shoulder trauma EXAM: CT OF THE UPPER RIGHT EXTREMITY WITHOUT CONTRAST TECHNIQUE: Multidetector CT imaging of the upper right extremity was performed according to the standard protocol. COMPARISON:  Plain films earlier today FINDINGS: Bones/Joint/Cartilage Highly comminuted fracture noted through the proximal right humerus involving the humeral neck and proximal shaft and the greater tuberosity region. Significant displacement of multiple fracture fragments. No subluxation or dislocation. Ligaments Suboptimally assessed by CT. Muscles and Tendons Probable hematoma within the right deltoid muscle and pectoralis muscle. Soft tissues Intact IMPRESSION: Highly comminuted displaced proximal right humeral fracture involving the greater tuberosity, humeral neck and proximal shaft. Probable surrounding intramuscular hematoma. Electronically Signed   By: Rolm Baptise M.D.   On: 08/09/2020 23:12   DG Shoulder Right Portable  Result Date: 08/09/2020 CLINICAL DATA:  Fall, shoulder pain EXAM: PORTABLE RIGHT SHOULDER COMPARISON:  None. FINDINGS: Comminuted proximal right humeral fracture involving the humeral neck and greater tuberosity region of the humeral head. No subluxation or dislocation. Significantly displaced fracture fragments. IMPRESSION: Displaced, comminuted proximal right humeral fracture. Electronically Signed   By: Rolm Baptise M.D.   On: 08/09/2020 19:45   DG Knee Complete 4 Views Right  Result Date: 08/09/2020 CLINICAL DATA:  Fall, knee pain EXAM: RIGHT KNEE - COMPLETE 4+ VIEW COMPARISON:  None. FINDINGS: No acute bony abnormality. Specifically, no fracture, subluxation, or dislocation. Joint spaces  maintained. Small joint effusion. IMPRESSION: No acute bony abnormality.  Small joint effusion. Electronically Signed   By: Rolm Baptise M.D.   On: 08/09/2020 19:46   CT Maxillofacial Wo Contrast  Result Date:  08/09/2020 CLINICAL DATA:  Head trauma EXAM: CT HEAD WITHOUT CONTRAST CT MAXILLOFACIAL WITHOUT CONTRAST CT CERVICAL SPINE WITHOUT CONTRAST TECHNIQUE: Multidetector CT imaging of the head, cervical spine, and maxillofacial structures were performed using the standard protocol without intravenous contrast. Multiplanar CT image reconstructions of the cervical spine and maxillofacial structures were also generated. COMPARISON:  None. FINDINGS: CT HEAD FINDINGS Brain: No acute territorial infarction, hemorrhage, or intracranial mass. Encephalomalacia within the inferior left cerebellar hemisphere consistent with chronic infarct. Nonenlarged ventricles. Mild atrophy Vascular: No hyperdense vessels. Vertebral and carotid vascular calcification. Skull: No depressed skull fracture Other: None CT MAXILLOFACIAL FINDINGS Osseous: Mandibular heads are normally position. No mandibular fracture. Mastoid air cells are clear. Pterygoid plates and zygomatic arches are intact. Acute bilateral mildly depressed nasal bone fractures. Orbits: Negative. No traumatic or inflammatory finding. Sinuses: Fluid level left maxillary sinus without definitive sinus wall fracture. Small fluid levels in the posterior ethmoid sinuses. Soft tissues: Swelling over the nasal region and left forehead. CT CERVICAL SPINE FINDINGS Alignment: Within normal limits.  Facet alignment is normal Skull base and vertebrae: No acute fracture. No primary bone lesion or focal pathologic process. Soft tissues and spinal canal: No prevertebral fluid or swelling. No visible canal hematoma. Disc levels:  Disc spaces are patent. Upper chest: Negative. Other: None IMPRESSION: 1. No CT evidence for acute intracranial abnormality. Chronic left cerebellar infarct and  mild atrophy 2. No CT evidence for acute osseous abnormality of the cervical spine 3. Acute bilateral mildly displaced nasal bone fracture. Fluid levels in left maxillary and posterior ethmoid sinuses but without definitive sinus fracture identified. Electronically Signed   By: Donavan Foil M.D.   On: 08/09/2020 20:11    Anti-infectives: Anti-infectives (From admission, onward)    None        Assessment/Plan GLF Comminuted R humerus fx - likely OR tomorrow with Dr. Griffin Basil  R tibial plateau fx and fibula fx - WBAT in [place] per ortho, may need OR pending mobility  B/l nasal bone frx - f/u as outpatient with ENT  FEN - soft diet, LR @50  DVT - lovenox ID - no current abx indicated  Dispo - pain control, PT/OT. OR tomorrow   LOS: 1 day    Norm Parcel, [facility] 08/11/2020, 8:42 AM Please see Amion for pager number during day hours 7:00am-4:30pm

## 2020-08-11 NOTE — Progress Notes (Signed)
Due to surgical availability Dr. Victorino December will perform the shoulder replacement surgery tomorrow at 12:30.  NPO at midnight.

## 2020-08-11 NOTE — Progress Notes (Signed)
PT Cancellation Note  Patient Details Name: YOLANDO GILLUM MRN: [id number] DOB: 04-04-1950   Cancelled Treatment:    Reason Eval/Treat Not Completed: Pain limiting ability to participate; appreciated note per OT regarding pain and difficult night.  Will attempt later as time allows.   Reginia Naas 08/11/2020, 9:07 AM Magda Kiel, PT Acute Rehabilitation Services PYPPJ:[phone] Office:[phone] 08/11/2020

## 2020-08-12 ENCOUNTER — Encounter (HOSPITAL_COMMUNITY): Admission: EM | Disposition: A | Discharge status: Home / Self Care | Payer: Self-pay | Source: Home / Self Care

## 2020-08-12 ENCOUNTER — Inpatient Hospital Stay (HOSPITAL_COMMUNITY): Payer: PPO | Admitting: Registered Nurse

## 2020-08-12 ENCOUNTER — Inpatient Hospital Stay (HOSPITAL_COMMUNITY): Payer: PPO

## 2020-08-12 ENCOUNTER — Encounter (HOSPITAL_COMMUNITY): Payer: Self-pay

## 2020-08-12 HISTORY — PX: REVERSE SHOULDER ARTHROPLASTY: SHX5054

## 2020-08-12 LAB — BASIC METABOLIC PANEL
Anion gap: 7 (ref 5–15)
BUN: 14 mg/dL (ref 8–23)
CO2: 26 mmol/L (ref 22–32)
Calcium: 8 mg/dL — ABNORMAL LOW (ref 8.9–10.3)
Chloride: 99 mmol/L (ref 98–111)
Creatinine, Ser: 0.93 mg/dL (ref 0.44–1.00)
GFR, Estimated: >60 mL/min (ref >60)
Glucose, Bld: 108 mg/dL — ABNORMAL HIGH (ref 70–99)
Potassium: 3.7 mmol/L (ref 3.5–5.1)
Sodium: 132 mmol/L — ABNORMAL LOW (ref 135–145)

## 2020-08-12 LAB — SURGICAL PCR SCREEN
MRSA, PCR: NEGATIVE
Staphylococcus aureus: POSITIVE — AB

## 2020-08-12 LAB — CBC
HCT: 29.2 % — ABNORMAL LOW (ref 36.0–46.0)
Hemoglobin: 9.1 g/dL — ABNORMAL LOW (ref 12.0–15.0)
MCH: 30.2 pg (ref 26.0–34.0)
MCHC: 31.2 g/dL (ref 30.0–36.0)
MCV: 97 fL (ref 80.0–100.0)
Platelets: 166 10*3/uL (ref 150–400)
RBC: 3.01 MIL/uL — ABNORMAL LOW (ref 3.87–5.11)
RDW: 13.1 % (ref 11.5–15.5)
WBC: 8.6 10*3/uL (ref 4.0–10.5)
nRBC: 0 % (ref 0.0–0.2)

## 2020-08-12 SURGERY — ARTHROPLASTY, SHOULDER, TOTAL, REVERSE
Anesthesia: Regional | Site: Shoulder | Laterality: Right

## 2020-08-12 MED ORDER — METOCLOPRAMIDE HCL 5 MG PO TABS
5.0000 mg | ORAL_TABLET | Freq: Three times a day (TID) | ORAL | Status: DC | PRN
Start: 2020-08-12 — End: 2020-08-19

## 2020-08-12 MED ORDER — SUGAMMADEX SODIUM 200 MG/2ML IV SOLN
INTRAVENOUS | Status: DC | PRN
  Administered 2020-08-12: 200 mg via INTRAVENOUS

## 2020-08-12 MED ORDER — ENOXAPARIN SODIUM 30 MG/0.3ML IJ SOSY
30.0000 mg | PREFILLED_SYRINGE | Freq: Two times a day (BID) | INTRAMUSCULAR | Status: DC
  Administered 2020-08-13 – 2020-08-22 (×19): 30 mg via SUBCUTANEOUS
  Filled 2020-08-12 (×19): qty 0.3

## 2020-08-12 MED ORDER — ORAL CARE MOUTH RINSE: 15.0000 mL | Freq: Once | OROMUCOSAL | Status: AC

## 2020-08-12 MED ORDER — TRANEXAMIC ACID-NACL 1000-0.7 MG/100ML-% IV SOLN
INTRAVENOUS | Status: AC
  Filled 2020-08-12: qty 100

## 2020-08-12 MED ORDER — FENTANYL CITRATE (PF) 100 MCG/2ML IJ SOLN
25.0000 ug | INTRAMUSCULAR | Status: DC | PRN
  Administered 2020-08-12: 25 ug via INTRAVENOUS

## 2020-08-12 MED ORDER — BUPIVACAINE-EPINEPHRINE (PF) 0.25% -1:200000 IJ SOLN: INTRAMUSCULAR | Status: DC | PRN

## 2020-08-12 MED ORDER — ROCURONIUM BROMIDE 10 MG/ML (PF) SYRINGE
PREFILLED_SYRINGE | INTRAVENOUS | Status: DC | PRN
  Administered 2020-08-12: 100 mg via INTRAVENOUS

## 2020-08-12 MED ORDER — CHLORHEXIDINE GLUCONATE 0.12 % MT SOLN
OROMUCOSAL | Status: AC
  Administered 2020-08-12: 15 mL via OROMUCOSAL
  Filled 2020-08-12: qty 15

## 2020-08-12 MED ORDER — LACTATED RINGERS IV SOLN: INTRAVENOUS | Status: DC | PRN

## 2020-08-12 MED ORDER — CHLORHEXIDINE GLUCONATE CLOTH 2 % EX PADS
6.0000 | MEDICATED_PAD | Freq: Every day | CUTANEOUS | Status: AC
  Administered 2020-08-13 – 2020-08-17 (×4): 6 via TOPICAL

## 2020-08-12 MED ORDER — FENTANYL CITRATE (PF) 100 MCG/2ML IJ SOLN
INTRAMUSCULAR | Status: AC
  Administered 2020-08-12: 100 ug via INTRAVENOUS
  Filled 2020-08-12: qty 2

## 2020-08-12 MED ORDER — SCOPOLAMINE 1 MG/3DAYS TD PT72
1.0000 | MEDICATED_PATCH | TRANSDERMAL | Status: DC
  Administered 2020-08-12 – 2020-08-21 (×4): 1.5 mg via TRANSDERMAL
  Filled 2020-08-12 (×5): qty 1

## 2020-08-12 MED ORDER — LIDOCAINE 2% (20 MG/ML) 5 ML SYRINGE
INTRAMUSCULAR | Status: DC | PRN
  Administered 2020-08-12: 60 mg via INTRAVENOUS

## 2020-08-12 MED ORDER — MENTHOL 3 MG MT LOZG: 1.0000 | LOZENGE | OROMUCOSAL | Status: DC | PRN

## 2020-08-12 MED ORDER — BUPIVACAINE HCL (PF) 0.5 % IJ SOLN
INTRAMUSCULAR | Status: DC | PRN
  Administered 2020-08-12: 15 mL via PERINEURAL

## 2020-08-12 MED ORDER — HYDROMORPHONE HCL 1 MG/ML IJ SOLN
1.0000 mg | Freq: Once | INTRAMUSCULAR | Status: AC
  Administered 2020-08-12: 1 mg via INTRAVENOUS
  Filled 2020-08-12: qty 1

## 2020-08-12 MED ORDER — METOCLOPRAMIDE HCL 5 MG/ML IJ SOLN
5.0000 mg | Freq: Three times a day (TID) | INTRAMUSCULAR | Status: DC | PRN
Start: 2020-08-12 — End: 2020-08-19
  Administered 2020-08-19: 10 mg via INTRAVENOUS
  Filled 2020-08-12 (×2): qty 2

## 2020-08-12 MED ORDER — DOCUSATE SODIUM 100 MG PO CAPS: 100.0000 mg | ORAL_CAPSULE | Freq: Two times a day (BID) | ORAL | Status: DC

## 2020-08-12 MED ORDER — PROMETHAZINE HCL 25 MG PO TABS: 25.0000 mg | ORAL_TABLET | Freq: Four times a day (QID) | ORAL | Status: DC | PRN

## 2020-08-12 MED ORDER — CEFAZOLIN SODIUM-DEXTROSE 2-4 GM/100ML-% IV SOLN
2.0000 g | INTRAVENOUS | Status: AC
  Administered 2020-08-12: 2 g via INTRAVENOUS
  Filled 2020-08-12: qty 100

## 2020-08-12 MED ORDER — FENTANYL CITRATE (PF) 250 MCG/5ML IJ SOLN
INTRAMUSCULAR | Status: DC | PRN
  Administered 2020-08-12: 100 ug via INTRAVENOUS

## 2020-08-12 MED ORDER — PHENYLEPHRINE HCL-NACL 10-0.9 MG/250ML-% IV SOLN
INTRAVENOUS | Status: DC | PRN
  Administered 2020-08-12: 45 ug/min via INTRAVENOUS

## 2020-08-12 MED ORDER — MIDAZOLAM HCL 2 MG/2ML IJ SOLN
INTRAMUSCULAR | Status: AC
  Administered 2020-08-12: 2 mg via INTRAVENOUS
  Filled 2020-08-12: qty 2

## 2020-08-12 MED ORDER — VANCOMYCIN HCL 1 G IV SOLR
INTRAVENOUS | Status: DC | PRN
  Administered 2020-08-12: 1000 mg

## 2020-08-12 MED ORDER — PROPOFOL 500 MG/50ML IV EMUL
INTRAVENOUS | Status: DC | PRN
  Administered 2020-08-12: 25 ug/kg/min via INTRAVENOUS

## 2020-08-12 MED ORDER — PROMETHAZINE HCL 25 MG RE SUPP
25.0000 mg | Freq: Four times a day (QID) | RECTAL | Status: DC | PRN
  Administered 2020-08-14 – 2020-08-18 (×2): 25 mg via RECTAL
  Filled 2020-08-12 (×5): qty 1

## 2020-08-12 MED ORDER — ONDANSETRON HCL 4 MG/2ML IJ SOLN: 4.0000 mg | Freq: Four times a day (QID) | INTRAMUSCULAR | Status: DC | PRN

## 2020-08-12 MED ORDER — TRANEXAMIC ACID-NACL 1000-0.7 MG/100ML-% IV SOLN
1000.0000 mg | INTRAVENOUS | Status: AC
  Administered 2020-08-12: 1000 mg via INTRAVENOUS
  Filled 2020-08-12: qty 100

## 2020-08-12 MED ORDER — 0.9 % SODIUM CHLORIDE (POUR BTL) OPTIME
TOPICAL | Status: DC | PRN
  Administered 2020-08-12: 1000 mL

## 2020-08-12 MED ORDER — PHENOL 1.4 % MT LIQD: 1.0000 | OROMUCOSAL | Status: DC | PRN

## 2020-08-12 MED ORDER — ENOXAPARIN SODIUM 40 MG/0.4ML IJ SOSY: 40.0000 mg | PREFILLED_SYRINGE | INTRAMUSCULAR | Status: DC

## 2020-08-12 MED ORDER — WHITE PETROLATUM EX OINT
TOPICAL_OINTMENT | CUTANEOUS | Status: AC
  Filled 2020-08-12: qty 28.35

## 2020-08-12 MED ORDER — CEFAZOLIN SODIUM-DEXTROSE 1-4 GM/50ML-% IV SOLN
1.0000 g | Freq: Four times a day (QID) | INTRAVENOUS | Status: DC
Start: 2020-08-13 — End: 2020-08-12

## 2020-08-12 MED ORDER — BUPIVACAINE-EPINEPHRINE (PF) 0.25% -1:200000 IJ SOLN
INTRAMUSCULAR | Status: AC
  Filled 2020-08-12: qty 30

## 2020-08-12 MED ORDER — LACTATED RINGERS IV SOLN: INTRAVENOUS | Status: DC

## 2020-08-12 MED ORDER — DEXAMETHASONE SODIUM PHOSPHATE 10 MG/ML IJ SOLN
INTRAMUSCULAR | Status: DC | PRN
  Administered 2020-08-12: 5 mg via INTRAVENOUS

## 2020-08-12 MED ORDER — EPHEDRINE SULFATE-NACL 50-0.9 MG/10ML-% IV SOSY
PREFILLED_SYRINGE | INTRAVENOUS | Status: DC | PRN
  Administered 2020-08-12: 5 mg via INTRAVENOUS

## 2020-08-12 MED ORDER — ONDANSETRON HCL 4 MG/2ML IJ SOLN
INTRAMUSCULAR | Status: DC | PRN
  Administered 2020-08-12: 4 mg via INTRAVENOUS

## 2020-08-12 MED ORDER — MUPIROCIN 2 % EX OINT
1.0000 "application " | TOPICAL_OINTMENT | Freq: Two times a day (BID) | CUTANEOUS | Status: AC
  Administered 2020-08-12 – 2020-08-16 (×10): 1 via NASAL
  Filled 2020-08-12 (×3): qty 22

## 2020-08-12 MED ORDER — MIDAZOLAM HCL 2 MG/2ML IJ SOLN: 2.0000 mg | Freq: Once | INTRAMUSCULAR | Status: AC

## 2020-08-12 MED ORDER — FENTANYL CITRATE (PF) 100 MCG/2ML IJ SOLN
25.0000 ug | INTRAMUSCULAR | Status: DC | PRN
Start: 2020-08-12 — End: 2020-08-13
  Administered 2020-08-12 – 2020-08-13 (×5): 25 ug via INTRAVENOUS
  Filled 2020-08-12 (×5): qty 2

## 2020-08-12 MED ORDER — PROPOFOL 10 MG/ML IV BOLUS
INTRAVENOUS | Status: DC | PRN
  Administered 2020-08-12: 100 mg via INTRAVENOUS

## 2020-08-12 MED ORDER — PROPOFOL 10 MG/ML IV BOLUS
INTRAVENOUS | Status: AC
  Filled 2020-08-12: qty 20

## 2020-08-12 MED ORDER — CHLORHEXIDINE GLUCONATE 0.12 % MT SOLN
15.0000 mL | Freq: Once | OROMUCOSAL | Status: AC
  Filled 2020-08-12: qty 15

## 2020-08-12 MED ORDER — METOCLOPRAMIDE HCL 5 MG/ML IJ SOLN
10.0000 mg | Freq: Four times a day (QID) | INTRAMUSCULAR | Status: DC | PRN
  Administered 2020-08-12: 10 mg via INTRAVENOUS
  Filled 2020-08-12: qty 2

## 2020-08-12 MED ORDER — FENTANYL CITRATE (PF) 100 MCG/2ML IJ SOLN
INTRAMUSCULAR | Status: AC
  Filled 2020-08-12: qty 2

## 2020-08-12 MED ORDER — VANCOMYCIN HCL 1000 MG IV SOLR
INTRAVENOUS | Status: AC
  Filled 2020-08-12: qty 1000

## 2020-08-12 MED ORDER — BUPIVACAINE LIPOSOME 1.3 % IJ SUSP
INTRAMUSCULAR | Status: DC | PRN
  Administered 2020-08-12: 10 mL via PERINEURAL

## 2020-08-12 MED ORDER — ONDANSETRON HCL 4 MG PO TABS: 4.0000 mg | ORAL_TABLET | Freq: Four times a day (QID) | ORAL | Status: DC | PRN

## 2020-08-12 MED ORDER — METHOCARBAMOL 1000 MG/10ML IJ SOLN
1000.0000 mg | Freq: Three times a day (TID) | INTRAVENOUS | Status: DC
  Administered 2020-08-12 – 2020-08-13 (×2): 1000 mg via INTRAVENOUS
  Filled 2020-08-12 (×6): qty 10

## 2020-08-12 MED ORDER — ACETAMINOPHEN 500 MG PO TABS
1000.0000 mg | ORAL_TABLET | Freq: Once | ORAL | Status: DC
  Filled 2020-08-12: qty 2

## 2020-08-12 MED ORDER — FENTANYL CITRATE (PF) 100 MCG/2ML IJ SOLN
100.0000 ug | Freq: Once | INTRAMUSCULAR | Status: AC
Start: 2020-08-12 — End: 2020-08-12

## 2020-08-12 MED ORDER — SODIUM CHLORIDE 0.9 % IV SOLN
12.5000 mg | Freq: Four times a day (QID) | INTRAVENOUS | Status: DC | PRN
  Administered 2020-08-12 – 2020-08-17 (×6): 12.5 mg via INTRAVENOUS
  Filled 2020-08-12 (×8): qty 0.5

## 2020-08-12 MED ORDER — FENTANYL CITRATE (PF) 250 MCG/5ML IJ SOLN
INTRAMUSCULAR | Status: AC
  Filled 2020-08-12: qty 5

## 2020-08-12 MED ORDER — PHENYLEPHRINE 40 MCG/ML (10ML) SYRINGE FOR IV PUSH (FOR BLOOD PRESSURE SUPPORT)
PREFILLED_SYRINGE | INTRAVENOUS | Status: DC | PRN
  Administered 2020-08-12: 80 ug via INTRAVENOUS
  Administered 2020-08-12 (×2): 120 ug via INTRAVENOUS
  Administered 2020-08-12: 80 ug via INTRAVENOUS

## 2020-08-12 SURGICAL SUPPLY — 73 items
BAG COUNTER SPONGE SURGICOUNT (BAG) ×2 IMPLANT
BASEPLATE GLENOSPHERE 25 (Plate) ×1 IMPLANT
BEARING HUMERAL SHLDER 36M STD (Shoulder) IMPLANT
BIT DRILL 5/64X5 DISP (BIT) ×2 IMPLANT
BIT DRILL TWIST 2.7 (BIT) ×1 IMPLANT
BLADE SAG 18X100X1.27 (BLADE) ×2 IMPLANT
COVER SURGICAL LIGHT HANDLE (MISCELLANEOUS) ×2 IMPLANT
DRAPE IMP U-DRAPE 54X76 (DRAPES) ×4 IMPLANT
DRAPE INCISE IOBAN 66X45 STRL (DRAPES) ×2 IMPLANT
DRAPE ORTHO SPLIT 77X108 STRL (DRAPES) ×4
DRAPE SURG 17X23 STRL (DRAPES) ×2 IMPLANT
DRAPE SURG ORHT 6 SPLT 77X108 (DRAPES) ×2 IMPLANT
DRAPE U-SHAPE 47X51 STRL (DRAPES) ×2 IMPLANT
DRESSING AQUACEL AG SP 3.5X6 (GAUZE/BANDAGES/DRESSINGS) ×1 IMPLANT
DRSG AQUACEL AG ADV 3.5X10 (GAUZE/BANDAGES/DRESSINGS) ×2 IMPLANT
DRSG AQUACEL AG SP 3.5X6 (GAUZE/BANDAGES/DRESSINGS) ×2
DURAPREP 26ML APPLICATOR (WOUND CARE) ×2 IMPLANT
ELECT BLADE 4.0 EZ CLEAN MEGAD (MISCELLANEOUS) ×2
ELECT REM PT RETURN 9FT ADLT (ELECTROSURGICAL) ×2
ELECTRODE BLDE 4.0 EZ CLN MEGD (MISCELLANEOUS) ×1 IMPLANT
ELECTRODE REM PT RTRN 9FT ADLT (ELECTROSURGICAL) ×1 IMPLANT
FACESHIELD WRAPAROUND (MASK) ×2 IMPLANT
FACESHIELD WRAPAROUND OR TEAM (MASK) ×1 IMPLANT
GLENOID SPHERE 36MM CVD +3 (Orthopedic Implant) ×1 IMPLANT
GLOVE SRG 8 PF TXTR STRL LF DI (GLOVE) ×2 IMPLANT
GLOVE SURG ENC MOIS LTX SZ7.5 (GLOVE) ×4 IMPLANT
GLOVE SURG UNDER POLY LF SZ8 (GLOVE) ×4
GOWN STRL REUS W/ TWL LRG LVL3 (GOWN DISPOSABLE) ×1 IMPLANT
GOWN STRL REUS W/ TWL XL LVL3 (GOWN DISPOSABLE) ×2 IMPLANT
GOWN STRL REUS W/TWL LRG LVL3 (GOWN DISPOSABLE) ×2
GOWN STRL REUS W/TWL XL LVL3 (GOWN DISPOSABLE) ×4
KIT BASIN OR (CUSTOM PROCEDURE TRAY) ×2 IMPLANT
KIT TURNOVER KIT B (KITS) ×2 IMPLANT
MANIFOLD NEPTUNE II (INSTRUMENTS) ×2 IMPLANT
NDL 1/2 CIR MAYO (NEEDLE) ×1 IMPLANT
NDL HYPO 25GX1X1/2 BEV (NEEDLE) ×1 IMPLANT
NEEDLE 1/2 CIR MAYO (NEEDLE) ×2 IMPLANT
NEEDLE HYPO 25GX1X1/2 BEV (NEEDLE) ×2 IMPLANT
NS IRRIG 1000ML POUR BTL (IV SOLUTION) ×2 IMPLANT
PACK SHOULDER (CUSTOM PROCEDURE TRAY) ×2 IMPLANT
PAD ARMBOARD 7.5X6 YLW CONV (MISCELLANEOUS) ×4 IMPLANT
PIN THREADED REVERSE (PIN) ×1 IMPLANT
RESTRAINT HEAD UNIVERSAL NS (MISCELLANEOUS) ×2 IMPLANT
SCREW BONE LOCKING 4.75X30X3.5 (Screw) ×1 IMPLANT
SCREW BONE STRL 6.5MMX25MM (Screw) ×1 IMPLANT
SCREW LOCKING 4.75MMX15MM (Screw) ×2 IMPLANT
SCREW LOCKING STRL 4.75X25X3.5 (Screw) ×1 IMPLANT
SHOULDER HUMERAL BEAR 36M STD (Shoulder) ×2 IMPLANT
SLING ARM FOAM STRAP LRG (SOFTGOODS) ×1 IMPLANT
SLING ARM IMMOBILIZER LRG (SOFTGOODS) IMPLANT
SLING ARM IMMOBILIZER MED (SOFTGOODS) IMPLANT
SPONGE T-LAP 18X18 ~~LOC~~+RFID (SPONGE) IMPLANT
SPONGE T-LAP 4X18 ~~LOC~~+RFID (SPONGE) ×2 IMPLANT
STEM HUMERAL STRL 10MMX122MM (Stem) ×1 IMPLANT
STRIP CLOSURE SKIN 1/2X4 (GAUZE/BANDAGES/DRESSINGS) ×2 IMPLANT
SUCTION FRAZIER HANDLE 10FR (MISCELLANEOUS) ×2
SUCTION TUBE FRAZIER 10FR DISP (MISCELLANEOUS) ×1 IMPLANT
SUT BROADBAND TAPE 2PK 1.5 (SUTURE) ×1 IMPLANT
SUT FIBERWIRE #2 38 T-5 BLUE (SUTURE) ×2
SUT MAXBRAID #2 CVD NDL (SUTURE) ×1 IMPLANT
SUT MNCRL AB 4-0 PS2 18 (SUTURE) ×2 IMPLANT
SUT VIC AB 1 CT1 27 (SUTURE)
SUT VIC AB 1 CT1 27XBRD ANBCTR (SUTURE) IMPLANT
SUT VIC AB 2-0 CT1 27 (SUTURE) ×4
SUT VIC AB 2-0 CT1 TAPERPNT 27 (SUTURE) ×1 IMPLANT
SUTURE FIBERWR #2 38 T-5 BLUE (SUTURE) ×1 IMPLANT
SYR CONTROL 10ML LL (SYRINGE) ×2 IMPLANT
TOWEL GREEN STERILE (TOWEL DISPOSABLE) ×2 IMPLANT
TOWEL GREEN STERILE FF (TOWEL DISPOSABLE) ×2 IMPLANT
TOWER CARTRIDGE SMART MIX (DISPOSABLE) IMPLANT
TRAY HUM REV SHOULDER +3 +5 (Shoulder) ×1 IMPLANT
WATER STERILE IRR 1000ML POUR (IV SOLUTION) ×2 IMPLANT
YANKAUER SUCT BULB TIP NO VENT (SUCTIONS) ×2 IMPLANT

## 2020-08-12 NOTE — Op Note (Addendum)
Date: 08/12/2020   PRE-OPERATIVE DIAGNOSIS:  Right 3 part proximal humerus fracture   POST-OPERATIVE DIAGNOSIS:  Same   PROCEDURE:  1. REVERSE SHOULDER ARTHROPLASTY   SURGEON:  Nicholes Stairs, MD   ASSISTANT: Jonelle Sidle, PA-C   ANESTHESIA:   General with a block   ESTIMATED BLOOD LOSS: See anesthesia record   PREOPERATIVE INDICATIONS: Mrs. Wyeth is a 70 yo RHD female who sustained a right proximal humerus fracture following a fall.  Due to the comminution and displaced nature of the fracture and head splitting components we discussed operative management.  We discussed moving forward with reverse shoulder arthroplasty for his injury to allow earlier weight bearing on a walker and/or cane as needed and lower risk of AVN, non union or progression of shoulder arthritis following the fracture. Thus we elected to proceed with reverse shoulder arthroplasty for the plan.  The risks benefits and alternatives were discussed with the patient preoperatively including but not limited to the risks of infection, bleeding, nerve injury, cardiopulmonary complications, the need for revision surgery, dislocation, brachial plexus palsy, incomplete relief of pain, among others, and the patient was willing to proceed. The patient did provided informed consent.   OPERATIVE IMPLANTS: Biomet size 10  humeral fracture stem press fitted  with a 38mm standard poly +3 medial tray and + 5 mm build up standard  liner and a 36 mm +3 glenosphere with a 25 mm (mini) baseplate and 4, 4.09 locking screws and one central 6.5 mm nonlocking screw.   OPERATIVE FINDINGS: We did note all 4 rotator cuff muscles were intact.  There was extensive calcific tendinopathy of the supraspinatus.  There was intact long head biceps tendon with maceration from the intertubercular component of the fracture.  This was a three-part fracture with comminution at the calcar as well as posteriorly.  There was approximately 2 cm of metaphyseal bone  loss appreciated.   OPERATIVE PROCEDURE: The patient was brought to the operating room and placed in the supine position. General anesthesia was administered. IV antibiotics were given. Time out was performed. The upper extremity was prepped and draped in usual sterile fashion. The patient was in a beachchair position. Deltopectoral approach was carried out. After dissection through skin and subcutaneous fat, the cephalic vein was identified with the deltopectoral interval.  This was mobilized and taken medially.   The fracture was identified and working through the fracture in the biceps groove the lesser and greater tuberosities were freed up and tagged with #2 Fiber wire sutures.  The humeral head was fragmented and removed from the wound.     Long head of biceps tendon was tenotomized and released.   I then performed circumferential releases of the humerus.  I then moved to sizing the humerus.  There was moderate calcar bone loss, and comminution, and this was used to reference the height of the stem off of the residual pectoralis tendon, as was the upper border of the pectoralis major tendon.  The canal was reamed and found to fit best with a 10 mm fracture stem.  Suture cerclage was utilized to bring the posterior lateral metaphyseal fragment back to the stem.   We next turned to the glenoid.  Deep retractors were placed, and I resected the labrum as well as the residual long head of biceps, and then placed a guidepin into the center position on the glenoid, with slight inferior declination. I then reamed over the guidepin, and this created a small metaphyseal cancellus blush inferiorly,  removing just the cartilage to the subchondral bone superiorly. The base plate was selected and impacted place, and then I secured it centrally with a nonlocking screw, and I had excellent purchase both inferiorly and superiorly. I placed a short locking screws on anterior aspect,  And posterior aspect.   I then  turned my attention to the glenosphere, and impacted this into place, placing slight inferior offset.    The glenoid sphere was completely seated, and had engagement of the [facility] taper. I then turned my attention back to the humerus.    The 10 mm fracture stem was seated to the appropriate height to allow approximate 5.6 cm from the top of the Pectoralis major tendon to the top of the glenosphere.  The stem was placed in 30 degrees of retroversion.   Once the stem was impacted into place we trialed poly liners.  Using the +3 medial with +5 buildup humeral metaglen,  The shoulder had excellent motion, and was stable.  The final poly was impacted and again showed good motion and stability.  Next, I irrigated the wounds copiously. 1 gram of vancomycin powder was added to the wound.   The greater tuberosity was brought back to the humeral stem suture holes and secured with bone graft from the humeral head as augment.  The lesser tuberosity was also brought back to the anterior humeral stem, and the the deltoid was noted to have excellent tension.  The axillary nerve was palpated at the end of implanting, and found to be in continuity and not under undo tension.   I then irrigated the shoulder copiously once more, repaired the deltopectoral interval with Vicryl followed by subcutaneous monocryl and then subcuticular monocryl with Steri-Strips and sterile gauze for the skin. The patient was awakened and returned back in stable and satisfactory condition. There no complications and he tolerated the procedure well.  All counts were correct.  The patient awakened from general anesthesia with no complications and transferred to PACU in stable condition.   Postoperative Plan: KANIJAH GROSECLOSE will remain in sling unless up with therapy.  She may weight-bear as tolerated on a walker or crutch.  However, no active range of motion at the shoulder.  Passive external rotation okay to 0 degrees and forward elevation okay to  90 degrees.  She will return to the medicine service for perioperative care and pain management.  She does have a concomitant right proximal tibia injury that will be managed conservatively where she can be weightbearing as tolerated and range of motion as tolerated.  DVT prophylaxis in the hospital will be with Lovenox starting tomorrow once per day, and then transition to twice daily 81 mg aspirin for 6 weeks.

## 2020-08-12 NOTE — Transfer of Care (Signed)
Immediate Anesthesia Transfer of Care Note  Patient: [name]  Procedure(s) Performed: REVERSE SHOULDER ARTHROPLASTY (Right: Shoulder)  Patient Location: PACU  Anesthesia Type:General  Level of Consciousness: drowsy  Airway & Oxygen Therapy: Patient Spontanous Breathing and Patient connected to face mask oxygen  Post-op Assessment: Report given to RN and Post -op Vital signs reviewed and stable  Post vital signs: Reviewed and stable  Last Vitals:  Vitals Value Taken Time  BP 140/94 08/12/20 1542  Temp    Pulse 76 08/12/20 1544  Resp 21 08/12/20 1544  SpO2 96 % 08/12/20 1544  Vitals shown include unvalidated device data.  Last Pain:  Vitals:   08/12/20 1154  TempSrc: Oral  PainSc: 10-Worst pain ever      Patients Stated Pain Goal: 0 (73/[id number]/66 [id number])  Complications: No notable events documented.

## 2020-08-12 NOTE — Progress Notes (Signed)
Pt dry heaving and nauseaous. Messaged [facility] [employer], [place]. Ordering scopolamine patch and reglan

## 2020-08-12 NOTE — Progress Notes (Addendum)
Pt off unit to OR.  Pt had fenergan IVPB 12.5mg  and has a scopolamine patch behind left ear.  Pt gets VERY nauseous post anesthesia. (Let RN know that in PACU prior to pt arrival down there).

## 2020-08-12 NOTE — Anesthesia Postprocedure Evaluation (Signed)
Anesthesia Post Note  Patient: [name]  Procedure(s) Performed: REVERSE SHOULDER ARTHROPLASTY (Right: Shoulder)     Patient location during evaluation: PACU Anesthesia Type: Regional and General Level of consciousness: awake and alert, oriented and patient cooperative Pain management: pain level controlled Vital Signs Assessment: post-procedure vital signs reviewed and stable Respiratory status: spontaneous breathing, nonlabored ventilation and respiratory function stable Cardiovascular status: blood pressure returned to baseline and stable Postop Assessment: no apparent nausea or vomiting Anesthetic complications: no   No notable events documented.  Last Vitals:  Vitals:   08/12/20 1543 08/12/20 1559  BP: (!) 140/94 (!) 173/98  Pulse:  88  Resp: 18 (!) 22  Temp: (!) 36.4 C   SpO2: 95% 94%                  Pervis Hocking

## 2020-08-12 NOTE — Progress Notes (Signed)
Pt returned to unit from OR.  Pt responds to voice, but still feeling the effects of the nerve block. Pt whispers answers when questioned, falls back asleep easily.  Pt stated pain 5/10 in her knee. Not nauseous.  Right arm in sling with aquacel on right shoulder.  LR running at 100 in left hand.  Pt right hand is warm, dry, cap refill <3 seconds, no movement of fingers yet.  Husband bedside upon return to unit. When he saw she was "out of it," he said he'd let her sleep and come back tomorrow.

## 2020-08-12 NOTE — Progress Notes (Signed)
Paged Ortho PA, Hilbert Odor.  He said it is okay to loosen the knee immobilizer while she is on the bed.  He is letting the physician know about pt + for staph on PCR swab.  He said knee is inoperable.

## 2020-08-12 NOTE — Plan of Care (Signed)
  Problem: Clinical Measurements: Goal: Cardiovascular complication will be avoided Outcome: Progressing   

## 2020-08-12 NOTE — Progress Notes (Signed)
Pt refused to let NT and RN change sheets on bed. We were able to get partial sheet under her bottom that was soaked in urine out from under her, but she refused a new sheet prior to surgery.

## 2020-08-12 NOTE — Anesthesia Procedure Notes (Signed)
Anesthesia Regional Block: Interscalene brachial plexus block   Pre-Anesthetic Checklist: , timeout performed,  Correct Patient, Correct Site, Correct Laterality,  Correct Procedure, Correct Position, site marked,  Risks and benefits discussed,  Surgical consent,  Pre-op evaluation,  At surgeon's request and post-op pain management  Laterality: Right  Prep: Maximum Sterile Barrier Precautions used, chloraprep       Needles:  Injection technique: Single-shot  Needle Type: Echogenic Stimulator Needle     Needle Length: 9cm  Needle Gauge: 22     Additional Needles:   Procedures:,,,, ultrasound used (permanent image in chart),,    Narrative:  Start time: 08/12/2020 12:55 PM End time: 08/12/2020 1:02 PM Injection made incrementally with aspirations every 5 mL.  Performed by: Personally  Anesthesiologist: Freddrick March, MD  Additional Notes: Monitors applied. No increased pain on injection. No increased resistance to injection. Injection made in 5cc increments. Good needle visualization. Patient tolerated procedure well.

## 2020-08-12 NOTE — Progress Notes (Signed)
Progress Note  Day of Surgery  Subjective: Patient with severe nausea and dry heaves overnight and this AM. She reports she struggles with this at home as well but takes phenergan suppositories at home. She reports pain in RUE and does not want to be moved. She also reports that she struggles with PONV - asking about scopolamine patch. Her husband and another family member are at bedside.   Objective: Vital signs in last 24 hours: Temp:  [97.9 F (36.6 C)-98.2 F (36.8 C)] 98 F (36.7 C) (07/01 0544) Pulse Rate:  [74-98] 74 (07/01 0544) Resp:  [16-18] 18 (07/01 0544) BP: (105-138)/(57-75) 138/75 (07/01 0544) SpO2:  [93 %-100 %] 100 % (07/01 0544) Last BM Date: 08/09/20  Intake/Output from previous day: 06/30 0701 - 07/01 0700 In: 3747 [P.O.:80; I.V.:3300; IV Piggyback:367] Out: 500 [Urine:500] Intake/Output this shift: No intake/output data recorded.  PE: General: pleasant, WD, overweight female who is laying in bed and moaning that she need phenergan  HEENT: ecchymosis to nose and bl orbits Heart: regular, rate, and rhythm. Radial pulses 2+ BL, pedal pulses 2+ BL Lungs: CTAB, no wheezes, rhonchi, or rales noted.  Respiratory effort nonlabored Abd: soft, NT, ND, +BS, no masses, hernias, or organomegaly MS: RUE mildly edematous with ecchymosis, R hand NVI; RLE in KI, R foot NVI   Lab Results:  Recent Labs    08/11/20 0217 08/12/20 0147  WBC 11.4* 8.6  HGB 11.3* 9.1*  HCT 35.9* 29.2*  PLT 222 166   BMET Recent Labs    08/11/20 0217 08/12/20 0147  NA 132* 132*  K 3.9 3.7  CL 96* 99  CO2 27 26  GLUCOSE 139* 108*  BUN 18 14  CREATININE 1.20* 0.93  CALCIUM 8.3* 8.0*   PT/INR No results for input(s): LABPROT, INR in the last 72 hours. CMP     Component Value Date/Time   NA 132 (L) 08/12/2020 0147   K 3.7 08/12/2020 0147   CL 99 08/12/2020 0147   CO2 26 08/12/2020 0147   GLUCOSE 108 (H) 08/12/2020 0147   BUN 14 08/12/2020 0147   CREATININE 0.93  08/12/2020 0147   CREATININE 0.90 04/22/2020 1347   CALCIUM 8.0 (L) 08/12/2020 0147   PROT 6.5 08/09/2020 2140   ALBUMIN 3.6 08/09/2020 2140   AST 27 08/09/2020 2140   ALT 20 08/09/2020 2140   ALKPHOS 85 08/09/2020 2140   BILITOT 0.9 08/09/2020 2140   GFRNONAA >60 08/12/2020 0147   GFRNONAA 65 04/22/2020 1347   GFRAA 75 04/22/2020 1347   Lipase     Component Value Date/Time   LIPASE 37 01/06/2016 1441       Studies/Results: No results found.  Anti-infectives: Anti-infectives (From admission, onward)    None        Assessment/Plan GLF Comminuted R humerus fx - OR planned today with Dr. Stann Mainland  R tibial plateau fx and fibula fx - WBAT in [name] per ortho, may need OR pending mobility B/l nasal bone frx - f/u as outpatient with ENT ABL anemia - hgb 9.1, likely somewhat dilutional, will continue to monitor  Chronic nausea - home phenergan suppositories reordered    FEN - NPO, IVF @ 100cc/h DVT - lovenox on hold ABL and likely OR today  ID - no current abx indicated   Dispo - nausea and pain control. OR today   LOS: 2 days    Norm Parcel, [facility] Surgery 08/12/2020, 10:04 AM Please see Amion for pager number  during day hours 7:00am-4:30pm

## 2020-08-12 NOTE — Anesthesia Preprocedure Evaluation (Addendum)
Anesthesia Evaluation  Patient identified by MRN, date of birth, ID band Patient awake    Reviewed: Allergy & Precautions, NPO status , Patient's Chart, lab work & pertinent test results  Airway Mallampati: III  TM Distance: >3 FB Neck ROM: Full    Dental  (+) Teeth Intact, Dental Advisory Given   Pulmonary neg pulmonary ROS,    Pulmonary exam normal breath sounds clear to auscultation       Cardiovascular + CAD and + Past MI  Normal cardiovascular exam Rhythm:Regular Rate:Normal  Stress Test 2018 Blood pressure demonstrated a hypertensive response to exercise. No T wave inversion was noted during stress. There was no ST segment deviation noted during stress. Overall, the patient's exercise capacity was mildly impaired. The patient's response to exercise was inadequate for diagnosis      Neuro/Psych PSYCHIATRIC DISORDERS Anxiety Depression negative neurological ROS     GI/Hepatic Neg liver ROS, GERD  ,  Endo/Other  Hypothyroidism   Renal/GU negative Renal ROS  negative genitourinary   Musculoskeletal  (+) Arthritis ,   Abdominal   Peds  Hematology  (+) Blood dyscrasia (Hgb 9.1), anemia ,   Anesthesia Other Findings H/o Polymyalgia rheumatica on prednisone  Humeral neck fracture after GLF  Reproductive/Obstetrics                            Anesthesia Physical Anesthesia Plan  ASA: 3  Anesthesia Plan: General and Regional   Post-op Pain Management:  Regional for Post-op pain   Induction: Intravenous  PONV Risk Score and Plan: Midazolam, Dexamethasone and Ondansetron  Airway Management Planned: Oral ETT  Additional Equipment:   Intra-op Plan:   Post-operative Plan: Extubation in OR  Informed Consent: I have reviewed the patients History and Physical, chart, labs and discussed the procedure including the risks, benefits and alternatives for the proposed anesthesia with the  patient or authorized representative who has indicated his/her understanding and acceptance.     Dental advisory given  Plan Discussed with: CRNA  Anesthesia Plan Comments:         Anesthesia Quick Evaluation

## 2020-08-12 NOTE — H&P (Signed)
H&P update  The surgical history has been reviewed and remains accurate without interval change.  The patient was re-examined and patient's physiologic condition has not changed significantly in the last 30 days. The condition still exists that makes this procedure necessary. The treatment plan remains the same, without new options for care.  No new pharmacological allergies or types of therapy has been initiated that would change the plan or the appropriateness of the plan.  The patient and/or family understand the potential benefits and risks.  Abrey Bradway P. Stann Mainland, MD 08/12/2020 12:52 PM

## 2020-08-12 NOTE — Anesthesia Procedure Notes (Signed)
Procedure Name: Intubation Date/Time: 08/12/2020 1:25 PM Performed by: Trinna Post., CRNA Pre-anesthesia Checklist: Patient identified, Emergency Drugs available, Suction available, Patient being monitored and Timeout performed Patient Re-evaluated:Patient Re-evaluated prior to induction Oxygen Delivery Method: Circle system utilized Preoxygenation: Pre-oxygenation with 100% oxygen Induction Type: IV induction Ventilation: Mask ventilation without difficulty Laryngoscope Size: Mac and 3 Grade View: Grade I Tube type: Oral Tube size: 7.0 mm Number of attempts: 1 Airway Equipment and Method: Stylet Placement Confirmation: ETT inserted through vocal cords under direct vision, positive ETCO2 and breath sounds checked- equal and bilateral Secured at: 23 cm Tube secured with: Tape Dental Injury: Teeth and Oropharynx as per pre-operative assessment

## 2020-08-12 NOTE — Brief Op Note (Signed)
08/09/2020 - 08/12/2020  3:10 PM  PATIENT:  [name]  70 y.o. female  PRE-OPERATIVE DIAGNOSIS:  right proximal humerus fracture  POST-OPERATIVE DIAGNOSIS:  right proximal humerus fracture  PROCEDURE:  Procedure(s): REVERSE SHOULDER ARTHROPLASTY (Right)  SURGEON:  Surgeon(s) and Role:    * Nicholes Stairs, MD - Primary  PHYSICIAN ASSISTANT: Jonelle Sidle, PA-C  ANESTHESIA:   regional and general  EBL:  150 mL   BLOOD ADMINISTERED:none  DRAINS: none   LOCAL MEDICATIONS USED:  NONE  SPECIMEN:  No Specimen  DISPOSITION OF SPECIMEN:  N/A  COUNTS:  YES  TOURNIQUET:  * No tourniquets in log *  DICTATION: .Note written in EPIC  PLAN OF CARE: Admit to inpatient   PATIENT DISPOSITION:  PACU - hemodynamically stable.   Delay start of Pharmacological VTE agent (>24hrs) due to surgical blood loss or risk of bleeding: not applicable

## 2020-08-12 NOTE — Progress Notes (Signed)
Pt jewelry (2 wedding bands) walked up and handed over to [place]

## 2020-08-12 NOTE — TOC CAGE-AID Note (Signed)
Transition of Care [facility]) - CAGE-AID Screening   Patient Details  Name: [name] MRN: [id number] Date of Birth: 12-07-1950  Transition of Care [facility]) CM/SW Contact:    Clovis Cao, RN Phone Number: [phone] 08/12/2020, 5:36 PM   Clinical Narrative: Pt denies alcohol or drug use.   CAGE-AID Screening:    Have You Ever Felt You Ought to Cut Down on Your Drinking or Drug Use?: No Have People Annoyed You By Critizing Your Drinking Or Drug Use?: No Have You Felt Bad Or Guilty About Your Drinking Or Drug Use?: No Have You Ever Had a Drink or Used Drugs First Thing In The Morning to Steady Your Nerves or to Get Rid of a Hangover?: No CAGE-AID Score: 0  Substance Abuse Education Offered: No

## 2020-08-12 NOTE — Discharge Instructions (Addendum)
Orthopedic discharge instructions:  -Regarding the right lower extremity can be full weightbearing as tolerated.  He should apply ice to the knee around-the-clock for 20 to 30 minutes/h.  You can also range the knee as able.  You can wear a brace as needed as well.  -Right shoulder instructions:  -Maintain sling at all times unless doing activities of daily living or using your walker.  -No lifting with the right arm.  -Maintain postoperative bandage on the shoulder until your follow-up appointment with Dr. Stann Mainland in 2 weeks.  This can get wet in the shower.  Do not submerge underwater.  -Apply ice to the right shoulder for 20 to 30 minutes out of each hour.  -For mild to moderate pain use Tylenol and Advil around-the-clock.  For breakthrough pain use oxycodone as necessary every 4 hours.  -For the prevention of blood clots take an 81 mg aspirin twice per day for 6 weeks.

## 2020-08-12 NOTE — Progress Notes (Signed)
PT Cancellation Note  Patient Details Name: [name] MRN: [id number] DOB: 12-Dec-1950   Cancelled Treatment:    Reason Eval/Treat Not Completed: Patient at procedure or test/unavailable at this time, off unit for surgical repair of RUE. PT will continue to follow and attempt to evaluate following surgery.   Hardie Pulley, DPT   Acute Rehabilitation Department Pager #: [phone]   Otho Bellows 08/12/2020, 12:41 PM

## 2020-08-13 LAB — BASIC METABOLIC PANEL
Anion gap: 8 (ref 5–15)
BUN: 10 mg/dL (ref 8–23)
CO2: 27 mmol/L (ref 22–32)
Calcium: 8.5 mg/dL — ABNORMAL LOW (ref 8.9–10.3)
Chloride: 100 mmol/L (ref 98–111)
Creatinine, Ser: 0.81 mg/dL (ref 0.44–1.00)
GFR, Estimated: >60 mL/min (ref >60)
Glucose, Bld: 114 mg/dL — ABNORMAL HIGH (ref 70–99)
Potassium: 4.2 mmol/L (ref 3.5–5.1)
Sodium: 135 mmol/L (ref 135–145)

## 2020-08-13 LAB — CBC
HCT: 29.6 % — ABNORMAL LOW (ref 36.0–46.0)
Hemoglobin: 9.6 g/dL — ABNORMAL LOW (ref 12.0–15.0)
MCH: 31 pg (ref 26.0–34.0)
MCHC: 32.4 g/dL (ref 30.0–36.0)
MCV: 95.5 fL (ref 80.0–100.0)
Platelets: 182 10*3/uL (ref 150–400)
RBC: 3.1 MIL/uL — ABNORMAL LOW (ref 3.87–5.11)
RDW: 13.1 % (ref 11.5–15.5)
WBC: 10.7 10*3/uL — ABNORMAL HIGH (ref 4.0–10.5)
nRBC: 0 % (ref 0.0–0.2)

## 2020-08-13 MED ORDER — ACETAMINOPHEN 500 MG PO TABS
1000.0000 mg | ORAL_TABLET | Freq: Four times a day (QID) | ORAL | Status: DC
  Administered 2020-08-13 – 2020-08-22 (×30): 1000 mg via ORAL
  Filled 2020-08-13 (×31): qty 2

## 2020-08-13 MED ORDER — HYDROMORPHONE HCL 1 MG/ML IJ SOLN
1.0000 mg | INTRAMUSCULAR | Status: DC | PRN
  Administered 2020-08-13 – 2020-08-16 (×14): 1 mg via INTRAVENOUS
  Filled 2020-08-13 (×14): qty 1

## 2020-08-13 MED ORDER — KETOROLAC TROMETHAMINE 15 MG/ML IJ SOLN
15.0000 mg | Freq: Four times a day (QID) | INTRAMUSCULAR | Status: AC
  Administered 2020-08-13 – 2020-08-14 (×5): 15 mg via INTRAVENOUS
  Filled 2020-08-13 (×5): qty 1

## 2020-08-13 MED ORDER — METHOCARBAMOL 1000 MG/10ML IJ SOLN
1000.0000 mg | Freq: Four times a day (QID) | INTRAVENOUS | Status: DC
  Administered 2020-08-13 – 2020-08-16 (×12): 1000 mg via INTRAVENOUS
  Filled 2020-08-13 (×14): qty 10

## 2020-08-13 MED ORDER — FENTANYL CITRATE (PF) 100 MCG/2ML IJ SOLN
50.0000 ug | INTRAMUSCULAR | Status: DC | PRN
  Administered 2020-08-13: 50 ug via INTRAVENOUS
  Filled 2020-08-13: qty 2

## 2020-08-13 MED ORDER — ACETAMINOPHEN 325 MG PO TABS
650.0000 mg | ORAL_TABLET | Freq: Four times a day (QID) | ORAL | Status: DC
  Administered 2020-08-13: 650 mg via ORAL
  Filled 2020-08-13: qty 2

## 2020-08-13 NOTE — Progress Notes (Signed)
Occupational Therapy Evaluation Patient Details Name: [name] MRN: [id number] DOB: [birth year] Today's Date: 08/13/2020    History of Present Illness 70 y.o. femal who was transferred to [facility] 6/28 after mechanical GLF; landed on her right side, hit her head, no LOC. Now pt with Right tibia plateau fx (non-operative management with KI, WBAT), Right shoulder fx (Reverse shoulder arthroplasty 7/01, okay for WB on RW, no AROM), and facial fractures. PMH: Chronic low back pain with history of L1 compression fracture, MI 2009, osteoporosis, CAD, hypothyroidism, hyperlipidemia, polymyalgia rheumatica   Clinical Impression   [name] was evaluated s/p the above fall and associated impairments. PTA pt was indep in all ADL/IADLs, lives in a 1 level home with 1 STE with her husband who is home 24/7. Upon evaluation, pt max A for bed mobility. Upon sitting EOB pt became very nauseous, dry heaving into emesis bag and then became dizzy. Max A to return supine. Attempted RUE education however pt in pain and limiting participation. Pt would benefit from continued OT acutely to progress OOB mobility and function in ADLs and education of RUE exercises/precautions. Recommend SNF level rehad prior to home to maximize indep in Adls and transfers.     Follow Up Recommendations  SNF;Supervision/Assistance - 24 hour;Follow surgeon's recommendation for DC plan and follow-up therapies    Equipment Recommendations  3 in 1 bedside commode;Other (comment) (pending pt progress for mobility AD needs (rw vs. wc))       Precautions / Restrictions Precautions Precautions: Shoulder;Fall Type of Shoulder Precautions: Reverse shoulder Shoulder Interventions: Shoulder sling/immobilizer;At all times;Off for dressing/bathing/exercises Precaution Booklet Issued: Yes (comment) Precaution Comments: "Sling At all times except ADL / exercise. Non weight bearing. AROM elbow, wrist and hand to tolerance. OK for pendulums. Forward  Flexion 0-90. No Abduction. No AROM of shoulder. ok for WBAT with walker or crutch" Required Braces or Orthoses: Knee Immobilizer - Right;Sling Knee Immobilizer - Right: On at all times Restrictions Weight Bearing Restrictions: Yes RUE Weight Bearing: Non weight bearing (Dr. Rolena Infante: "Patient will be able to weight-bear in the upper and lower extremity.  Recommend utilizing a walker") RLE Weight Bearing: Weight bearing as tolerated      Mobility Bed Mobility Overal bed mobility: Needs Assistance Bed Mobility: Supine to Sit;Sit to Supine     Supine to sit: Max assist;HOB elevated Sit to supine: Max assist;HOB elevated   General bed mobility comments: max A for RLE management, trunk elevation and scooting hips    Transfers Overall transfer level: Needs assistance               General transfer comment: defer this session due to pain and nausea upon sitting EOB        ADL either performed or assessed with clinical judgement   ADL Overall ADL's : Needs assistance/impaired Eating/Feeding: Set up;Bed level   Grooming: Wash/dry hands;Wash/dry face;Oral care;Applying deodorant;Brushing hair;Minimal assistance;Bed level   Upper Body Bathing: Moderate assistance;Sitting   Lower Body Bathing: Maximal assistance;Sitting/lateral leans;Bed level   Upper Body Dressing : Moderate assistance;Sitting   Lower Body Dressing: Maximal assistance;Sitting/lateral leans;Bed level   Toilet Transfer: Maximal assistance;+2 for physical assistance;+2 for safety/equipment;RW   Toileting- Clothing Manipulation and Hygiene: Maximal assistance;+2 for physical assistance;+2 for safety/equipment;Sit to/from stand       Functional mobility during ADLs: Maximal assistance;+2 for physical assistance;+2 for safety/equipment;Rolling walker General ADL Comments: limited to bed level & EOB this session due to pain and nausea; pt requried simplr one step commands for seqeucning  and increased time for  pain management     Vision Patient Visual Report: No change from baseline Vision Assessment?: No apparent visual deficits            Pertinent Vitals/Pain Pain Assessment: 0-10 Pain Score: 10-Worst pain ever Pain Location: R knee > R shoulder Pain Descriptors / Indicators: Discomfort;Grimacing;Guarding;Crying Pain Intervention(s): Monitored during session;Limited activity within patient's tolerance     Hand Dominance Right   Extremity/Trunk Assessment Upper Extremity Assessment Upper Extremity Assessment: RUE deficits/detail RUE Deficits / Details: s/p reverse shoulder arthroplasty RUE: Unable to fully assess due to immobilization RUE Sensation: WNL RUE Coordination: decreased fine motor;decreased gross motor   Lower Extremity Assessment Lower Extremity Assessment: Defer to PT evaluation   Cervical / Trunk Assessment Cervical / Trunk Assessment: Normal   Communication Communication Communication: No difficulties   Cognition Arousal/Alertness: Awake/alert Behavior During Therapy: Anxious Overall Cognitive Status: Within Functional Limits for tasks assessed         General Comments: Pt extremely anxious to attmept movement   General Comments  Pt with nausea upon sitting EOB, dry heaving into emesis bag, reports she has not eaten since monday - RN notified            Home Living Family/patient expects to be discharged to:: Private residence Living Arrangements: Spouse/significant other Available Help at Discharge: Family;Available 24 hours/day Type of Home: House Home Access: Stairs to enter [employer] of Steps: 1 Entrance Stairs-Rails: None Home Layout: One level     Bathroom Shower/Tub: Tub/shower unit;Door   [employer] Toilet: Standard Bathroom Accessibility: Yes How Accessible: Accessible via wheelchair;Accessible via walker Home Equipment: Walker - 2 wheels;Bedside commode          Prior Functioning/Environment Level of  Independence: Independent                 OT Problem List: Decreased strength;Decreased range of motion;Decreased activity tolerance;Impaired balance (sitting and/or standing);Decreased knowledge of use of DME or AE;Decreased knowledge of precautions;Pain;Impaired UE functional use      OT Treatment/Interventions: Self-care/ADL training;Therapeutic exercise;Therapeutic activities;DME and/or AE instruction;Patient/family education;Balance training    OT Goals(Current goals can be found in the care plan section) Acute Rehab OT Goals Patient Stated Goal: less pain OT Goal Formulation: With patient Time For Goal Achievement: 08/27/20 Potential to Achieve Goals: Fair ADL Goals Pt Will Perform Grooming: with modified independence;sitting Pt Will Perform Upper Body Bathing: with min guard assist;sitting Pt Will Perform Lower Body Bathing: with min assist;sit to/from stand Pt Will Perform Upper Body Dressing: with supervision;sitting Pt Will Perform Lower Body Dressing: with min assist;sit to/from stand Pt Will Transfer to Toilet: with min guard assist;ambulating;bedside commode Pt Will Perform Toileting - Clothing Manipulation and hygiene: with supervision;sitting/lateral leans Pt/caregiver will Perform Home Exercise Program: Right Upper extremity;With Supervision;With written HEP provided  OT Frequency: Min 2X/week    AM-PAC OT "6 Clicks" Daily Activity     Outcome Measure Help from another person eating meals?: A Little Help from another person taking care of personal grooming?: A Little Help from another person toileting, which includes using toliet, bedpan, or urinal?: A Lot Help from another person bathing (including washing, rinsing, drying)?: A Lot Help from another person to put on and taking off regular upper body clothing?: A Lot Help from another person to put on and taking off regular lower body clothing?: A Lot 6 Click Score: 14   End of Session Equipment Utilized During  Treatment: Right knee immobilizer Nurse Communication: Mobility status;Precautions;Weight bearing  status (Pt with nausea snd pain)  Activity Tolerance: Patient limited by pain Patient left: in bed;with call bell/phone within reach;with bed alarm set  OT Visit Diagnosis: Other abnormalities of gait and mobility (R26.89);Repeated falls (R29.6);Muscle weakness (generalized) (M62.81);History of falling (Z91.81);Pain Pain - Right/Left: Right                Time: 6945-0388 OT Time Calculation (min): 29 min Charges:  OT General Charges $OT Visit: 1 Visit OT Evaluation $OT Eval Moderate Complexity: 1 Mod OT Treatments $Therapeutic Activity: 8-22 mins    Kellar Westberg A Charon Smedberg 08/13/2020, 9:55 AM

## 2020-08-13 NOTE — Evaluation (Signed)
Physical Therapy Evaluation Patient Details Name: [name] MRN: [id number] DOB: January 11, 1951 Today's Date: 08/13/2020   History of Present Illness  70 y.o. femal who was transferred to [facility] 6/28 after mechanical GLF; landed on her right side, hit her head, no LOC. Now pt with Right tibia plateau fx (non-operative management with KI, WBAT), Right shoulder fx (Reverse shoulder arthroplasty 7/01, okay for WB on RW, no AROM), and facial fractures. PMH: Chronic low back pain with history of L1 compression fracture, MI 2009, osteoporosis, CAD, hypothyroidism, hyperlipidemia, polymyalgia rheumatica  Clinical Impression  PTA, patient reports living with husband and independence with mobility. Patient lethargic upon arrival but easily awakened. Patient reporting little pain laying in bed. Educated patient on role of PT and importance of mobility, patient declined following home and PLOF discussion. Agreeable to allowing PT to assist with repositioning in bed and repositioning of knee immobilizer as it was on backwards. Anticipate patient will need +2 for attempts for OOB mobility due to injuries and limited ability to actively move R LE in supine. Patient will benefit from skilled PT services during acute stay to address listed deficits. Recommend SNF following discharge to maximize functional mobility and safety prior to returning home.     Follow Up Recommendations SNF;Supervision/Assistance - 24 hour    Equipment Recommendations  Rolling Lily Velasquez with 5" wheels;3in1 (PT);Wheelchair (measurements PT);Wheelchair cushion (measurements PT)    Recommendations for Other Services       Precautions / Restrictions Precautions Precautions: Shoulder;Fall Type of Shoulder Precautions: Reverse shoulder Shoulder Interventions: Shoulder sling/immobilizer;At all times;Off for dressing/bathing/exercises Precaution Booklet Issued: Yes (comment) Precaution Comments: "Sling At all times except ADL / exercise. Non  weight bearing. AROM elbow, wrist and hand to tolerance. OK for pendulums. Forward Flexion 0-90. No Abduction. No AROM of shoulder. ok for WBAT with Anasha Perfecto or crutch" Required Braces or Orthoses: Knee Immobilizer - Right;Sling Knee Immobilizer - Right: On at all times Restrictions Weight Bearing Restrictions: Yes RUE Weight Bearing: Non weight bearing (Dr. Rolena Infante: "Patient will be able to weight-bear in the upper and lower extremity.  Recommend utilizing a Bryten Maher") RLE Weight Bearing: Weight bearing as tolerated      Mobility  Bed Mobility               General bed mobility comments: Assisted with readjusting in bed but deferred further mobility due to increased lethargy due to medications. Assisted with repositioning knee immobilizer as it was on backwards.    Transfers                    Ambulation/Gait                Stairs            Wheelchair Mobility    Modified Rankin (Stroke Patients Only)       Balance                                             Pertinent Vitals/Pain Pain Assessment: Faces Faces Pain Scale: Hurts a little bit Pain Location: R LE with movement Pain Descriptors / Indicators: Discomfort;Grimacing;Guarding Pain Intervention(s): Monitored during session    Home Living Family/patient expects to be discharged to:: Private residence Living Arrangements: Spouse/significant other Available Help at Discharge: Family;Available 24 hours/day Type of Home: House Home Access: Stairs to enter Entrance Stairs-Rails: None Entrance Stairs-Number of  Steps: 1 Home Layout: One level Home Equipment: [place] - 2 wheels;Bedside commode      Prior Function Level of Independence: Independent               Hand Dominance   Dominant Hand: Right    Extremity/Trunk Assessment   Upper Extremity Assessment Upper Extremity Assessment: Defer to OT evaluation    Lower Extremity Assessment Lower Extremity  Assessment: RLE deficits/detail RLE Deficits / Details: grossly 2-/5. Able to initiate SLR but unable to lift off bed    Cervical / Trunk Assessment Cervical / Trunk Assessment: Normal  Communication   Communication: No difficulties  Cognition Arousal/Alertness: Lethargic Behavior During Therapy: Flat affect Overall Cognitive Status: Within Functional Limits for tasks assessed                                        General Comments      Exercises     Assessment/Plan    PT Assessment Patient needs continued PT services  PT Problem List Decreased strength;Decreased range of motion;Decreased activity tolerance;Decreased balance;Decreased mobility;Decreased knowledge of use of DME;Decreased safety awareness;Decreased knowledge of precautions       PT Treatment Interventions DME instruction;Gait training;Stair training;Functional mobility training;Therapeutic activities;Balance training;Therapeutic exercise;Patient/family education;Wheelchair mobility training    PT Goals (Current goals can be found in the Care Plan section)  Acute Rehab PT Goals Patient Stated Goal: less pain PT Goal Formulation: With patient Time For Goal Achievement: 08/27/20 Potential to Achieve Goals: Fair    Frequency Min 3X/week   Barriers to discharge        Co-evaluation               AM-PAC PT "6 Clicks" Mobility  Outcome Measure Help needed turning from your back to your side while in a flat bed without using bedrails?: A Lot Help needed moving from lying on your back to sitting on the side of a flat bed without using bedrails?: A Lot Help needed moving to and from a bed to a chair (including a wheelchair)?: Total Help needed standing up from a chair using your arms (e.g., wheelchair or bedside chair)?: Total Help needed to walk in hospital room?: Total Help needed climbing 3-5 steps with a railing? : Total 6 Click Score: 8    End of Session Equipment Utilized During  Treatment: Right knee immobilizer Activity Tolerance: Patient limited by lethargy;Patient limited by pain Patient left: in bed;with call bell/phone within reach;with bed alarm set Nurse Communication: Mobility status PT Visit Diagnosis: Unsteadiness on feet (R26.81);Muscle weakness (generalized) (M62.81);History of falling (Z91.81);Difficulty in walking, not elsewhere classified (R26.2);Pain Pain - Right/Left: Right Pain - part of body: Shoulder;Leg    Time: 9450-3888 PT Time Calculation (min) (ACUTE ONLY): 20 min   Charges:   PT Evaluation $PT Eval Moderate Complexity: 1 Mod          Zahir Eisenhour A. Gilford Rile PT, DPT Acute Rehabilitation Services Pager [phone] Office [phone]   Linna Hoff 08/13/2020, 5:15 PM

## 2020-08-13 NOTE — Plan of Care (Signed)

## 2020-08-13 NOTE — Progress Notes (Signed)
Progress Note  1 Day Post-Op  Subjective: Patient reports pain in RLE and nausea. She reports that she sat up earlier. We discussed leaving foley today with plan to remove tomorrow and she is requesting that she have it left so she does not have to get up.   Objective: Vital signs in last 24 hours: Temp:  [97.2 F (36.2 C)-98.8 F (37.1 C)] 98.1 F (36.7 C) (07/02 0604) Pulse Rate:  [79-111] 80 (07/02 0604) Resp:  [16-25] 18 (07/02 0604) BP: (125-173)/(58-98) 134/68 (07/02 0604) SpO2:  [92 %-100 %] 97 % (07/02 0604) Last BM Date: 09/08/20  Intake/Output from previous day: 07/01 0701 - 07/02 0700 In: 2429.3 [P.O.:240; I.V.:1893; IV Piggyback:296.3] Out: 1350 [Urine:1200; Blood:150] Intake/Output this shift: No intake/output data recorded.  PE: General: pleasant, WD, overweight female who is laying in bed moaning that leg is in pain  HEENT: ecchymosis to nose and bl orbits Heart: regular, rate, and rhythm. Radial pulses 2+ BL, pedal pulses 2+ BL Lungs: CTAB, no wheezes, rhonchi, or rales noted.  Respiratory effort nonlabored Abd: soft, NT, ND, +BS, no masses, hernias, or organomegaly MS: RUE mildly edematous with ecchymosis, R hand NVI; RLE in KI, R foot NVI   Lab Results:  Recent Labs    08/12/20 0147 08/13/20 0253  WBC 8.6 10.7*  HGB 9.1* 9.6*  HCT 29.2* 29.6*  PLT 166 182   BMET Recent Labs    08/12/20 0147 08/13/20 0253  NA 132* 135  K 3.7 4.2  CL 99 100  CO2 26 27  GLUCOSE 108* 114*  BUN 14 10  CREATININE 0.93 0.81  CALCIUM 8.0* 8.5*   PT/INR No results for input(s): LABPROT, INR in the last 72 hours. CMP     Component Value Date/Time   NA 135 08/13/2020 0253   K 4.2 08/13/2020 0253   CL 100 08/13/2020 0253   CO2 27 08/13/2020 0253   GLUCOSE 114 (H) 08/13/2020 0253   BUN 10 08/13/2020 0253   CREATININE 0.81 08/13/2020 0253   CREATININE 0.90 04/22/2020 1347   CALCIUM 8.5 (L) 08/13/2020 0253   PROT 6.5 08/09/2020 2140   ALBUMIN 3.6  08/09/2020 2140   AST 27 08/09/2020 2140   ALT 20 08/09/2020 2140   ALKPHOS 85 08/09/2020 2140   BILITOT 0.9 08/09/2020 2140   GFRNONAA >60 08/13/2020 0253   GFRNONAA 65 04/22/2020 1347   GFRAA 75 04/22/2020 1347   Lipase     Component Value Date/Time   LIPASE 37 01/06/2016 1441       Studies/Results: DG Shoulder Right Port  Result Date: 08/12/2020 CLINICAL DATA:  Status post reverse shoulder arthroplasty EXAM: PORTABLE RIGHT SHOULDER COMPARISON:  08/09/2020 FINDINGS: Changes consistent with shoulder arthroplasty are noted. Prior remnants of comminuted proximal humeral fracture are seen. The underlying bony thorax is within normal limits. No other focal abnormality is noted. IMPRESSION: Status post right shoulder replacement. Electronically Signed   By: Inez Catalina M.D.   On: 08/12/2020 17:20    Anti-infectives: Anti-infectives (From admission, onward)    Start     Dose/Rate Route Frequency Ordered Stop   08/13/20 0000  ceFAZolin (ANCEF) IVPB 1 g/50 mL premix  Status:  Discontinued        1 g 100 mL/hr over 30 Minutes Intravenous Every 6 hours 08/12/20 2306 08/12/20 2310   08/12/20 1302  vancomycin (VANCOCIN) powder  Status:  Discontinued          As needed 08/12/20 1303 08/12/20 1537  08/12/20 1200  ceFAZolin (ANCEF) IVPB 2g/100 mL premix        2 g 200 mL/hr over 30 Minutes Intravenous On call to O.R. 08/12/20 1151 08/12/20 1400        Assessment/Plan GLF Comminuted R humerus fx - s/p reverse shoulder arthroplasty 7/1, per Dr. Stann Mainland, WBAT R tibial plateau fx and fibula fx - WBAT in [place] per ortho, may need OR pending mobility B/l nasal bone frx - f/u as outpatient with ENT ABL anemia - hgb 9.6, stable Chronic nausea - home phenergan suppositories reordered   FEN - reg diet, IVF @ 75cc/h DVT - lovenox  ID - no current abx indicated   Dispo - nausea and pain control. PT/OT. Will leave foley today, but plan to remove tomorrow   LOS: 3 days    Norm Parcel,  [facility] Surgery 08/13/2020, 9:43 AM Please see Amion for pager number during day hours 7:00am-4:30pm

## 2020-08-13 NOTE — Progress Notes (Signed)
    Subjective: 1 Day Post-Op Procedure(s) (LRB): REVERSE SHOULDER ARTHROPLASTY (Right) Patient reports pain as 5 on 0-10 scale.   Denies CP or SOB.  Voiding without difficulty. Positive flatus. Objective: Vital signs in last 24 hours: Temp:  [97.2 F (36.2 C)-98.8 F (37.1 C)] 98.1 F (36.7 C) (07/02 0604) Pulse Rate:  [79-111] 80 (07/02 0604) Resp:  [16-25] 18 (07/02 0604) BP: (125-173)/(58-98) 134/68 (07/02 0604) SpO2:  [92 %-100 %] 97 % (07/02 0604)  Intake/Output from previous day: 07/01 0701 - 07/02 0700 In: 2429.3 [P.O.:240; I.V.:1893; IV Piggyback:296.3] Out: 1350 [Urine:1200; Blood:150] Intake/Output this shift: No intake/output data recorded.  Labs: Recent Labs    08/11/20 0217 08/12/20 0147 08/13/20 0253  HGB 11.3* 9.1* 9.6*   Recent Labs    08/12/20 0147 08/13/20 0253  WBC 8.6 10.7*  RBC 3.01* 3.10*  HCT 29.2* 29.6*  PLT 166 182   Recent Labs    08/12/20 0147 08/13/20 0253  NA 132* 135  K 3.7 4.2  CL 99 100  CO2 26 27  BUN 14 10  CREATININE 0.93 0.81  GLUCOSE 108* 114*  CALCIUM 8.0* 8.5*   No results for input(s): LABPT, INR in the last 72 hours.  Physical Exam: ABD soft Intact pulses distally Incision: dressing C/D/I Compartment soft Body mass index is 29.26 kg/m.   Assessment/Plan: 1 Day Post-Op Procedure(s) (LRB): REVERSE SHOULDER ARTHROPLASTY (Right) Advance diet Up with therapy Patient will be able to weight-bear in the upper and lower extremity.  Recommend utilizing a walker.  She cannot elevate the arm. Continue pain control with oral medications. Possible discharge Monday depending upon how she does with therapy and pain control.   Dahlia Bailiff for Dr. Melina Schools [facility] [phone] 08/13/2020, 8:49 AM

## 2020-08-13 NOTE — Progress Notes (Signed)
Patient handed off to Delorise Jackson RN

## 2020-08-14 LAB — CBC
HCT: 29.5 % — ABNORMAL LOW (ref 36.0–46.0)
Hemoglobin: 9.3 g/dL — ABNORMAL LOW (ref 12.0–15.0)
MCH: 30.6 pg (ref 26.0–34.0)
MCHC: 31.5 g/dL (ref 30.0–36.0)
MCV: 97 fL (ref 80.0–100.0)
Platelets: 193 10*3/uL (ref 150–400)
RBC: 3.04 MIL/uL — ABNORMAL LOW (ref 3.87–5.11)
RDW: 13.5 % (ref 11.5–15.5)
WBC: 8.5 10*3/uL (ref 4.0–10.5)
nRBC: 0 % (ref 0.0–0.2)

## 2020-08-14 LAB — BASIC METABOLIC PANEL
Anion gap: 8 (ref 5–15)
BUN: 16 mg/dL (ref 8–23)
CO2: 27 mmol/L (ref 22–32)
Calcium: 8.4 mg/dL — ABNORMAL LOW (ref 8.9–10.3)
Chloride: 99 mmol/L (ref 98–111)
Creatinine, Ser: 0.96 mg/dL (ref 0.44–1.00)
GFR, Estimated: >60 mL/min (ref >60)
Glucose, Bld: 111 mg/dL — ABNORMAL HIGH (ref 70–99)
Potassium: 3.8 mmol/L (ref 3.5–5.1)
Sodium: 134 mmol/L — ABNORMAL LOW (ref 135–145)

## 2020-08-14 NOTE — Progress Notes (Signed)
Occupational Therapy Treatment Patient Details Name: [name] MRN: [id number] DOB: 03-11-1950 Today's Date: 08/14/2020    History of present illness 70 y.o. femal who was transferred to [facility] 6/28 after mechanical GLF; landed on her right side, hit her head, no LOC. Now pt with Right tibia plateau fx (non-operative management with KI, WBAT), Right shoulder fx (Reverse shoulder arthroplasty 7/01, okay for WB on RW, no AROM), and facial fractures. PMH: Chronic low back pain with history of L1 compression fracture, MI 2009, osteoporosis, CAD, hypothyroidism, hyperlipidemia, polymyalgia rheumatica   OT comments  Keeva is not making functional progress towards her goals, she is limited by pain and nausea. Pt tolerated R elbow, wrist and hand exercises well while supported supine in bed. Pt extremely limited in AROM and strength at elbow, wrist and hand joints, PROM is [facility]. Pt required max A +2 for all bed mobility, and required max encouragement to sustain sitting for ~2 minutes. Pt unable to do functional task while sitting EOB due to pain and nausea. Upon laying spine and re-adjusting in the bed, pt made a self-harm comment, and said "yes" to having thoughts of suicide and "I just cannot live like this." RN and MD aware. Pt benefits from OT acutely to progress rehab of R shoulder and function in all Adls and mobility. D/c plan remains appropriate.    Follow Up Recommendations  SNF;Supervision/Assistance - 24 hour;Follow surgeon's recommendation for DC plan and follow-up therapies    Equipment Recommendations  3 in 1 bedside commode;Other (comment)       Precautions / Restrictions Precautions Precautions: Shoulder;Fall Type of Shoulder Precautions: Reverse shoulder Shoulder Interventions: Shoulder sling/immobilizer;At all times;Off for dressing/bathing/exercises Precaution Booklet Issued: Yes (comment) Precaution Comments: "Sling At all times except ADL / exercise. Non weight bearing. AROM  elbow, wrist and hand to tolerance. OK for pendulums. Forward Flexion 0-90. No Abduction. No AROM of shoulder. ok for WBAT with walker or crutch" Required Braces or Orthoses: Knee Immobilizer - Right;Sling Knee Immobilizer - Right: On at all times Restrictions Weight Bearing Restrictions: Yes RUE Weight Bearing: Non weight bearing (able to weight bear on walker for OOB mobility) RLE Weight Bearing: Weight bearing as tolerated (with KI)       Mobility Bed Mobility Overal bed mobility: Needs Assistance Bed Mobility: Supine to Sit;Sit to Supine     Supine to sit: Max assist Sit to supine: Max assist;+2 for physical assistance;+2 for safety/equipment   General bed mobility comments: max A for all physical movement, +2 helpful as pt does not participate well in mobility due to pain and nausea    Transfers Overall transfer level: Needs assistance               General transfer comment: pt refused        ADL either performed or assessed with clinical judgement   ADL Overall ADL's : Needs assistance/impaired     Grooming: Wash/dry face Grooming Details (indicate cue type and reason): While sitting EOB pt given wash cloth for face, she requested assistance as she was "in too much pain" to life her L arm                             Functional mobility during ADLs:  (session focused on RUE exercises & EOB) General ADL Comments: limited to bed level again this session - reviewed all exercises for RUE      Cognition Arousal/Alertness: Lethargic Behavior During  Therapy: Flat affect Overall Cognitive Status: Within Functional Limits for tasks assessed     General Comments: Pt with reports of suicidal thoughts during this session, made a comment about wanting a "45." After OT directly asked if pt was having thoughs of suicide she stated "Yes. I just cannot live like this." RN present for conversation, and alerted the MD for further phsych evaluation if needed.               General Comments VSS. pt verbalized thoughts of self harm. RN present for conversation. MD aware.    Pertinent Vitals/ Pain       Pain Assessment: Faces Faces Pain Scale: Hurts whole lot Pain Location: R LE with movement Pain Descriptors / Indicators: Discomfort;Grimacing;Guarding Pain Intervention(s): Limited activity within patient's tolerance;Monitored during session   Frequency  Min 2X/week        Progress Toward Goals  OT Goals(current goals can now be found in the care plan section)     Acute Rehab OT Goals Patient Stated Goal: less pain OT Goal Formulation: With patient Time For Goal Achievement: 08/27/20 Potential to Achieve Goals: Fair ADL Goals Pt Will Perform Grooming: with modified independence;sitting Pt Will Perform Upper Body Bathing: with min guard assist;sitting Pt Will Perform Lower Body Bathing: with min assist;sit to/from stand Pt Will Perform Upper Body Dressing: with supervision;sitting Pt Will Perform Lower Body Dressing: with min assist;sit to/from stand Pt Will Transfer to Toilet: with min guard assist;ambulating;bedside commode Pt Will Perform Toileting - Clothing Manipulation and hygiene: with supervision;sitting/lateral leans Pt/caregiver will Perform Home Exercise Program: Right Upper extremity;With Supervision;With written HEP provided  Plan Discharge plan remains appropriate       AM-PAC OT "6 Clicks" Daily Activity     Outcome Measure   Help from another person eating meals?: A Little Help from another person taking care of personal grooming?: A Little Help from another person toileting, which includes using toliet, bedpan, or urinal?: A Lot Help from another person bathing (including washing, rinsing, drying)?: A Lot Help from another person to put on and taking off regular upper body clothing?: A Lot Help from another person to put on and taking off regular lower body clothing?: A Lot 6 Click Score: 14    End of  Session Equipment Utilized During Treatment: Right knee immobilizer  OT Visit Diagnosis: Other abnormalities of gait and mobility (R26.89);Repeated falls (R29.6);Muscle weakness (generalized) (M62.81);History of falling (Z91.81);Pain Pain - Right/Left: Right   Activity Tolerance Patient limited by pain   Patient Left in bed;with call bell/phone within reach;with family/visitor present   Nurse Communication Mobility status;Precautions;Weight bearing status        Time: 1446-1530 OT Time Calculation (min): 44 min  Charges: OT General Charges $OT Visit: 1 Visit OT Treatments $Therapeutic Activity: 23-37 mins $Therapeutic Exercise: 8-22 mins    Frady Taddeo A Vayden Weinand 08/14/2020, 4:03 PM

## 2020-08-14 NOTE — Progress Notes (Signed)
During patient's OT session, patient states she has had suicidal thoughts and comments about a 45. She states she cannot continue to live this way. Dr. Rosendo Gros made aware. Patient's husband at bedside currently. Will continue to monitor.

## 2020-08-14 NOTE — Progress Notes (Signed)
[name]  MRN: [id number] DOB/Age: 1951-01-07 70 y.o. Physician: Ander Slade, [name].D. 2 Days Post-Op Procedure(s) (LRB): REVERSE SHOULDER ARTHROPLASTY (Right)  Subjective: Patient asleep in bed, somnolent, limited cooperation with exam.  Husband reports that she continues to complain of knee pain. Vital Signs Temp:  [97.8 F (36.6 C)-98 F (36.7 C)] 98 F (36.7 C) (07/03 0551) Pulse Rate:  [68-98] 75 (07/03 0551) Resp:  [15-16] 15 (07/03 0551) BP: (115-134)/(67-78) 132/75 (07/03 0551) SpO2:  [98 %] 98 % (07/03 0551)  Lab Results Recent Labs    08/13/20 0253 08/14/20 0111  WBC 10.7* 8.5  HGB 9.6* 9.3*  HCT 29.6* 29.5*  PLT 182 193   BMET Recent Labs    08/13/20 0253 08/14/20 0111  NA 135 134*  K 4.2 3.8  CL 100 99  CO2 27 27  GLUCOSE 114* 111*  BUN 10 16  CREATININE 0.81 0.96  CALCIUM 8.5* 8.4*   No results found for: INR   Exam  Right shoulder Aquacel dressing is clean and dry.  Upper arm compartments are soft.  Limited digital motion on today's exam but she is grossly neurovascular intact.  New mobilizer applied to the right lower extremity with grossly nonfocal neuro exam.  Previous CT scan of the right knee confirms nondisplaced lateral tibial plateau fracture  Plan Continue postop plan per Dr. Stann Mainland regarding right shoulder and right knee.  Recent note confirms plan for nonoperative management of the nondisplaced lateral tibial plateau fracture with patient immobilized in knee immobilizer and may be weightbearing as tolerated.  Discussed these postop plans with the husband who is at bedside.   [name] [name] 08/14/2020, 9:54 AM   Contact # [phone]

## 2020-08-14 NOTE — Progress Notes (Signed)
Progress Note  2 Days Post-Op  Subjective: Patient appears much more comfortable this AM. She worked with therapies yesterday and they recommended SNF, she would prefer to go home. We discussed seeing how she progresses with therapies now that pain is better controlled.  Objective: Vital signs in last 24 hours: Temp:  [97.8 F (36.6 C)-98 F (36.7 C)] 98 F (36.7 C) (07/03 0551) Pulse Rate:  [68-98] 75 (07/03 0551) Resp:  [15-16] 15 (07/03 0551) BP: (115-134)/(67-78) 132/75 (07/03 0551) SpO2:  [98 %] 98 % (07/03 0551) Last BM Date: 08/09/20  Intake/Output from previous day: 07/02 0701 - 07/03 0700 In: 809.3 [I.V.:598.8; IV Piggyback:210.4] Out: 750 [Urine:750] Intake/Output this shift: No intake/output data recorded.  PE: General: pleasant, WD, overweight female who is laying in bed in NAD HEENT: ecchymosis to nose and bl orbits Heart: regular, rate, and rhythm. Radial pulses 2+ BL, pedal pulses 2+ BL Lungs: CTAB, no wheezes, rhonchi, or rales noted.  Respiratory effort nonlabored Abd: soft, NT, ND, +BS, no masses, hernias, or organomegaly MS: RUE mildly edematous with ecchymosis, R hand NVI; RLE in KI, R foot NVI   Lab Results:  Recent Labs    08/13/20 0253 08/14/20 0111  WBC 10.7* 8.5  HGB 9.6* 9.3*  HCT 29.6* 29.5*  PLT 182 193   BMET Recent Labs    08/13/20 0253 08/14/20 0111  NA 135 134*  K 4.2 3.8  CL 100 99  CO2 27 27  GLUCOSE 114* 111*  BUN 10 16  CREATININE 0.81 0.96  CALCIUM 8.5* 8.4*   PT/INR No results for input(s): LABPROT, INR in the last 72 hours. CMP     Component Value Date/Time   NA 134 (L) 08/14/2020 0111   K 3.8 08/14/2020 0111   CL 99 08/14/2020 0111   CO2 27 08/14/2020 0111   GLUCOSE 111 (H) 08/14/2020 0111   BUN 16 08/14/2020 0111   CREATININE 0.96 08/14/2020 0111   CREATININE 0.90 04/22/2020 1347   CALCIUM 8.4 (L) 08/14/2020 0111   PROT 6.5 08/09/2020 2140   ALBUMIN 3.6 08/09/2020 2140   AST 27 08/09/2020 2140    ALT 20 08/09/2020 2140   ALKPHOS 85 08/09/2020 2140   BILITOT 0.9 08/09/2020 2140   GFRNONAA >60 08/14/2020 0111   GFRNONAA 65 04/22/2020 1347   GFRAA 75 04/22/2020 1347   Lipase     Component Value Date/Time   LIPASE 37 01/06/2016 1441       Studies/Results: DG Shoulder Right Port  Result Date: 08/12/2020 CLINICAL DATA:  Status post reverse shoulder arthroplasty EXAM: PORTABLE RIGHT SHOULDER COMPARISON:  08/09/2020 FINDINGS: Changes consistent with shoulder arthroplasty are noted. Prior remnants of comminuted proximal humeral fracture are seen. The underlying bony thorax is within normal limits. No other focal abnormality is noted. IMPRESSION: Status post right shoulder replacement. Electronically Signed   By: Inez Catalina M.D.   On: 08/12/2020 17:20    Anti-infectives: Anti-infectives (From admission, onward)    Start     Dose/Rate Route Frequency Ordered Stop   08/13/20 0000  ceFAZolin (ANCEF) IVPB 1 g/50 mL premix  Status:  Discontinued        1 g 100 mL/hr over 30 Minutes Intravenous Every 6 hours 08/12/20 2306 08/12/20 2310   08/12/20 1302  vancomycin (VANCOCIN) powder  Status:  Discontinued          As needed 08/12/20 1303 08/12/20 1537   08/12/20 1200  ceFAZolin (ANCEF) IVPB 2g/100 mL premix  2 g 200 mL/hr over 30 Minutes Intravenous On call to O.R. 08/12/20 1151 08/12/20 1400        Assessment/Plan GLF Comminuted R humerus fx - s/p reverse shoulder arthroplasty 7/1, per Dr. Stann Mainland, WBAT R tibial plateau fx and fibula fx - WBAT in KI per ortho B/l nasal bone frx - f/u as outpatient with ENT ABL anemia - hgb 9.3, stable Chronic nausea - home phenergan suppositories reordered   FEN - reg diet, SLIV DVT - lovenox  ID - no current abx indicated   Dispo - Pain and nausea seem much improved this AM. Continue PT/OT. DC foley   LOS: 4 days    Norm Parcel, [facility] 08/14/2020, 8:17 AM Please see Amion for pager number during day  hours 7:00am-4:30pm

## 2020-08-15 DIAGNOSIS — W19XXXA Unspecified fall, initial encounter: Secondary | ICD-10-CM

## 2020-08-15 DIAGNOSIS — S4290XA Fracture of unspecified shoulder girdle, part unspecified, initial encounter for closed fracture: Secondary | ICD-10-CM

## 2020-08-15 LAB — CBC
HCT: 27.8 % — ABNORMAL LOW (ref 36.0–46.0)
Hemoglobin: 8.8 g/dL — ABNORMAL LOW (ref 12.0–15.0)
MCH: 30.8 pg (ref 26.0–34.0)
MCHC: 31.7 g/dL (ref 30.0–36.0)
MCV: 97.2 fL (ref 80.0–100.0)
Platelets: 142 10*3/uL — ABNORMAL LOW (ref 150–400)
RBC: 2.86 MIL/uL — ABNORMAL LOW (ref 3.87–5.11)
RDW: 13.7 % (ref 11.5–15.5)
WBC: 12.2 10*3/uL — ABNORMAL HIGH (ref 4.0–10.5)
nRBC: 0 % (ref 0.0–0.2)

## 2020-08-15 LAB — BASIC METABOLIC PANEL
Anion gap: 5 (ref 5–15)
BUN: 14 mg/dL (ref 8–23)
CO2: 27 mmol/L (ref 22–32)
Calcium: 8.4 mg/dL — ABNORMAL LOW (ref 8.9–10.3)
Chloride: 102 mmol/L (ref 98–111)
Creatinine, Ser: 0.82 mg/dL (ref 0.44–1.00)
GFR, Estimated: >60 mL/min (ref >60)
Glucose, Bld: 113 mg/dL — ABNORMAL HIGH (ref 70–99)
Potassium: 4.2 mmol/L (ref 3.5–5.1)
Sodium: 134 mmol/L — ABNORMAL LOW (ref 135–145)

## 2020-08-15 NOTE — Progress Notes (Signed)
Physical Therapy Treatment Patient Details Name: [name] MRN: [id number] DOB: [birth year] Today's Date: 08/15/2020    History of Present Illness 70 y.o. female who was transferred to [facility] 6/28 after mechanical GLF; landed on her right side, hit her head, no LOC. Now pt with Right tibia plateau fx (non-operative management with KI, WBAT), Right shoulder fx (Reverse shoulder arthroplasty 7/01, okay for WB on RW, no AROM), and B nasal bone fractures. PMH: Chronic low back pain with history of L1 compression fracture, MI 2009, osteoporosis, CAD, hypothyroidism, hyperlipidemia, polymyalgia rheumatica    PT Comments    Pt received in supine, sleeping but easily awoken, spouse present but left room for duration of session. Pt with improved participation and activity tolerance this date compared with previous PT session, able to tolerate sitting EOB >10 minutes and performed sit<>stand from elevated bed height but standing tolerance limited to ~30 seconds due to pain/incontinence. Pt requiring up to +17maxA for transfers. Emphasis on compliance with weight bearing precautions/KI and sling, ROMAT R wrist/elbow as pain allows (too painful for elbow ROM this date), supine BLE exercises (see below) and safety with bed mobility/transfers. Pt continues to benefit from PT services to progress toward functional mobility goals.     Follow Up Recommendations  SNF;Supervision/Assistance - 24 hour     Equipment Recommendations  Rolling walker with 5" wheels;3in1 (PT);Wheelchair (measurements PT);Wheelchair cushion (measurements PT) (if pt denies SNF, may need hospital bed and mechanical lift)    Recommendations for Other Services       Precautions / Restrictions Precautions Precautions: Shoulder;Fall Type of Shoulder Precautions: Reverse shoulder Shoulder Interventions: Shoulder sling/immobilizer;At all times;Off for dressing/bathing/exercises Precaution Booklet Issued: Yes (comment) Precaution Comments:  "Sling At all times except ADL / exercise. Non weight bearing. AROM elbow, wrist and hand to tolerance. OK for pendulums. Forward Flexion 0-90. No Abduction. No AROM of shoulder. ok for WBAT with walker or crutch" Required Braces or Orthoses: Knee Immobilizer - Right;Sling Knee Immobilizer - Right: On at all times Restrictions Weight Bearing Restrictions: Yes RUE Weight Bearing: Non weight bearing (pt allowed to WB through RW for functional transfers only) RLE Weight Bearing: Weight bearing as tolerated    Mobility  Bed Mobility Overal bed mobility: Needs Assistance Bed Mobility: Sit to Supine;Rolling;Sidelying to Sit Rolling: Max assist;+2 for physical assistance   Supine to sit: Max assist Sit to supine: Max assist;+2 for physical assistance;+2 for safety/equipment   General bed mobility comments: pt able to assist by progressing LE towards the EOB then bridging hips with LLE support in supine required maxA for trunk elevation to sitting. Pt required max A +2 for sit>supine for trunk and BLE management due to fatigue and hips too close to EOB to try log rolling    Transfers Overall transfer level: Needs assistance Equipment used: Rolling walker (2 wheeled);2 person hand held assist Transfers: Sit to/from Stand Sit to Stand: Max assist;+2 physical assistance;+2 safety/equipment;From elevated surface         General transfer comment: initially pt was total A to boost hips from bed. on the second trial pt was maxA +2 to boost into standing, upon standing pt was mod +2 for balance with RW; totalA for seated scooting toward [facility] with transfer pad assist  Ambulation/Gait             General Gait Details: UTA; pt able to take 1 shuffled step toward [facility] with RLE but unable to deweight LLE to step; incontinent, needing to sit   Stairs  Wheelchair Mobility    Modified Rankin (Stroke Patients Only)       Balance Overall balance assessment: Needs  assistance Sitting-balance support: Single extremity supported;Feet supported Sitting balance-Leahy Scale: Poor Sitting balance - Comments: pt initially needing mod/maxA for trunk support and c/o dizziness; Gradually progressed to min/modA then ultimately to min guard due to impulsivity, pt tending to posterior /right sided lean but able to improve with cues for midline orientation (poor righting reflexes due to lethargy vs dizziness?) Postural control: Posterior lean;Right lateral lean Standing balance support: Single extremity supported Standing balance-Leahy Scale: Zero Standing balance comment: mod/maxA +2 and posterior lean; able to stand only ~30 seconds at RW due to incontinence/fatigue                            Cognition Arousal/Alertness: Suspect due to medications (drowsy) Behavior During Therapy: Flat affect Overall Cognitive Status: Within Functional Limits for tasks assessed                                 General Comments: Pt with depressed affect, implies limited assist at home; Pt spouse reports he is able to physically lift/assist her if needed for mobility. Pt more participatory this date and agreeable to attempt transfers; incontinent although this is not her baseline.      Exercises Other Exercises Other Exercises: supine RUE: wrist flex/ext, finger flex/ext x10 reps Other Exercises: supine LLE AROM: heel slides, hip abduction x10 reps ea Other Exercises: supine BLE AROM: ankle pumps x10 reps and also 10 reps seated EOB Other Exercises: supine bridges with LLE only x5 reps    General Comments General comments (skin integrity, edema, etc.): pt reporting dizziness but BP stable 119/65 supine then BP 133/72 seated EOB; also dizzy in stance but incontinent and poor standing tolerance UTA standing BP      Pertinent Vitals/Pain Pain Assessment: Faces Faces Pain Scale: Hurts even more Pain Location: R LE with movement, hip, back Pain  Descriptors / Indicators: Discomfort;Grimacing;Guarding Pain Intervention(s): Limited activity within patient's tolerance;Monitored during session;Premedicated before session;Repositioned;Patient requesting pain meds-RN notified    Home Living                      Prior Function            PT Goals (current goals can now be found in the care plan section) Acute Rehab PT Goals Patient Stated Goal: less pain PT Goal Formulation: With patient Time For Goal Achievement: 08/27/20 Progress towards PT goals: Progressing toward goals    Frequency    Min 3X/week      PT Plan Current plan remains appropriate    Co-evaluation PT/OT/SLP Co-Evaluation/Treatment: Yes Reason for Co-Treatment: Complexity of the patient's impairments (multi-system involvement);Necessary to address cognition/behavior during functional activity;For patient/therapist safety;To address functional/ADL transfers PT goals addressed during session: Mobility/safety with mobility;Balance;Proper use of DME;Strengthening/ROM OT goals addressed during session: ADL's and self-care;Proper use of Adaptive equipment and DME      AM-PAC PT "6 Clicks" Mobility   Outcome Measure  Help needed turning from your back to your side while in a flat bed without using bedrails?: A Lot Help needed moving from lying on your back to sitting on the side of a flat bed without using bedrails?: A Lot Help needed moving to and from a bed to a chair (including a wheelchair)?: Total Help needed  standing up from a chair using your arms (e.g., wheelchair or bedside chair)?: Total Help needed to walk in hospital room?: Total Help needed climbing 3-5 steps with a railing? : Total 6 Click Score: 8    End of Session Equipment Utilized During Treatment: Right knee immobilizer;Gait belt;Other (comment) (O2 removed at beginning of session SpO2 WNL on RA) Activity Tolerance: Patient tolerated treatment well;Patient limited by pain Patient  left: in bed;with call bell/phone within reach;with bed alarm set;Other (comment) (heels floated, bed in chair position) Nurse Communication: Mobility status;Patient requests pain meds;Other (comment);Need for lift equipment (pt spouse instructed on how to order lunch/dinner for her; likely needs hoyer for OOB) PT Visit Diagnosis: Unsteadiness on feet (R26.81);Muscle weakness (generalized) (M62.81);History of falling (Z91.81);Difficulty in walking, not elsewhere classified (R26.2);Pain Pain - Right/Left: Right Pain - part of body: Shoulder;Leg     Time: 0623-7628 PT Time Calculation (min) (ACUTE ONLY): 36 min  Charges:  $Therapeutic Exercise: 8-22 mins                     Tracy Kinner P., PTA Acute Rehabilitation Services Pager: [phone] Office: [place] 08/15/2020, 4:55 PM

## 2020-08-15 NOTE — Progress Notes (Signed)
Progress Note  3 Days Post-Op  Subjective: Patient reports pain in back this AM. Husband reports he does not feel like he could help her at home the way she is currently.   Objective: Vital signs in last 24 hours: Temp:  [98.1 F (36.7 C)-98.6 F (37 C)] 98.2 F (36.8 C) (07/04 0355) Pulse Rate:  [87-100] 94 (07/04 0355) Resp:  [18-20] 18 (07/04 0355) BP: (113-144)/(69-84) 133/74 (07/04 0355) SpO2:  [91 %-97 %] 97 % (07/04 0355) Last BM Date: 08/09/20 (PTA)  Intake/Output from previous day: 07/03 0701 - 07/04 0700 In: -  Out: 700 [Urine:700] Intake/Output this shift: Total I/O In: 550 [IV Piggyback:550] Out: -   PE: General: WD, overweight female who is laying in bed in NAD HEENT: ecchymosis to nose and bl orbits Heart: regular, rate, and rhythm. Radial pulses 2+ BL, pedal pulses 2+ BL Lungs: CTAB, no wheezes, rhonchi, or rales noted.  Respiratory effort nonlabored Abd: soft, NT, ND, +BS, no masses, hernias, or organomegaly MS: RUE mildly edematous with ecchymosis, R hand NVI; RLE in KI, R foot NVI Psych: denies SI this AM   Lab Results:  Recent Labs    08/14/20 0111 08/15/20 0053  WBC 8.5 12.2*  HGB 9.3* 8.8*  HCT 29.5* 27.8*  PLT 193 142*   BMET Recent Labs    08/14/20 0111 08/15/20 0053  NA 134* 134*  K 3.8 4.2  CL 99 102  CO2 27 27  GLUCOSE 111* 113*  BUN 16 14  CREATININE 0.96 0.82  CALCIUM 8.4* 8.4*   PT/INR No results for input(s): LABPROT, INR in the last 72 hours. CMP     Component Value Date/Time   NA 134 (L) 08/15/2020 0053   K 4.2 08/15/2020 0053   CL 102 08/15/2020 0053   CO2 27 08/15/2020 0053   GLUCOSE 113 (H) 08/15/2020 0053   BUN 14 08/15/2020 0053   CREATININE 0.82 08/15/2020 0053   CREATININE 0.90 04/22/2020 1347   CALCIUM 8.4 (L) 08/15/2020 0053   PROT 6.5 08/09/2020 2140   ALBUMIN 3.6 08/09/2020 2140   AST 27 08/09/2020 2140   ALT 20 08/09/2020 2140   ALKPHOS 85 08/09/2020 2140   BILITOT 0.9 08/09/2020 2140    GFRNONAA >60 08/15/2020 0053   GFRNONAA 65 04/22/2020 1347   GFRAA 75 04/22/2020 1347   Lipase     Component Value Date/Time   LIPASE 37 01/06/2016 1441       Studies/Results: No results found.  Anti-infectives: Anti-infectives (From admission, onward)    Start     Dose/Rate Route Frequency Ordered Stop   08/13/20 0000  ceFAZolin (ANCEF) IVPB 1 g/50 mL premix  Status:  Discontinued        1 g 100 mL/hr over 30 Minutes Intravenous Every 6 hours 08/12/20 2306 08/12/20 2310   08/12/20 1302  vancomycin (VANCOCIN) powder  Status:  Discontinued          As needed 08/12/20 1303 08/12/20 1537   08/12/20 1200  ceFAZolin (ANCEF) IVPB 2g/100 mL premix        2 g 200 mL/hr over 30 Minutes Intravenous On call to O.R. 08/12/20 1151 08/12/20 1400        Assessment/Plan GLF Comminuted R humerus fx - s/p reverse shoulder arthroplasty 7/1, per Dr. Stann Mainland, WBAT R tibial plateau fx and fibula fx - WBAT in [place] per ortho B/l nasal bone frx - f/u as outpatient with ENT ABL anemia - hgb 8.8, stable Chronic nausea -  home phenergan suppositories reordered Suicidal comments - denies SI this AM, psych consulted    FEN - reg diet, SLIV DVT - lovenox  ID - no current abx indicated   Dispo - ordered K pad for back, encouraged patient to try taking oxy over IV pain meds. Continue therapies, working on SNF placement   LOS: 5 days    Norm Parcel, [facility] Surgery 08/15/2020, 10:42 AM Please see Amion for pager number during day hours 7:00am-4:30pm

## 2020-08-15 NOTE — Progress Notes (Signed)
Occupational Therapy Treatment Patient Details Name: [name] MRN: [id number] DOB: 10/26/1950 Today's Date: 08/15/2020    History of present illness 70 y.o. femal who was transferred to [facility] 6/28 after mechanical GLF; landed on her right side, hit her head, no LOC. Now pt with Right tibia plateau fx (non-operative management with KI, WBAT), Right shoulder fx (Reverse shoulder arthroplasty 7/01, okay for WB on RW, no AROM), and facial fractures. PMH: Chronic low back pain with history of L1 compression fracture, MI 2009, osteoporosis, CAD, hypothyroidism, hyperlipidemia, polymyalgia rheumatica   OT comments  [name] is progressing incrementally towards her goals. Pt has better pain and nausea management today, and tolerated mobility well. Pt was max A +2 for bed mobility, and min guard/minA for static sitting balance. Pt reported some dizziness upon sitting, VSS on RA throughout. Overall, pt was max A +2 for sit<>stand with rw, and unable to laterally step/weight shift. Upon standing, pt urinated and required total A for pericare. Pt continues to benefit from continued OT acutely to maximize function in ADLs and mobility. D/c recommendation remains appropriate.    Follow Up Recommendations  SNF;Supervision/Assistance - 24 hour;Follow surgeon's recommendation for DC plan and follow-up therapies    Equipment Recommendations  3 in 1 bedside commode;Other (comment)       Precautions / Restrictions Precautions Precautions: Shoulder;Fall Type of Shoulder Precautions: Reverse shoulder Shoulder Interventions: Shoulder sling/immobilizer;At all times;Off for dressing/bathing/exercises Precaution Booklet Issued: Yes (comment) Precaution Comments: "Sling At all times except ADL / exercise. Non weight bearing. AROM elbow, wrist and hand to tolerance. OK for pendulums. Forward Flexion 0-90. No Abduction. No AROM of shoulder. ok for WBAT with walker or crutch" Required Braces or Orthoses: Knee Immobilizer  - Right;Sling Knee Immobilizer - Right: On at all times Restrictions Weight Bearing Restrictions: Yes RUE Weight Bearing: Non weight bearing (Pt allow to weight bear throguh rw for functional transfers) RLE Weight Bearing: Weight bearing as tolerated       Mobility Bed Mobility Overal bed mobility: Needs Assistance Bed Mobility: Supine to Sit;Sit to Supine     Supine to sit: Max assist Sit to supine: Max assist;+2 for physical assistance;+2 for safety/equipment   General bed mobility comments: pt able to assist by progressing LE towards the EOB & boosting her hips while supine, required maxA fro trunk elevation to sitting. Pt required max A +2 for sit>supine for trunk and BLE management    Transfers Overall transfer level: Needs assistance Equipment used: Rolling walker (2 wheeled);2 person hand held assist Transfers: Sit to/from Stand Sit to Stand: Max assist;+2 physical assistance;+2 safety/equipment;From elevated surface         General transfer comment: initially pt was total A to boost hips from bed. on the second trial pt was maxA +2 to boost into standing, upon standing pt was mod +2 for balance with rw        ADL either performed or assessed with clinical judgement   ADL Overall ADL's : Needs assistance/impaired                         Toilet Transfer: Maximal assistance;+2 for physical assistance;+2 for safety/equipment;RW   Toileting- Clothing Manipulation and Hygiene: Total assistance       Functional mobility during ADLs: Maximal assistance;+2 for physical assistance;Cueing for safety General ADL Comments: pt total assist for pericare in standing/sitting      Cognition Arousal/Alertness: Lethargic Behavior During Therapy: Flat affect Overall Cognitive Status: Within Functional  Limits for tasks assessed             General Comments VSS on RA    Pertinent Vitals/ Pain       Pain Assessment: Faces Faces Pain Scale: Hurts even  more Pain Location: R LE with movement, hip, back Pain Descriptors / Indicators: Discomfort;Grimacing;Guarding Pain Intervention(s): Limited activity within patient's tolerance;Monitored during session   Frequency  Min 2X/week        Progress Toward Goals  OT Goals(current goals can now be found in the care plan section)  Progress towards OT goals: Progressing toward goals  Acute Rehab OT Goals Patient Stated Goal: less pain OT Goal Formulation: With patient Time For Goal Achievement: 08/27/20 Potential to Achieve Goals: Fair ADL Goals Pt Will Perform Grooming: with modified independence;sitting Pt Will Perform Upper Body Bathing: with min guard assist;sitting Pt Will Perform Lower Body Bathing: with min assist;sit to/from stand Pt Will Perform Upper Body Dressing: with supervision;sitting Pt Will Perform Lower Body Dressing: with min assist;sit to/from stand Pt Will Transfer to Toilet: with min guard assist;ambulating;bedside commode Pt Will Perform Toileting - Clothing Manipulation and hygiene: with supervision;sitting/lateral leans Pt/caregiver will Perform Home Exercise Program: Right Upper extremity;With Supervision;With written HEP provided  Plan Discharge plan remains appropriate    Co-evaluation    PT/OT/SLP Co-Evaluation/Treatment: Yes Reason for Co-Treatment: Complexity of the patient's impairments (multi-system involvement);For patient/therapist safety;To address functional/ADL transfers   OT goals addressed during session: ADL's and self-care;Proper use of Adaptive equipment and DME      AM-PAC OT "6 Clicks" Daily Activity     Outcome Measure   Help from another person eating meals?: A Little Help from another person taking care of personal grooming?: A Little Help from another person toileting, which includes using toliet, bedpan, or urinal?: A Lot Help from another person bathing (including washing, rinsing, drying)?: A Lot Help from another person to  put on and taking off regular upper body clothing?: A Lot Help from another person to put on and taking off regular lower body clothing?: A Lot 6 Click Score: 14    End of Session Equipment Utilized During Treatment: Gait belt;Rolling walker;Right knee immobilizer  OT Visit Diagnosis: Other abnormalities of gait and mobility (R26.89);Repeated falls (R29.6);Muscle weakness (generalized) (M62.81);History of falling (Z91.81);Pain Pain - Right/Left: Right   Activity Tolerance Patient limited by pain   Patient Left in bed;with call bell/phone within reach;with bed alarm set (chair position)   Nurse Communication Mobility status        Time: 2707-8675 OT Time Calculation (min): 36 min  Charges: OT General Charges $OT Visit: 1 Visit OT Treatments $Self Care/Home Management : 8-22 mins     Jakiya Bookbinder A Jesslynn Kruck 08/15/2020, 4:13 PM

## 2020-08-15 NOTE — Progress Notes (Signed)
[name]  MRN: [id number] DOB/Age: 1950/11/21 70 y.o. Physician: Ander Slade, M.D. 3 Days Post-Op Procedure(s) (LRB): REVERSE SHOULDER ARTHROPLASTY (Right)  Subjective: Patient lying in bed complaining of severe back pain secondary to being in bed Vital Signs Temp:  [98.1 F (36.7 C)-98.6 F (37 C)] 98.2 F (36.8 C) (07/04 0355) Pulse Rate:  [87-100] 94 (07/04 0355) Resp:  [18-20] 18 (07/04 0355) BP: (113-144)/(69-84) 133/74 (07/04 0355) SpO2:  [91 %-97 %] 97 % (07/04 0355)  Lab Results Recent Labs    08/14/20 0111 08/15/20 0053  WBC 8.5 12.2*  HGB 9.3* 8.8*  HCT 29.5* 27.8*  PLT 193 142*    BMET Recent Labs    08/14/20 0111 08/15/20 0053  NA 134* 134*  K 3.8 4.2  CL 99 102  CO2 27 27  GLUCOSE 111* 113*  BUN 16 14  CREATININE 0.96 0.82  CALCIUM 8.4* 8.4*    No results found for: INR   Exam  Right shoulder Aquacel dressing is clean and dry.  Upper arm compartments are soft.  Good radial pulse. Grip strength intact  Knee mobilizer applied to the right lower extremity with grossly nonfocal neuro exam.  Previous CT scan of the right knee confirms nondisplaced lateral tibial plateau fracture  Plan Continue postop plan per Dr. Stann Mainland regarding right shoulder and right knee.  Recent note confirms plan for nonoperative management of the nondisplaced lateral tibial plateau fracture with patient immobilized in knee immobilizer and may be weightbearing as tolerated. Continue to work on pain control    [name] 08/15/2020, 10:17 AM   Contact # [phone]

## 2020-08-15 NOTE — Progress Notes (Signed)
Patient declines lunch, Will try to eat something for dinner.

## 2020-08-15 NOTE — Consult Note (Signed)
[place] [facility] Consult   Reason for Consult:  Reporting SI Referring Physician:  Ralene Ok, MD Patient Identification: [name] MRN:  [id number] Principal Diagnosis: <principal problem not specified> Diagnosis:  Active Problems:   Shoulder fracture   Fall   Total Time spent with patient: 20 minutes  Subjective:   [name] is a 70 y.o. female patient admitted with s/p mechanical GLF after tripping. Landed on her right side, hit her head, no LOC. Patient has a PPH of depression.   HPI:  On assessment patient reports that she is in a lot of pain since her fall and she has been saying that she wishes to die, but she does not really mean this and has no intent on ending her life. Patient's husband is in the room and reports that patient has only been making these statements since her fall and does not feel the patient is a danger to herself. Patient denies depressed mood prior to her fall and reports that if she were able to move a bit more she would feel better. Patient denies anhedonia, feelings of guilt/ hopelessness/ decreased motivation, and her energy level was om prior to her fall but she her pain is curently restricting her mobility. Patient does report she is eating less 2/2 to the pain. 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 Patient reports that she has been taking her Paxil for 57 y and feels that it controls her mood well and both she and husband do not think patient needs any adjustment to her medications.  Patient denies symptoms of mania, PTSD, and SI,  HI and AVH.   Past Psychiatric History: Dx w/ MDD, taking Paxil for 48 y  Risk to Self:  NO Risk to Others:  NO Prior Inpatient Therapy:  NO Prior Outpatient Therapy:  Paxil prescribed by PCP  Past Medical History:  Past  Medical History:  Diagnosis Date   Anxiety    Arthritis    CAD (coronary artery disease)    Cataract    Depression    DISORDER, MENOPAUSAL NOS 03/20/2007   Qualifier: Diagnosis of  By: Arnoldo Morale MD, John E    GERD (gastroesophageal reflux disease)    Hyperlipidemia    Hypothyroidism    MYOCARDIAL INFARCTION, HX OF 09/09/2007   Qualifier: Diagnosis of  By: Leanne Chang MD, Bruce     Osteoporosis    Polymyalgia rheumatica [facility])     Past Surgical History:  Procedure Laterality Date   CATARACT EXTRACTION     CESAREAN SECTION     CHOLECYSTECTOMY N/A 11/24/2012   Procedure: LAPAROSCOPIC CHOLECYSTECTOMY;  Surgeon: Ralene Ok, MD;  Location: [facility] OR;  Service: General;  Laterality: N/A;   COSMETIC SURGERY     EYE SURGERY     eye lids lifted   PTCA     Family History:  Family History  Problem Relation Age of Onset   Hypertension Father    Heart disease Father        Mi age 85, 64, 50 and 2.   Cancer Father    Hyperlipidemia Father    Colon cancer Maternal Uncle    Hypertension Mother    Cancer Mother    Hyperlipidemia Mother    Pulmonary fibrosis Sister    Epilepsy Son    Healthy  Son    Myasthenia gravis Son    Healthy Son    Rheum arthritis Niece    Lupus Niece    Rheum arthritis Niece    Family Psychiatric  History: Sister had depression and required multiple medications over her lifetime Social History:  Social History   Substance and Sexual Activity  Alcohol Use No   Alcohol/week: 0.0 standard drinks     Social History   Substance and Sexual Activity  Drug Use No    Social History   Socioeconomic History   Marital status: Married    Spouse name: Not on file   Number of children: Not on file   Years of education: Not on file   Highest education level: Not on file  Occupational History   Not on file  Tobacco Use   Smoking status: Never   Smokeless tobacco: Never  Vaping Use   Vaping Use: Never used  Substance and Sexual Activity   Alcohol use: No     Alcohol/week: 0.0 standard drinks   Drug use: No   Sexual activity: Not on file  Other Topics Concern   Not on file  Social History Narrative   Husband and 2 adult children live at home. No grandkids. Son with epilepsy.      Retired at age 69    [place]- at [employer].     Social Determinants of Health   Financial Resource Strain: Not on file  Food Insecurity: Not on file  Transportation Needs: Not on file  Physical Activity: Not on file  Stress: Not on file  Social Connections: Not on file   Additional Social History:    Allergies:   Allergies  Allergen Reactions   Morphine And Related Nausea And Vomiting   Prochlorperazine     REACTION: nerve reaction   Simvastatin     REACTION: leg cramps   Sulfa Antibiotics     Labs:  Results for orders placed or performed during the hospital encounter of 08/09/20 (from the past 48 hour(s))  CBC     Status: Abnormal   Collection Time: 08/14/20  1:11 AM  Result Value Ref Range   WBC 8.5 4.0 - 10.5 K/uL   RBC 3.04 (L) 3.87 - 5.11 MIL/uL   Hemoglobin 9.3 (L) 12.0 - 15.0 g/dL   HCT 29.5 (L) 36.0 - 46.0 %   MCV 97.0 80.0 - 100.0 fL   MCH 30.6 26.0 - 34.0 pg   MCHC 31.5 30.0 - 36.0 g/dL   RDW 13.5 11.5 - 15.5 %   Platelets 193 150 - 400 K/uL   nRBC 0.0 0.0 - 0.2 %    Comment: Performed at [facility], [place] [address], [place],  [address]  Basic metabolic panel     Status: Abnormal   Collection Time: 08/14/20  1:11 AM  Result Value Ref Range   Sodium 134 (L) 135 - 145 mmol/L   Potassium 3.8 3.5 - 5.1 mmol/L   Chloride 99 98 - 111 mmol/L   CO2 27 22 - 32 mmol/L   Glucose, Bld 111 (H) 70 - 99 mg/dL    Comment: Glucose reference range applies only to samples taken after fasting for at least 8 hours.   BUN 16 8 - 23 mg/dL   Creatinine, Ser 0.96 0.44 - 1.00 mg/dL   Calcium 8.4 (L) 8.9 - 10.3 mg/dL   GFR, Estimated >60 >60 mL/min    Comment: (NOTE) Calculated using the CKD-EPI Creatinine Equation (2021)  Anion  gap 8 5 - 15    Comment: Performed at [facility] [address] [place], [place] [address]  CBC     Status: Abnormal   Collection Time: 08/15/20 12:53 AM  Result Value Ref Range   WBC 12.2 (H) 4.0 - 10.5 K/uL   RBC 2.86 (L) 3.87 - 5.11 MIL/uL   Hemoglobin 8.8 (L) 12.0 - 15.0 g/dL   HCT 27.8 (L) 36.0 - 46.0 %   MCV 97.2 80.0 - 100.0 fL   MCH 30.8 26.0 - 34.0 pg   MCHC 31.7 30.0 - 36.0 g/dL   RDW 13.7 11.5 - 15.5 %   Platelets 142 (L) 150 - 400 K/uL   nRBC 0.0 0.0 - 0.2 %    Comment: Performed at [facility], [place] [address], [place], [place] [address]  Basic metabolic panel     Status: Abnormal   Collection Time: 08/15/20 12:53 AM  Result Value Ref Range   Sodium 134 (L) 135 - 145 mmol/L   Potassium 4.2 3.5 - 5.1 mmol/L   Chloride 102 98 - 111 mmol/L   CO2 27 22 - 32 mmol/L   Glucose, Bld 113 (H) 70 - 99 mg/dL    Comment: Glucose reference range applies only to samples taken after fasting for at least 8 hours.   BUN 14 8 - 23 mg/dL   Creatinine, Ser 0.82 0.44 - 1.00 mg/dL   Calcium 8.4 (L) 8.9 - 10.3 mg/dL   GFR, Estimated >60 >60 mL/min    Comment: (NOTE) Calculated using the CKD-EPI Creatinine Equation (2021)    Anion gap 5 5 - 15    Comment: Performed at [facility] [address] [place],  [address]    Current Facility-Administered Medications  Medication Dose Route Frequency Provider Last Rate Last Admin   acetaminophen (TYLENOL) tablet 1,000 mg  1,000 mg Oral Q6H Norm Parcel, PA-C   1,000 mg at 08/15/20 7494   Chlorhexidine Gluconate Cloth 2 % PADS 6 each  6 each Topical Q0600 Nicholes Stairs, MD   6 each at 08/15/20 0650   cholecalciferol (VITAMIN D3) tablet 1,000 Units  1,000 Units Oral QHS Nicholes Stairs, MD   1,000 Units at 08/14/20 2150   docusate sodium (COLACE) capsule 100 mg  100 mg Oral BID Nicholes Stairs, MD   100 mg at 08/15/20 0947   enoxaparin (LOVENOX) injection 30 mg  30 mg Subcutaneous Q12H Nicholes Stairs, MD   30 mg at 08/15/20 0947   HYDROmorphone (DILAUDID) injection 1 mg  1 mg Intravenous Q2H PRN Norm Parcel, PA-C   1 mg at 08/15/20 4967   levothyroxine (SYNTHROID) tablet 100 mcg  100 mcg Oral QHS Nicholes Stairs, MD   100 mcg at 08/14/20 2150   menthol-cetylpyridinium (CEPACOL) lozenge 3 mg  1 lozenge Oral PRN Nicholes Stairs, MD       Or   phenol [facility]) mouth spray 1 spray  1 spray Mouth/Throat PRN Nicholes Stairs, MD       methocarbamol (ROBAXIN) 1,000 mg in dextrose 5 % 100 mL IVPB  1,000 mg Intravenous QID Norm Parcel, PA-C 200 mL/hr at 08/15/20 0946 1,000 mg at 08/15/20 0946   metoCLOPramide (REGLAN) tablet 5-10 mg  5-10 mg Oral Q8H PRN Nicholes Stairs, MD       Or   metoCLOPramide (REGLAN) injection 5-10 mg  5-10 mg Intravenous Q8H PRN Victorino December  Saralyn Pilar, MD       mupirocin ointment (BACTROBAN) 2 % 1 application  1 application Nasal BID Nicholes Stairs, MD   1 application at 71/21/97 0949   ondansetron (ZOFRAN-ODT) disintegrating tablet 4 mg  4 mg Oral Q6H PRN Nicholes Stairs, MD       Or   ondansetron [facility]) injection 4 mg  4 mg Intravenous Q6H PRN Nicholes Stairs, MD   4 mg at 08/14/20 1336   oxyCODONE (Oxy IR/ROXICODONE) immediate release tablet 5-10 mg  5-10 mg Oral Q4H PRN Nicholes Stairs, MD   10 mg at 08/13/20 1039   pantoprazole (PROTONIX) EC tablet 40 mg  40 mg Oral QHS Nicholes Stairs, MD   40 mg at 08/14/20 2150   PARoxetine (PAXIL) tablet 20 mg  20 mg Oral QHS Nicholes Stairs, MD   20 mg at 08/14/20 2150   predniSONE (DELTASONE) tablet 5 mg  5 mg Oral QHS Nicholes Stairs, MD   5 mg at 08/14/20 2150   promethazine (PHENERGAN) 12.5 mg in sodium chloride 0.9 % 50 mL IVPB  12.5 mg Intravenous Q6H PRN Nicholes Stairs, MD 200 mL/hr at 08/14/20 1126 12.5 mg at 08/14/20 1126   Or   promethazine (PHENERGAN) suppository 25 mg  25 mg Rectal Q6H PRN Nicholes Stairs, MD   25  mg at 08/14/20 1733   scopolamine (TRANSDERM-SCOP) 1 MG/3DAYS 1.5 mg  1 patch Transdermal Q72H Nicholes Stairs, MD   1.5 mg at 08/15/20 5883   Teriparatide (Recombinant) SOPN 20 mcg  20 mcg Subcutaneous QHS Nicholes Stairs, MD   20 mcg at 08/14/20 2243   Vitamin D (Ergocalciferol) (DRISDOL) capsule 50,000 Units  50,000 Units Oral Q Peyton Bottoms, Elly Modena, MD   50,000 Units at 08/15/20 2549    Musculoskeletal: Strength & Muscle Tone:  patient did not move much during her assessment but is able to bring drinks to her mouth Gait & Station:  remains in bed on exam Patient leans: N/A            Psychiatric Specialty Exam:  Presentation  General Appearance: Appropriate for Environment  Eye Contact:Good  Speech:Clear and Coherent  Speech Volume:Normal  Handedness: No data recorded  Mood and Affect  Mood:Irritable  Affect:Congruent   Thought Process  Thought Processes:Coherent  Descriptions of Associations:Intact  Orientation:Full (Time, Place and Person)  Thought Content:Logical  History of Schizophrenia/Schizoaffective disorder:No data recorded Duration of Psychotic Symptoms:No data recorded Hallucinations:Hallucinations: None  Ideas of Reference:None  Suicidal Thoughts:Suicidal Thoughts: Yes, Passive SI Passive Intent and/or Plan: Without Intent  Homicidal Thoughts:Homicidal Thoughts: No   Sensorium  Memory:Immediate Good; Recent Good; Remote Good  Judgment:Fair  Insight:Fair   Executive Functions  Concentration:Fair  Attention Span:Good  Recall:Good  Fund of Knowledge:Good  Language:Good   Psychomotor Activity  Psychomotor Activity:Psychomotor Activity: Normal   Assets  Assets:Communication Skills; Resilience; Social Support; Desire for Improvement; Financial Resources/Insurance; Housing; Intimacy   Sleep  Sleep:Sleep: Poor   Physical Exam: Physical Exam HENT:     Head: Normocephalic.  Eyes:     Extraocular  Movements: Extraocular movements intact.     Conjunctiva/sclera: Conjunctivae normal.  Cardiovascular:     Rate and Rhythm: Normal rate.  Pulmonary:     Effort: Pulmonary effort is normal.     Breath sounds: Normal breath sounds.  Abdominal:     General: Abdomen is flat.  Musculoskeletal:        General: Tenderness present.  Skin:    General: Skin is warm and dry.     Findings: Bruising present.  Neurological:     Mental Status: She is alert and oriented to person, place, and time.   Review of Systems  Constitutional:  Negative for chills and fever.  HENT:  Negative for hearing loss.   Eyes:  Negative for blurred vision.  Respiratory:  Negative for cough and wheezing.   Cardiovascular:  Negative for chest pain.  Gastrointestinal:  Negative for abdominal pain.  Musculoskeletal:  Positive for myalgias.  Neurological:  Negative for dizziness.  Psychiatric/Behavioral:  Negative for hallucinations. The patient has insomnia.   Blood pressure 133/74, pulse 94, temperature 98.2 F (36.8 C), temperature source Oral, resp. rate 18, height 5\' 2"  (1.575 m), weight 72.6 kg, SpO2 97 %. Body mass index is 29.26 kg/m.  Treatment Plan Summary: Medication management Hx of MDD dx At this time patient appears to be psychiatrically stable. Patient made concerning comments however there is no intent behind them and patient's husband also confirms that he is not concerned about the statements. Patient may be overwhelmed with her current hospitalization, but she was also doing well prior to her fall and did not indicate that her medication was failing. Further pain mgmt may help patient.  - Continue Paxil 20mg  QHS - Pain mgmt per primary team   Patient is determined to be psychiatrically stable at this time. Psychiatry will sign off. Please do not hesitate to call back if questions arise. Thank you for this consult.     Disposition: No evidence of imminent risk to self or others at present.     PGY-2 Freida Busman, MD 08/15/2020 10:36 AM

## 2020-08-16 ENCOUNTER — Encounter (HOSPITAL_COMMUNITY): Payer: Self-pay | Admitting: Orthopedic Surgery

## 2020-08-16 LAB — BASIC METABOLIC PANEL
Anion gap: 6 (ref 5–15)
BUN: 12 mg/dL (ref 8–23)
CO2: 28 mmol/L (ref 22–32)
Calcium: 8.5 mg/dL — ABNORMAL LOW (ref 8.9–10.3)
Chloride: 99 mmol/L (ref 98–111)
Creatinine, Ser: 0.73 mg/dL (ref 0.44–1.00)
GFR, Estimated: >60 mL/min (ref >60)
Glucose, Bld: 123 mg/dL — ABNORMAL HIGH (ref 70–99)
Potassium: 3.5 mmol/L (ref 3.5–5.1)
Sodium: 133 mmol/L — ABNORMAL LOW (ref 135–145)

## 2020-08-16 MED ORDER — POLYETHYLENE GLYCOL 3350 17 G PO PACK
17.0000 g | PACK | Freq: Every day | ORAL | Status: DC
  Administered 2020-08-16 – 2020-08-21 (×4): 17 g via ORAL
  Filled 2020-08-16 (×5): qty 1

## 2020-08-16 MED ORDER — BISACODYL 10 MG RE SUPP
10.0000 mg | Freq: Once | RECTAL | Status: AC
  Administered 2020-08-16: 10 mg via RECTAL
  Filled 2020-08-16: qty 1

## 2020-08-16 MED ORDER — METHOCARBAMOL 500 MG PO TABS
1000.0000 mg | ORAL_TABLET | Freq: Four times a day (QID) | ORAL | Status: DC
  Administered 2020-08-16 – 2020-08-22 (×21): 1000 mg via ORAL
  Filled 2020-08-16 (×22): qty 2

## 2020-08-16 MED ORDER — TRAMADOL HCL 50 MG PO TABS
50.0000 mg | ORAL_TABLET | Freq: Four times a day (QID) | ORAL | Status: DC
  Administered 2020-08-16 – 2020-08-22 (×23): 50 mg via ORAL
  Filled 2020-08-16 (×23): qty 1

## 2020-08-16 MED ORDER — HYDROMORPHONE HCL 1 MG/ML IJ SOLN
0.5000 mg | INTRAMUSCULAR | Status: DC | PRN
Start: 2020-08-16 — End: 2020-08-17
  Administered 2020-08-16 – 2020-08-17 (×4): 0.5 mg via INTRAVENOUS
  Filled 2020-08-16 (×4): qty 0.5

## 2020-08-16 NOTE — Progress Notes (Signed)
Inpatient Rehab Admissions Coordinator:   CIR consult received. Pt. In severe pain, crying out throughout discussion with her and her husband. At this point in time, Pt. Is very pain limited, requiring max+2 assist for transfers and bed mobility. I do not believe she could tolerate CIR at this point; however, I will give her a few more days and re-consider if her pain is better managed and she is better able to participate with therapies.   Clemens Catholic, [place], [place] Admissions Coordinator  [phone] ([place]) [phone] (office)

## 2020-08-16 NOTE — Progress Notes (Deleted)
[facility] hospitalitis, pt was not able to be discharge due to caregiver not been able for pt once she got home. Notify charge nurse.

## 2020-08-16 NOTE — Progress Notes (Signed)
   08/16/20 1104  Clinical Encounter Type  Visited With Patient not available  Visit Type Initial  Referral From Nurse;Physician  Consult/Referral To Chaplain   Chaplain responded to consult request for HCPOA. Upon arrival, Pt was sleeping. Chaplain will follow-up at a later time.   This note was prepared by Chaplain Resident, Dante Gang, MDiv. Chaplain remains available as needed through the on-call pager: [phone].

## 2020-08-16 NOTE — Care Management Important Message (Signed)
Important Message  Patient Details  Name: [name] MRN: [id number] Date of Birth: 1950-06-17   Medicare Important Message Given:  Yes     Song Myre P Dustie Brittle 08/16/2020, 3:54 PM

## 2020-08-16 NOTE — Progress Notes (Signed)
Progress Note  4 Days Post-Op  Subjective: Patient reports pain in back this AM. Husband upset upon my return back to the room that she doesn't have automatic trays set up like he requested yesterday.  No BM since last Monday.  Patient having difficulty using her IS.  Keeps blowing into it despite multiple instructions  Objective: Vital signs in last 24 hours: Temp:  [97.7 F (36.5 C)-99 F (37.2 C)] 98.4 F (36.9 C) (07/05 0506) Pulse Rate:  [80-86] 86 (07/05 0506) Resp:  [17-18] 18 (07/05 0506) BP: (105-139)/(67-85) 139/85 (07/05 0506) SpO2:  [90 %-97 %] 91 % (07/05 0506) Last BM Date: 08/09/20 (PTA)  Intake/Output from previous day: 07/04 0701 - 07/05 0700 In: 967.4 [P.O.:120; IV Piggyback:847.4] Out: -  Intake/Output this shift: No intake/output data recorded.  PE: General: laying in bed in NAD HEENT: ecchymosis to nose and bl orbits Heart: regular Lungs: CTAB, but poor inspiration.  At best pulls 500 on IS when able to do it. Abd: soft, NT, ND, +BS, no masses, hernias, or organomegaly MS: RUE mildly edematous with ecchymosis, R hand NVI; RLE in KI, R foot NVI Psych: alert and oriented   Lab Results:  Recent Labs    08/14/20 0111 08/15/20 0053  WBC 8.5 12.2*  HGB 9.3* 8.8*  HCT 29.5* 27.8*  PLT 193 142*   BMET Recent Labs    08/15/20 0053 08/16/20 0228  NA 134* 133*  K 4.2 3.5  CL 102 99  CO2 27 28  GLUCOSE 113* 123*  BUN 14 12  CREATININE 0.82 0.73  CALCIUM 8.4* 8.5*   PT/INR No results for input(s): LABPROT, INR in the last 72 hours. CMP     Component Value Date/Time   NA 133 (L) 08/16/2020 0228   K 3.5 08/16/2020 0228   CL 99 08/16/2020 0228   CO2 28 08/16/2020 0228   GLUCOSE 123 (H) 08/16/2020 0228   BUN 12 08/16/2020 0228   CREATININE 0.73 08/16/2020 0228   CREATININE 0.90 04/22/2020 1347   CALCIUM 8.5 (L) 08/16/2020 0228   PROT 6.5 08/09/2020 2140   ALBUMIN 3.6 08/09/2020 2140   AST 27 08/09/2020 2140   ALT 20 08/09/2020 2140    ALKPHOS 85 08/09/2020 2140   BILITOT 0.9 08/09/2020 2140   GFRNONAA >60 08/16/2020 0228   GFRNONAA 65 04/22/2020 1347   GFRAA 75 04/22/2020 1347   Lipase     Component Value Date/Time   LIPASE 37 01/06/2016 1441       Studies/Results: No results found.  Anti-infectives: Anti-infectives (From admission, onward)    Start     Dose/Rate Route Frequency Ordered Stop   08/13/20 0000  ceFAZolin (ANCEF) IVPB 1 g/50 mL premix  Status:  Discontinued        1 g 100 mL/hr over 30 Minutes Intravenous Every 6 hours 08/12/20 2306 08/12/20 2310   08/12/20 1302  vancomycin (VANCOCIN) powder  Status:  Discontinued          As needed 08/12/20 1303 08/12/20 1537   08/12/20 1200  ceFAZolin (ANCEF) IVPB 2g/100 mL premix        2 g 200 mL/hr over 30 Minutes Intravenous On call to O.R. 08/12/20 1151 08/12/20 1400        Assessment/Plan GLF Comminuted R humerus fx - s/p reverse shoulder arthroplasty 7/1, per Dr. Stann Mainland, WBAT R tibial plateau fx and fibula fx - WBAT in [place] per ortho B/l nasal bone frx - f/u as outpatient with ENT ABL  anemia - hgb 8.8, stable Chronic nausea - home phenergan suppositories reordered Suicidal comments - no further, cleared by psych   FEN - reg diet, SLIV, add stool softener and suppository to help with BM DVT - lovenox  ID - no current abx indicated   Dispo - discussed SNF placement with patient who adamantly refused.  Husband came in and also refused initially.  Further conversation had about lack of therapies on a daily basis with [facility] etc.  He has asked if CIR will see her for evaluation.  That order has been placed.  I stressed to the patient and her husband that she HAS to participate and show a desire to get better in order to think that CIR would be a good venue for her due to the intensity of their therapy.  LOS: 6 days    Henreitta Cea, [facility] 08/16/2020, 10:46 AM Please see Amion for pager number during day hours  7:00am-4:30pm

## 2020-08-17 LAB — CBC
HCT: 29.2 % — ABNORMAL LOW (ref 36.0–46.0)
Hemoglobin: 9.2 g/dL — ABNORMAL LOW (ref 12.0–15.0)
MCH: 29.9 pg (ref 26.0–34.0)
MCHC: 31.5 g/dL (ref 30.0–36.0)
MCV: 94.8 fL (ref 80.0–100.0)
Platelets: 233 10*3/uL (ref 150–400)
RBC: 3.08 MIL/uL — ABNORMAL LOW (ref 3.87–5.11)
RDW: 13.5 % (ref 11.5–15.5)
WBC: 10.6 10*3/uL — ABNORMAL HIGH (ref 4.0–10.5)
nRBC: 0 % (ref 0.0–0.2)

## 2020-08-17 MED ORDER — OXYCODONE HCL 5 MG PO TABS
10.0000 mg | ORAL_TABLET | ORAL | Status: DC | PRN
  Administered 2020-08-17 – 2020-08-19 (×6): 15 mg via ORAL
  Administered 2020-08-20: 10 mg via ORAL
  Filled 2020-08-17 (×4): qty 3
  Filled 2020-08-17: qty 2
  Filled 2020-08-17 (×3): qty 3

## 2020-08-17 MED ORDER — LORAZEPAM 2 MG/ML IJ SOLN
0.5000 mg | Freq: Four times a day (QID) | INTRAMUSCULAR | Status: DC | PRN
  Administered 2020-08-17: 0.5 mg via INTRAVENOUS
  Filled 2020-08-17: qty 1

## 2020-08-17 MED ORDER — LORAZEPAM 0.5 MG PO TABS: 0.5000 mg | ORAL_TABLET | Freq: Four times a day (QID) | ORAL | Status: DC | PRN

## 2020-08-17 MED ORDER — HYDROMORPHONE HCL 1 MG/ML IJ SOLN
0.5000 mg | Freq: Four times a day (QID) | INTRAMUSCULAR | Status: DC | PRN
Start: 2020-08-17 — End: 2020-08-22
  Administered 2020-08-17 – 2020-08-19 (×4): 0.5 mg via INTRAVENOUS
  Filled 2020-08-17 (×5): qty 0.5

## 2020-08-17 NOTE — Progress Notes (Signed)
..  Trauma Response Nurse Note-  Reason for Call:  Per pt's nurse- after receiving Ativan .05mg  for anxiety, pt has been wiggling, trying to get out of chair-- assisted back into bed. Has also been hallucinating-- family states that she was asking about the "white bird in the room, her sister and her parents" . Husband and another visitor at bedside.    -  Initial Focused Assessment  On my arrival to room, pt is rolling in bed, moaning, talking nonsensical, unintelligible words. Family states that she has been increasingly like this for the past few days, they do not think it is from the Ativan. I explained that she would not be getting that again. Order has been discontinued.  -  Interventions:  -D/C Ativan per Verbal order Saverio Danker, PA   Plan of Care as of this note: Continue to observe- make TRN and Trauma Surgeon aware of any changes in VS or increasing confusion. Do not give any more Ativan.   -  Event Summary:  I spoke with Saverio Danker, PA prior to arriving to floor to make her aware of pt's confusion. She will foloow up with Dr. Rosendo Gros who is on trauma call today.   Rolene Arbour, RN Trauma Response Nurse

## 2020-08-17 NOTE — Progress Notes (Signed)
Progress Note  5 Days Post-Op  Subjective: Patient reports excessive nausea this morning after eating.  She is yelling and so irritated with this that she can not be redirected to answer any other questions.  Objective: Vital signs in last 24 hours: Temp:  [97.9 F (36.6 C)-98.4 F (36.9 C)] 97.9 F (36.6 C) (07/06 0448) Pulse Rate:  [75-81] 81 (07/06 0448) Resp:  [16-17] 17 (07/06 0448) BP: (139-156)/(71-80) 156/80 (07/06 0448) SpO2:  [93 %-98 %] 98 % (07/06 0448) Last BM Date: 08/08/20  Intake/Output from previous day: 07/05 0701 - 07/06 0700 In: 650 [P.O.:600; IV Piggyback:50] Out: 1100 [Urine:1100] Intake/Output this shift: Total I/O In: 120 [P.O.:120] Out: -   PE: General: laying in bed dry heaving HEENT: wash cloth over face Heart: regular Lungs: CTAB, but poor inspiration.  Abd: soft, NT, ND, +BS, no masses, hernias, or organomegaly MS: RUE mildly edematous with ecchymosis, R hand NVI; RLE out of brace today, R foot NVI Psych: alert and oriented   Lab Results:  Recent Labs    08/15/20 0053 08/17/20 0109  WBC 12.2* 10.6*  HGB 8.8* 9.2*  HCT 27.8* 29.2*  PLT 142* 233   BMET Recent Labs    08/15/20 0053 08/16/20 0228  NA 134* 133*  K 4.2 3.5  CL 102 99  CO2 27 28  GLUCOSE 113* 123*  BUN 14 12  CREATININE 0.82 0.73  CALCIUM 8.4* 8.5*   PT/INR No results for input(s): LABPROT, INR in the last 72 hours. CMP     Component Value Date/Time   NA 133 (L) 08/16/2020 0228   K 3.5 08/16/2020 0228   CL 99 08/16/2020 0228   CO2 28 08/16/2020 0228   GLUCOSE 123 (H) 08/16/2020 0228   BUN 12 08/16/2020 0228   CREATININE 0.73 08/16/2020 0228   CREATININE 0.90 04/22/2020 1347   CALCIUM 8.5 (L) 08/16/2020 0228   PROT 6.5 08/09/2020 2140   ALBUMIN 3.6 08/09/2020 2140   AST 27 08/09/2020 2140   ALT 20 08/09/2020 2140   ALKPHOS 85 08/09/2020 2140   BILITOT 0.9 08/09/2020 2140   GFRNONAA >60 08/16/2020 0228   GFRNONAA 65 04/22/2020 1347   GFRAA 75  04/22/2020 1347   Lipase     Component Value Date/Time   LIPASE 37 01/06/2016 1441       Studies/Results: No results found.  Anti-infectives: Anti-infectives (From admission, onward)    Start     Dose/Rate Route Frequency Ordered Stop   08/13/20 0000  ceFAZolin (ANCEF) IVPB 1 g/50 mL premix  Status:  Discontinued        1 g 100 mL/hr over 30 Minutes Intravenous Every 6 hours 08/12/20 2306 08/12/20 2310   08/12/20 1302  vancomycin (VANCOCIN) powder  Status:  Discontinued          As needed 08/12/20 1303 08/12/20 1537   08/12/20 1200  ceFAZolin (ANCEF) IVPB 2g/100 mL premix        2 g 200 mL/hr over 30 Minutes Intravenous On call to O.R. 08/12/20 1151 08/12/20 1400        Assessment/Plan GLF Comminuted R humerus fx - s/p reverse shoulder arthroplasty 7/1, per Dr. Stann Mainland, WBAT R tibial plateau fx and fibula fx - WBAT in [place] per ortho B/l nasal bone frx - f/u as outpatient with ENT ABL anemia - hgb 8.8, stable Chronic nausea - home phenergan suppositories reordered Suicidal comments - no further, cleared by psych   FEN - reg diet, SLIV, add stool  softener and suppository to help with BM DVT - lovenox  ID - no current abx indicated   Dispo - likely needs SNF, but husband and patient do not seem interested in this and want to know why she can't stay in the hospital long enough to get completely better.  LOS: 7 days    Henreitta Cea, [facility] 08/17/2020, 11:25 AM Please see Amion for pager number during day hours 7:00am-4:30pm

## 2020-08-17 NOTE — TOC Initial Note (Signed)
Transition of Care [facility]) - Initial/Assessment Note    Patient Details  Name: LEXIS POTENZA MRN: [id number] Date of Birth: 1950-03-23  Transition of Care [facility]) CM/SW Contact:    Ella Bodo, RN Phone Number: 08/17/2020, 3:49 PM  Clinical Narrative:  70 y.o. female who was transferred to [facility] 6/28 after mechanical GLF; landed on her right side, hit her head, no LOC. Now pt with Right tibia plateau fx , Right shoulder fx , and B nasal bone fractures.               Prior to admission, patient independent and living at home with her husband.  Patient currently experiencing episodes of confusion; left message for patient's spouse to discuss discharge planning.  SNF is recommended currently for rehab; CIR is following at a distance.  Will continue to follow as patient progresses.  Expected Discharge Plan: IP Rehab Facility Barriers to Discharge: Continued Medical Work up        Expected Discharge Plan and Services Expected Discharge Plan: [place]   Discharge Planning Services: CM Consult   Living arrangements for the past 2 months: Single Family Home                                      Prior Living Arrangements/Services Living arrangements for the past 2 months: Single Family Home Lives with:: Spouse Patient language and need for interpreter reviewed:: Yes Do you feel safe going back to the place where you live?: Yes      Need for Family Participation in Patient Care: Yes (Comment) Care giver support system in place?: Yes (comment)   Criminal Activity/Legal Involvement Pertinent to Current Situation/Hospitalization: No - Comment as needed  Activities of Daily Living Home Assistive Devices/Equipment: Blood pressure cuff, Eyeglasses ADL Screening (condition at time of admission) Patient's cognitive ability adequate to safely complete daily activities?: Yes Is the patient deaf or have difficulty hearing?: No Does the patient have difficulty seeing, even when  wearing glasses/contacts?: No Does the patient have difficulty concentrating, remembering, or making decisions?: No Patient able to express need for assistance with ADLs?: Yes Does the patient have difficulty dressing or bathing?: No Independently performs ADLs?: Yes (appropriate for developmental age) Does the patient have difficulty walking or climbing stairs?: No Weakness of Legs: None Weakness of Arms/Hands: None                 Emotional Assessment Appearance:: Appears stated age   Affect (typically observed): Agitated Orientation: : Oriented to Self      Admission diagnosis:  Shoulder fracture [S42.90XA] Fall [W19.XXXA] Hypoxia [R09.02] Closed fracture of nasal bone, initial encounter [S02.2XXA] Laceration of nose, initial encounter [S01.21XA] Closed fracture of right tibial plateau, initial encounter [S82.141A] Closed fracture of right shoulder, initial encounter [S42.91XA] Patient Active Problem List   Diagnosis Date Noted   Shoulder fracture 08/10/2020   Fall 08/10/2020   Lumbar compression fracture ([place]) 05/11/2019   Educated about COVID-19 virus infection 01/26/2019   Dyslipidemia 01/26/2019   Drug-induced myopathy 01/14/2019   Haglund's deformity 01/12/2019   Myalgia 12/30/2018   High risk medication use 12/04/2018   Recurrent major depressive disorder, in full remission ([place]) 09/03/2018   Lower esophageal ring ([place]) 03/03/2018   Osteoarthritis of right hip 12/02/2017   GERD (gastroesophageal reflux disease) 12/15/2015   Polyp of colon, adenomatous 10/11/2014   Vitamin D deficiency 05/26/2014   Osteoporosis  05/26/2014   Polymyalgia rheumatica ([place]) 12/01/2010   CAD (coronary artery disease) 09/09/2007   Hematuria 09/09/2007   Depression 03/20/2007   Hypothyroid 02/13/1999   PCP:  [name], MD Pharmacy:   [place], [place] - [address] [place] [address] Phone: [phone] Fax:  [phone]  [facility] [place], [place] [address] Phone: [phone] Fax: [phone]  [place], [address] [place] [place] [address] [place] [address] Phone: [phone] Fax: [phone]     Social Determinants of Health (SDOH) Interventions    Readmission Risk Interventions No flowsheet data found.  Reinaldo Raddle, RN, BSN  Trauma/Neuro ICU Case Manager [phone]

## 2020-08-17 NOTE — Progress Notes (Signed)
Concerned about pt, called Trauma response Nurse, ? UTI or delirium, pt yelling out and extremely confused.

## 2020-08-17 NOTE — Progress Notes (Signed)
Gave pt 0.5 ativan for anxiety. Pt is still screaming in pain in the chair. Pt is confused and hallucinated, see people and per pt "they are placing licorice on her ankle, causing her pain". Page PA and trauma nurse.   Ativan has been d/c. Pt place back to chair about 1330 and pt received schedule robaxin, and PRN 15mg  oxycodone. Pt is still screaming in pain and still confuse. Will continue to monitor.

## 2020-08-17 NOTE — Progress Notes (Signed)
Physical Therapy Treatment Patient Details Name: [name] MRN: [id number] DOB: January 10, 1951 Today's Date: 08/17/2020    History of Present Illness 70 y.o. female who was transferred to [facility] 6/28 after mechanical GLF; landed on her right side, hit her head, no LOC. Now pt with Right tibia plateau fx (non-operative management with KI, WBAT), Right shoulder fx (Reverse shoulder arthroplasty 7/01, okay for WB on RW, no AROM), and B nasal bone fractures. PMH: Chronic low back pain with history of L1 compression fracture, MI 2009, osteoporosis, CAD, hypothyroidism, hyperlipidemia, polymyalgia rheumatica    PT Comments    Pt seated on commode on arrival after having BM.  Pt internally distracted by pain on arrival.  She performed transfer into standing for hygiene but unable to follow commands to stand upright.  Pt lacks ability to progress to gt at this time.  Repositioned brace post session as it appeared to be low while on commode.  Educated patient and her spouse of correct application and placement.  Attempted education on incentive spirometer but she was unable to grasp how to pull air in vs. Push air out.  Will continue to recommend snf placement at this time.    Follow Up Recommendations  SNF;Supervision/Assistance - 24 hour     Equipment Recommendations  Rolling walker with 5" wheels;3in1 (PT);Wheelchair (measurements PT);Wheelchair cushion (measurements PT);Hospital bed (mechanical lift)    Recommendations for Other Services       Precautions / Restrictions Precautions Precautions: Shoulder;Fall Type of Shoulder Precautions: Reverse shoulder Shoulder Interventions: Shoulder sling/immobilizer;At all times;Off for dressing/bathing/exercises Precaution Comments: "Sling At all times except ADL / exercise. Non weight bearing. AROM elbow, wrist and hand to tolerance. OK for pendulums. Forward Flexion 0-90. No Abduction. No AROM of shoulder. ok for WBAT with walker or crutch" Required  Braces or Orthoses: Knee Immobilizer - Right;Sling Knee Immobilizer - Right: On at all times Restrictions Weight Bearing Restrictions: Yes RUE Weight Bearing: Non weight bearing RLE Weight Bearing: Weight bearing as tolerated    Mobility  Bed Mobility Overal bed mobility: Needs Assistance             General bed mobility comments: Pt seated on commode on arrival this session.  Moaning and writhing in pain.    Transfers Overall transfer level: Needs assistance Equipment used: Rolling walker (2 wheeled) Transfers: Sit to/from Stand Sit to Stand: Max assist;+2 physical assistance         General transfer comment: Pt with flexed hip and posterior lean this session.  Pt guarded and internally distracted by pain.  Unable to advance steps away from commode.  Performed pericare in partial stand and then moved commode and rolled recliner underneath patient to sit in recliner.  Ambulation/Gait Ambulation/Gait assistance:  (unable to follow commands for progression of gt training.)               Stairs             Wheelchair Mobility    Modified Rankin (Stroke Patients Only)       Balance Overall balance assessment: Needs assistance Sitting-balance support: Single extremity supported;Feet supported Sitting balance-Leahy Scale: Poor Sitting balance - Comments: Pt with posterior pevic tilt in sitting.  Posterior lean with hips flexed in standing. Postural control: Posterior lean   Standing balance-Leahy Scale: Zero Standing balance comment: heavy external assistance to maintain standing.  Cognition Arousal/Alertness: Awake/alert Behavior During Therapy: Anxious Overall Cognitive Status: Impaired/Different from baseline Area of Impairment: Following commands;Problem solving;Safety/judgement                       Following Commands: Follows one step commands inconsistently Safety/Judgement: Decreased awareness of  safety;Decreased awareness of deficits   Problem Solving: Slow processing;Decreased initiation;Difficulty sequencing;Requires verbal cues;Requires tactile cues General Comments: Pt very internally distracted by pain an anxiety.  Pt crying in pain on commode on arrival.  Required repeated cueing for safety this session.      Exercises General Exercises - Lower Extremity Ankle Circles/Pumps: AROM;Both;10 reps;Supine Quad Sets: AROM;Left;5 reps;Supine Heel Slides: AROM;Left;5 reps;Supine Other Exercises Other Exercises: L shoulder flex/ext x 10 reps AAROM, L elbow flex.ext x 10 reps.    General Comments        Pertinent Vitals/Pain Pain Assessment: Faces Faces Pain Scale: Hurts worst Pain Location: R LE with movement, hip, back Pain Descriptors / Indicators: Discomfort;Grimacing;Guarding;Crying;Moaning Pain Intervention(s): Monitored during session;Repositioned    Home Living                      Prior Function            PT Goals (current goals can now be found in the care plan section) Acute Rehab PT Goals Patient Stated Goal: less pain Potential to Achieve Goals: Fair Progress towards PT goals: Progressing toward goals    Frequency    Min 3X/week      PT Plan Current plan remains appropriate    Co-evaluation              AM-PAC PT "6 Clicks" Mobility   Outcome Measure  Help needed turning from your back to your side while in a flat bed without using bedrails?: A Lot Help needed moving from lying on your back to sitting on the side of a flat bed without using bedrails?: A Lot Help needed moving to and from a bed to a chair (including a wheelchair)?: Total Help needed standing up from a chair using your arms (e.g., wheelchair or bedside chair)?: A Lot Help needed to walk in hospital room?: Total Help needed climbing 3-5 steps with a railing? : Total 6 Click Score: 9    End of Session Equipment Utilized During Treatment: Right knee  immobilizer;Gait belt;Other (comment) (R UE sling) Activity Tolerance: Treatment limited secondary to agitation (high levels of pain and anxiety.)   Nurse Communication: Mobility status;Patient requests pain meds;Other (comment);Need for lift equipment PT Visit Diagnosis: Unsteadiness on feet (R26.81);Muscle weakness (generalized) (M62.81);History of falling (Z91.81);Difficulty in walking, not elsewhere classified (R26.2);Pain Pain - Right/Left: Right Pain - part of body: Shoulder;Leg     Time: 2426-8341 PT Time Calculation (min) (ACUTE ONLY): 31 min  Charges:  $Therapeutic Exercise: 8-22 mins $Therapeutic Activity: 8-22 mins                     [name] , PTA Acute Rehabilitation Services Pager [phone] Office [phone]    Cylas Falzone Eli Hose 08/17/2020, 12:28 PM

## 2020-08-18 LAB — BASIC METABOLIC PANEL
Anion gap: 8 (ref 5–15)
BUN: 9 mg/dL (ref 8–23)
CO2: 27 mmol/L (ref 22–32)
Calcium: 8.3 mg/dL — ABNORMAL LOW (ref 8.9–10.3)
Chloride: 102 mmol/L (ref 98–111)
Creatinine, Ser: 0.68 mg/dL (ref 0.44–1.00)
GFR, Estimated: >60 mL/min (ref >60)
Glucose, Bld: 85 mg/dL (ref 70–99)
Potassium: 3.4 mmol/L — ABNORMAL LOW (ref 3.5–5.1)
Sodium: 137 mmol/L (ref 135–145)

## 2020-08-18 LAB — CBC
HCT: 28.5 % — ABNORMAL LOW (ref 36.0–46.0)
Hemoglobin: 9.1 g/dL — ABNORMAL LOW (ref 12.0–15.0)
MCH: 30.1 pg (ref 26.0–34.0)
MCHC: 31.9 g/dL (ref 30.0–36.0)
MCV: 94.4 fL (ref 80.0–100.0)
Platelets: 273 10*3/uL (ref 150–400)
RBC: 3.02 MIL/uL — ABNORMAL LOW (ref 3.87–5.11)
RDW: 13.8 % (ref 11.5–15.5)
WBC: 7.9 10*3/uL (ref 4.0–10.5)
nRBC: 0 % (ref 0.0–0.2)

## 2020-08-18 MED ORDER — KETOROLAC TROMETHAMINE 15 MG/ML IJ SOLN
15.0000 mg | Freq: Four times a day (QID) | INTRAMUSCULAR | Status: DC
  Administered 2020-08-18 – 2020-08-22 (×14): 15 mg via INTRAVENOUS
  Filled 2020-08-18 (×15): qty 1

## 2020-08-18 NOTE — TOC Progression Note (Signed)
Transition of Care [facility]) - Progression Note    Patient Details  Name: [name] MRN: [id number] Date of Birth: Mar 10, 1950  Transition of Care [facility]) CM/SW Contact  Ella Bodo, RN Phone Number: 08/18/2020, 3:00pm Clinical Narrative:   Attempted to speak with patient regarding SNF placement; patient screaming,states "I can't live like this another minute!" She is moaning, and writhing in bed, stating that she is in pain and nauseated.  Notified patient's bedside nurse of patient's discomfort.  Attempted to reach pt's husband, Hadlee Burback, by phone; left voicemail message.      Expected Discharge Plan: IP Rehab Facility Barriers to Discharge: Continued Medical Work up  Expected Discharge Plan and Services Expected Discharge Plan: [place]   Discharge Planning Services: CM Consult   Living arrangements for the past 2 months: Single Family Home                                       Social Determinants of Health (SDOH) Interventions    Readmission Risk Interventions No flowsheet data found.  Reinaldo Raddle, RN, BSN  Trauma/Neuro ICU Case Manager [phone]

## 2020-08-18 NOTE — Progress Notes (Signed)
Inpatient Rehab Admissions Coordinator:   Pt continues to demonstrate poor tolerance for mobility.  Therapy recommending SNF, which is appropriate.  CIR will sign off at this time.   Shann Medal, PT, DPT Admissions Coordinator [phone] 08/18/20  11:55 AM

## 2020-08-18 NOTE — Progress Notes (Signed)
   Trauma/Critical Care Follow Up Note  Subjective:    Overnight Issues:   Objective:  Vital signs for last 24 hours: Temp:  [98.4 F (36.9 C)-99.2 F (37.3 C)] 98.4 F (36.9 C) (07/07 0445) Pulse Rate:  [80-92] 82 (07/07 0445) Resp:  [16-20] 20 (07/07 0445) BP: (109-155)/(78-87) 155/81 (07/07 0445) SpO2:  [94 %-97 %] 95 % (07/07 0445)  Hemodynamic parameters for last 24 hours:    Intake/Output from previous day: 07/06 0701 - 07/07 0700 In: 750 [P.O.:750] Out: 900 [Urine:900]  Intake/Output this shift: No intake/output data recorded.  Vent settings for last 24 hours:    Physical Exam:  Gen: comfortable, no distress, but sleepy Neuro: non-focal exam, f/c HEENT: PERRL Neck: supple CV: RRR Pulm: unlabored breathing Abd: soft, NT GU: clear yellow urine Extr: wwp, no edema   Results for orders placed or performed during the hospital encounter of 08/09/20 (from the past 24 hour(s))  CBC     Status: Abnormal   Collection Time: 08/18/20  6:52 AM  Result Value Ref Range   WBC 7.9 4.0 - 10.5 K/uL   RBC 3.02 (L) 3.87 - 5.11 MIL/uL   Hemoglobin 9.1 (L) 12.0 - 15.0 g/dL   HCT 28.5 (L) 36.0 - 46.0 %   MCV 94.4 80.0 - 100.0 fL   MCH 30.1 26.0 - 34.0 pg   MCHC 31.9 30.0 - 36.0 g/dL   RDW 13.8 11.5 - 15.5 %   Platelets 273 150 - 400 K/uL   nRBC 0.0 0.0 - 0.2 %  Basic metabolic panel     Status: Abnormal   Collection Time: 08/18/20  6:52 AM  Result Value Ref Range   Sodium 137 135 - 145 mmol/L   Potassium 3.4 (L) 3.5 - 5.1 mmol/L   Chloride 102 98 - 111 mmol/L   CO2 27 22 - 32 mmol/L   Glucose, Bld 85 70 - 99 mg/dL   BUN 9 8 - 23 mg/dL   Creatinine, Ser 0.68 0.44 - 1.00 mg/dL   Calcium 8.3 (L) 8.9 - 10.3 mg/dL   GFR, Estimated >60 >60 mL/min   Anion gap 8 5 - 15    Assessment & Plan:   Present on Admission:  Shoulder fracture  Fall    LOS: 8 days   Additional comments:I reviewed the patient's new clinical lab test results.   and I reviewed the patients  new imaging test results.    GLF Comminuted R humerus fx - s/p reverse shoulder arthroplasty 7/1, per Dr. Stann Mainland, WBAT R tibial plateau fx and fibula fx - WBAT in KI per ortho B/l nasal bone frx - f/u as outpatient with ENT ABL anemia - hgb 8.8, stable Chronic nausea - home phenergan suppositories reordered Suicidal comments - no further, cleared by psych   FEN - reg diet, SLIV, add stool softener and suppository to help with BM DVT - lovenox  ID - no current abx indicated   Dispo - SNF, patient agreeable this AM  Jesusita Oka, MD Trauma & General Surgery Please use [email or web address] to contact on call provider  08/18/2020  *Care during the described time interval was provided by me. I have reviewed this patient's available data, including medical history, events of note, physical examination and test results as part of my evaluation.

## 2020-08-18 NOTE — Progress Notes (Signed)
Occupational Therapy Treatment Patient Details Name: [name] MRN: [id number] DOB: 01/23/1951 Today's Date: 08/18/2020    History of present illness 70 y.o. female who was transferred to [facility] 6/28 after mechanical GLF; landed on her right side, hit her head, no LOC. Now pt with Right tibia plateau fx (non-operative management with KI, WBAT), Right shoulder fx (Reverse shoulder arthroplasty 7/01, okay for WB on RW, no AROM), and B nasal bone fractures. PMH: Chronic low back pain with history of L1 compression fracture, MI 2009, osteoporosis, CAD, hypothyroidism, hyperlipidemia, polymyalgia rheumatica   OT comments  Patient with incremental progress toward patient focused OT goals.  Patient very sleepy this date, difficulty keeping eyes awake.  Complains of R knee pain, nausea, and KI.  KI was off upon entering the room, and patient lying supine in bed.  OT focused on A/AA/PROM to R arm according to MD orders.  Patient with max A to reposition in bed, sling adjusted for fit and comfort, and R arm elevated with pillow for edema management.  SNF is recommended, patient would need 24 hour heavy assist, and DME listed to return home.  OT will continue to follow in the acute setting to maximize her functional status.    Follow Up Recommendations  SNF;Supervision/Assistance - 24 hour;Follow surgeon's recommendation for DC plan and follow-up therapies    Equipment Recommendations  3 in 1 bedside commode;Other (comment);Wheelchair (measurements OT);Wheelchair cushion (measurements OT);Hospital bed;Tub/shower seat    Recommendations for Other Services      Precautions / Restrictions Precautions Precautions: Shoulder;Fall Type of Shoulder Precautions: Reverse shoulder Shoulder Interventions: Shoulder sling/immobilizer;At all times;Off for dressing/bathing/exercises Precaution Comments: "Sling At all times except ADL / exercise. Non weight bearing. AROM elbow, wrist and hand to tolerance. OK for  pendulums. Forward Flexion 0-90. No Abduction. No AROM of shoulder. ok for WBAT with walker or crutch" Required Braces or Orthoses: Knee Immobilizer - Right Knee Immobilizer - Right: On at all times Restrictions Weight Bearing Restrictions: Yes RUE Weight Bearing: Weight bearing as tolerated RLE Weight Bearing: Weight bearing as tolerated Other Position/Activity Restrictions: RUE WBAT for RW       Mobility Bed Mobility               General bed mobility comments: Max A for scooting higher in the bed for positioning. Patient Response: Restless;Anxious  Transfers                                                                                      Cognition Arousal/Alertness: Awake/alert Behavior During Therapy: Agitated;Anxious Overall Cognitive Status: Impaired/Different from baseline                         Following Commands: Follows one step commands inconsistently Safety/Judgement: Decreased awareness of safety;Decreased awareness of deficits   Problem Solving: Slow processing;Decreased initiation;Difficulty sequencing;Requires verbal cues;Requires tactile cues General Comments: patient keeping eyes closed for the majority of the session, irritable, and complaining of discharge plan and nausea.        Exercises Other Exercises Other Exercises: supine RUE: wrist flex/ext, finger flex/ext x10 reps and 2 set Other Exercises: supine RUE:  elbow flex/ext,  x10 reps and 2 sets Other Exercises: supine RUE: forearm pro/sup  x10 reps and 2 sets Other Exercises: supine RUE: pendulum PROM clockwise and counter clockwise 1 set and 10 reps. Other Exercises: PROM supine R shoulder 0-30 PROM FF   Shoulder Instructions       General Comments      Pertinent Vitals/ Pain       Pain Assessment: Faces Faces Pain Scale: Hurts even more Pain Location: R LE with movement, hip, back.  Shoulder hurts to a lesser degree than the  knee Pain Descriptors / Indicators: Discomfort;Grimacing;Guarding;Crying;Moaning Pain Intervention(s): Monitored during session                                                          Frequency  Min 2X/week        Progress Toward Goals  OT Goals(current goals can now be found in the care plan section)  Progress towards OT goals: Progressing toward goals  Acute Rehab OT Goals Patient Stated Goal: I'm not going home until I can stand OT Goal Formulation: With patient Time For Goal Achievement: 08/27/20 Potential to Achieve Goals: [place] Discharge plan needs to be updated    Co-evaluation                 AM-PAC OT "6 Clicks" Daily Activity     Outcome Measure   Help from another person eating meals?: A Little Help from another person taking care of personal grooming?: A Little Help from another person toileting, which includes using toliet, bedpan, or urinal?: Total Help from another person bathing (including washing, rinsing, drying)?: A Lot Help from another person to put on and taking off regular upper body clothing?: A Lot Help from another person to put on and taking off regular lower body clothing?: Total 6 Click Score: 12    End of Session Equipment Utilized During Treatment: Other (comment)  OT Visit Diagnosis: Other abnormalities of gait and mobility (R26.89);Repeated falls (R29.6);Muscle weakness (generalized) (M62.81);History of falling (Z91.81);Pain Pain - Right/Left: Right Pain - part of body: Shoulder;Knee   Activity Tolerance Patient limited by pain   Patient Left in bed;with call bell/phone within reach;with bed alarm set   Nurse Communication          Time: 4431-5400 OT Time Calculation (min): 15 min  Charges: OT General Charges $OT Visit: 1 Visit OT Treatments $Therapeutic Activity: 8-22 mins  08/18/2020  Rich, OTR/L  Acute Rehabilitation Services  Office:  [place] 08/18/2020, 2:48 PM

## 2020-08-19 ENCOUNTER — Telehealth: Payer: Self-pay

## 2020-08-19 LAB — CREATININE, SERUM
Creatinine, Ser: 0.77 mg/dL (ref 0.44–1.00)
GFR, Estimated: >60 mL/min (ref >60)

## 2020-08-19 MED ORDER — PAROXETINE HCL 20 MG PO TABS
20.0000 mg | ORAL_TABLET | Freq: Once | ORAL | Status: AC
  Administered 2020-08-19: 20 mg via ORAL

## 2020-08-19 MED ORDER — HALOPERIDOL LACTATE 5 MG/ML IJ SOLN
2.0000 mg | Freq: Four times a day (QID) | INTRAMUSCULAR | Status: DC | PRN
  Administered 2020-08-19 – 2020-08-21 (×6): 2 mg via INTRAVENOUS
  Filled 2020-08-19 (×6): qty 1

## 2020-08-19 MED ORDER — QUETIAPINE FUMARATE 50 MG PO TABS
25.0000 mg | ORAL_TABLET | Freq: Every day | ORAL | Status: DC
  Administered 2020-08-19: 25 mg via ORAL
  Filled 2020-08-19: qty 1

## 2020-08-19 MED ORDER — HALOPERIDOL LACTATE 5 MG/ML IJ SOLN
5.0000 mg | Freq: Once | INTRAMUSCULAR | Status: AC
  Administered 2020-08-19: 5 mg via INTRAVENOUS
  Filled 2020-08-19: qty 1

## 2020-08-19 MED ORDER — HALOPERIDOL LACTATE 5 MG/ML IJ SOLN
5.0000 mg | Freq: Four times a day (QID) | INTRAMUSCULAR | Status: DC | PRN
  Administered 2020-08-19: 5 mg via INTRAVENOUS
  Filled 2020-08-19 (×2): qty 1

## 2020-08-19 MED ORDER — HALOPERIDOL 1 MG PO TABS
2.5000 mg | ORAL_TABLET | Freq: Three times a day (TID) | ORAL | Status: DC
  Filled 2020-08-19 (×2): qty 1

## 2020-08-19 MED ORDER — HALOPERIDOL LACTATE 5 MG/ML IJ SOLN
5.0000 mg | Freq: Once | INTRAMUSCULAR | Status: AC
  Administered 2020-08-19: 5 mg via INTRAVENOUS

## 2020-08-19 MED ORDER — HALOPERIDOL 5 MG PO TABS
5.0000 mg | ORAL_TABLET | Freq: Four times a day (QID) | ORAL | Status: DC | PRN
  Filled 2020-08-19: qty 1

## 2020-08-19 MED ORDER — PAROXETINE HCL 20 MG PO TABS
20.0000 mg | ORAL_TABLET | Freq: Every day | ORAL | Status: DC
  Administered 2020-08-20 – 2020-08-21 (×2): 20 mg via ORAL
  Filled 2020-08-19 (×2): qty 1

## 2020-08-19 NOTE — Progress Notes (Signed)
OT Cancellation Note  Patient Details Name: [name] MRN: [id number] DOB: 05-14-1950   Cancelled Treatment:    Reason Eval/Treat Not Completed: Patient at procedure or test/ unavailable. Per RN, therapy should be held today due to pt delirium/agitation. OT will follow up as appropriate.  Calli Bashor H., OTR/L Acute Rehabilitation  [name] Elane Yolanda Bonine 08/19/2020, 4:36 PM

## 2020-08-19 NOTE — Progress Notes (Signed)
   Trauma/Critical Care Follow Up Note  Subjective:    Notes from overnight noted and got haldol overnight.  Patient this morning is telling me she wants to mobilize so she can go home.  We discussed how she has stated this for several days and that she has not done yet shown that she can do this.  Pain is a 3 this am.  Nausea is stable  Objective:  Vital signs for last 24 hours: Temp:  [97.5 F (36.4 C)-98.4 F (36.9 C)] 97.5 F (36.4 C) (07/08 0753) Pulse Rate:  [60-90] 60 (07/08 0753) Resp:  [17-19] 17 (07/08 0753) BP: (142-162)/(66-89) 146/66 (07/08 0753) SpO2:  [92 %-100 %] 95 % (07/08 0753)  Hemodynamic parameters for last 24 hours:    Intake/Output from previous day: 07/07 0701 - 07/08 0700 In: 840 [P.O.:840] Out: 600 [Urine:600]  Intake/Output this shift: No intake/output data recorded.  Vent settings for last 24 hours:    Physical Exam:  Gen: comfortable, no distress, but sleepy Neuro: non-focal exam, f/c HEENT: PERRL Neck: supple CV: RRR Pulm: unlabored breathing Abd: soft, NT Ext: splint in place on RUE. MAE otherwise   Results for orders placed or performed during the hospital encounter of 08/09/20 (from the past 24 hour(s))  Creatinine, serum     Status: None   Collection Time: 08/19/20  7:31 AM  Result Value Ref Range   Creatinine, Ser 0.77 0.44 - 1.00 mg/dL   GFR, Estimated >60 >60 mL/min    Assessment & Plan:   Present on Admission:  Shoulder fracture  Fall    LOS: 9 days    GLF Comminuted R humerus fx - s/p reverse shoulder arthroplasty 7/1, per Dr. Stann Mainland, WBAT R tibial plateau fx and fibula fx - WBAT in KI per ortho B/l nasal bone frx - f/u as outpatient with ENT ABL anemia - hgb 8.8, stable Chronic nausea - home phenergan suppositories reordered Suicidal comments - no further, cleared by psych   FEN - reg diet, SLIV, add stool softener and suppository to help with BM DVT - lovenox  ID - no current abx indicated   Dispo -  patient needs SNF, she states this am she wanted to go home.  Husband not at bedside.  Working on [place] [place] Please use [email or web address] to contact on call provider  08/19/2020

## 2020-08-19 NOTE — TOC Progression Note (Signed)
Transition of Care [facility]) - Progression Note    Patient Details  Name: [name] MRN: [id number] Date of Birth: 1950-05-23  Transition of Care [facility]) CM/SW Contact  Ella Bodo, RN Phone Number: 08/19/2020, 4:23 PM  Clinical Narrative: Patient with progressive delirium and hallucinations today, requiring psych to be reconsulted.  Psychiatry feels patient delirious due to her medication regimen, that has worsened with benzodiazepines.  Psych recommending bedside sitter.  Will continue to follow for disposition; feel patient eventually will need SNF placement when psychiatric issue improves or resolves.    Expected Discharge Plan: [place] Barriers to Discharge: Continued Medical Work up  Expected Discharge Plan and Services Expected Discharge Plan: [place]   Discharge Planning Services: CM Consult   Living arrangements for the past 2 months: Single Family Home                                       Social Determinants of Health (SDOH) Interventions    Readmission Risk Interventions No flowsheet data found.  Reinaldo Raddle, RN, BSN  Trauma/Neuro ICU Case Manager [phone]

## 2020-08-19 NOTE — Progress Notes (Signed)
Patient is refusing vital signs.

## 2020-08-19 NOTE — Progress Notes (Signed)
PT Cancellation Note  Patient Details Name: [name] MRN: [id number] DOB: March 20, 1950   Cancelled Treatment:    Reason Eval/Treat Not Completed: Medical issues which prohibited therapy. Patient continues to scream and is not appropriate for PT session this date. Will continue to re-attempt as appropriate.     Jatorian Renault 08/19/2020, 2:50 PM

## 2020-08-19 NOTE — Progress Notes (Signed)
PT Cancellation Note  Patient Details Name: [name] MRN: [id number] DOB: Feb 08, 1951   Cancelled Treatment:    Reason Eval/Treat Not Completed: Medical issues which prohibited therapy. Patient is screaming, RN states it is not a good time to try PT. Will re-attempt later if appropriate.    Navarre Diana 08/19/2020, 11:42 AM

## 2020-08-19 NOTE — Progress Notes (Addendum)
[employer] PA-C, notified of patient's verbalization of going into panic attack. Breathing technique was re-enforced, Awaiting orders.  11:14 Provider paged regarding patient's verbalization of being in panic attack. No order yet. This RN tried to reassured her but failed.  11:58 Haldol ordered, awaiting verification by pharmacist.

## 2020-08-19 NOTE — Consult Note (Signed)
[place] Psychiatry Consult   Reason for Consult:  Agitation Referring Physician:  Saverio Danker, PA Patient Identification: CAMBREY [name] MRN:  [id number] Principal Diagnosis: <principal problem not specified> Diagnosis:  Active Problems:   Shoulder fracture   Fall   Total Time spent with patient: 30 minutes  Subjective:   [name] is a 70 y.o. female patient admitted with  s/p mechanical GLF after tripping. Landed on her right side, hit her head, no LOC. Patient has a PPH of depression. Marland Kitchen  HPI:  On assessment patient is screaming that she "can't breathe" and that she needs "something to knock me out." Patient screams that she is going through something and needs medication. Patient hyperventilates and writhes in bed.   Past Psychiatric History: Dx w/ MDD, taking Paxil for 62 y  Risk to Self:  NO Risk to Others:  NO Prior Inpatient Therapy:  NO Prior Outpatient Therapy:  Paxil prescribed by PCP  Past Medical History:  Past Medical History:  Diagnosis Date   Anxiety    Arthritis    CAD (coronary artery disease)    Cataract    Depression    DISORDER, MENOPAUSAL NOS 03/20/2007   Qualifier: Diagnosis of  By: Arnoldo Morale MD, John E    GERD (gastroesophageal reflux disease)    Hyperlipidemia    Hypothyroidism    MYOCARDIAL INFARCTION, HX OF 09/09/2007   Qualifier: Diagnosis of  By: Leanne Chang MD, Bruce     Osteoporosis    Polymyalgia rheumatica [facility])     Past Surgical History:  Procedure Laterality Date   CATARACT EXTRACTION     CESAREAN SECTION     CHOLECYSTECTOMY N/A 11/24/2012   Procedure: LAPAROSCOPIC CHOLECYSTECTOMY;  Surgeon: Ralene Ok, MD;  Location: [place];  Service: General;  Laterality: N/A;   COSMETIC SURGERY     EYE SURGERY     eye lids lifted   PTCA     REVERSE SHOULDER ARTHROPLASTY Right 08/12/2020   Procedure: REVERSE SHOULDER ARTHROPLASTY;  Surgeon: Nicholes Stairs, MD;  Location: [place];  Service: Orthopedics;  Laterality: Right;    Family History:  Family History  Problem Relation Age of Onset   Hypertension Father    Heart disease Father        Mi age 21, 45, 54 and 51.   Cancer Father    Hyperlipidemia Father    Colon cancer Maternal Uncle    Hypertension Mother    Cancer Mother    Hyperlipidemia Mother    Pulmonary fibrosis Sister    Epilepsy Son    Healthy Son    Myasthenia gravis Son    Healthy Son    Rheum arthritis Niece    Lupus Niece    Rheum arthritis Niece    Family Psychiatric  History: Family Psychiatric  History: Sister had depression and required multiple medications over her lifetime Social History:  Social History   Substance and Sexual Activity  Alcohol Use No   Alcohol/week: 0.0 standard drinks     Social History   Substance and Sexual Activity  Drug Use No    Social History   Socioeconomic History   Marital status: Married    Spouse name: Not on file   Number of children: Not on file   Years of education: Not on file   Highest education level: Not on file  Occupational History   Not on file  Tobacco Use   Smoking status: Never   Smokeless tobacco: Never  Vaping Use  Vaping Use: Never used  Substance and Sexual Activity   Alcohol use: No    Alcohol/week: 0.0 standard drinks   Drug use: No   Sexual activity: Not on file  Other Topics Concern   Not on file  Social History Narrative   Husband and 2 adult children live at home. No grandkids. Son with epilepsy.      Retired at age 70    [place]- at [employer].     Social Determinants of Health   Financial Resource Strain: Not on file  Food Insecurity: Not on file  Transportation Needs: Not on file  Physical Activity: Not on file  Stress: Not on file  Social Connections: Not on file   Additional Social History:    Allergies:   Allergies  Allergen Reactions   Morphine And Related Nausea And Vomiting   Prochlorperazine     REACTION: nerve reaction   Simvastatin     REACTION: leg cramps   Sulfa  Antibiotics     Labs:  Results for orders placed or performed during the hospital encounter of 08/09/20 (from the past 48 hour(s))  CBC     Status: Abnormal   Collection Time: 08/18/20  6:52 AM  Result Value Ref Range   WBC 7.9 4.0 - 10.5 K/uL   RBC 3.02 (L) 3.87 - 5.11 MIL/uL   Hemoglobin 9.1 (L) 12.0 - 15.0 g/dL   HCT 28.5 (L) 36.0 - 46.0 %   MCV 94.4 80.0 - 100.0 fL   MCH 30.1 26.0 - 34.0 pg   MCHC 31.9 30.0 - 36.0 g/dL   RDW 13.8 11.5 - 15.5 %   Platelets 273 150 - 400 K/uL   nRBC 0.0 0.0 - 0.2 %    Comment: Performed at [facility], [place] [address] [place], [place] [address]  Basic metabolic panel     Status: Abnormal   Collection Time: 08/18/20  6:52 AM  Result Value Ref Range   Sodium 137 135 - 145 mmol/L   Potassium 3.4 (L) 3.5 - 5.1 mmol/L   Chloride 102 98 - 111 mmol/L   CO2 27 22 - 32 mmol/L   Glucose, Bld 85 70 - 99 mg/dL    Comment: Glucose reference range applies only to samples taken after fasting for at least 8 hours.   BUN 9 8 - 23 mg/dL   Creatinine, Ser 0.68 0.44 - 1.00 mg/dL   Calcium 8.3 (L) 8.9 - 10.3 mg/dL   GFR, Estimated >60 >60 mL/min    Comment: (NOTE) Calculated using the CKD-EPI Creatinine Equation (2021)    Anion gap 8 5 - 15    Comment: Performed at [facility] [address] [place], [place] [address]  Creatinine, serum     Status: None   Collection Time: 08/19/20  7:31 AM  Result Value Ref Range   Creatinine, Ser 0.77 0.44 - 1.00 mg/dL   GFR, Estimated >60 >60 mL/min    Comment: (NOTE) Calculated using the CKD-EPI Creatinine Equation (2021) Performed at [facility] [address] [place], [place] [address]     Current Facility-Administered Medications  Medication Dose Route Frequency Provider Last Rate Last Admin   acetaminophen (TYLENOL) tablet 1,000 mg  1,000 mg Oral Q6H Norm Parcel, PA-C   1,000 mg at 08/19/20 6010   cholecalciferol (VITAMIN D3) tablet 1,000 Units  1,000 Units Oral QHS Nicholes Stairs, MD   1,000 Units at 08/18/20 2103   docusate  sodium (COLACE) capsule 100 mg  100 mg Oral BID Nicholes Stairs, MD   100 mg at 08/18/20 2103   enoxaparin (LOVENOX) injection 30 mg  30 mg Subcutaneous Q12H Nicholes Stairs, MD   30 mg at 08/19/20 9563   haloperidol (HALDOL) tablet 5 mg  5 mg Oral Q6H PRN Damita Dunnings B, MD       Or   haloperidol lactate (HALDOL) injection 2 mg  2 mg Intravenous Q6H PRN Damita Dunnings B, MD       haloperidol lactate (HALDOL) injection 5 mg  5 mg Intravenous Q6H PRN Jesusita Oka, MD   5 mg at 08/19/20 1205   HYDROmorphone (DILAUDID) injection 0.5 mg  0.5 mg Intravenous Q6H PRN Jesusita Oka, MD   0.5 mg at 08/19/20 0408   ketorolac (TORADOL) 15 MG/ML injection 15 mg  15 mg Intravenous Q6H Jesusita Oka, MD   15 mg at 08/19/20 8756   levothyroxine (SYNTHROID) tablet 100 mcg  100 mcg Oral QHS Nicholes Stairs, MD   100 mcg at 08/18/20 2102   menthol-cetylpyridinium (CEPACOL) lozenge 3 mg  1 lozenge Oral PRN Nicholes Stairs, MD       Or   phenol [facility]) mouth spray 1 spray  1 spray Mouth/Throat PRN Nicholes Stairs, MD       methocarbamol (ROBAXIN) tablet 1,000 mg  1,000 mg Oral QID Saverio Danker, PA-C   1,000 mg at 08/19/20 4332   metoCLOPramide (REGLAN) tablet 5-10 mg  5-10 mg Oral Q8H PRN Nicholes Stairs, MD       Or   metoCLOPramide (REGLAN) injection 5-10 mg  5-10 mg Intravenous Q8H PRN Nicholes Stairs, MD   10 mg at 08/19/20 0357   ondansetron (ZOFRAN-ODT) disintegrating tablet 4 mg  4 mg Oral Q6H PRN Nicholes Stairs, MD   4 mg at 08/18/20 1402   Or   ondansetron (ZOFRAN) injection 4 mg  4 mg Intravenous Q6H PRN Nicholes Stairs, MD   4 mg at 08/18/20 9518   oxyCODONE (Oxy IR/ROXICODONE) immediate release tablet 10-15 mg  10-15 mg Oral Q4H PRN Jesusita Oka, MD   15 mg at 08/18/20 2103   pantoprazole (PROTONIX) EC tablet 40 mg  40 mg Oral QHS Nicholes Stairs, MD   40 mg at  08/18/20 2103   PARoxetine (PAXIL) tablet 20 mg  20 mg Oral QHS Nicholes Stairs, MD   20 mg at 08/18/20 2103   polyethylene glycol (MIRALAX / GLYCOLAX) packet 17 g  17 g Oral Daily Saverio Danker, PA-C   17 g at 08/18/20 8416   predniSONE (DELTASONE) tablet 5 mg  5 mg Oral QHS Nicholes Stairs, MD   5 mg at 08/18/20 2102   promethazine (PHENERGAN) 12.5 mg in sodium chloride 0.9 % 50 mL IVPB  12.5 mg Intravenous Q6H PRN Nicholes Stairs, MD 200 mL/hr at 08/17/20 0500 12.5 mg at 08/17/20 0500   Or   promethazine (PHENERGAN) suppository 25 mg  25 mg Rectal Q6H PRN Nicholes Stairs, MD   25 mg at 08/18/20 6063   QUEtiapine (SEROQUEL) tablet 25 mg  25 mg Oral Aliene Altes, MD       scopolamine (TRANSDERM-SCOP) 1 MG/3DAYS 1.5 mg  1 patch Transdermal Q72H Nicholes Stairs, MD   1.5 mg at 08/18/20 0160   Teriparatide (Recombinant) SOPN 20 mcg  20 mcg Subcutaneous QHS Nicholes Stairs, MD   Given at  08/18/20 2103   traMADol (ULTRAM) tablet 50 mg  50 mg Oral Q6H Saverio Danker, PA-C   50 mg at 08/19/20 1540   Vitamin D (Ergocalciferol) (DRISDOL) capsule 50,000 Units  50,000 Units Oral Q Blake Divine, MD   50,000 Units at 08/15/20 0867    Musculoskeletal: Strength & Muscle Tone: within normal limits Gait & Station:  remains in bed Patient leans: N/A            Psychiatric Specialty Exam:  Presentation  General Appearance: [place]  Speech:Clear and Coherent  Speech Volume:Increased  Handedness: No data recorded  Mood and Affect  Mood:Irritable; Anxious  Affect:Congruent   Thought Process  Thought Processes:Linear  Descriptions of Associations:-- (cicumferential)  Orientation:Partial  Thought Content:Rumination  History of Schizophrenia/Schizoaffective disorder:No data recorded Duration of Psychotic Symptoms:No data recorded Hallucinations:Hallucinations: Tactile  Ideas of  Reference:Delusions  Suicidal Thoughts:Suicidal Thoughts: No  Homicidal Thoughts:Homicidal Thoughts: No   Sensorium  Memory:Immediate Poor; Recent Fair; Remote Good  Judgment:Impaired  Insight:None   Executive Functions  Concentration:Poor  Attention Span:Poor  Recall:Poor  Fund of Knowledge:Fair  Language:Fair   Psychomotor Activity  Psychomotor Activity:Psychomotor Activity: Restlessness   Assets  Assets:Communication Skills; Desire for Improvement; Resilience; Social Support   Sleep  Sleep:Sleep: Poor   Physical Exam: Physical Exam Constitutional:      Comments: screaming  HENT:     Head: Normocephalic and atraumatic.  Eyes:     Extraocular Movements: Extraocular movements intact.     Conjunctiva/sclera: Conjunctivae normal.  Cardiovascular:     Rate and Rhythm: Normal rate.  Pulmonary:     Effort: Pulmonary effort is normal.  Abdominal:     General: Abdomen is flat.  Musculoskeletal:        General: Normal range of motion.  Skin:    General: Skin is warm and dry.  Neurological:     Mental Status: She is alert. She is disoriented.   Review of Systems  Constitutional:  Negative for chills and fever.  HENT:  Negative for hearing loss.   Eyes:  Negative for blurred vision.  Respiratory:  Positive for shortness of breath. Negative for cough and wheezing.   Cardiovascular:  Negative for chest pain.  Gastrointestinal:  Negative for abdominal pain.  Neurological:  Positive for sensory change. Negative for dizziness.  Psychiatric/Behavioral:  Positive for hallucinations. The patient is nervous/anxious.   Blood pressure (!) 146/66, pulse 60, temperature (!) 97.5 F (36.4 C), temperature source Oral, resp. rate 17, height 5\' 2"  (1.575 m), weight 72.6 kg, SpO2 95 %. Body mass index is 29.26 kg/m.  Treatment Plan Summary: Daily contact with patient to assess and evaluate symptoms and progress in treatment  Hx of MDD dx Hyperactive Delirium likely  2/2 medications Patient appears delirious 2/2 her medication regimen. Patient appears to have had progressing delirium over the past 2 days that was worsened with benzodiazepine administration. Patient is having tactile hallucinations and was reporting to have some visual hallucinations earlier. Would recommend no further benzo administration as this can worsening her delirium. Would also recommend minimizing patient's opioid administration. - Start Seroquel 25mg  QHS - Haldol 5mg  PO or 2.5mg  IV q6 PRN for agitation -  Recommend Delirium precautions - Minimize use of opioid medications - EKG, patient has received multiple doses of Qtc prolonging medications   Disposition:  Recommend sitter will continue follow.   PGY-2 Freida Busman, MD 08/19/2020 2:01 PM

## 2020-08-19 NOTE — Progress Notes (Signed)
Extra dose of haldol administered due to patient's continuous screams. MD at bedside, psych attending paged to come to bedside to help with medication adjustment if there is any.

## 2020-08-19 NOTE — Progress Notes (Signed)
Patient is still screaming for anxiety, MD made aware. Rounding MD suggested that we not leave her alone. Psychiatrist outside discussing with patient's husband. Awaiting order for medication and safety sitter.

## 2020-08-19 NOTE — Telephone Encounter (Signed)
Unfortunately inside the hospital I do not have her images to change medications-I would recommend she continue to work with the [facility] team and express her concerns there.  We certainly can work on stabilizing this once she gets out of the hospital if not controlled by then but I am certainly hopeful she will improve

## 2020-08-19 NOTE — Progress Notes (Signed)
Patient asked for the nurse asking for nausea medicine. PRN Reglan administered & cold rug offered. Patient started screaming stating "Oh God! Oh God! I'm smothering". Probation officer & NT tried to [employer] and calm patient down but patient still screaming very loud. Pt. asking something for anxiety. VSS. 98% on room air. & HOB elevated. Probation officer paged E. Redmond Pulling, MD and new order received & IV Haldol administered. Patient made comfortable in bed & fall precautions in place.  Call belll within reach and will continue to monitor.

## 2020-08-19 NOTE — Progress Notes (Signed)
CTSP for persistent agitation. When I arrived to the patient's room, she was yelling that she needed some medication because she was having a panic attack. Her husband is at the bedside and was demanding to know "why she's like this...she wasn't like this two days ago...you all aren't doing anything". In fact, the patient has been quite belligerent, verbally combative, screaming to the point that she can be heard throughout the entire unit, and uncooperative with staff requests. This has indeed worsened over the last 24-48h despite attempts at medical management, which prompted a consult to Psychiatry today. The Psychiatry team has seen the patient, written a note, and provided explicit recommendations including a medication regimen which has been implemented. Patient is currently specifically demanding increased doses of medications already administered, with her shrieking demands punctuated by bellowing inquiries from her husband about why we are allowing her to suffer. I ordered a one time additional dose of 5mg  haldol, which was administered by the patient's nurse in my presence. Patient continued to demand that this was not enough, both before and after medication administration. I counseled both patient and her husband that my team would not be deviating from the care plan as set by the Psychiatry team. Husband then demanded to speak with someone from the Psychiatry team. Nurse advised to page Psychiatry and patient and husband both counseled that there is no guarantee that Psychiatry would return again today. Both patient and husband were clearly advised that yelling at me or any other staff is unacceptable and will not be tolerated. Husband continued with aggressive behavior coming less than 6 inches from my face. He was informed that he is unmasked, this is still a pandemic, and was asked to please step back, to which he complied.   After leaving the room, I instructed the patient's nurse and the unit  charge nurse to leave the patient's door closed to minimize disruptions to other patients by this patient's perpetual howling, counseled that no further medication adjustments regarding anxiety would be performed unless ordered by the Psychiatry team, and to call security for any further threatening or aggressive behavior by patient or her husband.   Jesusita Oka, MD General and [facility]

## 2020-08-19 NOTE — Telephone Encounter (Signed)
Patient's husband called in in regards to his wife being in the hospital right now. She was admitted last Wednesday for a broken shoulder and they had to replace her shoulder. Within the last two days [name] has been having very bad anxiety attacks and her husband states that he doesn't feel as if the Doctors in the hospital is controlling her anxiety well at all. He wants to know if there is anything that you can do for his wife?

## 2020-08-20 DIAGNOSIS — R410 Disorientation, unspecified: Secondary | ICD-10-CM | POA: Diagnosis not present

## 2020-08-20 LAB — TSH: TSH: 10.14 u[IU]/mL — ABNORMAL HIGH (ref 0.350–4.500)

## 2020-08-20 MED ORDER — ENSURE ENLIVE PO LIQD
237.0000 mL | Freq: Three times a day (TID) | ORAL | Status: DC
  Administered 2020-08-20 – 2020-08-22 (×3): 237 mL via ORAL

## 2020-08-20 MED ORDER — QUETIAPINE FUMARATE 50 MG PO TABS
25.0000 mg | ORAL_TABLET | Freq: Two times a day (BID) | ORAL | Status: DC
  Administered 2020-08-20 – 2020-08-22 (×5): 25 mg via ORAL
  Filled 2020-08-20 (×5): qty 1

## 2020-08-20 NOTE — Progress Notes (Signed)
Husband demanding to speak to the charge nurse. I went outside the room and spoke to husband and friend. I paged trauma service and psychiatry team. I stayed at the bedside with the patient for 1:30 hrs. She was yelling the either time. Attempted to make patient comfortable. Checked with pharmacy and gave Paxil early. Haldol dose wasn't due until 1800. Spoke to trauma PA, Jocelyn Lamer and she stated Dr. Bobbye Morton would come assess the patient due to the husband's demands.

## 2020-08-20 NOTE — Progress Notes (Signed)
   Trauma/Critical Care Follow Up Note  Subjective:    Still screaming out at times and complains of anxiety and panic.  Overall patient appears improved this morning.  She is sitting up in bed, awake and alert which is better than previous days.  She is still impulsive with random screams when she is just wanting to be moved up in bed or something small.  Asking more appropriate questions today.  But also states she is ready to go home and wants to go home today as her bed would be better and she just thinks she would do better at home.  Husband present at bedside.  Objective:  Vital signs for last 24 hours: Temp:  [97.7 F (36.5 C)-98.5 F (36.9 C)] 98.5 F (36.9 C) (07/09 0854) Pulse Rate:  [86-99] 86 (07/09 0854) Resp:  [18-20] 18 (07/09 0854) BP: (146-156)/(64-103) 150/75 (07/09 0854) SpO2:  [96 %-99 %] 96 % (07/09 0854)  Hemodynamic parameters for last 24 hours:    Intake/Output from previous day: 07/08 0701 - 07/09 0700 In: 600 [P.O.:600] Out: 650 [Urine:650]  Intake/Output this shift: No intake/output data recorded.  Vent settings for last 24 hours:    Physical Exam:  Gen: comfortable, more alert today Neuro: non-focal exam, f/c HEENT: PERRL Neck: supple CV: RRR Pulm: unlabored breathing Abd: soft, NT Ext: splint in place on RUE and dressing in place. MAE otherwise   No results found for this or any previous visit (from the past 24 hour(s)).   Assessment & Plan:   LOS: 10 days   GLF Comminuted R humerus fx - s/p reverse shoulder arthroplasty 7/1, per Dr. Stann Mainland, WBAT R tibial plateau fx and fibula fx - WBAT in KI per ortho B/l nasal bone frx - f/u as outpatient with ENT ABL anemia - hgb 8.8, stable Chronic nausea - home phenergan suppositories reordered Suicidal comments - no further, cleared by psych Severe anxiety - appreciate psych assistance.  Prn haldol and scheduled seroquel started last night.   FEN - reg diet, SLIV, Ensure while not  consistently eating much DVT - lovenox  ID - no current abx indicated   Dispo - likely home despite therapy recommendations per patient and husband request, but pending improvement is psych status  Henreitta Cea, PA-C Trauma & General Surgery Please use [email or web address] to contact on call provider  08/20/2020

## 2020-08-20 NOTE — Consult Note (Addendum)
[name] Consult   Reason for Consult:  Agitation Referring Physician:  Saverio Danker, PA Patient Identification: [name] MRN:  [id number] Principal Diagnosis: Acute delirium Diagnosis:  Principal Problem:   Acute delirium Active Problems:   Recurrent major depressive disorder, in full remission [facility])   Shoulder fracture   Fall   Total Time spent with patient: 20 minutes  Subjective:   [name] is a 70 y.o. female patient admitted with  s/p mechanical GLF after tripping. Landed on her right side, hit her head, no LOC. Patient has a PPH of depression.  HPI:  On assessment patient is observed to having audible snoring, and asleep. Writer attempted to arouse patient with physical shaking and verbal calling of her name, in which she did not respond. She is able to states that she is comfortable and sleeping finally.Chart review shows that she is much improved form her previous evaluations and nursing notes. She appears to be less irritable and agitated at this time. She was started on seroquel 25mg  po QHS and haldol prn. She denies any suicidal ideations and does not appear to be a danger to herself.   Past Psychiatric History: Dx w/ MDD, taking Paxil for 63 y  Risk to Self:  NO Risk to Others:  NO Prior Inpatient Therapy:  NO Prior Outpatient Therapy:  Paxil prescribed by PCP  Past Medical History:  Past Medical History:  Diagnosis Date   Anxiety    Arthritis    CAD (coronary artery disease)    Cataract    Depression    DISORDER, MENOPAUSAL NOS 03/20/2007   Qualifier: Diagnosis of  By: Arnoldo Morale MD, John E    GERD (gastroesophageal reflux disease)    Hyperlipidemia    Hypothyroidism    MYOCARDIAL INFARCTION, HX OF 09/09/2007   Qualifier: Diagnosis of  By: Leanne Chang MD, Bruce     Osteoporosis    Polymyalgia rheumatica [facility])     Past Surgical History:  Procedure Laterality Date   CATARACT EXTRACTION     CESAREAN SECTION     CHOLECYSTECTOMY N/A  11/24/2012   Procedure: LAPAROSCOPIC CHOLECYSTECTOMY;  Surgeon: Ralene Ok, MD;  Location: [place];  Service: General;  Laterality: N/A;   COSMETIC SURGERY     EYE SURGERY     eye lids lifted   PTCA     REVERSE SHOULDER ARTHROPLASTY Right 08/12/2020   Procedure: REVERSE SHOULDER ARTHROPLASTY;  Surgeon: Nicholes Stairs, MD;  Location: [place];  Service: Orthopedics;  Laterality: Right;   Family History:  Family History  Problem Relation Age of Onset   Hypertension Father    Heart disease Father        Mi age 88, 43, 70 and 45.   Cancer Father    Hyperlipidemia Father    Colon cancer Maternal Uncle    Hypertension Mother    Cancer Mother    Hyperlipidemia Mother    Pulmonary fibrosis Sister    Epilepsy Son    Healthy Son    Myasthenia gravis Son    Healthy Son    Rheum arthritis Niece    Lupus Niece    Rheum arthritis Niece    Family Psychiatric  History: Family Psychiatric  History: Sister had depression and required multiple medications over her lifetime Social History:  Social History   Substance and Sexual Activity  Alcohol Use No   Alcohol/week: 0.0 standard drinks     Social History   Substance and Sexual Activity  Drug Use  No    Social History   Socioeconomic History   Marital status: Married    Spouse name: Not on file   Number of children: Not on file   Years of education: Not on file   Highest education level: Not on file  Occupational History   Not on file  Tobacco Use   Smoking status: Never   Smokeless tobacco: Never  Vaping Use   Vaping Use: Never used  Substance and Sexual Activity   Alcohol use: No    Alcohol/week: 0.0 standard drinks   Drug use: No   Sexual activity: Not on file  Other Topics Concern   Not on file  Social History Narrative   Husband and 2 adult children live at home. No grandkids. Son with epilepsy.      Retired at age 39    [place]- at [employer].     Social Determinants of Health   Financial Resource  Strain: Not on file  Food Insecurity: Not on file  Transportation Needs: Not on file  Physical Activity: Not on file  Stress: Not on file  Social Connections: Not on file   Additional Social History:    Allergies:   Allergies  Allergen Reactions   Morphine And Related Nausea And Vomiting   Prochlorperazine     REACTION: nerve reaction   Simvastatin     REACTION: leg cramps   Sulfa Antibiotics     Labs:  Results for orders placed or performed during the hospital encounter of 08/09/20 (from the past 48 hour(s))  Creatinine, serum     Status: None   Collection Time: 08/19/20  7:31 AM  Result Value Ref Range   Creatinine, Ser 0.77 0.44 - 1.00 mg/dL   GFR, Estimated >60 >60 mL/min    Comment: (NOTE) Calculated using the CKD-EPI Creatinine Equation (2021) Performed at [facility], [place] [address] [place], [place] [address]   TSH     Status: Abnormal   Collection Time: 08/20/20 12:57 PM  Result Value Ref Range   TSH 10.140 (H) 0.350 - 4.500 uIU/mL    Comment: Performed by a 3rd Generation assay with a functional sensitivity of <=0.01 uIU/mL. Performed at [facility], [place] [address] [place], [place] [address]     Current Facility-Administered Medications  Medication Dose Route Frequency Provider Last Rate Last Admin   acetaminophen (TYLENOL) tablet 1,000 mg  1,000 mg Oral Q6H Norm Parcel, PA-C   1,000 mg at 08/20/20 1207   cholecalciferol (VITAMIN D3) tablet 1,000 Units  1,000 Units Oral QHS Nicholes Stairs, MD   1,000 Units at 08/19/20 2110   docusate sodium (COLACE) capsule 100 mg  100 mg Oral BID Nicholes Stairs, MD   100 mg at 08/19/20 2111   enoxaparin (LOVENOX) injection 30 mg  30 mg Subcutaneous Q12H Nicholes Stairs, MD   30 mg at 08/20/20 4970   feeding supplement (ENSURE ENLIVE / ENSURE PLUS) liquid 237 mL  237 mL Oral TID BM Saverio Danker, PA-C   237 mL at 08/20/20 1200   haloperidol (HALDOL) tablet 5 mg  5 mg Oral Q6H PRN  Damita Dunnings B, MD       Or   haloperidol lactate (HALDOL) injection 2 mg  2 mg Intravenous Q6H PRN Damita Dunnings B, MD   2 mg at 08/20/20 1207   HYDROmorphone (DILAUDID) injection 0.5 mg  0.5 mg Intravenous Q6H PRN Jesusita Oka, MD   0.5 mg at  08/19/20 0408   ketorolac (TORADOL) 15 MG/ML injection 15 mg  15 mg Intravenous Q6H Jesusita Oka, MD   15 mg at 08/20/20 1207   levothyroxine (SYNTHROID) tablet 100 mcg  100 mcg Oral QHS Nicholes Stairs, MD   100 mcg at 08/19/20 2110   menthol-cetylpyridinium (CEPACOL) lozenge 3 mg  1 lozenge Oral PRN Nicholes Stairs, MD       Or   phenol [facility]) mouth spray 1 spray  1 spray Mouth/Throat PRN Nicholes Stairs, MD       methocarbamol (ROBAXIN) tablet 1,000 mg  1,000 mg Oral QID Saverio Danker, PA-C   1,000 mg at 08/20/20 0908   ondansetron (ZOFRAN-ODT) disintegrating tablet 4 mg  4 mg Oral Q6H PRN Nicholes Stairs, MD   4 mg at 08/18/20 1402   Or   ondansetron (ZOFRAN) injection 4 mg  4 mg Intravenous Q6H PRN Nicholes Stairs, MD   4 mg at 08/18/20 7564   oxyCODONE (Oxy IR/ROXICODONE) immediate release tablet 10-15 mg  10-15 mg Oral Q4H PRN Jesusita Oka, MD   15 mg at 08/19/20 1940   pantoprazole (PROTONIX) EC tablet 40 mg  40 mg Oral QHS Nicholes Stairs, MD   40 mg at 08/19/20 2110   PARoxetine (PAXIL) tablet 20 mg  20 mg Oral QHS Pham, Minh Q, RPH-CPP       polyethylene glycol (MIRALAX / GLYCOLAX) packet 17 g  17 g Oral Daily Saverio Danker, PA-C   17 g at 08/18/20 3329   predniSONE (DELTASONE) tablet 5 mg  5 mg Oral QHS Nicholes Stairs, MD   5 mg at 08/19/20 2110   promethazine (PHENERGAN) 12.5 mg in sodium chloride 0.9 % 50 mL IVPB  12.5 mg Intravenous Q6H PRN Nicholes Stairs, MD 200 mL/hr at 08/17/20 0500 12.5 mg at 08/17/20 0500   Or   promethazine (PHENERGAN) suppository 25 mg  25 mg Rectal Q6H PRN Nicholes Stairs, MD   25 mg at 08/18/20 5188   QUEtiapine (SEROQUEL) tablet 25 mg   25 mg Oral BID Suella Broad, FNP   25 mg at 08/20/20 4166   scopolamine (TRANSDERM-SCOP) 1 MG/3DAYS 1.5 mg  1 patch Transdermal Q72H Nicholes Stairs, MD   1.5 mg at 08/18/20 0630   Teriparatide (Recombinant) SOPN 20 mcg  20 mcg Subcutaneous QHS Nicholes Stairs, MD   20 mcg at 08/19/20 2226   traMADol (ULTRAM) tablet 50 mg  50 mg Oral Q6H Saverio Danker, PA-C   50 mg at 08/20/20 1207   Vitamin D (Ergocalciferol) (DRISDOL) capsule 50,000 Units  50,000 Units Oral Q Blake Divine, MD   50,000 Units at 08/15/20 1601    Musculoskeletal: Strength & Muscle Tone: within normal limits Gait & Station:  remains in bed Patient leans: N/A            Psychiatric Specialty Exam:  Presentation  General Appearance: Appropriate for Environment (sedated)  Eye Contact:None  Speech:Clear and Coherent  Speech Volume:Normal  Handedness: Right  Mood and Affect  Mood:-- (fine)  Affect:Appropriate   Thought Process  Thought Processes:Coherent; Linear  Descriptions of Associations:Intact  Orientation:Full (Time, Place and Person)  Thought Content:Logical  History of Schizophrenia/Schizoaffective disorder:No data recorded Duration of Psychotic Symptoms:No data recorded Hallucinations:Hallucinations: None  Ideas of Reference:None  Suicidal Thoughts:Suicidal Thoughts: No  Homicidal Thoughts:Homicidal Thoughts: No   Sensorium  Memory:Immediate Poor; Recent Fair; Remote Good  Judgment:Impaired  Insight:None   Executive  Functions  Concentration:Poor  Attention Span:Poor  Recall:Poor  Fund of Knowledge:Fair  Language:Fair   Psychomotor Activity  Psychomotor Activity:Psychomotor Activity: Restlessness   Assets  Assets:Resilience; Financial Resources/Insurance; Armed forces logistics/support/administrative officer; Desire for Improvement; Housing; Leisure Time; Intimacy   Sleep  Sleep:Sleep: Poor   Physical Exam: Physical Exam Constitutional:       Comments: screaming  HENT:     Head: Normocephalic and atraumatic.  Eyes:     Extraocular Movements: Extraocular movements intact.     Conjunctiva/sclera: Conjunctivae normal.  Cardiovascular:     Rate and Rhythm: Normal rate.  Pulmonary:     Effort: Pulmonary effort is normal.  Abdominal:     General: Abdomen is flat.  Musculoskeletal:        General: Normal range of motion.  Skin:    General: Skin is warm and dry.  Neurological:     Mental Status: She is alert. She is disoriented.   Review of Systems  Constitutional:  Negative for chills and fever.  HENT:  Negative for hearing loss.   Eyes:  Negative for blurred vision.  Respiratory:  Positive for shortness of breath. Negative for cough and wheezing.   Cardiovascular:  Negative for chest pain.  Gastrointestinal:  Negative for abdominal pain.  Neurological:  Positive for sensory change. Negative for dizziness.  Psychiatric/Behavioral:  Positive for hallucinations. The patient is nervous/anxious.   Blood pressure (!) 150/75, pulse 86, temperature 98.5 F (36.9 C), temperature source Oral, resp. rate 18, height 5\' 2"  (1.575 m), weight 72.6 kg, SpO2 96 %. Body mass index is 29.26 kg/m.  Treatment Plan Summary: Psych cleared at this time. Continue current medication recommendations.   Hx of MDD dx Hyperactive Delirium likely 2/2 medications -Appears to be improving, continue seroquel 25mg  po BID.  -Would recommend no further benzo administration as this can worsening her delirium. Would also recommend minimizing patient's opioid administration. -  Recommend Delirium precautions - Minimize use of opioid medications - EKG, within normal QTc442.  -Have ordered TSH (>10) -Recommend appropriate management of hypothyroidism as it can also contribute to her mental slowness, irritability, emotional lability, fatigue.  -Recommend follow up with outpatient psychiatry.   Disposition:  Psych cleared at this time.   Suella Broad, FNP 08/20/2020 3:10 PM Patient seen face-to-face for psychiatric evaluation, chart reviewed and case discussed with the physician extender and developed treatment plan. Reviewed the information documented and agree with the treatment plan. Corena Pilgrim, MD

## 2020-08-20 NOTE — Progress Notes (Signed)
Occupational Therapy Treatment Patient Details Name: [name] MRN: [id number] DOB: 08-13-1950 Today's Date: 08/20/2020    History of present illness 70 y.o. female who was [facility] to [facility]/28 after mechanical GLF; landed on her right side, hit her head, no LOC. Now pt with Right tibia plateau fx (non-operative management with KI, WBAT), Right shoulder fx (Reverse shoulder arthroplasty 7/01, okay for WB on RW, no AROM), and B nasal bone fractures. PMH: Chronic low back pain with history of L1 compression fracture, MI 2009, osteoporosis, CAD, hypothyroidism, hyperlipidemia, polymyalgia rheumatica   OT comments  Pt limited by pain and anxiety this session. Requiring max A +2 for transfers from [facility]. Pt requiring education on hand placement and precautions prior to each sit<>stand, due to pt focusing on pain and anxiety. Pt completed 4 sit<>stands with a min+ rest break between each, all requiring max +2 to stand. All LB ADL's and pericare continue to require total assist. Acute OT to continue to follow to assist with ADL performance, pain management, and functional mobility.    Follow Up Recommendations  SNF;Supervision/Assistance - 24 hour;Follow surgeon's recommendation for DC plan and follow-up therapies    Equipment Recommendations  3 in 1 bedside commode;Other (comment);Wheelchair (measurements OT);Wheelchair cushion (measurements OT);Hospital bed;Tub/shower seat    Recommendations for Other Services      Precautions / Restrictions Precautions Precautions: Shoulder;Fall Type of Shoulder Precautions: Reverse shoulder Shoulder Interventions: Shoulder sling/immobilizer;At all times;Off for dressing/bathing/exercises Precaution Booklet Issued: Yes (comment) Precaution Comments: "Sling At all times except ADL / exercise. Non weight bearing. AROM elbow, wrist and hand to tolerance. OK for pendulums. Forward Flexion 0-90. No Abduction. No AROM of shoulder. ok for WBAT with walker or  crutch" Required Braces or Orthoses: Knee Immobilizer - Right Knee Immobilizer - Right: On at all times Restrictions Weight Bearing Restrictions: Yes RUE Weight Bearing: Weight bear through elbow only RLE Weight Bearing: Weight bearing as tolerated       Mobility Bed Mobility Overal bed mobility: Needs Assistance         Sit to supine: Max assist;+2 for physical assistance;+2 for safety/equipment        Transfers Overall transfer level: Needs assistance Equipment used: Rolling walker (2 wheeled) Transfers: Sit to/from Stand Sit to Stand: Max assist;+2 physical assistance         General transfer comment: Attempted standing x4, requiring max +2 each stand and 1 pivot transfer required Max A +2    Balance Overall balance assessment: Needs assistance Sitting-balance support: Single extremity supported;Feet supported Sitting balance-Leahy Scale: Fair   Postural control: Posterior lean Standing balance support: Single extremity supported Standing balance-Leahy Scale: Zero Standing balance comment: heavy external assistance to maintain standing.                           ADL either performed or assessed with clinical judgement   ADL Overall ADL's : Needs assistance/impaired                         Toilet Transfer: Maximal assistance;+2 for physical assistance;+2 for safety/equipment   Toileting- Clothing Manipulation and Hygiene: Total assistance;+2 for physical assistance;+2 for safety/equipment;Sit to/from stand       Functional mobility during ADLs: Maximal assistance;+2 for safety/equipment;+2 for physical assistance General ADL Comments: Pt very limited by pain and anxiety, unable to focus on much else.     Vision       Perception  Praxis      Cognition Arousal/Alertness: Awake/alert Behavior During Therapy: Agitated;Anxious Overall Cognitive Status: Impaired/Different from baseline                                  General Comments: Pt continues to yell out in pain "Oh God" over and over again, and reports that she "just wants him to take her"        Exercises Exercises: Shoulder Shoulder Exercises Elbow Flexion: AAROM;5 reps Elbow Extension: AAROM;5 reps Wrist Flexion: AROM;5 reps Wrist Extension: AROM;5 reps Digit Composite Flexion: AROM;5 reps   Shoulder Instructions Shoulder Instructions Donning/doffing sling/immobilizer: Maximal assistance Correct positioning of sling/immobilizer: Supervision/safety ROM for elbow, wrist and digits of operated UE: Minimal assistance Positioning of UE while sleeping: Minimal assistance     General Comments Pt continues to have extremely high anxiety and is unable to focus on much else.    Pertinent Vitals/ Pain       Pain Assessment: Faces Faces Pain Scale: Hurts worst Pain Location: R LE with movement, hip, back.  Shoulder hurts to a lesser degree than the knee Pain Descriptors / Indicators: Discomfort;Grimacing;Guarding;Crying;Moaning Pain Intervention(s): Limited activity within patient's tolerance;Monitored during session;Repositioned;Patient requesting pain meds-RN notified  Home Living                                          Prior Functioning/Environment              Frequency  Min 2X/week        Progress Toward Goals  OT Goals(current goals can now be found in the care plan section)  Progress towards OT goals: Not progressing toward goals - comment  Acute Rehab OT Goals Patient Stated Goal: To lessen pain OT Goal Formulation: With patient Time For Goal Achievement: 08/27/20 Potential to Achieve Goals: Fair ADL Goals Pt Will Perform Grooming: with modified independence;sitting Pt Will Perform Upper Body Bathing: with min guard assist;sitting Pt Will Perform Lower Body Bathing: with min assist;sit to/from stand Pt Will Perform Upper Body Dressing: with supervision;sitting Pt Will Perform Lower Body  Dressing: with min assist;sit to/from stand Pt Will Transfer to Toilet: with min guard assist;ambulating;bedside commode Pt Will Perform Toileting - Clothing Manipulation and hygiene: with supervision;sitting/lateral leans Pt/caregiver will Perform Home Exercise Program: Right Upper extremity;With Supervision;With written HEP provided  Plan Discharge plan remains appropriate;Frequency remains appropriate    Co-evaluation                 AM-PAC OT "6 Clicks" Daily Activity     Outcome Measure   Help from another person eating meals?: A Little Help from another person taking care of personal grooming?: A Little Help from another person toileting, which includes using toliet, bedpan, or urinal?: Total Help from another person bathing (including washing, rinsing, drying)?: A Lot Help from another person to put on and taking off regular upper body clothing?: A Lot Help from another person to put on and taking off regular lower body clothing?: Total 6 Click Score: 12    End of Session Equipment Utilized During Treatment: Rolling walker;Gait belt;Right knee immobilizer  OT Visit Diagnosis: Other abnormalities of gait and mobility (R26.89);Repeated falls (R29.6);Muscle weakness (generalized) (M62.81);History of falling (Z91.81);Pain Pain - Right/Left: Right Pain - part of body: Shoulder;Knee   Activity Tolerance Patient limited by pain  Patient Left in bed;with call bell/phone within reach;with bed alarm set   Nurse Communication Mobility status        Time: 1026-1050 OT Time Calculation (min): 24 min  Charges: OT General Charges $OT Visit: 1 Visit OT Treatments $Self Care/Home Management : 23-37 mins  Miara Emminger H., OTR/L Acute Rehabilitation  Zabdi Mis Elane Camaya [employer] 08/20/2020, 11:36 AM

## 2020-08-20 NOTE — Progress Notes (Signed)
Provider paged secondary to patient being non-complaint with the tele monitor. Awaiting response.

## 2020-08-21 NOTE — Progress Notes (Signed)
Physical Therapy Treatment Patient Details Name: [name] MRN: [id number] DOB: Aug 01, 1950 Today's Date: 08/21/2020    History of Present Illness 70 y.o. female who was transferred to [facility] 6/28 after mechanical GLF; landed on her right side, hit her head, no LOC. Now pt with Right tibia plateau fx (non-operative management with KI, WBAT), Right shoulder fx (Reverse shoulder arthroplasty 7/01, okay for WB on RW, no AROM), and B nasal bone fractures. PMH: Chronic low back pain with history of L1 compression fracture, MI 2009, osteoporosis, CAD, hypothyroidism, hyperlipidemia, polymyalgia rheumatica    PT Comments    Focused session on decreasing pt's fear of falling and progressing her OOB mobility. Success noted with pt remaining calm and not displaying a posterior lean when pulling up and supporting self using L hand on back aspect of recliner. Pt able to demonstrate good bil weight shifting in this position. However, when cued to progress to lifting either leg off the ground slightly while standing in place she became anxious and reported R knee pain preventing her from being able to take steps today. Pt with increased posterior lean when transitioned to support on therapist anterior to her instead. Pt needing min-modAx2 for bed mobility and basic transfers at this time. Discussed d/c plan in extensive detail with pt and her husband, with them agreeing to pursue SNF at this time. Will continue to follow acutely.     Follow Up Recommendations  SNF;Supervision/Assistance - 24 hour (if don't do SNF then Outpatient PT)     Equipment Recommendations  Rolling walker with 5" wheels;3in1 (PT);Wheelchair (measurements PT);Wheelchair cushion (measurements PT);Hospital bed    Recommendations for Other Services       Precautions / Restrictions Precautions Precautions: Shoulder;Fall Type of Shoulder Precautions: Reverse shoulder Shoulder Interventions: Shoulder sling/immobilizer;At all times;Off  for dressing/bathing/exercises Precaution Booklet Issued: Yes (comment) Precaution Comments: "Sling At all times except ADL / exercise. Non weight bearing. AROM elbow, wrist and hand to tolerance. OK for pendulums. Forward Flexion 0-90. No Abduction. No AROM of shoulder. ok for WBAT with walker or crutch" Required Braces or Orthoses: Knee Immobilizer - Right Knee Immobilizer - Right: On at all times Restrictions Weight Bearing Restrictions: Yes RUE Weight Bearing: Non weight bearing RLE Weight Bearing: Weight bearing as tolerated Other Position/Activity Restrictions: RUE WBAT for RW    Mobility  Bed Mobility Overal bed mobility: Needs Assistance Bed Mobility: Supine to Sit     Supine to sit: Min assist;+2 for physical assistance;HOB elevated     General bed mobility comments: MinAx2 to manage trunk and legs to exit L EOB with HOB elevated.    Transfers Overall transfer level: Needs assistance Equipment used: 1 person hand held assist Transfers: Sit to/from [facility] Sit to Stand: Mod assist;Min assist;+2 physical assistance;+2 safety/equipment Stand pivot transfers: Min assist;Mod assist;+2 physical assistance;+2 safety/equipment       General transfer comment: Sit to stand 1x from EOB > back of recliner with pt pulling up on handle to try to reduce pt's fear of falling, noted pt remained calm thus apparent success. Sit to stand EOB to L HHA 1x and commode to L HHA 1x. Cues provided with each sit to stand transfer to bring R leg posteriorly as she continued to rise. Posterior lean with final 2 reps, needing cues to correct and prevent posterior LOB. Stand pivot, not lifting feet off ground but rather scooting them on ground, to transfer to L 2x bed > commode and commode > recliner with L  HHA. Min-modAx2 for all transfers.  Ambulation/Gait             General Gait Details: Unable to lift feet to take steps at this time. Performed pre-gait training with pt  holding onto back of chair to reduce anxiety, cuing pt to shift weight laterally, good weight shifting. Unable to lift feet to march in place though as she became anxious and noted pain in R knee.   Stairs             Wheelchair Mobility    Modified Rankin (Stroke Patients Only)       Balance Overall balance assessment: Needs assistance Sitting-balance support: Single extremity supported;Feet supported Sitting balance-Leahy Scale: Fair     Standing balance support: Single extremity supported Standing balance-Leahy Scale: Poor Standing balance comment: L UE support and min-modA for standing.                            Cognition Arousal/Alertness: Awake/alert Behavior During Therapy: Anxious Overall Cognitive Status: Impaired/Different from baseline Area of Impairment: Following commands;Problem solving;Safety/judgement                       Following Commands: Follows one step commands with increased time Safety/Judgement: Decreased awareness of deficits;Decreased awareness of safety   Problem Solving: Slow processing;Decreased initiation;Difficulty sequencing;Requires verbal cues;Requires tactile cues General Comments: Pt with high anxiety with standing, husband reporting fear of falling. Needing continual encouragement throughout session to keep her calm and agreeable to advancing standing mobility. Pt slow to process and respond to cues.      Exercises General Exercises - Lower Extremity Quad Sets: Both;5 reps;Supine Straight Leg Raises: Both;5 reps;Supine;AROM    General Comments General comments (skin integrity, edema, etc.): Extensive discussion with pt and her husband in regards to her progress, limited gait at this time impacting safety and independence at home, need for assistance if they were to decide to d/c home, and benefits of a SNF stay for rehab prior to d/c home. Agreeable to pursuing d/c to SNF at this time.      Pertinent  Vitals/Pain Pain Assessment: Faces Faces Pain Scale: Hurts little more Pain Location: R knee with movement; Shoulder hurts to a lesser degree than the knee Pain Descriptors / Indicators: Discomfort;Grimacing;Guarding;Moaning Pain Intervention(s): Limited activity within patient's tolerance;Monitored during session;Repositioned    Home Living                      Prior Function            PT Goals (current goals can now be found in the care plan section) Acute Rehab PT Goals Patient Stated Goal: To walk again PT Goal Formulation: With patient/family Time For Goal Achievement: 08/27/20 Potential to Achieve Goals: Fair Progress towards PT goals: Progressing toward goals    Frequency    Min 3X/week      PT Plan Current plan remains appropriate;Equipment recommendations need to be updated    Co-evaluation PT/OT/SLP Co-Evaluation/Treatment: Yes Reason for Co-Treatment: Necessary to address cognition/behavior during functional activity;For patient/therapist safety;To address functional/ADL transfers PT goals addressed during session: Mobility/safety with mobility;Balance        AM-PAC PT "6 Clicks" Mobility   Outcome Measure  Help needed turning from your back to your side while in a flat bed without using bedrails?: A Lot Help needed moving from lying on your back to sitting on the side of a  flat bed without using bedrails?: A Lot Help needed moving to and from a bed to a chair (including a wheelchair)?: Total Help needed standing up from a chair using your arms (e.g., wheelchair or bedside chair)?: Total Help needed to walk in hospital room?: Total Help needed climbing 3-5 steps with a railing? : Total 6 Click Score: 8    End of Session Equipment Utilized During Treatment: Gait belt;Right knee immobilizer;Other (comment) (sling) Activity Tolerance: Patient limited by pain;Other (comment) (limited by fear of falling) Patient left: in chair;with call bell/phone  within reach;with chair alarm set;with family/visitor present Nurse Communication: Mobility status PT Visit Diagnosis: Unsteadiness on feet (R26.81);Muscle weakness (generalized) (M62.81);History of falling (Z91.81);Difficulty in walking, not elsewhere classified (R26.2);Pain Pain - Right/Left: Right Pain - part of body: Shoulder;Leg     Time: 4580-9983 PT Time Calculation (min) (ACUTE ONLY): 47 min  Charges:  $Therapeutic Activity: 23-37 mins                     Moishe Spice, PT, DPT Acute Rehabilitation Services  Pager: [phone] Office: [phone]    Orvan Falconer 08/21/2020, 5:06 PM

## 2020-08-21 NOTE — Progress Notes (Signed)
Occupational Therapy Treatment Patient Details Name: [name] MRN: [id number] DOB: [birth year] Today's Date: 08/21/2020    History of present illness 70 y.o. female who was transferred to [facility] 6/28 after mechanical GLF; landed on her right side, hit her head, no LOC. Now pt with Right tibia plateau fx (non-operative management with KI, WBAT), Right shoulder fx (Reverse shoulder arthroplasty 7/01, okay for WB on RW, no AROM), and B nasal bone fractures. PMH: Chronic low back pain with history of L1 compression fracture, MI 2009, osteoporosis, CAD, hypothyroidism, hyperlipidemia, polymyalgia rheumatica   OT comments  Pt continues to be limited by anxiety and fear of falling as well as pain in RLE, however pt is making incremental progress towards OT goals. Pt completed 2 sit to stands with mod A and transferred from [facility] to bed. Pt experienced loss of balance with inability to correct herself, requiring max A from OT and husband to finish transfer to bed. Pt continues to present with increased weakness and instability. Skilled intensive therapies will be the best option for pt once discharged to address strength, functional mobility, balance, ADL performance, and safety prior to returning home. Acute OT will continue to follow address deficits listed below.    Follow Up Recommendations  SNF;Supervision/Assistance - 24 hour;Follow surgeon's recommendation for DC plan and follow-up therapies    Equipment Recommendations  3 in 1 bedside commode;Other (comment);Wheelchair (measurements OT);Wheelchair cushion (measurements OT);Hospital bed;Tub/shower seat    Recommendations for Other Services      Precautions / Restrictions Precautions Precautions: Shoulder;Fall Type of Shoulder Precautions: Reverse shoulder Shoulder Interventions: Shoulder sling/immobilizer;At all times;Off for dressing/bathing/exercises Precaution Booklet Issued: Yes (comment) Precaution Comments: "Sling At all times except  ADL / exercise. Non weight bearing. AROM elbow, wrist and hand to tolerance. OK for pendulums. Forward Flexion 0-90. No Abduction. No AROM of shoulder. ok for WBAT with walker or crutch" Required Braces or Orthoses: Knee Immobilizer - Right Knee Immobilizer - Right: On at all times Restrictions Weight Bearing Restrictions: Yes RUE Weight Bearing: Non weight bearing RLE Weight Bearing: Weight bearing as tolerated       Mobility Bed Mobility Overal bed mobility: Needs Assistance Bed Mobility: Sit to Supine       Sit to supine: Mod assist   General bed mobility comments: Mod a for trunk control and BLE managament back to bed.    Transfers Overall transfer level: Needs assistance Equipment used: Rolling walker (2 wheeled) Transfers: Sit to/from Stand Sit to Stand: Mod assist;From elevated surface         General transfer comment: Stood from [facility] with mod A to power up and maintain balance.    Balance Overall balance assessment: Needs assistance Sitting-balance support: Single extremity supported;Feet supported Sitting balance-Leahy Scale: Fair     Standing balance support: Single extremity supported Standing balance-Leahy Scale: Zero Standing balance comment: heavy external assistance to maintain standing.                           ADL either performed or assessed with clinical judgement   ADL Overall ADL's : Needs assistance/impaired                         Toilet Transfer: Minimal assistance;Maximal assistance;+2 for physical assistance;+2 for safety/equipment;Stand-pivot Toilet Transfer Details (indicate cue type and reason): Pt started with min A for transfer, became anxious while pivotting and lost her balance, requiring max a +2 assist to  assist her back to bed. Toileting- Clothing Manipulation and Hygiene: Moderate assistance;Sitting/lateral lean;Sit to/from stand Toileting - Clothing Manipulation Details (indicate cue type and reason): Mod A  for thoroughness and when standing     Functional mobility during ADLs: Minimal assistance;Moderate assistance;Rolling walker General ADL Comments: Pt became very anxious with standing and lost her balance, during transfer back to bede. Limited by anxiety and weakness     Vision       Perception     Praxis      Cognition Arousal/Alertness: Awake/alert Behavior During Therapy: Agitated;Anxious Overall Cognitive Status: Impaired/Different from baseline                                 General Comments: Pt very anxious about falling, which caused her to lose balance during the session.        Exercises     Shoulder Instructions       General Comments Pt continues with fear of falling and mild anxiety, no longer yelling out in pain.    Pertinent Vitals/ Pain       Pain Assessment: Faces Faces Pain Scale: Hurts little more Pain Location: R LE with movement, hip, back.  Shoulder hurts to a lesser degree than the knee Pain Descriptors / Indicators: Discomfort;Grimacing;Guarding;Crying;Moaning Pain Intervention(s): Limited activity within patient's tolerance;Monitored during session;Repositioned  Home Living                                          Prior Functioning/Environment              Frequency  Min 2X/week        Progress Toward Goals  OT Goals(current goals can now be found in the care plan section)  Progress towards OT goals: Progressing toward goals  Acute Rehab OT Goals Patient Stated Goal: To walk again OT Goal Formulation: With patient Time For Goal Achievement: 08/27/20 Potential to Achieve Goals: Fair ADL Goals Pt Will Perform Grooming: with modified independence;sitting Pt Will Perform Upper Body Bathing: with min guard assist;sitting Pt Will Perform Lower Body Bathing: with min assist;sit to/from stand Pt Will Perform Upper Body Dressing: with supervision;sitting Pt Will Perform Lower Body Dressing: with  min assist;sit to/from stand Pt Will Transfer to Toilet: with min guard assist;ambulating;bedside commode Pt Will Perform Toileting - Clothing Manipulation and hygiene: with supervision;sitting/lateral leans Pt/caregiver will Perform Home Exercise Program: Right Upper extremity;With Supervision;With written HEP provided  Plan Discharge plan remains appropriate;Frequency remains appropriate    Co-evaluation                 AM-PAC OT "6 Clicks" Daily Activity     Outcome Measure   Help from another person eating meals?: A Little Help from another person taking care of personal grooming?: A Little Help from another person toileting, which includes using toliet, bedpan, or urinal?: A Lot Help from another person bathing (including washing, rinsing, drying)?: A Lot Help from another person to put on and taking off regular upper body clothing?: A Lot Help from another person to put on and taking off regular lower body clothing?: Total 6 Click Score: 13    End of Session Equipment Utilized During Treatment: Gait belt;Rolling walker;Right knee immobilizer  OT Visit Diagnosis: Other abnormalities of gait and mobility (R26.89);Repeated falls (R29.6);Muscle weakness (generalized) (M62.81);History of falling (  Z91.81);Pain Pain - Right/Left: Right Pain - part of body: Shoulder;Knee   Activity Tolerance Patient limited by pain   Patient Left in bed;with call bell/phone within reach;with bed alarm set   Nurse Communication Mobility status        Time: 8185-9093 OT Time Calculation (min): 11 min  Charges: OT General Charges $OT Visit: 1 Visit OT Treatments $Self Care/Home Management : 8-22 mins  Jamirah Zelaya H., OTR/L Acute Rehabilitation   Tenaya Hilyer Elane Kahlie Deutscher 08/21/2020, 1:13 PM

## 2020-08-21 NOTE — TOC Progression Note (Addendum)
Transition of Care [facility]) - Progression Note    Patient Details  Name: [name] MRN: [id number] Date of Birth: 04/02/1950  Transition of Care [facility]) CM/SW [place], [place] Phone Number: 08/21/2020, 2:45 PM  Clinical Narrative:    CSW uploaded clinical for passr.  CSW uploaded SNF selections as requested.  Passr pending. CSW attempted to contact pt's spouse by phone.  CSW left vm for return call. TOC will continue to assist with disposition planning. 4:04pm: CSW spoke with pt's spouse concerning SNF choices.  CSW gave pt's spouse medicare.gov website to review possible SNF placement for pt.  Expected Discharge Plan: [place] Barriers to Discharge: Continued Medical Work up  Expected Discharge Plan and Services Expected Discharge Plan: [place]   Discharge Planning Services: CM Consult   Living arrangements for the past 2 months: Single Family Home                                       Social Determinants of Health (SDOH) Interventions    Readmission Risk Interventions No flowsheet data found.

## 2020-08-21 NOTE — TOC Progression Note (Cosign Needed)
Transition of Care [facility]) - Progression Note    Patient Details  Name: [name] MRN: [id number] Date of Birth: 02/18/1950  Transition of Care [facility]) CM/SW [place], [place] Phone Number: 08/21/2020, 1:28 PM  Clinical Narrative:    RE: [name] Date of Birth: [birth year] Date: 08/21/20  Please be advised that the above-named patient will require a short-term nursing home stay - anticipated 30 days or less for rehabilitation and strengthening.  The plan is for return home.    Expected Discharge Plan: [place] Barriers to Discharge: Continued Medical Work up  Expected Discharge Plan and Services Expected Discharge Plan: [place]   Discharge Planning Services: CM Consult   Living arrangements for the past 2 months: Single Family Home                                       Social Determinants of Health (SDOH) Interventions    Readmission Risk Interventions No flowsheet data found.

## 2020-08-21 NOTE — NC FL2 (Signed)
[place] LEVEL OF CARE SCREENING TOOL     IDENTIFICATION  Patient Name: [name] Birthdate: 03-Mar-1950 Sex: female Admission Date (Current Location): 08/09/2020  [facility] [place] Number:  Herbalist and Address:  [facility]. [facility], [place] [address] [place], [place] [address]      Provider Number: [id number]  Attending Physician Name and Address:  Md, Trauma, MD  Relative Name and Phone Number:  Fawnda Vitullo [phone]    Current Level of Care: Hospital Recommended Level of Care: [place] Prior Approval Number:    Date Approved/Denied:   PASRR Number: Pending  Discharge Plan: SNF    Current Diagnoses: Patient Active Problem List   Diagnosis Date Noted   Acute delirium 08/20/2020   Shoulder fracture 08/10/2020   Fall 08/10/2020   Lumbar compression fracture ([place]) 05/11/2019   Educated about COVID-19 virus infection 01/26/2019   Dyslipidemia 01/26/2019   Drug-induced myopathy 01/14/2019   Haglund's deformity 01/12/2019   Myalgia 12/30/2018   High risk medication use 12/04/2018   Recurrent major depressive disorder, in full remission ([place]) 09/03/2018   Lower esophageal ring ([place]) 03/03/2018   Osteoarthritis of right hip 12/02/2017   GERD (gastroesophageal reflux disease) 12/15/2015   Polyp of colon, adenomatous 10/11/2014   Vitamin D deficiency 05/26/2014   Osteoporosis 05/26/2014   Polymyalgia rheumatica ([place]) 12/01/2010   CAD (coronary artery disease) 09/09/2007   Hematuria 09/09/2007   Depression 03/20/2007   Hypothyroid 02/13/1999    Orientation RESPIRATION BLADDER Height & Weight     Self, Time, Situation, Place  Normal Incontinent, External catheter Weight: 160 lb (72.6 kg) Height:  5\' 2"  (157.5 cm)  BEHAVIORAL SYMPTOMS/MOOD NEUROLOGICAL BOWEL NUTRITION STATUS      Continent Diet (See dc summary)  AMBULATORY STATUS COMMUNICATION OF NEEDS Skin   Extensive Assist Verbally Surgical  wounds (Closed incision R shoulder 08/12/20)                       Personal Care Assistance Level of Assistance  Bathing, Feeding, Dressing Bathing Assistance: Limited assistance Feeding assistance: Independent Dressing Assistance: Limited assistance     Functional Limitations Info  Sight, Hearing, Speech Sight Info: Impaired Hearing Info: Adequate Speech Info: Adequate    SPECIAL CARE FACTORS FREQUENCY  PT (By licensed PT), OT (By licensed OT)     PT Frequency: 5x week OT Frequency: 5x week            Contractures Contractures Info: Not present    Additional Factors Info  Code Status, Allergies, Psychotropic Code Status Info: Full Allergies Info: Morphine And Related   Prochlorperazine   Simvastatin   Sulfa Antibiotics Psychotropic Info: QUEtiapine (SEROQUEL) tablet 25 mg 2x daily, traMADol (ULTRAM) tablet 50 mg every 6 hours         Current Medications (08/21/2020):  This is the current hospital active medication list Current Facility-Administered Medications  Medication Dose Route Frequency Provider Last Rate Last Admin   acetaminophen (TYLENOL) tablet 1,000 mg  1,000 mg Oral Q6H Barkley Boards R, PA-C   1,000 mg at 08/21/20 1607   cholecalciferol (VITAMIN D3) tablet 1,000 Units  1,000 Units Oral QHS Nicholes Stairs, MD   1,000 Units at 08/20/20 2045   docusate sodium (COLACE) capsule 100 mg  100 mg Oral BID Nicholes Stairs, MD   100 mg at 08/19/20 2111   enoxaparin (LOVENOX) injection 30 mg  30 mg Subcutaneous Q12H Nicholes Stairs,  MD   30 mg at 08/21/20 0955   feeding supplement (ENSURE ENLIVE / ENSURE PLUS) liquid 237 mL  237 mL Oral TID BM Saverio Danker, PA-C   237 mL at 08/21/20 1001   haloperidol (HALDOL) tablet 5 mg  5 mg Oral Q6H PRN Damita Dunnings B, MD       Or   haloperidol lactate (HALDOL) injection 2 mg  2 mg Intravenous Q6H PRN Damita Dunnings B, MD   2 mg at 08/20/20 1807   HYDROmorphone (DILAUDID) injection 0.5 mg  0.5 mg  Intravenous Q6H PRN Jesusita Oka, MD   0.5 mg at 08/19/20 0408   ketorolac (TORADOL) 15 MG/ML injection 15 mg  15 mg Intravenous Q6H Jesusita Oka, MD   15 mg at 08/21/20 0517   levothyroxine (SYNTHROID) tablet 100 mcg  100 mcg Oral QHS Nicholes Stairs, MD   100 mcg at 08/20/20 2042   menthol-cetylpyridinium (CEPACOL) lozenge 3 mg  1 lozenge Oral PRN Nicholes Stairs, MD       Or   phenol [facility]) mouth spray 1 spray  1 spray Mouth/Throat PRN Nicholes Stairs, MD       methocarbamol (ROBAXIN) tablet 1,000 mg  1,000 mg Oral QID Saverio Danker, PA-C   1,000 mg at 08/21/20 0956   ondansetron (ZOFRAN-ODT) disintegrating tablet 4 mg  4 mg Oral Q6H PRN Nicholes Stairs, MD   4 mg at 08/18/20 1402   Or   ondansetron (ZOFRAN) injection 4 mg  4 mg Intravenous Q6H PRN Nicholes Stairs, MD   4 mg at 08/18/20 4496   oxyCODONE (Oxy IR/ROXICODONE) immediate release tablet 10-15 mg  10-15 mg Oral Q4H PRN Jesusita Oka, MD   10 mg at 08/20/20 2042   pantoprazole (PROTONIX) EC tablet 40 mg  40 mg Oral QHS Nicholes Stairs, MD   40 mg at 08/20/20 2045   PARoxetine (PAXIL) tablet 20 mg  20 mg Oral QHS Pham, Minh Q, RPH-CPP   20 mg at 08/20/20 2045   polyethylene glycol (MIRALAX / GLYCOLAX) packet 17 g  17 g Oral Daily Saverio Danker, PA-C   17 g at 08/21/20 0958   predniSONE (DELTASONE) tablet 5 mg  5 mg Oral QHS Nicholes Stairs, MD   5 mg at 08/20/20 2045   promethazine (PHENERGAN) 12.5 mg in sodium chloride 0.9 % 50 mL IVPB  12.5 mg Intravenous Q6H PRN Nicholes Stairs, MD 200 mL/hr at 08/17/20 0500 12.5 mg at 08/17/20 0500   Or   promethazine (PHENERGAN) suppository 25 mg  25 mg Rectal Q6H PRN Nicholes Stairs, MD   25 mg at 08/18/20 7591   QUEtiapine (SEROQUEL) tablet 25 mg  25 mg Oral BID Suella Broad, FNP   25 mg at 08/21/20 0956   scopolamine (TRANSDERM-SCOP) 1 MG/3DAYS 1.5 mg  1 patch Transdermal Q72H Nicholes Stairs, MD   1.5 mg  at 08/21/20 6384   Teriparatide (Recombinant) SOPN 20 mcg  20 mcg Subcutaneous QHS Nicholes Stairs, MD   Given at 08/20/20 2045   traMADol (ULTRAM) tablet 50 mg  50 mg Oral Q6H Saverio Danker, PA-C   50 mg at 08/21/20 6659   Vitamin D (Ergocalciferol) (DRISDOL) capsule 50,000 Units  50,000 Units Oral Q Blake Divine, MD   50,000 Units at 08/15/20 9357     Discharge Medications: Please see discharge summary for a list of discharge medications.  Relevant Imaging Results:  Relevant Lab  Results:   Additional Information SSN [id number]  Carolin Sicks, [place]

## 2020-08-21 NOTE — Progress Notes (Signed)
   Trauma/Critical Care Follow Up Note  Subjective:    Patient significantly improved today.  Sitting up in NAD, denies any anxiety or panic.  Denies any pain.  Last oxy was 10mg  last night at 2045.  Ate some yesterday.    Objective:  Vital signs for last 24 hours: Temp:  [97.8 F (36.6 C)-98.3 F (36.8 C)] 98.3 F (36.8 C) (07/10 0831) Pulse Rate:  [71-87] 77 (07/10 0831) Resp:  [16-18] 16 (07/10 0403) BP: (137-149)/(69-81) 149/81 (07/10 0831) SpO2:  [95 %-98 %] 96 % (07/10 0831)  Hemodynamic parameters for last 24 hours:    Intake/Output from previous day: 07/09 0701 - 07/10 0700 In: 580 [P.O.:580] Out: 400 [Blood:400]  Intake/Output this shift: No intake/output data recorded.  Vent settings for last 24 hours:    Physical Exam:  Gen: comfortable, NAD Neuro: non-focal exam, f/c HEENT: PERRL Neck: supple CV: RRR Pulm: unlabored breathing Abd: soft, NT Ext: splint in place on RUE and dressing in place. MAE otherwise Psych: mental status significantly improved today.  Oriented, denies anxiety or panic   Results for orders placed or performed during the hospital encounter of 08/09/20 (from the past 24 hour(s))  TSH     Status: Abnormal   Collection Time: 08/20/20 12:57 PM  Result Value Ref Range   TSH 10.140 (H) 0.350 - 4.500 uIU/mL     Assessment & Plan:   LOS: 11 days   GLF Comminuted R humerus fx - s/p reverse shoulder arthroplasty 7/1, per Dr. Stann Mainland, WBAT R tibial plateau fx and fibula fx - WBAT in [place] per ortho B/l nasal bone frx - f/u as outpatient with ENT ABL anemia - hgb 8.8, stable Chronic nausea - home phenergan suppositories reordered Suicidal comments - no further, cleared by psych Severe anxiety - appreciate psych assistance.  Significantly improved.  Will have therapies reassess today for home recommendations and they would like to go home.  Likely do seroquel 7 days at home and stop per psych   FEN - reg diet, SLIV, Ensure DVT - lovenox   ID - no current abx indicated   Dispo - likely home after therapy re-eval today vs tomorrow pending husband's comfort level and how she does.  Henreitta Cea, PA-C Trauma & General Surgery Please use [email or web address] to contact on call provider  08/21/2020

## 2020-08-22 ENCOUNTER — Other Ambulatory Visit (HOSPITAL_COMMUNITY): Payer: Self-pay

## 2020-08-22 DIAGNOSIS — S4290XA Fracture of unspecified shoulder girdle, part unspecified, initial encounter for closed fracture: Secondary | ICD-10-CM | POA: Diagnosis not present

## 2020-08-22 DIAGNOSIS — R410 Disorientation, unspecified: Secondary | ICD-10-CM | POA: Diagnosis not present

## 2020-08-22 DIAGNOSIS — R531 Weakness: Secondary | ICD-10-CM | POA: Diagnosis not present

## 2020-08-22 MED ORDER — ACETAMINOPHEN 500 MG PO TABS: 1000.0000 mg | ORAL_TABLET | Freq: Four times a day (QID) | ORAL | 0 refills | Status: DC | PRN

## 2020-08-22 MED ORDER — QUETIAPINE FUMARATE 25 MG PO TABS
25.0000 mg | ORAL_TABLET | Freq: Two times a day (BID) | ORAL | 0 refills | Status: DC
  Filled 2020-08-22: qty 14, 7d supply, fill #0

## 2020-08-22 MED ORDER — OXYCODONE HCL 10 MG PO TABS
5.0000 mg | ORAL_TABLET | Freq: Four times a day (QID) | ORAL | 0 refills | Status: DC | PRN
  Filled 2020-08-22: qty 30, 7d supply, fill #0

## 2020-08-22 MED ORDER — METHOCARBAMOL 500 MG PO TABS
1000.0000 mg | ORAL_TABLET | Freq: Three times a day (TID) | ORAL | 0 refills | Status: DC | PRN
  Filled 2020-08-22: qty 45, 8d supply, fill #0

## 2020-08-22 NOTE — Progress Notes (Signed)
Patient discharged to home with instructions, read and given to patient and her husband at bedside, all belongings including her prescribed meds from pharmacy was sent with patient.

## 2020-08-22 NOTE — Discharge Summary (Signed)
Patient ID: [name] [id number] Jul 08, 1950 70 y.o.  Admit date: 08/09/2020 Discharge date: 08/22/2020  Admitting Diagnosis: GLF Comminuted R humerus fx  R tibial plateau frx R fibula fx B/l nasal bone frx  Discharge Diagnosis Patient Active Problem List   Diagnosis Date Noted   Acute delirium 08/20/2020   Shoulder fracture 08/10/2020   Fall 08/10/2020   Lumbar compression fracture ([place]) 05/11/2019   Educated about COVID-19 virus infection 01/26/2019   Dyslipidemia 01/26/2019   Drug-induced myopathy 01/14/2019   Haglund's deformity 01/12/2019   Myalgia 12/30/2018   High risk medication use 12/04/2018   Recurrent major depressive disorder, in full remission ([place]) 09/03/2018   Lower esophageal ring ([place]) 03/03/2018   Osteoarthritis of right hip 12/02/2017   GERD (gastroesophageal reflux disease) 12/15/2015   Polyp of colon, adenomatous 10/11/2014   Vitamin D deficiency 05/26/2014   Osteoporosis 05/26/2014   Polymyalgia rheumatica ([place]) 12/01/2010   CAD (coronary artery disease) 09/09/2007   Hematuria 09/09/2007   Depression 03/20/2007   Hypothyroid 02/13/1999  GLF Comminuted R humerus fx R tibial plateau fx and fibula fx  B/l nasal bone frx  ABL anemia  Chronic nausea  Suicidal comments  Severe anxiety  Consultants Dr. Victorino December, ortho Psych  Reason for Admission: 30F s/p mechanical GLF after tripping. Landed on her right side, hit her head, no LOC.   Procedures Dr. Victorino December, 08/12/20 REVERSE SHOULDER ARTHROPLASTY  Hospital Course:  GLF  Comminuted R humerus fx  The patient was seen by orthopedics who recommended surgical intervention for this fracture.  She then underwent a reverse shoulder arthroplasty on 7/1 by Dr. Stann Mainland.  She was placed in a sling and this should remain on unless with therapies.  She is WBAT on a walker or crutch; however, no active ROM.  Her ability to work with therapies was very limited initially due to pain  control and psychosis likely secondary to acute hospital stay, narcotics, anesthesia, etc.  R tibial plateau fx and fibula fx  Dr. Stann Mainland also followed this injury, and she is WBAT in a KI.  See above as she worked with therapies but this was limited until her psychosis improved.  B/l nasal bone frx  She may f/u as outpatient with ENT as needed.  ABL anemia  She has some mild anemia which stabilized out around 8.8.  no intervention needed for this.  Chronic nausea  She has some issues intermittently with nausea and her home phenergan suppositories were reordered.  As this was controlled her diet tolerance improved.  Suicidal comments  Initially in her hospital stay, very early on, she expressed frustrations and a feeling like she "couldn't do this and didn't want to live."  Psych saw her and felt this was frustration from her situation and she was no harm to herself.  She remained on her Paxil during her stay.  Severe anxiety/psychosis  For multiple days post op, the patient developed severe anxiety/panic/psychosis.  This was initially attempted to be managed with some ativan, but this caused hallucinations.  She was then given some prn haldol.  This continued to remain a problem and psych was reconsulted.  They recommended initiation of seroquel 25mg  BID.  Within 48 hrs the patient was mentally back to her baseline, improved oral intake, and improved ability to work with therapies.  She will go home on 7 more days and then stop this medication.  Hypothyroidism She was on her normal home dose of 164mcg.  Her TSH was  found to be slightly elevated here.  Given this was mild, we remained on this dose and I will have her follow up with her PCP for a recheck to make sure this doesn't need adjustment after acuity has settled.    I have contacted and through inbasket updated her PCP, Dr. Yong Channel, on her hospital stay.  She will need to see him in post hospital follow up at discharge.  The patient  worked with therapies.  The recommendation at time of discharge was still SNF however the patient and her husband would still like to go home.  We will arrange [facility] services as available as well as all equipment recommended by therapies.  The patient was medically stable for DC home on HD 12.  Physical Exam: Gen: comfortable, NAD Neuro: non-focal exam, f/c HEENT: PERRL Neck: supple CV: RRR Pulm: unlabored breathing Abd: soft, NT Ext: sling in place on RUE and dressing in place. MAE otherwise Psych: mental status normal today, A&Ox3  Allergies as of 08/22/2020       Reactions   Morphine And Related Nausea And Vomiting   Prochlorperazine    REACTION: nerve reaction   Simvastatin    REACTION: leg cramps   Sulfa Antibiotics         Medication List     TAKE these medications    acetaminophen 500 MG tablet Commonly known as: TYLENOL Take 2 tablets (1,000 mg total) by mouth every 6 (six) hours as needed.   aspirin 81 MG EC tablet Take 81 mg by mouth at bedtime.   cholecalciferol 25 MCG (1000 UNIT) tablet Commonly known as: VITAMIN D3 Take 1,000 Units by mouth at bedtime.   methocarbamol 500 MG tablet Commonly known as: ROBAXIN Take 2 tablets (1,000 mg total) by mouth every 8 (eight) hours as needed for muscle spasms.   Oxycodone HCl 10 MG Tabs Take 0.5-1 tablets (5-10 mg total) by mouth every 6 (six) hours as needed for moderate pain.   QUEtiapine 25 MG tablet Commonly known as: SEROQUEL Take 1 tablet (25 mg total) by mouth 2 (two) times daily for 7 days.   Teriparatide (Recombinant) 600 MCG/2.4ML Sopn Inject 20 mcg into the skin daily.       ASK your doctor about these medications    levothyroxine 100 MCG tablet Commonly known as: SYNTHROID TAKE 1 TABLET BY MOUTH DAILY   pantoprazole 40 MG tablet Commonly known as: PROTONIX Take 1 tablet (40 mg total) by mouth daily.   PARoxetine 20 MG tablet Commonly known as: PAXIL TAKE 1 TABLET BY MOUTH EVERY MORNING    predniSONE 5 MG tablet Commonly known as: DELTASONE TAKE 1 TABLET BY MOUTH EVERY DAY WITH BREAKFAST   Repatha SureClick 619 MG/ML [place] Generic drug: Evolocumab INJECT 1 PEN SUBCUTANEOUSLY EVERY 14 DAYS   Vitamin D (Ergocalciferol) 1.25 MG (50000 UNIT) Caps capsule Commonly known as: DRISDOL Take 1 capsule (50,000 Units total) by mouth every 7 (seven) days.               Durable Medical Equipment  (From admission, onward)           Start     Ordered   08/22/20 0822  For home use only DME Tub bench  Once        08/22/20 0822   08/22/20 0821  For home use only DME 3 n 1  Once        08/22/20 5093   08/22/20 0821  For home use only DME  Hospital bed  Once       Question Answer Comment  Length of Need 6 Months   The above medical condition requires: Patient requires the ability to reposition frequently   Bed type Semi-electric      08/22/20 0086              Follow-up Information     Nicholes Stairs, MD Follow up in 2 week(s).   Specialty: Orthopedic Surgery Why: For wound re-check Contact information: [address] [place] [address] [place] [address] [phone]         [name], MD. Schedule an appointment as soon as possible for a visit in 1 week(s).   Specialty: Family Medicine Contact information: [address] [place] [address] [place], [place] Follow up.   Why: As needed for nasal bone fracture.  this should heal on it's own Contact information: [place] [address] [phone]                 Signed: Saverio Danker, [facility] 08/22/2020, 8:28 AM Please see Amion for pager number during day hours 7:00am-4:30pm, 7-11:30am on Weekends

## 2020-08-22 NOTE — Progress Notes (Addendum)
Occupational Therapy Treatment Patient Details Name: [name] MRN: [id number] DOB: 03/14/1950 Today's Date: 08/21/2020    History of present illness 70 y.o. female who was transferred to [facility] 6/28 after mechanical GLF; landed on her right side, hit her head, no LOC. Now pt with Right tibia plateau fx (non-operative management with KI, WBAT), Right shoulder fx (Reverse shoulder arthroplasty 7/01, okay for WB on RW, no AROM), and B nasal bone fractures. PMH: Chronic low back pain with history of L1 compression fracture, MI 2009, osteoporosis, CAD, hypothyroidism, hyperlipidemia, polymyalgia rheumatica   OT comments  Pt seen for second session today to assist with decreasing pt's fear of falling and progress her functional mobility with +2 safety of OT/PT cotreat session. Pt' appeared more calm this session with OOB mobility, when having additional assist and a chair follow when standing. Pt required additional assist with toileting transfers and toileting hygiene/clean up.  Pt and family expressing more agreement this session as well to pursue SNF level therapies prior to returning home. Acute OT will continue to follow to assist with ADL performance and functional mobility.    Follow Up Recommendations  SNF;Supervision/Assistance - 24 hour;Follow surgeon's recommendation for DC plan and follow-up therapies    Equipment Recommendations  3 in 1 bedside commode;Other (comment);Wheelchair (measurements OT);Wheelchair cushion (measurements OT);Hospital bed;Tub/shower seat    Recommendations for Other Services      Precautions / Restrictions Precautions Precautions: Shoulder;Fall Type of Shoulder Precautions: Reverse shoulder Shoulder Interventions: Shoulder sling/immobilizer;At all times;Off for dressing/bathing/exercises Precaution Booklet Issued: Yes (comment) Precaution Comments: "Sling At all times except ADL / exercise. Non weight bearing. AROM elbow, wrist and hand to tolerance. OK for  pendulums. Forward Flexion 0-90. No Abduction. No AROM of shoulder. ok for WBAT with walker or crutch" Required Braces or Orthoses: Knee Immobilizer - Right Knee Immobilizer - Right: On at all times Restrictions Weight Bearing Restrictions: Yes RUE Weight Bearing: Non weight bearing RLE Weight Bearing: Weight bearing as tolerated Other Position/Activity Restrictions: RUE WBAT for RW       Mobility Bed Mobility Overal bed mobility: Needs Assistance Bed Mobility: Supine to Sit     Supine to sit: Min assist;+2 for physical assistance;HOB elevated     General bed mobility comments: MinAx2 to manage trunk and legs to exit L EOB with HOB elevated.    Transfers Overall transfer level: Needs assistance Equipment used: 1 person hand held assist Transfers: Sit to/from [facility] Sit to Stand: Mod assist;Min assist;+2 physical assistance;+2 safety/equipment Stand pivot transfers: Min assist;Mod assist;+2 physical assistance;+2 safety/equipment       General transfer comment: Sit to stand 1x from EOB > back of recliner with pt pulling up on handle to try to reduce pt's fear of falling, noted pt remained calm thus apparent success. Sit to stand EOB to L HHA 1x and commode to L HHA 1x. Cues provided with each sit to stand transfer to bring R leg posteriorly as she continued to rise. Posterior lean with final 2 reps, needing cues to correct and prevent posterior LOB. Stand pivot, not lifting feet off ground but rather scooting them on ground, to transfer to L 2x bed > commode and commode > recliner with L HHA. Min-modAx2 for all transfers.    Balance Overall balance assessment: Needs assistance Sitting-balance support: Single extremity supported;Feet supported Sitting balance-Leahy Scale: Fair     Standing balance support: Single extremity supported Standing balance-Leahy Scale: Poor Standing balance comment: L UE support and min-modA for standing.  ADL either performed or assessed with clinical judgement   ADL Overall ADL's : Needs assistance/impaired     Grooming: Wash/dry hands;Wash/dry face;Set up;Sitting Grooming Details (indicate cue type and reason): Completed on [facility]     Lower Body Bathing: Moderate assistance;Sitting/lateral leans Lower Body Bathing Details (indicate cue type and reason): On [facility]         Toilet Transfer: Minimal assistance;Moderate assistance;+2 for safety/equipment;Stand-pivot   Toileting- Clothing Manipulation and Hygiene: Moderate assistance;Sitting/lateral lean;Sit to/from stand Toileting - Clothing Manipulation Details (indicate cue type and reason): Mod A for thoroughness and when standing     Functional mobility during ADLs: Minimal assistance;Moderate assistance;+2 for physical assistance;+2 for safety/equipment General ADL Comments: Pt working on balance, increasing anxiety required increased assist to finish transfer     Vision       Perception     Praxis      Cognition Arousal/Alertness: Awake/alert Behavior During Therapy: Anxious Overall Cognitive Status: Impaired/Different from baseline Area of Impairment: Following commands;Problem solving;Safety/judgement                       Following Commands: Follows one step commands with increased time Safety/Judgement: Decreased awareness of deficits;Decreased awareness of safety   Problem Solving: Slow processing;Decreased initiation;Difficulty sequencing;Requires verbal cues;Requires tactile cues General Comments: Pt with high anxiety with standing, husband reporting fear of falling. Needing continual encouragement throughout session to keep her calm and agreeable to advancing standing mobility. Pt slow to process and respond to cues.        Exercises Shoulder Exercises Elbow Flexion: AAROM;5 reps Elbow Extension: AAROM;5 reps Wrist Flexion: AROM;5 reps Wrist Extension: AROM;5 reps Digit Composite  Flexion: AROM;5 reps Neck Lateral Flexion - Right: AROM;5 reps Neck Lateral Flexion - Left: AROM;5 reps   Shoulder Instructions       General Comments Extensive discussion with pt and her husband in regards to her progress, limited gait at this time impacting safety and independence at home, need for assistance if they were to decide to d/c home, and benefits of a SNF stay for rehab prior to d/c home. Agreeable to pursuing d/c to SNF at this time.    Pertinent Vitals/ Pain       Pain Assessment: Faces Faces Pain Scale: Hurts little more Pain Location: R knee with movement; Shoulder hurts to a lesser degree than the knee Pain Descriptors / Indicators: Discomfort;Grimacing;Guarding;Moaning Pain Intervention(s): Limited activity within patient's tolerance;Monitored during session;Repositioned  Home Living                                          Prior Functioning/Environment              Frequency  Min 2X/week        Progress Toward Goals  OT Goals(current goals can now be found in the care plan section)  Progress towards OT goals: Progressing toward goals  Acute Rehab OT Goals Patient Stated Goal: To walk again OT Goal Formulation: With patient Time For Goal Achievement: 08/27/20 Potential to Achieve Goals: Fair ADL Goals Pt Will Perform Grooming: with modified independence;sitting Pt Will Perform Upper Body Bathing: with min guard assist;sitting Pt Will Perform Lower Body Bathing: with min assist;sit to/from stand Pt Will Perform Upper Body Dressing: with supervision;sitting Pt Will Perform Lower Body Dressing: with min assist;sit to/from stand Pt Will Transfer to Toilet: with min guard assist;ambulating;bedside  commode Pt Will Perform Toileting - Clothing Manipulation and hygiene: with supervision;sitting/lateral leans Pt/caregiver will Perform Home Exercise Program: Right Upper extremity;With Supervision;With written HEP provided  Plan Discharge  plan remains appropriate;Frequency remains appropriate    Co-evaluation      Reason for Co-Treatment: Necessary to address cognition/behavior during functional activity;For patient/therapist safety;To address functional/ADL transfers PT goals addressed during session: Mobility/safety with mobility;Balance OT goals addressed during session: ADL's and self-care;Proper use of [employer] equipment and DME      AM-PAC OT "6 Clicks" Daily Activity     Outcome Measure   Help from another person eating meals?: A Little Help from another person taking care of personal grooming?: A Little Help from another person toileting, which includes using toliet, bedpan, or urinal?: A Lot Help from another person bathing (including washing, rinsing, drying)?: A Lot Help from another person to put on and taking off regular upper body clothing?: A Lot Help from another person to put on and taking off regular lower body clothing?: Total 6 Click Score: 13    End of Session Equipment Utilized During Treatment: Gait belt  OT Visit Diagnosis: Other abnormalities of gait and mobility (R26.89);Repeated falls (R29.6);Muscle weakness (generalized) (M62.81);History of falling (Z91.81);Pain Pain - Right/Left: Right Pain - part of body: Shoulder;Knee   Activity Tolerance Patient limited by pain   Patient Left in chair;with call bell/phone within reach;with chair alarm set   Nurse Communication Mobility status        Time: 9417-4081 OT Time Calculation (min): 47 min  Charges: OT General Charges $OT Visit: 1 Visit OT Treatments $Self Care/Home Management : 8-22 mins  Essie Gehret H., OTR/L Acute Rehabilitation  Braelon Sprung Elane Ayesha Markwell 08/22/2020, 12:26 AM

## 2020-08-22 NOTE — Progress Notes (Signed)
PT Cancellation Note  Patient Details Name: [name] MRN: [id number] DOB: 12-01-1950   Cancelled Treatment:    Reason Eval/Treat Not Completed: Other (comment).  Pt/husband ready for d/c home.  PT talked through getting into the house and function from [name] level, reviewed precautions and answered questions from husband.  Both feel comfortable d/c home with home therapy.    Thanks,  Verdene Lennert, PT, DPT  Acute Rehabilitation Ortho Tech Supervisor [phone] pager [phone]) [phone] office      Barbarann Ehlers [name] 08/22/2020, 2:11 PM

## 2020-08-22 NOTE — Telephone Encounter (Signed)
Called and spoke with pt husband and relayed below message. Pt husband states she is doing better and they are planning on discharging her today.

## 2020-08-22 NOTE — TOC Transition Note (Signed)
Transition of Care [facility]) - CM/SW Discharge Note   Patient Details  Name: [name] MRN: [id number] Date of Birth: 02/11/1951  [facility] CM/SW Contact:  Ella Bodo, RN Phone Number: 08/22/2020, 4:18 PM   Clinical Narrative: Patient much improved; now oriented x4 and pleasant.  She is medically stable for discharge home today with her spouse to provide 24-hour care.  PT/OT recommending home health follow-up, though unable to secure home health services due to insurance contracts and staffing issues.  Patient/husband agreeable to outpatient therapy; referral to [facility] for follow-up.  Referral to [place] for recommended DME; hospital bed and tub bench to be delivered to home.  Patient states she already has 3 and 1, walker, and wheelchair at home.    Final next level of care: OP Rehab Barriers to Discharge: Barriers Resolved   Patient Goals and CMS Choice Patient states their goals for this hospitalization and ongoing recovery are:: to go home               Discharge Plan and Services   Discharge Planning Services: CM Consult            DME Arranged: Hospital bed, Tub bench DME Agency: [employer] Date DME Agency Contacted: 08/22/20 Time DME Agency Contacted: 1011 Representative spoke with at DME Agency: [place] ([place]) Interventions     Readmission Risk Interventions Readmission Risk Prevention Plan 08/22/2020  Post Dischage Appt Complete  Medication Screening Complete  Transportation Screening Complete  Some recent data might be hidden   Reinaldo Raddle, RN, BSN  Trauma/Neuro ICU Case Manager [phone]

## 2020-08-22 NOTE — Progress Notes (Signed)
Patient requires a hospital bed as she requires frequent repositioning and adjustments that a hospital bed can provide.  [name] 8:24 AM 08/22/2020

## 2020-08-24 ENCOUNTER — Telehealth: Payer: Self-pay

## 2020-08-24 NOTE — Telephone Encounter (Signed)
We are available if needed of course

## 2020-08-24 NOTE — Telephone Encounter (Cosign Needed)
Transition Care Management Follow-up Telephone Call Date of discharge and from where: 08/22/20 [place] [facility] How have you been since you were released from the hospital? resting Any questions or concerns? No  Items Reviewed: Did the pt receive and understand the discharge instructions provided? Yes  Medications obtained and verified? Yes  Other? No  Any new allergies since your discharge? No  Dietary orders reviewed? Yes Do you have support at home? Yes   Home Care and Equipment/Supplies: Were home health services ordered? not applicable If so, what is the name of the agency?   Has the agency set up a time to come to the patient's home? not applicable Were any new equipment or medical supplies ordered?  No What is the name of the medical supply agency?  Were you able to get the supplies/equipment? not applicable Do you have any questions related to the use of the equipment or supplies? No  Functional Questionnaire: (I = Independent and D = Dependent) ADLs: D  Bathing/Dressing- D  Meal Prep- D  Eating- D  Maintaining continence- D  Transferring/Ambulation- D  Managing Meds- D  Follow up appointments reviewed:  PCP Hospital f/u appt confirmed? No   Specialist Hospital f/u appt confirmed? Yes  Scheduled to see Surgeon  on 08/26/20 @ 8:30 a.m. Are transportation arrangements needed? No  If their condition worsens, is the pt aware to call PCP or go to the Emergency Dept.? Yes Was the patient provided with contact information for the PCP's office or ED? Yes Was to pt encouraged to call back with questions or concerns? Yes

## 2020-08-26 DIAGNOSIS — M25561 Pain in right knee: Secondary | ICD-10-CM | POA: Diagnosis not present

## 2020-08-26 DIAGNOSIS — M1711 Unilateral primary osteoarthritis, right knee: Secondary | ICD-10-CM | POA: Diagnosis not present

## 2020-08-26 DIAGNOSIS — Z4789 Encounter for other orthopedic aftercare: Secondary | ICD-10-CM | POA: Diagnosis not present

## 2020-08-30 ENCOUNTER — Ambulatory Visit: Payer: PPO | Admitting: Gastroenterology

## 2020-09-04 ENCOUNTER — Emergency Department (HOSPITAL_COMMUNITY): Payer: PPO

## 2020-09-04 ENCOUNTER — Emergency Department (HOSPITAL_COMMUNITY)
Admission: EM | Admit: 2020-09-04 | Discharge: 2020-09-04 | Disposition: A | Discharge status: Home / Self Care | Payer: PPO | Attending: Emergency Medicine | Admitting: Emergency Medicine

## 2020-09-04 ENCOUNTER — Other Ambulatory Visit: Payer: Self-pay

## 2020-09-04 ENCOUNTER — Encounter (HOSPITAL_COMMUNITY): Payer: Self-pay

## 2020-09-04 DIAGNOSIS — Z79899 Other long term (current) drug therapy: Secondary | ICD-10-CM | POA: Insufficient documentation

## 2020-09-04 DIAGNOSIS — Z96611 Presence of right artificial shoulder joint: Secondary | ICD-10-CM | POA: Diagnosis not present

## 2020-09-04 DIAGNOSIS — Z7982 Long term (current) use of aspirin: Secondary | ICD-10-CM | POA: Diagnosis not present

## 2020-09-04 DIAGNOSIS — R1111 Vomiting without nausea: Secondary | ICD-10-CM

## 2020-09-04 DIAGNOSIS — G4489 Other headache syndrome: Secondary | ICD-10-CM | POA: Diagnosis not present

## 2020-09-04 DIAGNOSIS — I2510 Atherosclerotic heart disease of native coronary artery without angina pectoris: Secondary | ICD-10-CM | POA: Diagnosis not present

## 2020-09-04 DIAGNOSIS — R9431 Abnormal electrocardiogram [ECG] [EKG]: Secondary | ICD-10-CM | POA: Diagnosis not present

## 2020-09-04 DIAGNOSIS — R112 Nausea with vomiting, unspecified: Secondary | ICD-10-CM | POA: Diagnosis not present

## 2020-09-04 DIAGNOSIS — R519 Headache, unspecified: Secondary | ICD-10-CM | POA: Insufficient documentation

## 2020-09-04 DIAGNOSIS — Z86010 Personal history of colonic polyps: Secondary | ICD-10-CM | POA: Diagnosis not present

## 2020-09-04 DIAGNOSIS — R110 Nausea: Secondary | ICD-10-CM | POA: Diagnosis not present

## 2020-09-04 DIAGNOSIS — I10 Essential (primary) hypertension: Secondary | ICD-10-CM | POA: Diagnosis not present

## 2020-09-04 DIAGNOSIS — S0990XA Unspecified injury of head, initial encounter: Secondary | ICD-10-CM | POA: Diagnosis not present

## 2020-09-04 DIAGNOSIS — E039 Hypothyroidism, unspecified: Secondary | ICD-10-CM | POA: Insufficient documentation

## 2020-09-04 DIAGNOSIS — T07XXXA Unspecified multiple injuries, initial encounter: Secondary | ICD-10-CM | POA: Diagnosis not present

## 2020-09-04 LAB — COMPREHENSIVE METABOLIC PANEL
ALT: 23 U/L (ref 0–44)
AST: 29 U/L (ref 15–41)
Albumin: 3.2 g/dL — ABNORMAL LOW (ref 3.5–5.0)
Alkaline Phosphatase: 154 U/L — ABNORMAL HIGH (ref 38–126)
Anion gap: 9 (ref 5–15)
BUN: 11 mg/dL (ref 8–23)
CO2: 23 mmol/L (ref 22–32)
Calcium: 9.3 mg/dL (ref 8.9–10.3)
Chloride: 107 mmol/L (ref 98–111)
Creatinine, Ser: 0.85 mg/dL (ref 0.44–1.00)
GFR, Estimated: >60 mL/min (ref >60)
Glucose, Bld: 91 mg/dL (ref 70–99)
Potassium: 3.5 mmol/L (ref 3.5–5.1)
Sodium: 139 mmol/L (ref 135–145)
Total Bilirubin: 0.7 mg/dL (ref 0.3–1.2)
Total Protein: 6.4 g/dL — ABNORMAL LOW (ref 6.5–8.1)

## 2020-09-04 LAB — CBC WITH DIFFERENTIAL/PLATELET
Abs Immature Granulocytes: 0.03 10*3/uL (ref 0.00–0.07)
Basophils Absolute: 0.1 10*3/uL (ref 0.0–0.1)
Basophils Relative: 1 %
Eosinophils Absolute: 0.2 10*3/uL (ref 0.0–0.5)
Eosinophils Relative: 3 %
HCT: 38.2 % (ref 36.0–46.0)
Hemoglobin: 11.5 g/dL — ABNORMAL LOW (ref 12.0–15.0)
Immature Granulocytes: 0 %
Lymphocytes Relative: 16 %
Lymphs Abs: 1.1 10*3/uL (ref 0.7–4.0)
MCH: 30.8 pg (ref 26.0–34.0)
MCHC: 30.1 g/dL (ref 30.0–36.0)
MCV: 102.4 fL — ABNORMAL HIGH (ref 80.0–100.0)
Monocytes Absolute: 0.7 10*3/uL (ref 0.1–1.0)
Monocytes Relative: 10 %
Neutro Abs: 4.7 10*3/uL (ref 1.7–7.7)
Neutrophils Relative %: 70 %
Platelets: 333 10*3/uL (ref 150–400)
RBC: 3.73 MIL/uL — ABNORMAL LOW (ref 3.87–5.11)
RDW: 15.3 % (ref 11.5–15.5)
WBC: 6.7 10*3/uL (ref 4.0–10.5)
nRBC: 0 % (ref 0.0–0.2)

## 2020-09-04 LAB — LIPASE, BLOOD: Lipase: 66 U/L — ABNORMAL HIGH (ref 11–51)

## 2020-09-04 MED ORDER — PROMETHAZINE HCL 25 MG/ML IJ SOLN
25.0000 mg | Freq: Once | INTRAMUSCULAR | Status: AC
  Administered 2020-09-04: 25 mg via INTRAMUSCULAR
  Filled 2020-09-04: qty 1

## 2020-09-04 MED ORDER — PROMETHAZINE HCL 25 MG PO TABS: 25.0000 mg | ORAL_TABLET | Freq: Once | ORAL | Status: DC

## 2020-09-04 MED ORDER — PROMETHAZINE HCL 25 MG RE SUPP: 25.0000 mg | Freq: Four times a day (QID) | RECTAL | 0 refills | Status: DC | PRN

## 2020-09-04 NOTE — ED Provider Notes (Signed)
Emergency Medicine Provider Triage Evaluation Note  [name] , a 70 y.o. female  was evaluated in triage.  Pt complains of nausea, vomiting, headache.  This is been going on since a fall about a month ago.  During the fall she sustained a shoulder fracture which was repaired.  She has been taking Zofran at home without improvement.  She reports symptoms are worsening.  Review of Systems  Positive: N/v, HA Negative: Neck or back pain  Physical Exam  BP (!) 159/86   Pulse 67   Temp 98.6 F (37 C) (Oral)   Resp 16   SpO2 100%  Gen:   Awake, no distress   Resp:  Normal effort  MSK:   Moves extremities without difficulty  Other:  EOMI.  No TTP of C-spine.  Medical Decision Making  Medically screening exam initiated at 12:43 PM.  Appropriate orders placed.  NAHLANI GRIGG was informed that the remainder of the evaluation will be completed by another provider, this initial triage assessment does not replace that evaluation, and the importance of remaining in the ED until their evaluation is complete.  Ct head, labs. Offered zofran, pt declined   Franchot Heidelberg, PA-C 09/04/20 1244    Luna Fuse, MD 09/04/20 1836

## 2020-09-04 NOTE — ED Notes (Signed)
Pt refusing PO phenergan. Wants IM or IV. Dr. Almyra Free notified.

## 2020-09-04 NOTE — ED Notes (Signed)
Pt d/c'd via wheelchair. AxO x4. VSS. Pt said her nausea has lessened.

## 2020-09-04 NOTE — ED Triage Notes (Addendum)
Patient arrived by [facility] from home. Patient has been experiencing nausea/vomiting and headache since Wednesday. Patient had recent fall on 6/28 with multiple injuries. Alert and oriented and reports that she has been taking zofran at home with no relief. Patient states that she has had nausea since the fall.

## 2020-09-04 NOTE — Discharge Instructions (Addendum)
Call your primary care doctor or specialist as discussed in the next 2-3 days.   Return immediately back to the ER if:  Your symptoms worsen within the next 12-24 hours. You develop new symptoms such as new fevers, persistent vomiting, new pain, shortness of breath, or new weakness or numbness, or if you have any other concerns.  

## 2020-09-04 NOTE — ED Provider Notes (Signed)
[place] EMERGENCY DEPARTMENT Provider Note   CSN: [id number] Arrival date & time: 09/04/20  1214     History No chief complaint on file.   [name] is a 70 y.o. female.  Patient presents with a chief complaint of nausea and vomiting.  She states that she is been having intermittent vomiting for about 1 month.  This started after a fall that she had which resulted in a fracture of the right shoulder that she had operative repair on.  She is been following up with the doctor who is in prescribing Zofran which does not seem to be helping anymore.  She states she vomits multiple times a day, nonbloody nonbilious vomitus.  Denies any fevers or cough.  Denies any diarrhea.  Denies chest pain.  She requested Phenergan which seemed to work for her but the doctor has not been able to fill it for her so she presents to the ER.        Past Medical History:  Diagnosis Date   Anxiety    Arthritis    CAD (coronary artery disease)    Cataract    Depression    DISORDER, MENOPAUSAL NOS 03/20/2007   Qualifier: Diagnosis of  By: Arnoldo Morale MD, Balinda Quails    GERD (gastroesophageal reflux disease)    Hyperlipidemia    Hypothyroidism    MYOCARDIAL INFARCTION, HX OF 09/09/2007   Qualifier: Diagnosis of  By: Leanne Chang MD, Bruce     Osteoporosis    Polymyalgia rheumatica [facility])     Patient Active Problem List   Diagnosis Date Noted   Acute delirium 08/20/2020   Shoulder fracture 08/10/2020   Fall 08/10/2020   Lumbar compression fracture ([place]) 05/11/2019   Educated about COVID-19 virus infection 01/26/2019   Dyslipidemia 01/26/2019   Drug-induced myopathy 01/14/2019   Haglund's deformity 01/12/2019   Myalgia 12/30/2018   High risk medication use 12/04/2018   Recurrent major depressive disorder, in full remission ([place]) 09/03/2018   Lower esophageal ring ([place]) 03/03/2018   Osteoarthritis of right hip 12/02/2017   GERD (gastroesophageal reflux disease) 12/15/2015   Polyp  of colon, adenomatous 10/11/2014   Vitamin D deficiency 05/26/2014   Osteoporosis 05/26/2014   Polymyalgia rheumatica ([place]) 12/01/2010   CAD (coronary artery disease) 09/09/2007   Hematuria 09/09/2007   Depression 03/20/2007   Hypothyroid 02/13/1999    Past Surgical History:  Procedure Laterality Date   CATARACT EXTRACTION     CESAREAN SECTION     CHOLECYSTECTOMY N/A 11/24/2012   Procedure: LAPAROSCOPIC CHOLECYSTECTOMY;  Surgeon: Ralene Ok, MD;  Location: [place];  Service: General;  Laterality: N/A;   COSMETIC SURGERY     EYE SURGERY     eye lids lifted   PTCA     REVERSE SHOULDER ARTHROPLASTY Right 08/12/2020   Procedure: REVERSE SHOULDER ARTHROPLASTY;  Surgeon: Nicholes Stairs, MD;  Location: [place];  Service: Orthopedics;  Laterality: Right;     OB History   No obstetric history on file.     Family History  Problem Relation Age of Onset   Hypertension Father    Heart disease Father        Mi age 61, 37, 101 and 25.   Cancer Father    Hyperlipidemia Father    Colon cancer Maternal Uncle    Hypertension Mother    Cancer Mother    Hyperlipidemia Mother    Pulmonary fibrosis Sister    Epilepsy Son    Healthy Son  Myasthenia gravis Son    Healthy Son    Rheum arthritis Niece    Lupus Niece    Rheum arthritis Niece     Social History   Tobacco Use   Smoking status: Never   Smokeless tobacco: Never  Vaping Use   Vaping Use: Never used  Substance Use Topics   Alcohol use: No    Alcohol/week: 0.0 standard drinks   Drug use: No    Home Medications Prior to Admission medications   Medication Sig Start Date End Date Taking? Authorizing Provider  promethazine (PHENERGAN) 25 MG suppository Place 1 suppository (25 mg total) rectally every 6 (six) hours as needed for nausea or vomiting. 09/04/20  Yes Kamoria Lucien, Greggory Brandy, MD  acetaminophen (TYLENOL) 500 MG tablet Take 2 tablets (1,000 mg total) by mouth every 6 (six) hours as needed. 08/22/20   Saverio Danker,  PA-C  aspirin 81 MG EC tablet Take 81 mg by mouth at bedtime.    [provider]  cholecalciferol (VITAMIN D3) 25 MCG (1000 UNIT) tablet Take 1,000 Units by mouth at bedtime.    [provider]  levothyroxine (SYNTHROID) 100 MCG tablet TAKE 1 TABLET BY MOUTH DAILY Patient taking differently: Take 100 mcg by mouth at bedtime. 01/06/20   [name], MD  methocarbamol (ROBAXIN) 500 MG tablet Take 2 tablets (1,000 mg total) by mouth every 8 (eight) hours as needed for muscle spasms. 08/22/20   Saverio Danker, PA-C  Oxycodone HCl 10 MG TABS Take 0.5-1 tablets (5-10 mg total) by mouth every 6 (six) hours as needed for moderate pain. 08/22/20   Saverio Danker, PA-C  pantoprazole (PROTONIX) 40 MG tablet Take 1 tablet (40 mg total) by mouth daily. Patient taking differently: Take 40 mg by mouth at bedtime. 02/23/20   [name], MD  PARoxetine (PAXIL) 20 MG tablet TAKE 1 TABLET BY MOUTH EVERY MORNING Patient taking differently: Take 20 mg by mouth at bedtime. 05/17/20   [name], MD  predniSONE (DELTASONE) 5 MG tablet TAKE 1 TABLET BY MOUTH EVERY DAY WITH BREAKFAST Patient taking differently: Take 5 mg by mouth at bedtime. 04/25/20   Ofilia Neas, PA-C  QUEtiapine (SEROQUEL) 25 MG tablet Take 1 tablet (25 mg total) by mouth 2 (two) times daily for 7 days. 08/22/20 08/29/20  Saverio Danker, PA-C  REPATHA SURECLICK [id number] MG/ML SOAJ INJECT 1 PEN SUBCUTANEOUSLY EVERY 14 DAYS Patient taking differently: Inject 140 mg into the skin every 14 (fourteen) days. 01/14/20   Minus Breeding, MD  Teriparatide, Recombinant, 600 MCG/2.4ML SOPN Inject 20 mcg into the skin daily.    [provider]  Vitamin D, Ergocalciferol, (DRISDOL) 1.25 MG (50000 UNIT) CAPS capsule Take 1 capsule (50,000 Units total) by mouth every 7 (seven) days. Patient taking differently: Take 50,000 Units by mouth every Monday. 06/23/20   [name], MD    Allergies    Morphine and related,  Prochlorperazine, Simvastatin, and Sulfa antibiotics  Review of Systems   Review of Systems  Constitutional:  Negative for fever.  HENT:  Negative for ear pain.   Eyes:  Negative for pain.  Respiratory:  Negative for cough.   Cardiovascular:  Negative for chest pain.  Gastrointestinal:  Negative for abdominal pain.  Genitourinary:  Negative for flank pain.  Musculoskeletal:  Negative for back pain.  Skin:  Negative for rash.  Neurological:  Negative for seizures.   Physical Exam Updated Vital Signs BP (!) 167/74   Pulse (!) 59  Temp 98.6 F (37 C) (Oral)   Resp 14   SpO2 99%   Physical Exam Constitutional:      General: She is not in acute distress.    Appearance: Normal appearance.  HENT:     Head: Normocephalic.     Nose: Nose normal.  Eyes:     Extraocular Movements: Extraocular movements intact.  Cardiovascular:     Rate and Rhythm: Normal rate.  Pulmonary:     Effort: Pulmonary effort is normal.  Musculoskeletal:     Cervical back: Normal range of motion.     Right lower leg: No edema.     Left lower leg: No edema.  Neurological:     General: No focal deficit present.     Mental Status: She is alert and oriented to person, place, and time. Mental status is at baseline.     Cranial Nerves: No cranial nerve deficit.     Motor: No weakness.    ED Results / Procedures / Treatments   Labs (all labs ordered are listed, but only abnormal results are displayed) Labs Reviewed  CBC WITH DIFFERENTIAL/PLATELET - Abnormal; Notable for the following components:      Result Value   RBC 3.73 (*)    Hemoglobin 11.5 (*)    MCV 102.4 (*)    All other components within normal limits  COMPREHENSIVE METABOLIC PANEL - Abnormal; Notable for the following components:   Total Protein 6.4 (*)    Albumin 3.2 (*)    Alkaline Phosphatase 154 (*)    All other components within normal limits  LIPASE, BLOOD - Abnormal; Notable for the following components:   Lipase 66 (*)    All  other components within normal limits    EKG EKG Interpretation  Date/Time:  Sunday September 04 2020 18:05:52 EDT Ventricular Rate:  61 PR Interval:  150 QRS Duration: 72 QT Interval:  432 QTC Calculation: 434 R Axis:   0 Text Interpretation: Normal sinus rhythm Cannot rule out Anterior infarct , age undetermined Abnormal ECG Confirmed by Thamas Jaegers ([id number]) on 09/04/2020 6:15:56 PM  Radiology CT Head Wo Contrast  Result Date: 09/04/2020 CLINICAL DATA:  Nausea, vomiting, and headache.  Recent fall. EXAM: CT HEAD WITHOUT CONTRAST TECHNIQUE: Contiguous axial images were obtained from the base of the skull through the vertex without intravenous contrast. COMPARISON:  08/09/2020 FINDINGS: Brain: Small remote left cerebellar infarcts including a 1.3 by 0.8 cm hypodensity in the left posterosuperior cerebellum as well as smaller bilateral lesions. Accentuation of CSF space adjacent to the left tentorium. Stable 1.7 by 0.7 cm hypodensity medially in the right middle cranial fossa for example on image 6 series 3, possibly an arachnoid cyst or epidermoid. Otherwise, the brainstem, cerebellum, cerebral peduncles, thalamus, basal ganglia, basilar cisterns, and ventricular system appear within normal limits. No intracranial hemorrhage, mass lesion, or acute CVA. Vascular: There is atherosclerotic calcification of the cavernous carotid arteries bilaterally. Skull: Unremarkable Sinuses/Orbits: Unremarkable Other: No supplemental non-categorized findings. IMPRESSION: 1. No acute intracranial findings. 2. Unchanged remote cerebellar infarcts. 3. Stable small arachnoid cyst or epidermoid medially in the right middle cranial fossa. Electronically Signed   By: Van Clines M.D.   On: 09/04/2020 13:20    Procedures Procedures   Medications Ordered in ED Medications  promethazine (PHENERGAN) tablet 25 mg (25 mg Oral Patient Refused/Not Given 09/04/20 1837)  promethazine (PHENERGAN) injection 25 mg (25 mg  Intramuscular Given 09/04/20 1845)    ED Course  I have reviewed the triage  vital signs and the nursing notes.  Pertinent labs & imaging results that were available during my care of the patient were reviewed by me and considered in my medical decision making (see chart for details).    MDM Rules/Calculators/A&P                         Urinary tract infection labs are sent and unremarkable white count normal chemistry normal hemoglobin normal as well.  No evidence of acute kidney injury noted.  Patient given Phenergan here with improvement of symptoms.  Given Phenergan suppository to go home with.  Recommended she follow-up with her primary care doctor within the week.  Final Clinical Impression(s) / ED Diagnoses Final diagnoses:  Non-intractable vomiting without nausea, unspecified vomiting type    Rx / DC Orders ED Discharge Orders          Ordered    promethazine (PHENERGAN) 25 MG suppository  Every 6 hours PRN        09/04/20 1904             Luna Fuse, MD 09/04/20 1904

## 2020-09-05 ENCOUNTER — Telehealth: Payer: Self-pay

## 2020-09-05 NOTE — Telephone Encounter (Signed)
Please check on her and make sure she is feeling better

## 2020-09-05 NOTE — Telephone Encounter (Signed)
Nurse Assessment Nurse: Gwendalyn Ege, RN, Clarice Date/Time (Eastern Time): 09/03/2020 10:02:43 PM Confirm and document reason for call. If symptomatic, describe symptoms. ---Caller states she was discharged from the hospital last week after a shoulder surgery. She has had nausea and vomiting for approx 3 days. She is not able to hold anything down except ginger ale. Denies diarrhea or fever. Does the patient have any new or worsening symptoms? ---Yes Will a triage be completed? ---Yes Related visit to physician within the last 2 weeks? ---Yes Does the PT have any chronic conditions? (i.e. diabetes, asthma, this includes High risk factors for pregnancy, etc.) ---No Is this a behavioral health or substance abuse call? ---No Guidelines Guideline Title Affirmed Question Affirmed Notes Nurse Date/Time (Eastern Time) Vomiting Shock suspected (e.g., cold/pale/ clammy skin, too weak to stand, low BP, rapid pulse) Seals, RN, Clarice 09/03/2020 10:05:23 PM PLEASE NOTE: All timestamps contained within this report are represented as [place] Standard Time. CONFIDENTIALTY NOTICE: This fax transmission is intended only for the addressee. It contains information that is legally privileged, confidential or otherwise protected from use or disclosure. If you are not the intended recipient, you are strictly prohibited from reviewing, disclosing, copying using or disseminating any of this information or taking any action in reliance on or regarding this information. If you have received this fax in error, please notify us immediately by telephone so that we can arrange for its return to [place]. Phone: [phone], Toll-Free: [phone], Fax: [phone] Page: 2 of 2 Call Id: [id number] [place]. Time [name] Time) Disposition Final User 09/03/2020 10:18:03 PM Called On-Call Provider [place], RN, Clarice 09/03/2020 10:19:42 PM 911 Outcome Documentation Seals, RN, Clarice Reason: Caller refused  911 disposition. 09/03/2020 10:11:22 PM Call EMS 911 Now Yes Seals, RN, Clarice Caller Disagree/Comply Disagree Caller Understands Yes PreDisposition Call Doctor Care Advice Given Per Guideline CALL EMS 911 NOW: * Immediate medical attention is needed. You need to hang up and call 911 (or an ambulance). * Triager Discretion: I'll call you back in a few minutes to be sure you were able to reach them. CARE ADVICE per Vomiting (Adult) guideline. Comments User: Milderd Meager, RN Date/Time [name] Time): 09/03/2020 10:20:28 PM Advised caller that OCP recommended receiving medical treatment in the ED ASAP. Caller still refused to go to ED. Referrals GO TO FACILITY REFUSED Paging DoctorName Phone DateTime Result/ Outcome Message Type Notes Grier Mitts- MD [phone] 09/03/2020 10:18:03 PM Called On Call Provider - Reached Doctor Paged Grier Mitts- MD 09/03/2020 10:19:08 PM Spoke with On Call - General Message Result Advised that caller needs medical attention and needs to be seen in the ED. No medications for this condition will be called in

## 2020-09-05 NOTE — Telephone Encounter (Signed)
FYI, Pt was seen in ED yesterday 7/24.

## 2020-09-06 DIAGNOSIS — M25511 Pain in right shoulder: Secondary | ICD-10-CM | POA: Diagnosis not present

## 2020-09-06 NOTE — Telephone Encounter (Signed)
Patient states that she is doing better and the ED did help her with everything that she needed.

## 2020-09-13 DIAGNOSIS — M25511 Pain in right shoulder: Secondary | ICD-10-CM | POA: Diagnosis not present

## 2020-09-20 ENCOUNTER — Other Ambulatory Visit: Payer: PPO

## 2020-09-20 DIAGNOSIS — M25511 Pain in right shoulder: Secondary | ICD-10-CM | POA: Diagnosis not present

## 2020-09-26 DIAGNOSIS — Z4789 Encounter for other orthopedic aftercare: Secondary | ICD-10-CM | POA: Diagnosis not present

## 2020-09-27 DIAGNOSIS — M25511 Pain in right shoulder: Secondary | ICD-10-CM | POA: Diagnosis not present

## 2020-10-03 ENCOUNTER — Other Ambulatory Visit: Payer: Self-pay | Admitting: Family Medicine

## 2020-10-03 ENCOUNTER — Other Ambulatory Visit: Payer: Self-pay | Admitting: Physician Assistant

## 2020-10-03 NOTE — Telephone Encounter (Signed)
Next Visit: 11/17/2020  Last Visit: 06/17/2020  Last Fill: 04/25/2020  Dx: Polymyalgia rheumatica   Current Dose per office note on 06/17/2020: Prednisone 5 mg 1 tablet by mouth daily  Okay to refill Prednisone?

## 2020-10-04 DIAGNOSIS — M25511 Pain in right shoulder: Secondary | ICD-10-CM | POA: Diagnosis not present

## 2020-10-11 DIAGNOSIS — M25511 Pain in right shoulder: Secondary | ICD-10-CM | POA: Diagnosis not present

## 2020-10-12 ENCOUNTER — Encounter: Payer: Self-pay | Admitting: Family Medicine

## 2020-10-12 ENCOUNTER — Other Ambulatory Visit: Payer: Self-pay | Admitting: Physician Assistant

## 2020-10-12 NOTE — Telephone Encounter (Signed)
Next Visit: 11/17/2020   Last Visit: 06/17/2020  Dx: Age-related osteoporosis without current pathological fracture   Current Dose per office note on 06/17/2020: Forteo 620 mg per 2.48 mL subcu daily.  Okay to refill Forteo?

## 2020-10-13 ENCOUNTER — Other Ambulatory Visit: Payer: Self-pay

## 2020-10-13 DIAGNOSIS — Z1211 Encounter for screening for malignant neoplasm of colon: Secondary | ICD-10-CM

## 2020-10-18 ENCOUNTER — Encounter: Payer: Self-pay | Admitting: Family Medicine

## 2020-10-18 DIAGNOSIS — M25511 Pain in right shoulder: Secondary | ICD-10-CM | POA: Diagnosis not present

## 2020-10-25 DIAGNOSIS — M25511 Pain in right shoulder: Secondary | ICD-10-CM | POA: Diagnosis not present

## 2020-10-31 DIAGNOSIS — Z1211 Encounter for screening for malignant neoplasm of colon: Secondary | ICD-10-CM | POA: Diagnosis not present

## 2020-11-01 DIAGNOSIS — M25511 Pain in right shoulder: Secondary | ICD-10-CM | POA: Diagnosis not present

## 2020-11-02 DIAGNOSIS — Z789 Other specified health status: Secondary | ICD-10-CM

## 2020-11-02 NOTE — Progress Notes (Signed)
Opened in error

## 2020-11-04 NOTE — Progress Notes (Signed)
Office Visit Note  Patient: [name]             Date of Birth: 1950-05-10           MRN: [id number]             PCP: [name], MD Referring: [name], MD Visit Date: 11/17/2020 Occupation: @GUAROCC @  Subjective:  Medication management.   History of Present Illness: [name] is a 70 y.o. female with a history of polymyalgia rheumatica, osteoarthritis, osteoporosis.  She states she fell in June 2022 and fractured her right humerus.  She underwent right reverse shoulder replacement by Dr. Stann Mainland in July 2022.  She tried physical therapy which did not gain much range of motion.  She states she had a follow-up visit with Dr. Stann Mainland said he did not see any benefit from further physical therapy.  She continues to have discomfort in her right shoulder joint.  None of the other joints are painful.  She takes Forteo injections for osteoporosis.  She states she missed about 3weeks due to fracture.  She had no recurrence of polymyalgia rheumatica.  She denies any muscle weakness or tenderness.  She continues to have some discomfort in her hands, feet and her lower back.  Activities of Daily Living:  Patient reports morning stiffness for 1 hour.   Patient Reports nocturnal pain.  Difficulty dressing/grooming: Reports Difficulty climbing stairs: Denies Difficulty getting out of chair: Denies Difficulty using hands for taps, buttons, cutlery, and/or writing: Denies  Review of Systems  Constitutional:  Negative for fatigue.  HENT:  Negative for mouth dryness.   Eyes:  Negative for dryness.  Respiratory:  Negative for shortness of breath.   Cardiovascular:  Negative for swelling in legs/feet.  Gastrointestinal:  Negative for constipation.  Endocrine: Positive for cold intolerance.  Genitourinary:  Negative for difficulty urinating.  Musculoskeletal:  Positive for joint pain, joint pain, muscle weakness, morning stiffness and muscle tenderness.  Skin:  Negative for  rash.  Allergic/Immunologic: Negative for susceptible to infections.  Neurological:  Negative for numbness.  Hematological:  Negative for bruising/bleeding tendency.  Psychiatric/Behavioral:  Positive for sleep disturbance.    PMFS History:  Patient Active Problem List   Diagnosis Date Noted   Acute delirium 08/20/2020   Shoulder fracture 08/10/2020   Fall 08/10/2020   Lumbar compression fracture ([place]) 05/11/2019   Educated about COVID-19 virus infection 01/26/2019   Dyslipidemia 01/26/2019   Drug-induced myopathy 01/14/2019   Haglund's deformity 01/12/2019   Myalgia 12/30/2018   High risk medication use 12/04/2018   Recurrent major depressive disorder, in full remission ([place]) 09/03/2018   Lower esophageal ring ([place]) 03/03/2018   Osteoarthritis of right hip 12/02/2017   GERD (gastroesophageal reflux disease) 12/15/2015   Polyp of colon, adenomatous 10/11/2014   Vitamin D deficiency 05/26/2014   Osteoporosis 05/26/2014   Polymyalgia rheumatica ([place]) 12/01/2010   CAD (coronary artery disease) 09/09/2007   Hematuria 09/09/2007   Depression 03/20/2007   Hypothyroid 02/13/1999    Past Medical History:  Diagnosis Date   Anxiety    Arthritis    CAD (coronary artery disease)    Cataract    Depression    DISORDER, MENOPAUSAL NOS 03/20/2007   Qualifier: Diagnosis of  By: Arnoldo Morale MD, Balinda Quails    GERD (gastroesophageal reflux disease)    Hyperlipidemia    Hypothyroidism    MYOCARDIAL INFARCTION, HX OF 09/09/2007   Qualifier: Diagnosis of  By: Leanne Chang MD, Darnell Level  Osteoporosis    Polymyalgia rheumatica (HCC)     Family History  Problem Relation Age of Onset   Hypertension Father    Heart disease Father        Mi age 13, 77, 94 and 29.   Cancer Father    Hyperlipidemia Father    Colon cancer Maternal Uncle    Hypertension Mother    Cancer Mother    Hyperlipidemia Mother    Pulmonary fibrosis Sister    Epilepsy Son    Healthy Son    Myasthenia gravis Son    Healthy  Son    Rheum arthritis Niece    Lupus Niece    Rheum arthritis Niece    Past Surgical History:  Procedure Laterality Date   CATARACT EXTRACTION     CESAREAN SECTION     CHOLECYSTECTOMY N/A 11/24/2012   Procedure: LAPAROSCOPIC CHOLECYSTECTOMY;  Surgeon: Ralene Ok, MD;  Location: [facility];  Service: General;  Laterality: N/A;   COSMETIC SURGERY     EYE SURGERY     eye lids lifted   PTCA     REVERSE SHOULDER ARTHROPLASTY Right 08/12/2020   Procedure: REVERSE SHOULDER ARTHROPLASTY;  Surgeon: Nicholes Stairs, MD;  Location: [facility];  Service: Orthopedics;  Laterality: Right;   Social History   Social History Narrative   Husband and 2 adult children live at home. No grandkids. Son with epilepsy.      Retired at age 61    [place]- at [employer].     Immunization History  Administered Date(s) Administered   Fluad Quad(high Dose 65+) 11/10/2019   Influenza Whole 11/09/2008   Influenza, High Dose Seasonal PF 10/30/2016, 12/02/2017, 11/08/2018   Influenza-Unspecified 11/08/2018   PFIZER(Purple Top)SARS-COV-2 Vaccination 02/26/2019, 03/20/2019, 11/28/2019   PNEUMOCOCCAL CONJUGATE-20 06/21/2020   Pneumococcal Conjugate-13 10/11/2014   Pneumococcal Polysaccharide-23 12/15/2015   Td 02/12/2006   Tdap 04/26/2017   Zoster, Live 09/19/2010     Objective: Vital Signs: BP (!) 144/83 (BP Location: Left Arm, Patient Position: Sitting, Cuff Size: Small)   Pulse (!) 49   Resp 12   Ht 5' 1.5" (1.562 m)   Wt 144 lb 12.8 oz (65.7 kg)   BMI 26.92 kg/m    Physical Exam Vitals and nursing note reviewed.  Constitutional:      Appearance: She is well-developed.  HENT:     Head: Normocephalic and atraumatic.  Eyes:     Conjunctiva/sclera: Conjunctivae normal.  Cardiovascular:     Rate and Rhythm: Normal rate and regular rhythm.     Heart sounds: Normal heart sounds.  Pulmonary:     Effort: Pulmonary effort is normal.     Breath sounds: Normal breath sounds.  Abdominal:      General: Bowel sounds are normal.     Palpations: Abdomen is soft.  Musculoskeletal:     Cervical back: Normal range of motion.  Lymphadenopathy:     Cervical: No cervical adenopathy.  Skin:    General: Skin is warm and dry.     Capillary Refill: Capillary refill takes less than 2 seconds.  Neurological:     Mental Status: She is alert and oriented to person, place, and time.  Psychiatric:        Behavior: Behavior normal.     Musculoskeletal Exam: C-spine was in good range of motion.  She had thoracic kyphosis.  Right shoulder joint abduction was limited to 30 degrees.  Elbow joints, wrist joints with good range of motion.  She had bilateral  PIP and DIP thickening.  Hip joints and knee joints with good range of motion.  She had no tenderness over ankles or MTPs.  CDAI Exam: CDAI Score: -- Patient Global: --; Provider Global: -- Swollen: --; Tender: -- Joint Exam 11/17/2020   No joint exam has been documented for this visit   There is currently no information documented on the homunculus. Go to the Rheumatology activity and complete the homunculus joint exam.  Investigation: No additional findings.  Imaging: No results found.  Recent Labs: Lab Results  Component Value Date   WBC 6.7 09/04/2020   HGB 11.5 (L) 09/04/2020   PLT 333 09/04/2020   NA 139 09/04/2020   K 3.5 09/04/2020   CL 107 09/04/2020   CO2 23 09/04/2020   GLUCOSE 91 09/04/2020   BUN 11 09/04/2020   CREATININE 0.85 09/04/2020   BILITOT 0.7 09/04/2020   ALKPHOS 154 (H) 09/04/2020   AST 29 09/04/2020   ALT 23 09/04/2020   PROT 6.4 (L) 09/04/2020   ALBUMIN 3.2 (L) 09/04/2020   CALCIUM 9.3 09/04/2020   GFRAA 75 04/22/2020   QFTBGOLDPLUS NEGATIVE 08/12/2018    Speciality Comments: Forteo October 2021-February 2022 restarted June 2022 (missed 3 weeks due to fracture)  Procedures:  No procedures performed Allergies: Morphine and related, Prochlorperazine, Simvastatin, and Sulfa antibiotics    Assessment / Plan:     Visit Diagnoses: Polymyalgia rheumatica ([place]) - Diagnosed 7 years ago by her previous rheumatologist:   High risk medication use - Patient discontinued methotrexate in April 2022 due to not noticing any clinical benefit.    Long term (current) use of systemic steroids - She continues to take Prednisone 5 mg 1 tablet by mouth daily.  She has tried tapering prednisone in the past and does not want to try tapering it.  She understands the side effects of prednisone.  Age-related osteoporosis without current pathological fracture - Forteo 620 mg per 2.48 mL subcu daily. DEXA updated on June 08, 2019 T score -2.7 in the lumbar region.  She started Forteo in October 2021 which she took until February 2022 and then a headache Until June 2022.  She states she missed Forteo for about a month after the fracture and then restarted.  Need for taking calcium and vitamin D on a regular basis was discussed.  History of compression fracture of spine - Chronic L1 compression fracture with loss of approximately 60% of vertebral body height noted on MRI from 04/22/20.  Recent fall-June 2022.  Advised use of a cane.  Patient was not using a cane today.  She states she is careful to not fall.  I tried to convince her to use a cane.  Status post right total shoulder replacement-August 12, 2020 by Dr. Stann Mainland due to right humerus fracture from a fall in June 2022.  She has limited range of motion of her right shoulder joint with abduction about 30 degrees.  She states she did not get much benefit from physical therapy.  She has been trying to do some exercises at home.  DDD (degenerative disc disease), lumbar - Dr. Lorin Mercy. MRI of the lumbar spine was obtained on 04/23/2019.  She has chronic discomfort.  Vitamin D deficiency-she is on vitamin D supplement.  Trochanteric bursitis, right hip-she continues to have discomfort off and on.  Primary osteoarthritis of right hip-she denies discomfort  today.  Primary osteoarthritis of both hands-she has bilateral PIP and DIP thickening and ongoing pain.  Primary osteoarthritis of both feet-proper fitting  shoes were discussed.  Other medical problems are listed as follows:  History of gastroesophageal reflux (GERD)  History of depression  History of hypothyroidism  Coronary artery disease involving native coronary artery of native heart without angina pectoris  Adenomatous polyp of colon, unspecified part of colon  Orders: No orders of the defined types were placed in this encounter.  No orders of the defined types were placed in this encounter.    Follow-Up Instructions: Return in about 6 months (around 05/18/2021) for Osteoporosis, Osteoarthritis.   Bo Merino, MD  Note - This record has been created using Editor, commissioning.  Chart creation errors have been sought, but may not always  have been located. Such creation errors do not reflect on  the standard of medical care.

## 2020-11-05 LAB — COLOGUARD: Cologuard: NEGATIVE

## 2020-11-08 DIAGNOSIS — M25511 Pain in right shoulder: Secondary | ICD-10-CM | POA: Diagnosis not present

## 2020-11-10 DIAGNOSIS — Z4789 Encounter for other orthopedic aftercare: Secondary | ICD-10-CM | POA: Diagnosis not present

## 2020-11-10 DIAGNOSIS — M25611 Stiffness of right shoulder, not elsewhere classified: Secondary | ICD-10-CM | POA: Diagnosis not present

## 2020-11-17 ENCOUNTER — Encounter: Payer: Self-pay | Admitting: Rheumatology

## 2020-11-17 ENCOUNTER — Other Ambulatory Visit: Payer: Self-pay

## 2020-11-17 ENCOUNTER — Ambulatory Visit: Payer: PPO | Admitting: Rheumatology

## 2020-11-17 VITALS — BP 144/83 | HR 49 | Resp 12 | Ht 61.5 in | Wt 144.8 lb

## 2020-11-17 DIAGNOSIS — Z8781 Personal history of (healed) traumatic fracture: Secondary | ICD-10-CM

## 2020-11-17 DIAGNOSIS — Z96611 Presence of right artificial shoulder joint: Secondary | ICD-10-CM

## 2020-11-17 DIAGNOSIS — M5136 Other intervertebral disc degeneration, lumbar region: Secondary | ICD-10-CM | POA: Diagnosis not present

## 2020-11-17 DIAGNOSIS — Z8639 Personal history of other endocrine, nutritional and metabolic disease: Secondary | ICD-10-CM

## 2020-11-17 DIAGNOSIS — E559 Vitamin D deficiency, unspecified: Secondary | ICD-10-CM

## 2020-11-17 DIAGNOSIS — M51369 Other intervertebral disc degeneration, lumbar region without mention of lumbar back pain or lower extremity pain: Secondary | ICD-10-CM

## 2020-11-17 DIAGNOSIS — M19072 Primary osteoarthritis, left ankle and foot: Secondary | ICD-10-CM

## 2020-11-17 DIAGNOSIS — M353 Polymyalgia rheumatica: Secondary | ICD-10-CM

## 2020-11-17 DIAGNOSIS — D126 Benign neoplasm of colon, unspecified: Secondary | ICD-10-CM

## 2020-11-17 DIAGNOSIS — M1611 Unilateral primary osteoarthritis, right hip: Secondary | ICD-10-CM

## 2020-11-17 DIAGNOSIS — Z7952 Long term (current) use of systemic steroids: Secondary | ICD-10-CM

## 2020-11-17 DIAGNOSIS — M19041 Primary osteoarthritis, right hand: Secondary | ICD-10-CM

## 2020-11-17 DIAGNOSIS — M810 Age-related osteoporosis without current pathological fracture: Secondary | ICD-10-CM | POA: Diagnosis not present

## 2020-11-17 DIAGNOSIS — M19042 Primary osteoarthritis, left hand: Secondary | ICD-10-CM

## 2020-11-17 DIAGNOSIS — I2510 Atherosclerotic heart disease of native coronary artery without angina pectoris: Secondary | ICD-10-CM

## 2020-11-17 DIAGNOSIS — Z79899 Other long term (current) drug therapy: Secondary | ICD-10-CM

## 2020-11-17 DIAGNOSIS — M19071 Primary osteoarthritis, right ankle and foot: Secondary | ICD-10-CM | POA: Diagnosis not present

## 2020-11-17 DIAGNOSIS — M7061 Trochanteric bursitis, right hip: Secondary | ICD-10-CM

## 2020-11-17 DIAGNOSIS — Z8659 Personal history of other mental and behavioral disorders: Secondary | ICD-10-CM

## 2020-11-17 DIAGNOSIS — Z8719 Personal history of other diseases of the digestive system: Secondary | ICD-10-CM

## 2020-11-22 ENCOUNTER — Encounter: Payer: Self-pay | Admitting: Family Medicine

## 2020-11-22 ENCOUNTER — Other Ambulatory Visit: Payer: Self-pay | Admitting: Family Medicine

## 2020-11-22 DIAGNOSIS — E034 Atrophy of thyroid (acquired): Secondary | ICD-10-CM

## 2020-12-05 ENCOUNTER — Other Ambulatory Visit: Payer: Self-pay | Admitting: Physician Assistant

## 2020-12-25 ENCOUNTER — Encounter: Payer: Self-pay | Admitting: Rheumatology

## 2020-12-26 ENCOUNTER — Telehealth: Payer: Self-pay | Admitting: Pharmacist

## 2020-12-26 NOTE — Telephone Encounter (Signed)
Received Forteo PAP re-enrollment application from [facility]   Will place provider portion in Dr. Arlean Hopping folder to be signed (along with med list and insurance card copy)   Mailed patient her portion today to be completed and returned to pharmacy team with letter noting that income docs are required for renewal application     Knox Saliva, PharmD, MPH, BCPS Clinical Pharmacist (Rheumatology and Pulmonology)

## 2020-12-26 NOTE — Telephone Encounter (Signed)
Please schedule an office visit for further evaluation.

## 2020-12-28 NOTE — Telephone Encounter (Signed)
Signed provider portion of [employer] PAP re-enrollment application received for Forteo.  Placed in "PAP pending info" folder in pharmacy office along with insurance card copy, med list, and PA approval letter  Awaiting patient portion to be returned.   Knox Saliva, PharmD, MPH, BCPS Clinical Pharmacist (Rheumatology and Pulmonology)

## 2021-01-06 ENCOUNTER — Other Ambulatory Visit: Payer: Self-pay | Admitting: Family Medicine

## 2021-01-06 ENCOUNTER — Other Ambulatory Visit: Payer: Self-pay | Admitting: Physician Assistant

## 2021-01-09 ENCOUNTER — Telehealth: Payer: Self-pay

## 2021-01-09 NOTE — Telephone Encounter (Signed)
Patient left a voicemail stating she received a call from the pharmacy that Dr. Estanislado Pandy denied her prescription refill of Prednisone.  Patient requested a return call.

## 2021-01-09 NOTE — Telephone Encounter (Signed)
Next Visit: 05/18/2021  Last Visit: 11/17/2020  Last Fill: 10/03/2020  Dx: Polymyalgia rheumatica   Current Dose per office note on 11/17/2020: Prednisone 5 mg 1 tablet by mouth daily  Okay to refill Prednisone?

## 2021-01-09 NOTE — Telephone Encounter (Signed)
Left message to advise patient we have not denied her prescription. Patient advised we are working on the incoming prescription refills at this time and to check with her pharmacy this afternoon.

## 2021-01-12 NOTE — Telephone Encounter (Signed)
Patient dropped off her signed PAP renewal application for Forteo through [employer]. Income documents were not included  Submitted Patient Assistance Application to [employer] for [place] along with provider portion, patient portion, med list, insurance card copy. Will update patient when we receive a response.  Fax# [phone] Phone# [phone]  Knox Saliva, PharmD, MPH, BCPS Clinical Pharmacist (Rheumatology and Pulmonology)

## 2021-01-17 ENCOUNTER — Other Ambulatory Visit: Payer: Self-pay | Admitting: Physician Assistant

## 2021-01-17 NOTE — Telephone Encounter (Signed)
Next Visit: 05/18/2021  Last Visit: 11/17/2020  Last Fill: 10/12/2020  DX: Age-related osteoporosis without current pathological fracture   Current Dose per office note 11/17/2020: Forteo 620 mg per 2.48 mL subcu daily.  Labs: 09/04/2020 Total Protein 6.4, Albumin 3.2, Alk. Phos. 154, RBC 3.73, Hgb 11.5, MCV 102.4  Okay to refill Forteo?

## 2021-01-23 NOTE — Telephone Encounter (Signed)
Received a fax from  [facility] regarding an approval for [place] patient assistance from 02/12/21 to 02/11/22.   Phone number: [phone]  Knox Saliva, PharmD, MPH, BCPS Clinical Pharmacist (Rheumatology and Pulmonology)

## 2021-02-09 DIAGNOSIS — M25511 Pain in right shoulder: Secondary | ICD-10-CM | POA: Diagnosis not present

## 2021-02-27 ENCOUNTER — Telehealth: Payer: Self-pay | Admitting: Pharmacist

## 2021-02-27 ENCOUNTER — Telehealth: Payer: Self-pay | Admitting: Cardiology

## 2021-02-27 ENCOUNTER — Other Ambulatory Visit: Payer: Self-pay | Admitting: Cardiology

## 2021-02-27 ENCOUNTER — Encounter: Payer: Self-pay | Admitting: Cardiology

## 2021-02-27 NOTE — Telephone Encounter (Signed)
°*  STAT* If patient is at the pharmacy, call can be transferred to refill team.   1. Which medications need to be refilled? (please list name of each medication and dose if known) REPATHA SURECLICK 601 MG/ML SOAJ   2. Which pharmacy/location (including street and city if local pharmacy) is medication to be sent to?[facility] - [place], [place] - [address]  Phone:  [phone] Fax:  [phone]  3. Do they need a 30 day or 90 day supply? N/a

## 2021-02-27 NOTE — Telephone Encounter (Signed)
PT is reaching out to start the renewal process for her [place] assistance...Marland Kitchen please advise

## 2021-02-27 NOTE — Telephone Encounter (Signed)
Called and spoke to pt to let them know rx sent and [employer] was approved and provided to the pt.  Pharmacy Card CARD NO. [id number]   CARD STATUS Active   BIN [id number]   PCN [id number]   PC GROUP [id number]   HELP DESK [phone]   PROVIDER PDMI   PROCESSOR PDMI

## 2021-02-27 NOTE — Telephone Encounter (Signed)
Duplicate: Called and spoke to pt to let them know rx sent and [employer] was approved and provided to the pt.  Pharmacy Card CARD NO. [id number]   CARD STATUS Active   BIN [id number]   PCN [id number]   PC GROUP [id number]   HELP DESK [phone]   PROVIDER PDMI   PROCESSOR PDMI

## 2021-03-10 ENCOUNTER — Other Ambulatory Visit: Payer: Self-pay

## 2021-03-10 ENCOUNTER — Ambulatory Visit (INDEPENDENT_AMBULATORY_CARE_PROVIDER_SITE_OTHER): Payer: PPO

## 2021-03-10 DIAGNOSIS — Z0000 Encounter for general adult medical examination without abnormal findings: Secondary | ICD-10-CM

## 2021-03-10 NOTE — Progress Notes (Signed)
Virtual Visit via Telephone Note  I connected with  Juanell Fairly on 03/10/21 at  3:15 PM EST by telephone and verified that I am speaking with the correct person using two identifiers.  Medicare Annual Wellness visit completed telephonically due to Covid-19 pandemic.   Persons participating in this call: This Health Coach and this patient.   Location: Patient: Home Provider: Office    I discussed the limitations, risks, security and privacy concerns of performing an evaluation and management service by telephone and the availability of in person appointments. The patient expressed understanding and agreed to proceed.  Unable to perform video visit due to video visit attempted and failed and/or patient does not have video capability.   Some vital signs may be absent or patient reported.   Willette Brace, LPN   Subjective:   SHAYLA HEMING is a 71 y.o. female who presents for Medicare Annual (Subsequent) preventive examination.  Review of Systems     Cardiac Risk Factors include: advanced age (>63men, >39 women);dyslipidemia     Objective:    There were no vitals filed for this visit. There is no height or weight on file to calculate BMI.  Advanced Directives 03/10/2021 09/04/2020 08/10/2020 08/09/2020 05/27/2019 04/02/2017 07/02/2016  Does Patient Have a Medical Advance Directive? No No No No Yes No Yes  Type of Advance Directive - - - - Living will - Living will  Does patient want to make changes to medical advance directive? No - Patient declined - - - No - Patient declined - -  Would patient like information on creating a medical advance directive? - No - Patient declined Yes (Inpatient - patient requests chaplain consult to create a medical advance directive) - No - Patient declined No - Patient declined -  Pre-existing out of facility DNR order (yellow form or pink MOST form) - - - - - - -    Current Medications (verified) Outpatient Encounter Medications as of 03/10/2021   Medication Sig   acetaminophen (TYLENOL) 500 MG tablet Take 2 tablets (1,000 mg total) by mouth every 6 (six) hours as needed.   aspirin 81 MG EC tablet Take 81 mg by mouth at bedtime.   cholecalciferol (VITAMIN D3) 25 MCG (1000 UNIT) tablet Take 1,000 Units by mouth at bedtime.   FORTEO 600 MCG/2.4ML SOPN INJECT 20MCG UNDER THE SKIN ONCE DAILY   levothyroxine (SYNTHROID) 100 MCG tablet TAKE 1 TABLET BY MOUTH EVERY DAY   methocarbamol (ROBAXIN) 500 MG tablet Take 2 tablets (1,000 mg total) by mouth every 8 (eight) hours as needed for muscle spasms.   pantoprazole (PROTONIX) 40 MG tablet TAKE 1 TABLET BY MOUTH EVERY DAY   PARoxetine (PAXIL) 20 MG tablet TAKE 1 TABLET BY MOUTH EVERY MORNING   predniSONE (DELTASONE) 5 MG tablet TAKE 1 TABLET BY MOUTH EVERY DAY WITH BREAKFAST   promethazine (PHENERGAN) 25 MG suppository Place 1 suppository (25 mg total) rectally every 6 (six) hours as needed for nausea or vomiting.   REPATHA SURECLICK 810 MG/ML SOAJ INJECT 1 PEN SUBCUTANEOUSLY EVERY 14 DAYS   Vitamin D, Ergocalciferol, (DRISDOL) 1.25 MG (50000 UNIT) CAPS capsule Take 1 capsule (50,000 Units total) by mouth every 7 (seven) days.   FLUZONE HIGH-DOSE QUADRIVALENT 0.7 ML SUSY    PFIZER COVID-19 VAC BIVALENT injection    QUEtiapine (SEROQUEL) 25 MG tablet Take 1 tablet (25 mg total) by mouth 2 (two) times daily for 7 days.   [DISCONTINUED] Oxycodone HCl 10 MG TABS Take 0.5-1  tablets (5-10 mg total) by mouth every 6 (six) hours as needed for moderate pain. (Patient not taking: Reported on 11/17/2020)   No facility-administered encounter medications on file as of 03/10/2021.    Allergies (verified) Morphine and related, Prochlorperazine, Simvastatin, and Sulfa antibiotics   History: Past Medical History:  Diagnosis Date   Anxiety    Arthritis    CAD (coronary artery disease)    Cataract    Depression    DISORDER, MENOPAUSAL NOS 03/20/2007   Qualifier: Diagnosis of  By: Arnoldo Morale MD, Balinda Quails    GERD  (gastroesophageal reflux disease)    Hyperlipidemia    Hypothyroidism    MYOCARDIAL INFARCTION, HX OF 09/09/2007   Qualifier: Diagnosis of  By: Leanne Chang MD, Bruce     Osteoporosis    Polymyalgia rheumatica [facility])    Past Surgical History:  Procedure Laterality Date   CATARACT EXTRACTION     CESAREAN SECTION     CHOLECYSTECTOMY N/A 11/24/2012   Procedure: LAPAROSCOPIC CHOLECYSTECTOMY;  Surgeon: Ralene Ok, MD;  Location: [facility];  Service: General;  Laterality: N/A;   COSMETIC SURGERY     EYE SURGERY     eye lids lifted   PTCA     REVERSE SHOULDER ARTHROPLASTY Right 08/12/2020   Procedure: REVERSE SHOULDER ARTHROPLASTY;  Surgeon: Nicholes Stairs, MD;  Location: [facility];  Service: Orthopedics;  Laterality: Right;   Family History  Problem Relation Age of Onset   Hypertension Father    Heart disease Father        Mi age 49, 30, 63 and 7.   Cancer Father    Hyperlipidemia Father    Colon cancer Maternal Uncle    Hypertension Mother    Cancer Mother    Hyperlipidemia Mother    Pulmonary fibrosis Sister    Epilepsy Son    Healthy Son    Myasthenia gravis Son    Healthy Son    Rheum arthritis Niece    Lupus Niece    Rheum arthritis Niece    Social History   Socioeconomic History   Marital status: Married    Spouse name: Not on file   Number of children: Not on file   Years of education: Not on file   Highest education level: Not on file  Occupational History   Not on file  Tobacco Use   Smoking status: Never   Smokeless tobacco: Never  Vaping Use   Vaping Use: Never used  Substance and Sexual Activity   Alcohol use: No    Alcohol/week: 0.0 standard drinks   Drug use: No   Sexual activity: Not on file  Other Topics Concern   Not on file  Social History Narrative   Husband and 2 adult children live at home. No grandkids. Son with epilepsy.      Retired at age 12    [place]- at [employer].     Social Determinants of Health   Financial Resource Strain: Low  Risk    Difficulty of Paying Living Expenses: Not hard at all  Food Insecurity: No Food Insecurity   Worried About Charity fundraiser in the Last Year: Never true   [place] in the Last Year: Never true  Transportation Needs: No Transportation Needs   Lack of Transportation (Medical): No   Lack of Transportation (Non-Medical): No  Physical Activity: Sufficiently Active   Days of Exercise per Week: 7 days   Minutes of Exercise per Session: 30 min  Stress:  No Stress Concern Present   Feeling of Stress : Not at all  Social Connections: Moderately Integrated   Frequency of Communication with Friends and Family: More than three times a week   Frequency of Social Gatherings with Friends and Family: More than three times a week   Attends Religious Services: More than 4 times per year   Active Member of [employer] or Organizations: No   Attends Music therapist: Never   Marital Status: Married    Tobacco Counseling Counseling given: Not Answered   Clinical Intake:  Pre-visit preparation completed: Yes  Pain : No/denies pain     BMI - recorded: 26.92 Diabetes: No  How often do you need to have someone help you when you read instructions, pamphlets, or other written materials from your doctor or pharmacy?: 1 - Never  Diabetic?no  Interpreter Needed?: No  Information entered by :: Charlott Rakes, LPN   Activities of Daily Living In your present state of health, do you have any difficulty performing the following activities: 03/10/2021 08/09/2020  Hearing? N N  Vision? N N  Difficulty concentrating or making decisions? N N  Walking or climbing stairs? Y N  Comment avoid stairs -  Dressing or bathing? Y N  Comment unable to use right arm -  Doing errands, shopping? N N  Preparing Food and eating ? N -  Using the Toilet? N -  In the past six months, have you accidently leaked urine? N -  Do you have problems with loss of bowel control? N -  Managing your  Medications? N -  Managing your Finances? N -  Housekeeping or managing your Housekeeping? N -  Some recent data might be hidden    Patient Care Team: [name], MD as PCP - General (Family Medicine) Franchot Gallo, MD as Consulting Physician (Urology) Bo Merino, MD as Consulting Physician (Rheumatology) Armbruster, Carlota Raspberry, MD as Consulting Physician (Gastroenterology) Marybelle Killings, MD as Consulting Physician (Orthopedic Surgery) Minus Breeding, MD as Consulting Physician (Cardiology)  Indicate any recent Medical Services you may have received from other than [facility] providers in the past year (date may be approximate).     Assessment:   This is a routine wellness examination for Preesha.  Hearing/Vision screen Hearing Screening - Comments:: Pt denies any hearing issues  Vision Screening - Comments:: Pt follows up with Dr Katy Apo for annual eye exams   Dietary issues and exercise activities discussed: Current Exercise Habits: Home exercise routine, Type of exercise: Other - see comments;walking;stretching, Time (Minutes): 30   Goals Addressed             This Visit's Progress    Patient Stated       Pt wants to improve right arm mobility        Depression Screen PHQ 2/9 Scores 03/10/2021 06/21/2020 01/01/2019 09/03/2018 07/29/2017 07/29/2017 01/06/2016  PHQ - 2 Score 0 0 0 0 0 0 0  PHQ- 9 Score - 1 0 1 1 - -    Fall Risk Fall Risk  03/10/2021 06/21/2020 01/01/2019 07/29/2017 01/06/2016  Falls in the past year? 1 1 0 No No  Number falls in past yr: 1 1 0 - -  Injury with Fall? 1 0 0 - -  Comment right shoulder - - - -  Risk for fall due to : Impaired vision - - - -  Follow up Falls prevention discussed - - - -    FALL RISK PREVENTION PERTAINING  TO THE HOME:  Any stairs in or around the home? no If so, are there any without handrails? Yes  Home free of loose throw rugs in walkways, pet beds, electrical cords, etc? Yes  Adequate lighting in  your home to reduce risk of falls? Yes   ASSISTIVE DEVICES UTILIZED TO PREVENT FALLS:  Life alert? No  Use of a cane, walker or w/c? No  Grab bars in the bathroom? No  Shower chair or bench in shower? Yes  Elevated toilet seat or a handicapped toilet? No   TIMED UP AND GO:  Was the test performed? No .  Cognitive Function:     6CIT Screen 03/10/2021  What Year? 0 points  What month? 0 points  What time? 0 points  Count back from 20 0 points  Months in reverse 0 points  Repeat phrase 0 points  Total Score 0    Immunizations Immunization History  Administered Date(s) Administered   Fluad Quad(high Dose 65+) 11/10/2019, 11/21/2020   Influenza Whole 11/09/2008   Influenza, High Dose Seasonal PF 10/30/2016, 12/02/2017, 11/08/2018   Influenza-Unspecified 11/08/2018   PFIZER(Purple Top)SARS-COV-2 Vaccination 02/26/2019, 03/20/2019, 11/28/2019   PNEUMOCOCCAL CONJUGATE-20 06/21/2020   Pfizer Covid-19 Vaccine Bivalent Booster 24yrs & up 11/21/2020   Pneumococcal Conjugate-13 10/11/2014   Pneumococcal Polysaccharide-23 12/15/2015   Td 02/12/2006   Tdap 04/26/2017   Zoster, Live 09/19/2010    TDAP status: Up to date  Flu Vaccine status: Up to date  Pneumococcal vaccine status: Up to date  Covid-19 vaccine status: Completed vaccines  Qualifies for Shingles Vaccine? Yes   Zostavax completed Yes   Shingrix Completed?: No.    Education has been provided regarding the importance of this vaccine. Patient has been advised to call insurance company to determine out of pocket expense if they have not yet received this vaccine. Advised may also receive vaccine at local pharmacy or Health Dept. Verbalized acceptance and understanding.  Screening Tests Health Maintenance  Topic Date Due   Zoster Vaccines- Shingrix (1 of 2) Never done   MAMMOGRAM  08/06/2022   COLONOSCOPY (Pts 45-27yrs Insurance coverage will need to be confirmed)  11/05/2025   TETANUS/TDAP  04/27/2027   Pneumonia  Vaccine 40+ Years old  Completed   INFLUENZA VACCINE  Completed   DEXA SCAN  Completed   COVID-19 Vaccine  Completed   Hepatitis C Screening  Completed   HPV VACCINES  Aged Out    Health Maintenance  Health Maintenance Due  Topic Date Due   Zoster Vaccines- Shingrix (1 of 2) Never done    Colorectal cancer screening: Type of screening: Cologuard. Completed 11/05/20. Repeat every 3 years  Mammogram status: Completed 08/05/20. Repeat every year  Bone Density status: Completed 06/08/19. Results reflect: Bone density results: OSTEOPOROSIS. Repeat every 2 years.  Additional Screening:  Hepatitis C Screening:  Completed 08/12/18  Vision Screening: Recommended annual ophthalmology exams for early detection of glaucoma and other disorders of the eye. Is the patient up to date with their annual eye exam?  Yes  Who is the provider or what is the name of the office in which the patient attends annual eye exams? Dr Katy Apo  If pt is not established with a provider, would they like to be referred to a provider to establish care? No .   Dental Screening: Recommended annual dental exams for proper oral hygiene  Community Resource Referral / Chronic Care Management: CRR required this visit?  No   CCM required this visit?  No      Plan:     I have personally reviewed and noted the following in the patients chart:   Medical and social history Use of alcohol, tobacco or illicit drugs  Current medications and supplements including opioid prescriptions.  Functional ability and status Nutritional status Physical activity Advanced directives List of other physicians Hospitalizations, surgeries, and ER visits in previous 12 months Vitals Screenings to include cognitive, depression, and falls Referrals and appointments  In addition, I have reviewed and discussed with patient certain preventive protocols, quality metrics, and best practice recommendations. A written personalized care  plan for preventive services as well as general preventive health recommendations were provided to patient.     Willette Brace, LPN   [id number]   Nurse Notes: None

## 2021-03-10 NOTE — Patient Instructions (Signed)
[name] , Thank you for taking time to come for your Medicare Wellness Visit. I appreciate your ongoing commitment to your health goals. Please review the following plan we discussed and let me know if I can assist you in the future.   Screening recommendations/referrals: Colonoscopy: Done cologuard 11/05/20 repeat every 3 years  Mammogram: Done 08/05/20 repeat every year  Bone Density: Done 4/26/*21 repeat every 2 years  Recommended yearly ophthalmology/optometry visit for glaucoma screening and checkup Recommended yearly dental visit for hygiene and checkup  Vaccinations: Influenza vaccine: Done 11/21/20 repeat every year  Pneumococcal vaccine: Up to date Tdap vaccine: Completed 04/26/17 repeat every 10 years  Shingles vaccine: Shingrix, Please contact your pharmacy for coverage information.    Covid-19:Completed 1/14, 2/5, 11/28/19 & 11/21/20   Advanced directives: Advance directive discussed with you today. Even though you declined this today please call our office should you change your mind and we can give you the proper paperwork for you to fill out.  Conditions/risks identified: to improve right arm mobility   Next appointment: Follow up in one year for your annual wellness visit    Preventive Care 65 Years and Older, Female Preventive care refers to lifestyle choices and visits with your health care provider that can promote health and wellness. What does preventive care include? A yearly physical exam. This is also called an annual well check. Dental exams once or twice a year. Routine eye exams. Ask your health care provider how often you should have your eyes checked. Personal lifestyle choices, including: Daily care of your teeth and gums. Regular physical activity. Eating a healthy diet. Avoiding tobacco and drug use. Limiting alcohol use. Practicing safe sex. Taking low-dose aspirin every day. Taking vitamin and mineral supplements as recommended by your health  care provider. What happens during an annual well check? The services and screenings done by your health care provider during your annual well check will depend on your age, overall health, lifestyle risk factors, and family history of disease. Counseling  Your health care provider may ask you questions about your: Alcohol use. Tobacco use. Drug use. Emotional well-being. Home and relationship well-being. Sexual activity. Eating habits. History of falls. Memory and ability to understand (cognition). Work and work Statistician. Reproductive health. Screening  You may have the following tests or measurements: Height, weight, and BMI. Blood pressure. Lipid and cholesterol levels. These may be checked every 5 years, or more frequently if you are over 5 years old. Skin check. Lung cancer screening. You may have this screening every year starting at age 47 if you have a 30-pack-year history of smoking and currently smoke or have quit within the past 15 years. Fecal occult blood test (FOBT) of the stool. You may have this test every year starting at age 36. Flexible sigmoidoscopy or colonoscopy. You may have a sigmoidoscopy every 5 years or a colonoscopy every 10 years starting at age 39. Hepatitis C blood test. Hepatitis B blood test. Sexually transmitted disease (STD) testing. Diabetes screening. This is done by checking your blood sugar (glucose) after you have not eaten for a while (fasting). You may have this done every 1-3 years. Bone density scan. This is done to screen for osteoporosis. You may have this done starting at age 45. Mammogram. This may be done every 1-2 years. Talk to your health care provider about how often you should have regular mammograms. Talk with your health care provider about your test results, treatment options, and if necessary, the need  for more tests. Vaccines  Your health care provider may recommend certain vaccines, such as: Influenza vaccine. This is  recommended every year. Tetanus, diphtheria, and acellular pertussis (Tdap, Td) vaccine. You may need a Td booster every 10 years. Zoster vaccine. You may need this after age 76. Pneumococcal 13-valent conjugate (PCV13) vaccine. One dose is recommended after age 67. Pneumococcal polysaccharide (PPSV23) vaccine. One dose is recommended after age 24. Talk to your health care provider about which screenings and vaccines you need and how often you need them. This information is not intended to replace advice given to you by your health care provider. Make sure you discuss any questions you have with your health care provider. Document Released: 02/25/2015 Document Revised: 10/19/2015 Document Reviewed: 11/30/2014 Elsevier Interactive Patient Education  2017 [place] Prevention in the Home Falls can cause injuries. They can happen to people of all ages. There are many things you can do to make your home safe and to help prevent falls. What can I do on the outside of my home? Regularly fix the edges of walkways and driveways and fix any cracks. Remove anything that might make you trip as you walk through a door, such as a raised step or threshold. Trim any bushes or trees on the path to your home. Use bright outdoor lighting. Clear any walking paths of anything that might make someone trip, such as rocks or tools. Regularly check to see if handrails are loose or broken. Make sure that both sides of any steps have handrails. Any raised decks and porches should have guardrails on the edges. Have any leaves, snow, or ice cleared regularly. Use sand or salt on walking paths during winter. Clean up any spills in your garage right away. This includes oil or grease spills. What can I do in the bathroom? Use night lights. Install grab bars by the toilet and in the tub and shower. Do not use towel bars as grab bars. Use non-skid mats or decals in the tub or shower. If you need to sit down in  the shower, use a plastic, non-slip stool. Keep the floor dry. Clean up any water that spills on the floor as soon as it happens. Remove soap buildup in the tub or shower regularly. Attach bath mats securely with double-sided non-slip rug tape. Do not have throw rugs and other things on the floor that can make you trip. What can I do in the bedroom? Use night lights. Make sure that you have a light by your bed that is easy to reach. Do not use any sheets or blankets that are too big for your bed. They should not hang down onto the floor. Have a firm chair that has side arms. You can use this for support while you get dressed. Do not have throw rugs and other things on the floor that can make you trip. What can I do in the kitchen? Clean up any spills right away. Avoid walking on wet floors. Keep items that you use a lot in easy-to-reach places. If you need to reach something above you, use a strong step stool that has a grab bar. Keep electrical cords out of the way. Do not use floor polish or wax that makes floors slippery. If you must use wax, use non-skid floor wax. Do not have throw rugs and other things on the floor that can make you trip. What can I do with my stairs? Do not leave any items on the stairs.  Make sure that there are handrails on both sides of the stairs and use them. Fix handrails that are broken or loose. Make sure that handrails are as long as the stairways. Check any carpeting to make sure that it is firmly attached to the stairs. Fix any carpet that is loose or worn. Avoid having throw rugs at the top or bottom of the stairs. If you do have throw rugs, attach them to the floor with carpet tape. Make sure that you have a light switch at the top of the stairs and the bottom of the stairs. If you do not have them, ask someone to add them for you. What else can I do to help prevent falls? Wear shoes that: Do not have high heels. Have rubber bottoms. Are comfortable  and fit you well. Are closed at the toe. Do not wear sandals. If you use a stepladder: Make sure that it is fully opened. Do not climb a closed stepladder. Make sure that both sides of the stepladder are locked into place. Ask someone to hold it for you, if possible. Clearly mark and make sure that you can see: Any grab bars or handrails. First and last steps. Where the edge of each step is. Use tools that help you move around (mobility aids) if they are needed. These include: Canes. Walkers. Scooters. Crutches. Turn on the lights when you go into a dark area. Replace any light bulbs as soon as they burn out. Set up your furniture so you have a clear path. Avoid moving your furniture around. If any of your floors are uneven, fix them. If there are any pets around you, be aware of where they are. Review your medicines with your doctor. Some medicines can make you feel dizzy. This can increase your chance of falling. Ask your doctor what other things that you can do to help prevent falls. This information is not intended to replace advice given to you by your health care provider. Make sure you discuss any questions you have with your health care provider. Document Released: 11/25/2008 Document Revised: 07/07/2015 Document Reviewed: 03/05/2014 Elsevier Interactive Patient Education  2017 [employer].

## 2021-03-13 ENCOUNTER — Other Ambulatory Visit: Payer: Self-pay | Admitting: Rheumatology

## 2021-03-13 MED ORDER — PREDNISONE 5 MG PO TABS: 5.0000 mg | ORAL_TABLET | Freq: Every day | ORAL | 0 refills | Status: DC

## 2021-03-13 NOTE — Telephone Encounter (Signed)
Patient called the office requesting a refill of Prednisone to be sent to [facility] on [address]. Patient requested a 90 day supply if possible.

## 2021-03-13 NOTE — Telephone Encounter (Signed)
Next Visit: 05/18/2021   Last Visit: 11/17/2020   Last Fill: 01/09/2021   Dx: Polymyalgia rheumatica    Current Dose per office note on 11/17/2020: Prednisone 5 mg 1 tablet by mouth daily   Okay to refill Prednisone?

## 2021-04-09 NOTE — Progress Notes (Signed)
Cardiology Office Note   Date:  04/11/2021   ID:  [name], DOB 1950/09/13, MRN [id number]  PCP:  [name], MD  Cardiologist:   Minus Breeding, MD  Referring:  [name], MD   Chief Complaint  Patient presents with   Chest Pain     History of Present Illness: [name] is a 71 y.o. female who presents for follow-up of coronary disease. He's been several years since she was seen. She had a marginal branch myocardial infarction in 2009. There was a long 99% stenosis which was reduced to 50% stenosis with dissection and TIMI-3 flow. She was managed medically this and has done well.   Since I last saw her she had a fractured humerus.  She was in the hospital and I did review these records.  She was very concerned because she had acute psychosis and a complicated hospitalization because of this.  It is a reaction to anesthesia she thought.  She cares for her son who has epilepsy.  He is 31 and he is living independently at his insistence that this really worries her.  When she gets concerned and stressed she gets chest discomfort.  She describes a mid sharp substernal discomfort.  She thinks it similar to her previous angina and may be a little more intense in 2018 when she had her last negative POET (Plain Old Exercise Treadmill)     She can walk her dog without bringing on any symptoms.  She does not describe associated nausea vomiting or diaphoresis.  She does not have any new shortness of breath, PND or orthopnea.  He has no palpitations, presyncope or syncope.   Past Medical History:  Diagnosis Date   Anxiety    Arthritis    CAD (coronary artery disease)    Cataract    Depression    DISORDER, MENOPAUSAL NOS 03/20/2007   Qualifier: Diagnosis of  By: Arnoldo Morale MD, Balinda Quails    GERD (gastroesophageal reflux disease)    Hyperlipidemia    Hypothyroidism    MYOCARDIAL INFARCTION, HX OF 09/09/2007   Qualifier: Diagnosis of  By: Leanne Chang MD, Bruce     Osteoporosis     Polymyalgia rheumatica [facility])     Past Surgical History:  Procedure Laterality Date   CATARACT EXTRACTION     CESAREAN SECTION     CHOLECYSTECTOMY N/A 11/24/2012   Procedure: LAPAROSCOPIC CHOLECYSTECTOMY;  Surgeon: Ralene Ok, MD;  Location: [place];  Service: General;  Laterality: N/A;   COSMETIC SURGERY     EYE SURGERY     eye lids lifted   PTCA     REVERSE SHOULDER ARTHROPLASTY Right 08/12/2020   Procedure: REVERSE SHOULDER ARTHROPLASTY;  Surgeon: Nicholes Stairs, MD;  Location: [place];  Service: Orthopedics;  Laterality: Right;     Current Outpatient Medications  Medication Sig Dispense Refill   acetaminophen (TYLENOL) 500 MG tablet Take 2 tablets (1,000 mg total) by mouth every 6 (six) hours as needed. 30 tablet 0   aspirin 81 MG EC tablet Take 81 mg by mouth at bedtime.     cholecalciferol (VITAMIN D3) 25 MCG (1000 UNIT) tablet Take 1,000 Units by mouth at bedtime.     FLUZONE HIGH-DOSE QUADRIVALENT 0.7 ML SUSY      FORTEO 600 MCG/2.4ML SOPN INJECT 20MCG UNDER THE SKIN ONCE DAILY 9.6 mL 0   levothyroxine (SYNTHROID) 100 MCG tablet TAKE 1 TABLET BY MOUTH EVERY DAY 90 tablet 3   methocarbamol (ROBAXIN)  500 MG tablet Take 2 tablets (1,000 mg total) by mouth every 8 (eight) hours as needed for muscle spasms. 45 tablet 0   pantoprazole (PROTONIX) 40 MG tablet TAKE 1 TABLET BY MOUTH EVERY DAY 90 tablet 0   PARoxetine (PAXIL) 20 MG tablet TAKE 1 TABLET BY MOUTH EVERY MORNING 90 tablet 0   PFIZER COVID-19 VAC BIVALENT injection      predniSONE (DELTASONE) 5 MG tablet Take 1 tablet (5 mg total) by mouth daily with breakfast. 90 tablet 0   promethazine (PHENERGAN) 25 MG suppository Place 1 suppository (25 mg total) rectally every 6 (six) hours as needed for nausea or vomiting. 12 each 0   REPATHA SURECLICK 465 MG/ML SOAJ INJECT 1 PEN SUBCUTANEOUSLY EVERY 14 DAYS 2 mL 11   Vitamin D, Ergocalciferol, (DRISDOL) 1.25 MG (50000 UNIT) CAPS capsule Take 1 capsule (50,000 Units total) by  mouth every 7 (seven) days. 12 capsule 0   QUEtiapine (SEROQUEL) 25 MG tablet Take 1 tablet (25 mg total) by mouth 2 (two) times daily for 7 days. 14 tablet 0   No current facility-administered medications for this visit.    Allergies:   Morphine and related, Prochlorperazine, Simvastatin, and Sulfa antibiotics    ROS:  Please see the history of present illness.   Otherwise, review of systems are positive for none.   All other systems are reviewed and negative.    PHYSICAL EXAM: VS:  BP 116/78 (BP Location: Left Arm, Patient Position: Sitting, Cuff Size: Normal)    Pulse 82    Ht 5' 1.5" (1.562 m)    Wt 143 lb (64.9 kg)    BMI 26.58 kg/m  , BMI Body mass index is 26.58 kg/m. GENERAL:  Well appearing NECK:  No jugular venous distention, waveform within normal limits, carotid upstroke brisk and symmetric, no bruits, no thyromegaly LUNGS:  Clear to auscultation bilaterally CHEST:  Unremarkable HEART:  PMI not displaced or sustained,S1 and S2 within normal limits, no S3, no S4, no clicks, no rubs, no murmurs ABD:  Flat, positive bowel sounds normal in frequency in pitch, no bruits, no rebound, no guarding, no midline pulsatile mass, no hepatomegaly, no splenomegaly EXT:  2 plus pulses throughout, no edema, no cyanosis no clubbing   EKG:  EKG is  ordered today. The ekg ordered today demonstrates sinus rhythm, rate 82, left axis deviation, low voltage in the chest leads, poor anterior R wave progression, nonspecific T-wave flattening.    Recent Labs: 08/20/2020: TSH 10.140 09/04/2020: ALT 23; BUN 11; Creatinine, Ser 0.85; Hemoglobin 11.5; Platelets 333; Potassium 3.5; Sodium 139    Lipid Panel    Component Value Date/Time   CHOL 187 06/21/2020 1520   CHOL 266 (H) 01/01/2019 1010   TRIG 58.0 06/21/2020 1520   HDL 69.90 06/21/2020 1520   HDL 82 01/01/2019 1010   CHOLHDL 3 06/21/2020 1520   VLDL 11.6 06/21/2020 1520   LDLCALC 105 (H) 06/21/2020 1520   LDLCALC 82 01/12/2020 0834    LDLDIRECT 176.0 12/15/2015 1103      Wt Readings from Last 3 Encounters:  04/11/21 143 lb (64.9 kg)  11/17/20 144 lb 12.8 oz (65.7 kg)  08/09/20 160 lb (72.6 kg)      Other studies Reviewed: Additional studies/ records that were reviewed today include: Hospital records Review of the above records demonstrates:  See elsewhere   ASSESSMENT AND PLAN:   CAD: Given her chest discomfort I would like to bring her back and screen her with a  POET (Plain Old Exercise Treadmill)   DYSLIPIDEMIA:  She is intolerant of statins.  She is on Repatha.    Current medicines are reviewed at length with the patient today.  The patient does not have concerns regarding medicines.  The following changes have been made:  None  Labs/ tests ordered today include:  None  Orders Placed This Encounter  Procedures   Exercise Tolerance Test   EKG 12-Lead     Disposition:   FU with me in 12 months.    Signed, Minus Breeding, MD  04/11/2021 3:00 PM    [place]

## 2021-04-11 ENCOUNTER — Ambulatory Visit: Payer: PPO | Admitting: Cardiology

## 2021-04-11 ENCOUNTER — Encounter: Payer: Self-pay | Admitting: Cardiology

## 2021-04-11 ENCOUNTER — Other Ambulatory Visit: Payer: Self-pay

## 2021-04-11 VITALS — BP 116/78 | HR 82 | Ht 61.5 in | Wt 143.0 lb

## 2021-04-11 DIAGNOSIS — E785 Hyperlipidemia, unspecified: Secondary | ICD-10-CM | POA: Diagnosis not present

## 2021-04-11 DIAGNOSIS — I2510 Atherosclerotic heart disease of native coronary artery without angina pectoris: Secondary | ICD-10-CM | POA: Diagnosis not present

## 2021-04-11 NOTE — Patient Instructions (Addendum)
Medication Instructions:  No changes *If you need a refill on your cardiac medications before your next appointment, please call your pharmacy*   Lab Work: None ordered If you have labs (blood work) drawn today and your tests are completely normal, you will receive your results only by: [place] (if you have MyChart) OR A paper copy in the mail If you have any lab test that is abnormal or we need to change your treatment, we will call you to review the results.   Testing/Procedures: Your physician has requested that you have an exercise tolerance test. For further information please visit [email or web address]. Please also follow instruction sheet, as given. This will take place at [place], [address]. Do not drink or eat foods with caffeine for 24 hours before the test. (Chocolate, coffee, tea, or energy drinks) If you use an inhaler, bring it with you to the test. Do not smoke for 4 hours before the test. Wear comfortable shoes and clothing.   Follow-Up: At [facility], you and your health needs are our priority.  As part of our continuing mission to provide you with exceptional heart care, we have created designated Provider Care Teams.  These Care Teams include your primary Cardiologist (physician) and Advanced Practice Providers (APPs -  Physician Assistants and Nurse Practitioners) who all work together to provide you with the care you need, when you need it.  We recommend signing up for the patient portal called "MyChart".  Sign up information is provided on this After Visit Summary.  MyChart is used to connect with patients for Virtual Visits (Telemedicine).  Patients are able to view lab/test results, encounter notes, upcoming appointments, etc.  Non-urgent messages can be sent to your provider as well.   To learn more about what you can do with MyChart, go to [email or web address].    Your next appointment:   12 month(s)  The format for your next  appointment:   In Person  Provider:   Dr. Shanon Rosser

## 2021-04-12 ENCOUNTER — Telehealth (HOSPITAL_COMMUNITY): Payer: Self-pay | Admitting: *Deleted

## 2021-04-12 NOTE — Telephone Encounter (Signed)
Close encounter 

## 2021-04-13 ENCOUNTER — Ambulatory Visit (HOSPITAL_COMMUNITY)
Admission: RE | Admit: 2021-04-13 | Discharge: 2021-04-13 | Disposition: A | Discharge status: Home / Self Care | Payer: PPO | Source: Ambulatory Visit | Attending: Cardiology | Admitting: Cardiology

## 2021-04-13 ENCOUNTER — Other Ambulatory Visit: Payer: Self-pay

## 2021-04-13 DIAGNOSIS — I2510 Atherosclerotic heart disease of native coronary artery without angina pectoris: Secondary | ICD-10-CM | POA: Diagnosis not present

## 2021-04-13 LAB — EXERCISE TOLERANCE TEST
Estimated workload: 4.6
Exercise duration (min): 3 min
Exercise duration (sec): 0 s
MPHR: 149 {beats}/min
Peak HR: 105 {beats}/min
Percent HR: 70 %
Rest HR: 75 {beats}/min
ST Depression (mm): 0 mm

## 2021-04-14 ENCOUNTER — Other Ambulatory Visit: Payer: Self-pay | Admitting: Family Medicine

## 2021-04-17 DIAGNOSIS — F419 Anxiety disorder, unspecified: Secondary | ICD-10-CM | POA: Diagnosis not present

## 2021-04-17 DIAGNOSIS — E039 Hypothyroidism, unspecified: Secondary | ICD-10-CM | POA: Diagnosis not present

## 2021-04-17 DIAGNOSIS — K219 Gastro-esophageal reflux disease without esophagitis: Secondary | ICD-10-CM | POA: Diagnosis not present

## 2021-04-17 DIAGNOSIS — E663 Overweight: Secondary | ICD-10-CM | POA: Diagnosis not present

## 2021-04-17 DIAGNOSIS — Z7982 Long term (current) use of aspirin: Secondary | ICD-10-CM | POA: Diagnosis not present

## 2021-04-17 DIAGNOSIS — M810 Age-related osteoporosis without current pathological fracture: Secondary | ICD-10-CM | POA: Diagnosis not present

## 2021-04-17 DIAGNOSIS — M1990 Unspecified osteoarthritis, unspecified site: Secondary | ICD-10-CM | POA: Diagnosis not present

## 2021-04-17 DIAGNOSIS — I10 Essential (primary) hypertension: Secondary | ICD-10-CM | POA: Diagnosis not present

## 2021-04-17 DIAGNOSIS — M069 Rheumatoid arthritis, unspecified: Secondary | ICD-10-CM | POA: Diagnosis not present

## 2021-04-17 DIAGNOSIS — E785 Hyperlipidemia, unspecified: Secondary | ICD-10-CM | POA: Diagnosis not present

## 2021-05-04 NOTE — Progress Notes (Signed)
? ?Office Visit Note ? ?Patient: [name]             ?Date of Birth: [birth year]           ?MRN: [id number]             ?PCP: [name], MD ?Referring: [name], MD ?Visit Date: 05/18/2021 ?Occupation: '@GUAROCC'$ @ ? ?Subjective:  ?Lower back pain  ? ?History of Present Illness: [name] is a 71 y.o. female with history polymyalgia rheumatica, osteoporosis, and osteoarthritis.   She remains on prednisone 5 mg daily.  She states that several weeks ago she felt like she was having a PMR flare due to experiencing increased pain in both hips.  Her symptoms have resolved.  She is not experiencing any increased pain in her shoulders or hips at this time.  She does experience some intermittent discomfort in her right shoulder which has been replaced by Dr. Stann Mainland.  Over the past 3 months she has had increased discomfort in both hands and has noticed intermittent joint swelling.  She has been taking ibuprofen as needed which has alleviated her symptoms.  She continues to have chronic pain in her lower back.  She denies any radiating discomfort.  She states that her pain is worse with activity such as vacuuming or loading the washer and dryer.  Her symptoms are alleviated by taking ibuprofen, using a heating pad, and resting.  She has not been evaluated by Dr. Lorin Mercy recently. ?She denies any recent falls or fractures.  She remains on Forteo daily injections.  She plans on having her bone density updated with her next mammogram. ? ? ? ?Activities of Daily Living:  ?Patient reports morning stiffness for 1 hour.   ?Patient Reports nocturnal pain.  ?Difficulty dressing/grooming: Denies ?Difficulty climbing stairs: Reports ?Difficulty getting out of chair: Reports ?Difficulty using hands for taps, buttons, cutlery, and/or writing: Denies ? ?Review of Systems  ?Constitutional:  Positive for fatigue.  ?HENT:  Negative for mouth sores, mouth dryness and nose dryness.   ?Eyes:  Negative for pain, itching and  dryness.  ?Respiratory:  Negative for shortness of breath and difficulty breathing.   ?Cardiovascular:  Negative for chest pain and palpitations.  ?Gastrointestinal:  Negative for blood in stool, constipation and diarrhea.  ?Endocrine: Negative for increased urination.  ?Genitourinary:  Negative for difficulty urinating.  ?Musculoskeletal:  Positive for joint pain, joint pain, myalgias, morning stiffness and myalgias. Negative for joint swelling and muscle tenderness.  ?Skin:  Negative for color change, rash and redness.  ?Allergic/Immunologic: Negative for susceptible to infections.  ?Neurological:  Positive for weakness. Negative for dizziness, numbness, headaches and memory loss.  ?Hematological:  Positive for bruising/bleeding tendency.  ?Psychiatric/Behavioral:  Negative for confusion.   ? ?PMFS History:  ?Patient Active Problem List  ? Diagnosis Date Noted  ?? Acute delirium 08/20/2020  ?? Shoulder fracture 08/10/2020  ?? Fall 08/10/2020  ?? Lumbar compression fracture ([place]) 05/11/2019  ?? Educated about COVID-19 virus infection 01/26/2019  ?? Dyslipidemia 01/26/2019  ?? Drug-induced myopathy 01/14/2019  ?? Haglund's deformity 01/12/2019  ?? Myalgia 12/30/2018  ?? High risk medication use 12/04/2018  ?? Recurrent major depressive disorder, in full remission ([place]) 09/03/2018  ?? Lower esophageal ring ([place]) 03/03/2018  ?? Osteoarthritis of right hip 12/02/2017  ?? GERD (gastroesophageal reflux disease) 12/15/2015  ?? Polyp of colon, adenomatous 10/11/2014  ?? Vitamin D deficiency 05/26/2014  ?? Osteoporosis 05/26/2014  ?? Polymyalgia rheumatica ([place]) 12/01/2010  ?? CAD (coronary artery  disease) 09/09/2007  ?? Hematuria 09/09/2007  ?? Depression 03/20/2007  ?? Hypothyroid 02/13/1999  ?  ?Past Medical History:  ?Diagnosis Date  ?? Anxiety   ?? Arthritis   ?? CAD (coronary artery disease)   ?? Cataract   ?? Depression   ?? DISORDER, MENOPAUSAL NOS 03/20/2007  ? Qualifier: Diagnosis of  By: Arnoldo Morale MD, Balinda Quails    ?? GERD (gastroesophageal reflux disease)   ?? Hyperlipidemia   ?? Hypothyroidism   ?? MYOCARDIAL INFARCTION, HX OF 09/09/2007  ? Qualifier: Diagnosis of  By: Leanne Chang MD, Bruce    ?? Osteoporosis   ?? Polymyalgia rheumatica ([place])   ?  ?Family History  ?Problem Relation Age of Onset  ?? Hypertension Father   ?? Heart disease Father   ?     Mi age 57, 15, 14 and 26.  ?? Cancer Father   ?? Hyperlipidemia Father   ?? Colon cancer Maternal Uncle   ?? Hypertension Mother   ?? Cancer Mother   ?? Hyperlipidemia Mother   ?? Pulmonary fibrosis Sister   ?? Epilepsy Son   ?? Healthy Son   ?? Myasthenia gravis Son   ?? Healthy Son   ?? Rheum arthritis Niece   ?? Lupus Niece   ?? Rheum arthritis Niece   ? ?Past Surgical History:  ?Procedure Laterality Date  ?? CATARACT EXTRACTION    ?? CESAREAN SECTION    ?? CHOLECYSTECTOMY N/A 11/24/2012  ? Procedure: LAPAROSCOPIC CHOLECYSTECTOMY;  Surgeon: Ralene Ok, MD;  Location: [facility];  Service: General;  Laterality: N/A;  ?? COSMETIC SURGERY    ?? EYE SURGERY    ? eye lids lifted  ?? PTCA    ?? REVERSE SHOULDER ARTHROPLASTY Right 08/12/2020  ? Procedure: REVERSE SHOULDER ARTHROPLASTY;  Surgeon: Nicholes Stairs, MD;  Location: [place];  Service: Orthopedics;  Laterality: Right;  ? ?Social History  ? ?Social History Narrative  ? Husband and 2 adult children live at home. No grandkids. Son with epilepsy.  ?   ? Retired at age 12  ?  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    ? ?Immunization History  ?Administered Date(s) Administered  ?? Fluad Quad(high Dose 65+) 11/10/2019, 11/21/2020  ?? Influenza Whole 11/09/2008  ?? Influenza, High Dose Seasonal PF 10/30/2016, 12/02/2017, 11/08/2018  ?? Influenza-Unspecified 11/08/2018  ?? PFIZER(Purple Top)SARS-COV-2 Vaccination 02/26/2019, 03/20/2019, 11/28/2019  ?? PNEUMOCOCCAL CONJUGATE-20 06/21/2020  ?? Pension scheme manager 13yr & up 11/21/2020  ?? Pneumococcal Conjugate-13 10/11/2014  ?? Pneumococcal Polysaccharide-23 12/15/2015  ?? Td  02/12/2006  ?? Tdap 04/26/2017  ?? Zoster, Live 09/19/2010  ?  ? ?Objective: ?Vital Signs: BP 128/84 (BP Location: Left Arm, Patient Position: Sitting, Cuff Size: Normal)   Pulse 75   Ht '5\' 2"'$  (1.575 m)   Wt 156 lb 3.2 oz (70.9 kg)   BMI 28.57 kg/m?   ? ?Physical Exam ?Vitals and nursing note reviewed.  ?Constitutional:   ?   Appearance: She is well-developed.  ?HENT:  ?   Head: Normocephalic and atraumatic.  ?Eyes:  ?   Conjunctiva/sclera: Conjunctivae normal.  ?Cardiovascular:  ?   Rate and Rhythm: Normal rate and regular rhythm.  ?   Heart sounds: Normal heart sounds.  ?Pulmonary:  ?   Effort: Pulmonary effort is normal.  ?   Breath sounds: Normal breath sounds.  ?Abdominal:  ?   General: Bowel sounds are normal.  ?   Palpations: Abdomen is soft.  ?Musculoskeletal:  ?   Cervical back: Normal range of motion.  ?  Skin: ?   General: Skin is warm and dry.  ?   Capillary Refill: Capillary refill takes less than 2 seconds.  ?Neurological:  ?   Mental Status: She is alert and oriented to person, place, and time.  ?Psychiatric:     ?   Behavior: Behavior normal.  ?  ? ?Musculoskeletal Exam: C-spine has limited ROM with lateral rotation.  Painful ROM of lumbar spine with some midline spinal tenderness in the lumbar region.  No SI joint tenderness.  Right shoulder replacement has limited ROM.  Left shoulder has good ROM with no discomfort.  Elbow joints, wrist joints, MCPs, PIPs, and DIPs have good ROM with no synovitis.  PIP and DIP thickening consistent with osteoarthritis of both hands.  Complete fist formation bilaterally.  No tenderness or synovitis over MCP joints.  Hip joints have limited ROM bilaterally.  Knee joints have good ROM with no discomfort.  No warmth or effusion of knee joints. Ankle joints have good ROM with no tenderness or joint swelling.  ? ?CDAI Exam: ?CDAI Score: -- ?Patient Global: --; Provider Global: -- ?Swollen: --; Tender: -- ?Joint Exam 05/18/2021  ? ?No joint exam has been documented for  this visit  ? ?There is currently no information documented on the homunculus. Go to the Rheumatology activity and complete the homunculus joint exam. ? ?Investigation: ?No additional findings. ? ?Imaging: ?No

## 2021-05-18 ENCOUNTER — Encounter: Payer: Self-pay | Admitting: Physician Assistant

## 2021-05-18 ENCOUNTER — Ambulatory Visit: Payer: PPO | Admitting: Physician Assistant

## 2021-05-18 VITALS — BP 128/84 | HR 75 | Ht 62.0 in | Wt 156.2 lb

## 2021-05-18 DIAGNOSIS — M353 Polymyalgia rheumatica: Secondary | ICD-10-CM | POA: Diagnosis not present

## 2021-05-18 DIAGNOSIS — M19041 Primary osteoarthritis, right hand: Secondary | ICD-10-CM | POA: Diagnosis not present

## 2021-05-18 DIAGNOSIS — M810 Age-related osteoporosis without current pathological fracture: Secondary | ICD-10-CM

## 2021-05-18 DIAGNOSIS — M1611 Unilateral primary osteoarthritis, right hip: Secondary | ICD-10-CM | POA: Diagnosis not present

## 2021-05-18 DIAGNOSIS — M5136 Other intervertebral disc degeneration, lumbar region: Secondary | ICD-10-CM | POA: Diagnosis not present

## 2021-05-18 DIAGNOSIS — E559 Vitamin D deficiency, unspecified: Secondary | ICD-10-CM | POA: Diagnosis not present

## 2021-05-18 DIAGNOSIS — M7061 Trochanteric bursitis, right hip: Secondary | ICD-10-CM

## 2021-05-18 DIAGNOSIS — Z79899 Other long term (current) drug therapy: Secondary | ICD-10-CM | POA: Diagnosis not present

## 2021-05-18 DIAGNOSIS — Z96611 Presence of right artificial shoulder joint: Secondary | ICD-10-CM

## 2021-05-18 DIAGNOSIS — M19042 Primary osteoarthritis, left hand: Secondary | ICD-10-CM

## 2021-05-18 DIAGNOSIS — D126 Benign neoplasm of colon, unspecified: Secondary | ICD-10-CM

## 2021-05-18 DIAGNOSIS — Z8639 Personal history of other endocrine, nutritional and metabolic disease: Secondary | ICD-10-CM

## 2021-05-18 DIAGNOSIS — M19071 Primary osteoarthritis, right ankle and foot: Secondary | ICD-10-CM

## 2021-05-18 DIAGNOSIS — Z8719 Personal history of other diseases of the digestive system: Secondary | ICD-10-CM

## 2021-05-18 DIAGNOSIS — Z7952 Long term (current) use of systemic steroids: Secondary | ICD-10-CM

## 2021-05-18 DIAGNOSIS — M19072 Primary osteoarthritis, left ankle and foot: Secondary | ICD-10-CM

## 2021-05-18 DIAGNOSIS — M51369 Other intervertebral disc degeneration, lumbar region without mention of lumbar back pain or lower extremity pain: Secondary | ICD-10-CM

## 2021-05-18 DIAGNOSIS — Z8781 Personal history of (healed) traumatic fracture: Secondary | ICD-10-CM | POA: Diagnosis not present

## 2021-05-18 DIAGNOSIS — Z8659 Personal history of other mental and behavioral disorders: Secondary | ICD-10-CM

## 2021-05-18 DIAGNOSIS — I2510 Atherosclerotic heart disease of native coronary artery without angina pectoris: Secondary | ICD-10-CM

## 2021-06-11 ENCOUNTER — Encounter: Payer: Self-pay | Admitting: Family Medicine

## 2021-06-19 ENCOUNTER — Telehealth: Payer: Self-pay

## 2021-06-19 DIAGNOSIS — M810 Age-related osteoporosis without current pathological fracture: Secondary | ICD-10-CM

## 2021-06-19 DIAGNOSIS — Z7952 Long term (current) use of systemic steroids: Secondary | ICD-10-CM

## 2021-06-19 NOTE — Telephone Encounter (Signed)
Patient called stating she scheduled her bone density at [facility] for 08/07/21.  Patient was told they need a physician's order faxed to their office.  Fax [phone]   ?

## 2021-06-20 ENCOUNTER — Other Ambulatory Visit: Payer: Self-pay | Admitting: Physician Assistant

## 2021-06-20 NOTE — Telephone Encounter (Signed)
Order placed. Order faxed.  ?

## 2021-06-21 NOTE — Telephone Encounter (Signed)
Next Visit: 10/25/2021 ? ?Last Visit: 05/18/2021 ? ?Last Fill: 03/13/2021 ? ?Dx: Polymyalgia rheumatica  ? ?Current Dose per office note on 05/18/2021: prednisone 5 mg 1 tablet daily ? ?Okay to refill Prednisone?   ?

## 2021-07-14 ENCOUNTER — Encounter: Payer: Self-pay | Admitting: Rheumatology

## 2021-07-14 DIAGNOSIS — Z79899 Other long term (current) drug therapy: Secondary | ICD-10-CM

## 2021-07-14 DIAGNOSIS — E559 Vitamin D deficiency, unspecified: Secondary | ICD-10-CM

## 2021-07-14 DIAGNOSIS — M810 Age-related osteoporosis without current pathological fracture: Secondary | ICD-10-CM

## 2021-07-14 MED ORDER — FORTEO 600 MCG/2.4ML ~~LOC~~ SOPN: PEN_INJECTOR | SUBCUTANEOUS | 0 refills | Status: DC

## 2021-07-14 NOTE — Telephone Encounter (Signed)
Next Visit: 10/25/2021  Last Visit: 05/18/2021  Last Fill: 01/17/2022  DX: Age-related osteoporosis without current pathological fracture   Current Dose per office note 05/18/2021: Forteo 620 mg per 2.48 mL sq daily injections  Labs: 09/04/2020 RBC 3.73, Hgb 11.5, MCV 102.4, Total Protein 6.4, Albumin 3.2, Alh Phos 154  Okay to refill Forteo?

## 2021-07-19 ENCOUNTER — Other Ambulatory Visit: Payer: Self-pay | Admitting: *Deleted

## 2021-07-19 DIAGNOSIS — E559 Vitamin D deficiency, unspecified: Secondary | ICD-10-CM

## 2021-07-19 DIAGNOSIS — Z79899 Other long term (current) drug therapy: Secondary | ICD-10-CM | POA: Diagnosis not present

## 2021-07-19 DIAGNOSIS — M810 Age-related osteoporosis without current pathological fracture: Secondary | ICD-10-CM | POA: Diagnosis not present

## 2021-07-20 LAB — CBC WITH DIFFERENTIAL/PLATELET
Absolute Monocytes: 668 cells/uL (ref 200–950)
Basophils Absolute: 62 cells/uL (ref 0–200)
Basophils Relative: 0.7 %
Eosinophils Absolute: 187 cells/uL (ref 15–500)
Eosinophils Relative: 2.1 %
HCT: 39.9 % (ref 35.0–45.0)
Hemoglobin: 13.2 g/dL (ref 11.7–15.5)
Lymphs Abs: 1255 cells/uL (ref 850–3900)
MCH: 30.2 pg (ref 27.0–33.0)
MCHC: 33.1 g/dL (ref 32.0–36.0)
MCV: 91.3 fL (ref 80.0–100.0)
MPV: 11.3 fL (ref 7.5–12.5)
Monocytes Relative: 7.5 %
Neutro Abs: 6728 cells/uL (ref 1500–7800)
Neutrophils Relative %: 75.6 %
Platelets: 224 10*3/uL (ref 140–400)
RBC: 4.37 10*6/uL (ref 3.80–5.10)
RDW: 13.1 % (ref 11.0–15.0)
Total Lymphocyte: 14.1 %
WBC: 8.9 10*3/uL (ref 3.8–10.8)

## 2021-07-20 LAB — COMPLETE METABOLIC PANEL WITH GFR
AG Ratio: 1.6 (calc) (ref 1.0–2.5)
ALT: 12 U/L (ref 6–29)
AST: 15 U/L (ref 10–35)
Albumin: 3.7 g/dL (ref 3.6–5.1)
Alkaline phosphatase (APISO): 102 U/L (ref 37–153)
BUN: 13 mg/dL (ref 7–25)
CO2: 29 mmol/L (ref 20–32)
Calcium: 9 mg/dL (ref 8.6–10.4)
Chloride: 104 mmol/L (ref 98–110)
Creat: 0.93 mg/dL (ref 0.60–1.00)
Globulin: 2.3 g/dL (calc) (ref 1.9–3.7)
Glucose, Bld: 88 mg/dL (ref 65–99)
Potassium: 3.6 mmol/L (ref 3.5–5.3)
Sodium: 141 mmol/L (ref 135–146)
Total Bilirubin: 0.5 mg/dL (ref 0.2–1.2)
Total Protein: 6 g/dL — ABNORMAL LOW (ref 6.1–8.1)
eGFR: 66 mL/min/{1.73_m2} (ref >=60)

## 2021-07-20 LAB — VITAMIN D 25 HYDROXY (VIT D DEFICIENCY, FRACTURES): Vit D, 25-Hydroxy: 28 ng/mL — ABNORMAL LOW (ref 30–100)

## 2021-07-20 NOTE — Progress Notes (Signed)
Vitamin D is low.  Patient should take vitamin D 2000 units daily.  CBC and CMP are normal.

## 2021-07-25 ENCOUNTER — Encounter: Payer: Self-pay | Admitting: Rheumatology

## 2021-08-01 ENCOUNTER — Other Ambulatory Visit: Payer: Self-pay | Admitting: Family Medicine

## 2021-08-07 DIAGNOSIS — Z1231 Encounter for screening mammogram for malignant neoplasm of breast: Secondary | ICD-10-CM | POA: Diagnosis not present

## 2021-08-07 DIAGNOSIS — Z780 Asymptomatic menopausal state: Secondary | ICD-10-CM | POA: Diagnosis not present

## 2021-08-07 DIAGNOSIS — M8589 Other specified disorders of bone density and structure, multiple sites: Secondary | ICD-10-CM | POA: Diagnosis not present

## 2021-08-07 LAB — HM DEXA SCAN

## 2021-08-07 LAB — HM MAMMOGRAPHY

## 2021-08-09 ENCOUNTER — Ambulatory Visit (INDEPENDENT_AMBULATORY_CARE_PROVIDER_SITE_OTHER): Payer: PPO

## 2021-08-09 ENCOUNTER — Ambulatory Visit: Payer: PPO | Admitting: Rheumatology

## 2021-08-09 ENCOUNTER — Encounter: Payer: Self-pay | Admitting: Rheumatology

## 2021-08-09 VITALS — BP 135/91 | HR 65 | Ht 61.0 in | Wt 155.0 lb

## 2021-08-09 DIAGNOSIS — M25552 Pain in left hip: Secondary | ICD-10-CM | POA: Diagnosis not present

## 2021-08-09 DIAGNOSIS — E559 Vitamin D deficiency, unspecified: Secondary | ICD-10-CM

## 2021-08-09 DIAGNOSIS — Z7952 Long term (current) use of systemic steroids: Secondary | ICD-10-CM

## 2021-08-09 DIAGNOSIS — M353 Polymyalgia rheumatica: Secondary | ICD-10-CM | POA: Diagnosis not present

## 2021-08-09 DIAGNOSIS — Z79899 Other long term (current) drug therapy: Secondary | ICD-10-CM | POA: Diagnosis not present

## 2021-08-09 DIAGNOSIS — Z96611 Presence of right artificial shoulder joint: Secondary | ICD-10-CM

## 2021-08-09 DIAGNOSIS — M19071 Primary osteoarthritis, right ankle and foot: Secondary | ICD-10-CM

## 2021-08-09 DIAGNOSIS — M810 Age-related osteoporosis without current pathological fracture: Secondary | ICD-10-CM

## 2021-08-09 DIAGNOSIS — D126 Benign neoplasm of colon, unspecified: Secondary | ICD-10-CM

## 2021-08-09 DIAGNOSIS — M19041 Primary osteoarthritis, right hand: Secondary | ICD-10-CM | POA: Diagnosis not present

## 2021-08-09 DIAGNOSIS — Z8781 Personal history of (healed) traumatic fracture: Secondary | ICD-10-CM

## 2021-08-09 DIAGNOSIS — Z8639 Personal history of other endocrine, nutritional and metabolic disease: Secondary | ICD-10-CM

## 2021-08-09 DIAGNOSIS — M7062 Trochanteric bursitis, left hip: Secondary | ICD-10-CM | POA: Diagnosis not present

## 2021-08-09 DIAGNOSIS — I2510 Atherosclerotic heart disease of native coronary artery without angina pectoris: Secondary | ICD-10-CM

## 2021-08-09 DIAGNOSIS — M5136 Other intervertebral disc degeneration, lumbar region: Secondary | ICD-10-CM

## 2021-08-09 DIAGNOSIS — Z8659 Personal history of other mental and behavioral disorders: Secondary | ICD-10-CM

## 2021-08-09 DIAGNOSIS — M19072 Primary osteoarthritis, left ankle and foot: Secondary | ICD-10-CM

## 2021-08-09 DIAGNOSIS — Z8719 Personal history of other diseases of the digestive system: Secondary | ICD-10-CM

## 2021-08-09 DIAGNOSIS — M19042 Primary osteoarthritis, left hand: Secondary | ICD-10-CM

## 2021-08-09 MED ORDER — LIDOCAINE HCL 1 % IJ SOLN
1.5000 mL | INTRAMUSCULAR | Status: AC | PRN
  Administered 2021-08-09: 1.5 mL

## 2021-08-09 MED ORDER — TRIAMCINOLONE ACETONIDE 40 MG/ML IJ SUSP
40.0000 mg | INTRAMUSCULAR | Status: AC | PRN
  Administered 2021-08-09: 40 mg via INTRA_ARTICULAR

## 2021-08-09 NOTE — Progress Notes (Signed)
Office Visit Note  Patient: [name]             Date of Birth: 1950-02-20           MRN: [id number]             PCP: [name], MD Referring: [name], MD Visit Date: 08/09/2021 Occupation: '@GUAROCC'$ @  Subjective:  Pain of the Left Hip   History of Present Illness: [name] is a 71 y.o. female polymyalgia rheumatica, osteoarthritis and osteoporosis.  She states for the last 4 days she has been having severe pain and discomfort in her left hip which she points to trochanteric area.  She states she is was having difficulty walking.  The pain has eased off to some extent.  She denies any increased muscular weakness or tenderness.  She continues to have some stiffness in her hands and her feet.  She has not noticed any joint swelling.  She continues to have lower back pain.  She has been taking Forteo injections for osteoporosis.  Activities of Daily Living:  Patient reports morning stiffness for a few hours.   Patient Denies nocturnal pain.  Difficulty dressing/grooming: Denies Difficulty climbing stairs: Denies Difficulty getting out of chair: Denies Difficulty using hands for taps, buttons, cutlery, and/or writing: Denies  Review of Systems  Constitutional: Negative.  Negative for fatigue.  HENT: Negative.  Negative for mouth sores and mouth dryness.   Eyes: Negative.  Negative for dryness.  Respiratory: Negative.  Negative for shortness of breath.   Cardiovascular: Negative.  Negative for chest pain and palpitations.  Gastrointestinal:  Negative for blood in stool, constipation and diarrhea.  Endocrine: Negative.  Negative for increased urination.  Genitourinary: Negative.  Negative for involuntary urination.  Musculoskeletal:  Positive for joint pain, gait problem, joint pain, myalgias, morning stiffness and myalgias.       Left hip painful   Skin: Negative.  Negative for color change, rash, hair loss and sensitivity to sunlight.   Allergic/Immunologic: Negative.  Negative for susceptible to infections.  Neurological:  Negative for dizziness and headaches.  Hematological: Negative.  Negative for swollen glands.  Psychiatric/Behavioral: Negative.  Negative for depressed mood and sleep disturbance. The patient is not nervous/anxious.     PMFS History:  Patient Active Problem List   Diagnosis Date Noted   Acute delirium 08/20/2020   Shoulder fracture 08/10/2020   Fall 08/10/2020   Lumbar compression fracture ([place]) 05/11/2019   Educated about COVID-19 virus infection 01/26/2019   Dyslipidemia 01/26/2019   Drug-induced myopathy 01/14/2019   Haglund's deformity 01/12/2019   Myalgia 12/30/2018   High risk medication use 12/04/2018   Recurrent major depressive disorder, in full remission ([place]) 09/03/2018   Lower esophageal ring ([place]) 03/03/2018   Osteoarthritis of right hip 12/02/2017   GERD (gastroesophageal reflux disease) 12/15/2015   Polyp of colon, adenomatous 10/11/2014   Vitamin D deficiency 05/26/2014   Osteoporosis 05/26/2014   Polymyalgia rheumatica ([place]) 12/01/2010   CAD (coronary artery disease) 09/09/2007   Hematuria 09/09/2007   Depression 03/20/2007   Hypothyroid 02/13/1999    Past Medical History:  Diagnosis Date   Anxiety    Arthritis    CAD (coronary artery disease)    Cataract    Depression    DISORDER, MENOPAUSAL NOS 03/20/2007   Qualifier: Diagnosis of  By: Arnoldo Morale MD, Balinda Quails    GERD (gastroesophageal reflux disease)    Hyperlipidemia    Hypothyroidism    MYOCARDIAL INFARCTION, HX  OF 09/09/2007   Qualifier: Diagnosis of  By: Leanne Chang MD, Bruce     Osteoporosis    Polymyalgia rheumatica ([place])     Family History  Problem Relation Age of Onset   Hypertension Father    Heart disease Father        Mi age 60, 19, 61 and 86.   Cancer Father    Hyperlipidemia Father    Colon cancer Maternal Uncle    Hypertension Mother    Cancer Mother    Hyperlipidemia Mother    Pulmonary  fibrosis Sister    Epilepsy Son    Healthy Son    Myasthenia gravis Son    Healthy Son    Rheum arthritis Niece    Lupus Niece    Rheum arthritis Niece    Past Surgical History:  Procedure Laterality Date   CATARACT EXTRACTION     CESAREAN SECTION     CHOLECYSTECTOMY N/A 11/24/2012   Procedure: LAPAROSCOPIC CHOLECYSTECTOMY;  Surgeon: Ralene Ok, MD;  Location: [facility];  Service: General;  Laterality: N/A;   COSMETIC SURGERY     EYE SURGERY     eye lids lifted   PTCA     REVERSE SHOULDER ARTHROPLASTY Right 08/12/2020   Procedure: REVERSE SHOULDER ARTHROPLASTY;  Surgeon: Nicholes Stairs, MD;  Location: [facility];  Service: Orthopedics;  Laterality: Right;   Social History   Social History Narrative   Husband and 2 adult children live at home. No grandkids. Son with epilepsy.      Retired at age 70    [place]- at [employer].     Immunization History  Administered Date(s) Administered   Fluad Quad(high Dose 65+) 11/10/2019, 11/21/2020   Influenza Whole 11/09/2008   Influenza, High Dose Seasonal PF 10/30/2016, 12/02/2017, 11/08/2018   Influenza-Unspecified 11/08/2018   PFIZER(Purple Top)SARS-COV-2 Vaccination 02/26/2019, 03/20/2019, 11/28/2019   PNEUMOCOCCAL CONJUGATE-20 06/21/2020   Pfizer Covid-19 Vaccine Bivalent Booster 73yr & up 11/21/2020, 07/22/2021   Pneumococcal Conjugate-13 10/11/2014   Pneumococcal Polysaccharide-23 12/15/2015   Td 02/12/2006   Tdap 04/26/2017   Zoster Recombinat (Shingrix) 07/18/2021   Zoster, Live 09/19/2010     Objective: Vital Signs: BP (!) 135/91   Pulse 65   Ht '5\' 1"'$  (1.549 m)   Wt 155 lb (70.3 kg)   BMI 29.29 kg/m    Physical Exam Vitals and nursing note reviewed.  Constitutional:      Appearance: She is well-developed.  HENT:     Head: Normocephalic and atraumatic.  Eyes:     Conjunctiva/sclera: Conjunctivae normal.  Cardiovascular:     Rate and Rhythm: Normal rate and regular rhythm.     Heart sounds: Normal heart  sounds.  Pulmonary:     Effort: Pulmonary effort is normal.     Breath sounds: Normal breath sounds.  Abdominal:     General: Bowel sounds are normal.     Palpations: Abdomen is soft.  Musculoskeletal:     Cervical back: Normal range of motion.  Lymphadenopathy:     Cervical: No cervical adenopathy.  Skin:    General: Skin is warm and dry.     Capillary Refill: Capillary refill takes less than 2 seconds.  Neurological:     Mental Status: She is alert and oriented to person, place, and time.  Psychiatric:        Behavior: Behavior normal.      Musculoskeletal Exam: She had limited lateral rotation of the cervical spine.  Thoracic kyphosis was noted.  She had  discomfort range of motion of her lumbar spine.  I right shoulder joint abduction was limited due to shoulder joint replacement.  Left shoulder joint was in good range of motion.  Elbow joints and wrist joints in good range of motion.  She had bilateral PIP and DIP thickening with no synovitis.  She had limited range of motion of bilateral hip joints with tenderness on palpation over left trochanteric bursa.  She had discomfort with abduction of her left hip joint.  Knee joints were in good range of motion without any warmth swelling effusion.  There was no tenderness over ankles or MTPs.  CDAI Exam: CDAI Score: -- Patient Global: --; Provider Global: -- Swollen: --; Tender: -- Joint Exam 08/09/2021   No joint exam has been documented for this visit   There is currently no information documented on the homunculus. Go to the Rheumatology activity and complete the homunculus joint exam.  Investigation: No additional findings.  Imaging: XR HIP UNILAT W OR W/O PELVIS 2-3 VIEWS LEFT  Result Date: 08/09/2021 No significant hip joint narrowing was noted.  Acetabular spurring was noted.  No SI joint sclerosis was noted.  Degenerative changes were noted in the lumbar spine.  No chondrocalcinosis was noted. Impression: These findings  are consistent with degenerative changes of the hip joint.   Recent Labs: Lab Results  Component Value Date   WBC 8.9 07/19/2021   HGB 13.2 07/19/2021   PLT 224 07/19/2021   NA 141 07/19/2021   K 3.6 07/19/2021   CL 104 07/19/2021   CO2 29 07/19/2021   GLUCOSE 88 07/19/2021   BUN 13 07/19/2021   CREATININE 0.93 07/19/2021   BILITOT 0.5 07/19/2021   ALKPHOS 154 (H) 09/04/2020   AST 15 07/19/2021   ALT 12 07/19/2021   PROT 6.0 (L) 07/19/2021   ALBUMIN 3.2 (L) 09/04/2020   CALCIUM 9.0 07/19/2021   GFRAA 75 04/22/2020   QFTBGOLDPLUS NEGATIVE 08/12/2018    Speciality Comments: Forteo October 2021-February 2022 restarted June 2022 (missed 3 weeks due to fracture)  Procedures:  Large Joint Inj: L greater trochanter on 08/09/2021 11:35 AM Indications: pain Details: 27 G 1.5 in needle, lateral approach  Arthrogram: No  Medications: 40 mg triamcinolone acetonide 40 MG/ML; 1.5 mL lidocaine 1 % Aspirate: 0 mL Outcome: tolerated well, no immediate complications Procedure, treatment alternatives, risks and benefits explained, specific risks discussed. Consent was given by the patient. Immediately prior to procedure a time out was called to verify the correct patient, procedure, equipment, support staff and site/side marked as required. Patient was prepped and draped in the usual sterile fashion.     Allergies: Morphine and related, Prochlorperazine, Simvastatin, and Sulfa antibiotics   Assessment / Plan:     Visit Diagnoses: Polymyalgia rheumatica ([place]) - DXd 2015 by another rheumatologist.  She is on prednisone 5 mg p.o. daily and he states asymptomatic.  She had no muscular weakness or tenderness on my examination.  Long term (current) use of systemic steroids - Prednisone 5 mg p.o. daily.  She is unable to taper prednisone.  Side effects of long-term use of steroids were discussed.  Patient voiced understanding.  High risk medication use - She discontinued methotrexate in April  2022 as she did not notice any benefit.  S/P shoulder replacement, right-she has limited range of motion of her right shoulder joint.  Primary osteoarthritis of both hands-bilateral PIP and DIP thickening was noted.  No synovitis was noted.  Joint protection was discussed.  Trochanteric bursitis,  left hip-she has been having severe pain and discomfort in her left hip and left trochanteric region for the last 4 days.  She states she was having difficulty walking which is eased off.  She is walking with difficulty.  After different treatment options were discussed and side effects were reviewed left trochanteric bursa was injected with lidocaine and Kenalog as described above.  She tolerated the procedure well.  Postprocedure instructions were given.  I handout on IT band stretches was given.  Pain in left hip -she had limited painful range of motion of her left hip joint.  I will obtain x-rays to evaluate this further.  Plan: XR HIP UNILAT W OR W/O PELVIS 2-3 VIEWS LEFT.  The x-rays showed spurring of the acetabulum which may be causing impingement.  I advised her to contact me if her symptoms persist and do not improve.  She would benefit from intra-articular hip joint injection.  Degenerative changes in bilateral hip joint and lumbar spine were noted.  Primary osteoarthritis of both feet-she has osteoarthritis in her feet but is currently not having much discomfort.  DDD (degenerative disc disease), lumbar -she has chronic pain in her lower back.  She is followed by Dr. Lorin Mercy.  Age-related osteoporosis without current pathological fracture - June 08, 2019 T score -2.5 left femoral neck, BMD 0.575% change 1%.  Forteo started October 2021 (gap February 22 till June 22).  Use of calcium rich diet and exercise was emphasized.  Need for regular exercise was discussed.  History of compression fracture of spine - L1 compression fracture with 60% vertebral body height loss.  Vitamin D deficiency-she is  on vitamin D 1000 units daily.  Other medical problems are listed as follows:  Coronary artery disease involving native coronary artery of native heart without angina pectoris  History of gastroesophageal reflux (GERD)  Adenomatous polyp of colon, unspecified part of colon  History of hypothyroidism  History of depression  Orders: Orders Placed This Encounter  Procedures   Large Joint Inj   XR HIP UNILAT W OR W/O PELVIS 2-3 VIEWS LEFT   No orders of the defined types were placed in this encounter.   Follow-Up Instructions: Return in about 3 months (around 11/09/2021) for Polymyalgia rheumatica, Osteoarthritis.   Bo Merino, MD  Note - This record has been created using Editor, commissioning.  Chart creation errors have been sought, but may not always  have been located. Such creation errors do not reflect on  the standard of medical care.

## 2021-08-09 NOTE — Patient Instructions (Signed)
Iliotibial Band Syndrome Rehab Ask your health care provider which exercises are safe for you. Do exercises exactly as told by your health care provider and adjust them as directed. It is normal to feel mild stretching, pulling, tightness, or discomfort as you do these exercises. Stop right away if you feel sudden pain or your pain gets significantly worse. Do not begin these exercises until told by your health care provider. Stretching and range-of-motion exercises These exercises warm up your muscles and joints and improve the movement and flexibility of your hip and pelvis. Quadriceps stretch, prone  Lie on your abdomen (prone position) on a firm surface, such as a bed or padded floor. Bend your left / right knee and reach back to hold your ankle or pant leg. If you cannot reach your ankle or pant leg, loop a belt around your foot and grab the belt instead. Gently pull your heel toward your buttocks. Your knee should not slide out to the side. You should feel a stretch in the front of your thigh and knee (quadriceps). Hold this position for __________ seconds. Repeat __________ times. Complete this exercise __________ times a day. Iliotibial band stretch An iliotibial band is a strong band of muscle tissue that runs from the outer side of your hip to the outer side of your thigh and knee. Lie on your side with your left / right leg in the top position. Bend both of your knees and grab your left / right ankle. Stretch out your bottom arm to help you balance. Slowly bring your top knee back so your thigh goes behind your trunk. Slowly lower your top leg toward the floor until you feel a gentle stretch on the outside of your left / right hip and thigh. If you do not feel a stretch and your knee will not fall farther, place the heel of your other foot on top of your knee and pull your knee down toward the floor with your foot. Hold this position for __________ seconds. Repeat __________ times.  Complete this exercise __________ times a day. Strengthening exercises These exercises build strength and endurance in your hip and pelvis. Endurance is the ability to use your muscles for a long time, even after they get tired. Straight leg raises, side-lying This exercise strengthens the muscles that rotate the leg at the hip and move it away from your body (hip abductors). Lie on your side with your left / right leg in the top position. Lie so your head, shoulder, hip, and knee line up. You may bend your bottom knee to help you balance. Roll your hips slightly forward so your hips are stacked directly over each other and your left / right knee is facing forward. Tense the muscles in your outer thigh and lift your top leg 4-6 inches (10-15 cm). Hold this position for __________ seconds. Slowly lower your leg to return to the starting position. Let your muscles relax completely before doing another repetition. Repeat __________ times. Complete this exercise __________ times a day. Leg raises, prone This exercise strengthens the muscles that move the hips backward (hip extensors). Lie on your abdomen (prone position) on your bed or a firm surface. You can put a pillow under your hips if that is more comfortable for your lower back. Bend your left / right knee so your foot is straight up in the air. Squeeze your buttocks muscles and lift your left / right thigh off the bed. Do not let your back arch. Tense   your thigh muscle as hard as you can without increasing any knee pain. Hold this position for __________ seconds. Slowly lower your leg to return to the starting position and allow it to relax completely. Repeat __________ times. Complete this exercise __________ times a day. Hip hike Stand sideways on a bottom step. Stand on your left / right leg with your other foot unsupported next to the step. You can hold on to a railing or wall for balance if needed. Keep your knees straight and your  torso square. Then lift your left / right hip up toward the ceiling. Slowly let your left / right hip lower toward the floor, past the starting position. Your foot should get closer to the floor. Do not lean or bend your knees. Repeat __________ times. Complete this exercise __________ times a day. This information is not intended to replace advice given to you by your health care provider. Make sure you discuss any questions you have with your health care provider. Document Revised: 04/08/2019 Document Reviewed: 04/08/2019 Elsevier Patient Education  2023 Elsevier Inc.  

## 2021-08-17 ENCOUNTER — Other Ambulatory Visit: Payer: Self-pay | Admitting: Physician Assistant

## 2021-08-17 NOTE — Telephone Encounter (Signed)
Next Visit: 10/25/2021  Last Visit: 08/09/2021  Last Fill: 07/14/2021 (30 day supply)  DX: Age-related osteoporosis without current pathological fracture   Current Dose per office note 08/09/2021: Forteo started October 2021 (gap February 22 till June 22)  Labs: 07/19/2021 CBC and CMP are normal.  Okay to refill Forteo?

## 2021-08-18 ENCOUNTER — Telehealth: Payer: Self-pay | Admitting: Orthopaedic Surgery

## 2021-08-18 ENCOUNTER — Encounter: Payer: Self-pay | Admitting: Rheumatology

## 2021-08-18 NOTE — Telephone Encounter (Signed)
Patient states she is having left hip pain, offered her the next available on July 28th and wanted to know if she could be seen any sooner. CB # [phone]

## 2021-08-18 NOTE — Telephone Encounter (Signed)
I reviewed the chart.  Dr. Lorin Mercy is scheduled in urgent appointment for [facility] today.  I do not recommend use of anti-inflammatories including ibuprofen while patients are on prednisone due to increased risk of GI bleeding.  Tylenol will be the only option.

## 2021-08-21 NOTE — Telephone Encounter (Signed)
Scheduled for wed at 1pm

## 2021-08-23 ENCOUNTER — Encounter: Payer: Self-pay | Admitting: Orthopaedic Surgery

## 2021-08-23 ENCOUNTER — Ambulatory Visit: Payer: PPO | Admitting: Orthopaedic Surgery

## 2021-08-23 DIAGNOSIS — M1612 Unilateral primary osteoarthritis, left hip: Secondary | ICD-10-CM

## 2021-08-23 NOTE — Progress Notes (Unsigned)
Office Visit Note   Patient: [name]           Date of Birth: Aug 28, 1950           MRN: [id number] Visit Date: 08/23/2021              Requested by: [name], MD [place],  [place] [id number] PCP: [name], MD   Assessment & Plan: Visit Diagnoses: No diagnosis found.  Plan: ***  Follow-Up Instructions: No follow-ups on file.   Orders:  No orders of the defined types were placed in this encounter.  No orders of the defined types were placed in this encounter.     Procedures: No procedures performed   Clinical Data: No additional findings.   Subjective: Chief Complaint  Patient presents with   Left Hip - Pain    HPI  Review of Systems   Objective: Vital Signs: BP (!) 147/88   Pulse 85   Ht '5\' 1"'$  (1.549 m)   Wt 155 lb (70.3 kg)   BMI 29.29 kg/m   Physical Exam  Ortho Exam  Specialty Comments:  No specialty comments available.  Imaging: No results found.   PMFS History: Patient Active Problem List   Diagnosis Date Noted   Acute delirium 08/20/2020   Shoulder fracture 08/10/2020   Fall 08/10/2020   Lumbar compression fracture ([place]) 05/11/2019   Educated about COVID-19 virus infection 01/26/2019   Dyslipidemia 01/26/2019   Drug-induced myopathy 01/14/2019   Haglund's deformity 01/12/2019   Myalgia 12/30/2018   High risk medication use 12/04/2018   Recurrent major depressive disorder, in full remission ([place]) 09/03/2018   Lower esophageal ring ([place]) 03/03/2018   Osteoarthritis of right hip 12/02/2017   GERD (gastroesophageal reflux disease) 12/15/2015   Polyp of colon, adenomatous 10/11/2014   Vitamin D deficiency 05/26/2014   Osteoporosis 05/26/2014   Polymyalgia rheumatica ([place]) 12/01/2010   CAD (coronary artery disease) 09/09/2007   Hematuria 09/09/2007   Depression 03/20/2007   Hypothyroid 02/13/1999   Past Medical History:  Diagnosis Date   Anxiety    Arthritis    CAD (coronary  artery disease)    Cataract    Depression    DISORDER, MENOPAUSAL NOS 03/20/2007   Qualifier: Diagnosis of  By: Arnoldo Morale MD, Balinda Quails    GERD (gastroesophageal reflux disease)    Hyperlipidemia    Hypothyroidism    MYOCARDIAL INFARCTION, HX OF 09/09/2007   Qualifier: Diagnosis of  By: Leanne Chang MD, Bruce     Osteoporosis    Polymyalgia rheumatica ([place])     Family History  Problem Relation Age of Onset   Hypertension Father    Heart disease Father        Mi age 59, 8, 15 and 18.   Cancer Father    Hyperlipidemia Father    Colon cancer Maternal Uncle    Hypertension Mother    Cancer Mother    Hyperlipidemia Mother    Pulmonary fibrosis Sister    Epilepsy Son    Healthy Son    Myasthenia gravis Son    Healthy Son    Rheum arthritis Niece    Lupus Niece    Rheum arthritis Niece     Past Surgical History:  Procedure Laterality Date   CATARACT EXTRACTION     CESAREAN SECTION     CHOLECYSTECTOMY N/A 11/24/2012   Procedure: LAPAROSCOPIC CHOLECYSTECTOMY;  Surgeon: Ralene Ok, MD;  Location: [facility];  Service: General;  Laterality: N/A;  COSMETIC SURGERY     EYE SURGERY     eye lids lifted   PTCA     REVERSE SHOULDER ARTHROPLASTY Right 08/12/2020   Procedure: REVERSE SHOULDER ARTHROPLASTY;  Surgeon: Nicholes Stairs, MD;  Location: [facility];  Service: Orthopedics;  Laterality: Right;   Social History   Occupational History   Not on file  Tobacco Use   Smoking status: Never    Passive exposure: Never   Smokeless tobacco: Never  Vaping Use   Vaping Use: Never used  Substance and Sexual Activity   Alcohol use: No    Alcohol/week: 0.0 standard drinks of alcohol   Drug use: No   Sexual activity: Not on file

## 2021-08-24 DIAGNOSIS — M1612 Unilateral primary osteoarthritis, left hip: Secondary | ICD-10-CM | POA: Insufficient documentation

## 2021-09-09 ENCOUNTER — Other Ambulatory Visit: Payer: Self-pay | Admitting: Physician Assistant

## 2021-09-11 NOTE — Telephone Encounter (Signed)
Next Visit: 10/25/2021   Last Visit: 08/09/2021   Last Fill: 06/21/2021  Dx: Polymyalgia rheumatica   Current Dose per office note on 08/09/2021: Prednisone 5 mg p.o. daily.  Okay to refill Prednisone?

## 2021-09-15 ENCOUNTER — Encounter: Payer: Self-pay | Admitting: Cardiology

## 2021-10-10 ENCOUNTER — Encounter: Payer: Self-pay | Admitting: Orthopaedic Surgery

## 2021-10-10 ENCOUNTER — Encounter: Payer: Self-pay | Admitting: Family Medicine

## 2021-10-10 DIAGNOSIS — M545 Low back pain, unspecified: Secondary | ICD-10-CM

## 2021-10-12 NOTE — Progress Notes (Deleted)
Office Visit Note  Patient: [name]             Date of Birth: Jun 29, 1950           MRN: [id number]             PCP: [name], MD Referring: [name], MD Visit Date: 10/25/2021 Occupation: '@GUAROCC'$ @  Subjective:  No chief complaint on file.   History of Present Illness: [name] is a 71 y.o. female ***   Activities of Daily Living:  Patient reports morning stiffness for *** {minute/hour:19697}.   Patient {ACTIONS;DENIES/REPORTS:21021675::"Denies"} nocturnal pain.  Difficulty dressing/grooming: {ACTIONS;DENIES/REPORTS:21021675::"Denies"} Difficulty climbing stairs: {ACTIONS;DENIES/REPORTS:21021675::"Denies"} Difficulty getting out of chair: {ACTIONS;DENIES/REPORTS:21021675::"Denies"} Difficulty using hands for taps, buttons, cutlery, and/or writing: {ACTIONS;DENIES/REPORTS:21021675::"Denies"}  No Rheumatology ROS completed.   PMFS History:  Patient Active Problem List   Diagnosis Date Noted  . Unilateral primary osteoarthritis, left hip 08/24/2021  . Acute delirium 08/20/2020  . Shoulder fracture 08/10/2020  . Fall 08/10/2020  . Lumbar compression fracture ([place]) 05/11/2019  . Educated about COVID-19 virus infection 01/26/2019  . Dyslipidemia 01/26/2019  . Drug-induced myopathy 01/14/2019  . Haglund's deformity 01/12/2019  . Myalgia 12/30/2018  . High risk medication use 12/04/2018  . Recurrent major depressive disorder, in full remission ([place]) 09/03/2018  . Lower esophageal ring ([place]) 03/03/2018  . Osteoarthritis of right hip 12/02/2017  . GERD (gastroesophageal reflux disease) 12/15/2015  . Polyp of colon, adenomatous 10/11/2014  . Vitamin D deficiency 05/26/2014  . Osteoporosis 05/26/2014  . Polymyalgia rheumatica ([place]) 12/01/2010  . CAD (coronary artery disease) 09/09/2007  . Hematuria 09/09/2007  . Depression 03/20/2007  . Hypothyroid 02/13/1999    Past Medical History:  Diagnosis Date  . Anxiety   . Arthritis   . CAD  (coronary artery disease)   . Cataract   . Depression   . DISORDER, MENOPAUSAL NOS 03/20/2007   Qualifier: Diagnosis of  By: Arnoldo Morale MD, Balinda Quails   . GERD (gastroesophageal reflux disease)   . Hyperlipidemia   . Hypothyroidism   . MYOCARDIAL INFARCTION, HX OF 09/09/2007   Qualifier: Diagnosis of  By: Leanne Chang MD, Bruce    . Osteoporosis   . Polymyalgia rheumatica (HCC)     Family History  Problem Relation Age of Onset  . Hypertension Father   . Heart disease Father        Mi age 55, 17, 75 and 51.  . Cancer Father   . Hyperlipidemia Father   . Colon cancer Maternal Uncle   . Hypertension Mother   . Cancer Mother   . Hyperlipidemia Mother   . Pulmonary fibrosis Sister   . Epilepsy Son   . Healthy Son   . Myasthenia gravis Son   . Healthy Son   . Rheum arthritis Niece   . Lupus Niece   . Rheum arthritis Niece    Past Surgical History:  Procedure Laterality Date  . CATARACT EXTRACTION    . CESAREAN SECTION    . CHOLECYSTECTOMY N/A 11/24/2012   Procedure: LAPAROSCOPIC CHOLECYSTECTOMY;  Surgeon: Ralene Ok, MD;  Location: [facility];  Service: General;  Laterality: N/A;  . COSMETIC SURGERY    . EYE SURGERY     eye lids lifted  . PTCA    . REVERSE SHOULDER ARTHROPLASTY Right 08/12/2020   Procedure: REVERSE SHOULDER ARTHROPLASTY;  Surgeon: Nicholes Stairs, MD;  Location: [facility];  Service: Orthopedics;  Laterality: Right;   Social History   Social History Narrative  Husband and 2 adult children live at home. No grandkids. Son with epilepsy.      Retired at age 75    [place]- at [employer].     Immunization History  Administered Date(s) Administered  . Fluad Quad(high Dose 65+) 11/10/2019, 11/21/2020  . Influenza Whole 11/09/2008  . Influenza, High Dose Seasonal PF 10/30/2016, 12/02/2017, 11/08/2018  . Influenza-Unspecified 11/08/2018  . PFIZER(Purple Top)SARS-COV-2 Vaccination 02/26/2019, 03/20/2019, 11/28/2019  . PNEUMOCOCCAL CONJUGATE-20 06/21/2020  . Museum/gallery curator 68yr & up 11/21/2020, 07/22/2021  . Pneumococcal Conjugate-13 10/11/2014  . Pneumococcal Polysaccharide-23 12/15/2015  . Td 02/12/2006  . Tdap 04/26/2017  . Zoster Recombinat (Shingrix) 07/18/2021, 09/19/2021  . Zoster, Live 09/19/2010     Objective: Vital Signs: There were no vitals taken for this visit.   Physical Exam   Musculoskeletal Exam: ***  CDAI Exam: CDAI Score: -- Patient Global: --; Provider Global: -- Swollen: --; Tender: -- Joint Exam 10/25/2021   No joint exam has been documented for this visit   There is currently no information documented on the homunculus. Go to the Rheumatology activity and complete the homunculus joint exam.  Investigation: No additional findings.  Imaging: No results found.  Recent Labs: Lab Results  Component Value Date   WBC 8.9 07/19/2021   HGB 13.2 07/19/2021   PLT 224 07/19/2021   NA 141 07/19/2021   K 3.6 07/19/2021   CL 104 07/19/2021   CO2 29 07/19/2021   GLUCOSE 88 07/19/2021   BUN 13 07/19/2021   CREATININE 0.93 07/19/2021   BILITOT 0.5 07/19/2021   ALKPHOS 154 (H) 09/04/2020   AST 15 07/19/2021   ALT 12 07/19/2021   PROT 6.0 (L) 07/19/2021   ALBUMIN 3.2 (L) 09/04/2020   CALCIUM 9.0 07/19/2021   GFRAA 75 04/22/2020   QFTBGOLDPLUS NEGATIVE 08/12/2018    Speciality Comments: Forteo October 2021-February 2022 restarted June 2022 (missed 3 weeks due to fracture)  Procedures:  No procedures performed Allergies: Morphine and related, Prochlorperazine, Simvastatin, and Sulfa antibiotics   Assessment / Plan:     Visit Diagnoses: No diagnosis found.  Orders: No orders of the defined types were placed in this encounter.  No orders of the defined types were placed in this encounter.   Face-to-face time spent with patient was *** minutes. Greater than 50% of time was spent in counseling and coordination of care.  Follow-Up Instructions: No follow-ups on file.   MEarnestine Mealing CMA  Note - This record has been created using DEditor, commissioning  Chart creation errors have been sought, but may not always  have been located. Such creation errors do not reflect on  the standard of medical care.

## 2021-10-24 ENCOUNTER — Encounter: Payer: Self-pay | Admitting: Family Medicine

## 2021-10-25 ENCOUNTER — Ambulatory Visit: Payer: PPO | Admitting: Physical Medicine and Rehabilitation

## 2021-10-25 ENCOUNTER — Ambulatory Visit: Payer: Self-pay

## 2021-10-25 ENCOUNTER — Ambulatory Visit (INDEPENDENT_AMBULATORY_CARE_PROVIDER_SITE_OTHER): Payer: PPO | Admitting: Family Medicine

## 2021-10-25 ENCOUNTER — Ambulatory Visit: Payer: PPO | Admitting: Rheumatology

## 2021-10-25 ENCOUNTER — Encounter: Payer: Self-pay | Admitting: Family Medicine

## 2021-10-25 ENCOUNTER — Encounter: Payer: Self-pay | Admitting: Physical Medicine and Rehabilitation

## 2021-10-25 VITALS — BP 148/87 | Ht 61.5 in | Wt 156.0 lb

## 2021-10-25 VITALS — BP 122/80 | HR 79 | Temp 98.1°F | Ht 61.0 in | Wt 160.2 lb

## 2021-10-25 DIAGNOSIS — M7062 Trochanteric bursitis, left hip: Secondary | ICD-10-CM

## 2021-10-25 DIAGNOSIS — Z79899 Other long term (current) drug therapy: Secondary | ICD-10-CM

## 2021-10-25 DIAGNOSIS — M25552 Pain in left hip: Secondary | ICD-10-CM

## 2021-10-25 DIAGNOSIS — E559 Vitamin D deficiency, unspecified: Secondary | ICD-10-CM

## 2021-10-25 DIAGNOSIS — Z8719 Personal history of other diseases of the digestive system: Secondary | ICD-10-CM

## 2021-10-25 DIAGNOSIS — G720 Drug-induced myopathy: Secondary | ICD-10-CM | POA: Diagnosis not present

## 2021-10-25 DIAGNOSIS — M19071 Primary osteoarthritis, right ankle and foot: Secondary | ICD-10-CM

## 2021-10-25 DIAGNOSIS — Z8781 Personal history of (healed) traumatic fracture: Secondary | ICD-10-CM

## 2021-10-25 DIAGNOSIS — M5416 Radiculopathy, lumbar region: Secondary | ICD-10-CM | POA: Diagnosis not present

## 2021-10-25 DIAGNOSIS — M810 Age-related osteoporosis without current pathological fracture: Secondary | ICD-10-CM

## 2021-10-25 DIAGNOSIS — Z8639 Personal history of other endocrine, nutritional and metabolic disease: Secondary | ICD-10-CM

## 2021-10-25 DIAGNOSIS — M5136 Other intervertebral disc degeneration, lumbar region: Secondary | ICD-10-CM

## 2021-10-25 DIAGNOSIS — E785 Hyperlipidemia, unspecified: Secondary | ICD-10-CM | POA: Diagnosis not present

## 2021-10-25 DIAGNOSIS — E034 Atrophy of thyroid (acquired): Secondary | ICD-10-CM

## 2021-10-25 DIAGNOSIS — Z7952 Long term (current) use of systemic steroids: Secondary | ICD-10-CM

## 2021-10-25 DIAGNOSIS — Z96611 Presence of right artificial shoulder joint: Secondary | ICD-10-CM

## 2021-10-25 DIAGNOSIS — I2510 Atherosclerotic heart disease of native coronary artery without angina pectoris: Secondary | ICD-10-CM

## 2021-10-25 DIAGNOSIS — M19041 Primary osteoarthritis, right hand: Secondary | ICD-10-CM

## 2021-10-25 DIAGNOSIS — M353 Polymyalgia rheumatica: Secondary | ICD-10-CM

## 2021-10-25 DIAGNOSIS — D126 Benign neoplasm of colon, unspecified: Secondary | ICD-10-CM

## 2021-10-25 DIAGNOSIS — Z8659 Personal history of other mental and behavioral disorders: Secondary | ICD-10-CM

## 2021-10-25 DIAGNOSIS — F3342 Major depressive disorder, recurrent, in full remission: Secondary | ICD-10-CM | POA: Diagnosis not present

## 2021-10-25 MED ORDER — METHYLPREDNISOLONE ACETATE 80 MG/ML IJ SUSP
40.0000 mg | Freq: Once | INTRAMUSCULAR | Status: AC
  Administered 2021-10-25: 40 mg

## 2021-10-25 NOTE — Progress Notes (Signed)
Phone [phone] In person visit   Subjective:   [name] is a 71 y.o. year old very pleasant female patient who presents for/with See problem oriented charting Chief Complaint  Patient presents with   wieght gain    Pt c/o weight gain over the last few weeks, she does not exercise as much she should but she does cook at home.   Past Medical History-  Patient Active Problem List   Diagnosis Date Noted   Polymyalgia rheumatica ([place]) 12/01/2010    Priority: High   CAD (coronary artery disease) 09/09/2007    Priority: High   Dyslipidemia 01/26/2019    Priority: Medium    Drug-induced myopathy 01/14/2019    Priority: Medium    High risk medication use 12/04/2018    Priority: Medium    Lower esophageal ring ([place]) 03/03/2018    Priority: Medium    Osteoarthritis of right hip 12/02/2017    Priority: Medium    GERD (gastroesophageal reflux disease) 12/15/2015    Priority: Medium    Vitamin D deficiency 05/26/2014    Priority: Medium    Osteoporosis 05/26/2014    Priority: Medium    Depression 03/20/2007    Priority: Medium    Hypothyroid 02/13/1999    Priority: Medium    Polyp of colon, adenomatous 10/11/2014    Priority: Low   Hematuria 09/09/2007    Priority: Low   Unilateral primary osteoarthritis, left hip 08/24/2021    Priority: 1.   Shoulder fracture 08/10/2020    Priority: 1.   Lumbar compression fracture ([place]) 05/11/2019    Priority: 1.   Haglund's deformity 01/12/2019    Priority: 1.    Medications- reviewed and updated Current Outpatient Medications  Medication Sig Dispense Refill   acetaminophen (TYLENOL) 500 MG tablet Take 2 tablets (1,000 mg total) by mouth every 6 (six) hours as needed. 30 tablet 0   aspirin 81 MG EC tablet Take 81 mg by mouth at bedtime.     FORTEO 600 MCG/2.4ML SOPN INJECT 20MCG UNDER THE SKIN ONCE DAILY 7.2 mL 0   levothyroxine (SYNTHROID) 100 MCG tablet TAKE 1 TABLET BY MOUTH EVERY DAY 90 tablet 3   pantoprazole  (PROTONIX) 40 MG tablet TAKE 1 TABLET BY MOUTH EVERY DAY 90 tablet 0   PARoxetine (PAXIL) 20 MG tablet TAKE 1 TABLET BY MOUTH EVERY MORNING 90 tablet 0   predniSONE (DELTASONE) 5 MG tablet TAKE 1 TABLET(5 MG) BY MOUTH DAILY WITH BREAKFAST 90 tablet 0   REPATHA SURECLICK 176 MG/ML SOAJ INJECT 1 PEN SUBCUTANEOUSLY EVERY 14 DAYS 2 mL 11   cholecalciferol (VITAMIN D3) 25 MCG (1000 UNIT) tablet Take 1,000 Units by mouth at bedtime.     No current facility-administered medications for this visit.     Objective:  BP 122/80   Pulse 79   Temp 98.1 F (36.7 C)   Ht '5\' 1"'$  (1.549 m)   Wt 160 lb 3.2 oz (72.7 kg)   SpO2 98%   BMI 30.27 kg/m  Gen: NAD, resting comfortably No thyromegaly CV: RRR no murmurs rubs or gallops Lungs: CTAB no crackles, wheeze, rhonchi Ext: no edema Skin: warm, dry     Assessment and Plan   # Weight gain/fatigue S: weight same as last year but patient reports up 5 lbs within 2 months but had gotten as low on office scales as 143- so up 17 lbs from February compared to that- that was around time of shoulder sugery.  Also feels more fatigued  in general. Feels tired in the daytime- wonders if related to retirement or something else. Declines depression. No new medications recently. No chest pain or shortness of breath. No leg swelling A/P: Patient primarily worried about her thyroid but she is agreeable to updating other labs to look for other sources of potential fatigue with CBC and CMP.  No urinary issues and declines UA for now  #hypothyroidism S: compliant On thyroid medication- levothyroxine 100 mcg. Thyroid was underactive last July- does not appear meds were adjusted (I have not seen her since that time) Lab Results  Component Value Date   TSH 10.140 (H) 08/20/2020   A/P:concern thyroid may be undertreated- we need to update thyroid levels- TSH-for now continue current medication  #hyperlipidemia S: Medication: statin induced myalgia  but thankfully  tolerating repatha Lab Results  Component Value Date   CHOL 187 06/21/2020   HDL 69.90 06/21/2020   LDLCALC 105 (H) 06/21/2020   LDLDIRECT 176.0 12/15/2015   TRIG 58.0 06/21/2020   CHOLHDL 3 06/21/2020   A/P: Hopefully remains significantly improved from peak on Repatha-continue current medications.  Statin intolerant due to myalgias  # Depression S: Medication: paxil 20 mg    10/25/2021   11:41 AM 03/10/2021    3:27 PM 06/21/2020    2:17 PM  Depression screen PHQ 2/9  Decreased Interest 0 0 0  Down, Depressed, Hopeless 0 0 0  PHQ - 2 Score 0 0 0  Altered sleeping 0  0  Tired, decreased energy 2  1  Change in appetite 0  0  Feeling bad or failure about yourself  0  0  Trouble concentrating 0  0  Moving slowly or fidgety/restless 0  0  Suicidal thoughts 0  0  PHQ-9 Score 2  1  Difficult doing work/chores Not difficult at all  Not difficult at all  A/P: full remission- continue current meds.  She declines this as the cause of her fatigue  #Vitamin D deficiency S: Medication: Prior high-dose vitamin D-on 1000 units daily in the past as well-she reports per medication review that she has stopped this Last vitamin D Lab Results  Component Value Date   VD25OH 28 (L) 07/19/2021  A/P: Mildly low last visit-hoping improved-May need another high-dose boost of vitamin D and suspect will need ongoing vitamin D  #scheduled for steroid injection this afternoon- could raise sugar for labs  Recommended follow up: Return in about 6 months (around 04/25/2022) for physical or sooner if needed.Schedule b4 you leave. Future Appointments  Date Time Provider [place]  10/25/2021  3:00 PM Magnus Sinning, MD [facility] None  11/01/2021  8:45 AM [facility] LAB [facility]  11/23/2021 11:00 AM Bo Merino, MD [facility] None  03/16/2022  3:15 PM [facility] HEALTH COACH [facility]  04/25/2022  9:00 AM [name], MD [facility]    Lab/Order associations:   ICD-10-CM   1.  Hypothyroidism due to acquired atrophy of thyroid  E03.4 TSH    2. Recurrent major depressive disorder, in full remission ([place])  F33.42     3. Drug-induced myopathy  G72.0     4. Dyslipidemia  E78.5 CBC with Differential/Platelet    Comprehensive metabolic panel    Lipid panel    5. Vitamin D deficiency  E55.9 VITAMIN D 25 Hydroxy (Vit-D Deficiency, Fractures)     No orders of the defined types were placed in this encounter.   Return precautions advised.  Garret Reddish, MD

## 2021-10-25 NOTE — Patient Instructions (Signed)

## 2021-10-25 NOTE — Progress Notes (Signed)
Patient is here with back pain. The pain is more center to left. It is not radiating down the leg. There is no numbness or tingling in the leg. She states that it hurts to do anything.  She is taking ibuprofen for the pain with slight relief. She is also using the heating pad.  Numeric Pain Rating Scale and Functional Assessment Average Pain 8   In the last MONTH (on 0-10 scale) has pain interfered with the following?  1. General activity like being  able to carry out your everyday physical activities such as walking, climbing stairs, carrying groceries, or moving a chair?  Rating(10)   +Driver, -BT, -Dye Allergies.

## 2021-10-25 NOTE — Patient Instructions (Addendum)
Schedule a lab visit at the check out desk within 2 weeks- prefer tomorrow if possible. Return for future fasting labs meaning nothing but water after midnight please. Ok to take your medications with water.   Sounds like may need to tweak thyroid meds once we get results back  Let us know when you get flu shot  Recommended follow up: Return in about 6 months (around 04/25/2022) for physical or sooner if needed.Schedule b4 you leave.

## 2021-10-26 ENCOUNTER — Ambulatory Visit: Payer: PPO | Admitting: Family Medicine

## 2021-10-27 ENCOUNTER — Other Ambulatory Visit: Payer: Self-pay | Admitting: Rheumatology

## 2021-11-01 ENCOUNTER — Other Ambulatory Visit (INDEPENDENT_AMBULATORY_CARE_PROVIDER_SITE_OTHER): Payer: PPO

## 2021-11-01 DIAGNOSIS — E559 Vitamin D deficiency, unspecified: Secondary | ICD-10-CM | POA: Diagnosis not present

## 2021-11-01 DIAGNOSIS — E785 Hyperlipidemia, unspecified: Secondary | ICD-10-CM

## 2021-11-01 DIAGNOSIS — E034 Atrophy of thyroid (acquired): Secondary | ICD-10-CM | POA: Diagnosis not present

## 2021-11-01 LAB — CBC WITH DIFFERENTIAL/PLATELET
Basophils Absolute: 0.1 10*3/uL (ref 0.0–0.1)
Basophils Relative: 0.8 % (ref 0.0–3.0)
Eosinophils Absolute: 0.1 10*3/uL (ref 0.0–0.7)
Eosinophils Relative: 0.9 % (ref 0.0–5.0)
HCT: 40.8 % (ref 36.0–46.0)
Hemoglobin: 13.5 g/dL (ref 12.0–15.0)
Lymphocytes Relative: 11.7 % — ABNORMAL LOW (ref 12.0–46.0)
Lymphs Abs: 0.9 10*3/uL (ref 0.7–4.0)
MCHC: 33.1 g/dL (ref 30.0–36.0)
MCV: 90.8 fl (ref 78.0–100.0)
Monocytes Absolute: 0.5 10*3/uL (ref 0.1–1.0)
Monocytes Relative: 6.3 % (ref 3.0–12.0)
Neutro Abs: 6.3 10*3/uL (ref 1.4–7.7)
Neutrophils Relative %: 80.3 % — ABNORMAL HIGH (ref 43.0–77.0)
Platelets: 241 10*3/uL (ref 150.0–400.0)
RBC: 4.49 Mil/uL (ref 3.87–5.11)
RDW: 14.3 % (ref 11.5–15.5)
WBC: 7.8 10*3/uL (ref 4.0–10.5)

## 2021-11-01 LAB — COMPREHENSIVE METABOLIC PANEL
ALT: 13 U/L (ref 0–35)
AST: 13 U/L (ref 0–37)
Albumin: 3.6 g/dL (ref 3.5–5.2)
Alkaline Phosphatase: 93 U/L (ref 39–117)
BUN: 16 mg/dL (ref 6–23)
CO2: 28 mEq/L (ref 19–32)
Calcium: 9.2 mg/dL (ref 8.4–10.5)
Chloride: 104 mEq/L (ref 96–112)
Creatinine, Ser: 0.84 mg/dL (ref 0.40–1.20)
GFR: 69.54 mL/min (ref >60.00)
Glucose, Bld: 100 mg/dL — ABNORMAL HIGH (ref 70–99)
Potassium: 4.2 mEq/L (ref 3.5–5.1)
Sodium: 140 mEq/L (ref 135–145)
Total Bilirubin: 0.6 mg/dL (ref 0.2–1.2)
Total Protein: 6.4 g/dL (ref 6.0–8.3)

## 2021-11-01 LAB — LIPID PANEL
Cholesterol: 167 mg/dL (ref 0–200)
HDL: 74.9 mg/dL (ref >39.00)
LDL Cholesterol: 81 mg/dL (ref 0–99)
NonHDL: 92.3
Total CHOL/HDL Ratio: 2
Triglycerides: 57 mg/dL (ref 0.0–149.0)
VLDL: 11.4 mg/dL (ref 0.0–40.0)

## 2021-11-01 LAB — VITAMIN D 25 HYDROXY (VIT D DEFICIENCY, FRACTURES): VITD: 50.24 ng/mL (ref 30.00–100.00)

## 2021-11-01 LAB — TSH: TSH: 0.05 u[IU]/mL — ABNORMAL LOW (ref 0.35–5.50)

## 2021-11-02 ENCOUNTER — Other Ambulatory Visit: Payer: Self-pay | Admitting: Family Medicine

## 2021-11-03 ENCOUNTER — Encounter: Payer: Self-pay | Admitting: Family Medicine

## 2021-11-03 ENCOUNTER — Other Ambulatory Visit: Payer: Self-pay

## 2021-11-03 DIAGNOSIS — E034 Atrophy of thyroid (acquired): Secondary | ICD-10-CM

## 2021-11-03 MED ORDER — LEVOTHYROXINE SODIUM 88 MCG PO TABS: 88.0000 ug | ORAL_TABLET | Freq: Every day | ORAL | 3 refills | Status: DC

## 2021-11-07 NOTE — Progress Notes (Signed)
[name] - 71 y.o. female MRN [id number]  Date of birth: 12/18/1950  Office Visit Note: Visit Date: 10/25/2021 PCP: [name], MD Referred by: Marybelle Killings, MD  Subjective: Chief Complaint  Patient presents with   Lower Back - Pain   HPI:  [name] is a 71 y.o. female who comes in today at the request of Dr. Rodell Perna for planned Left L2-3 Lumbar Interlaminar epidural steroid injection with fluoroscopic guidance.  The patient has failed conservative care including home exercise, medications, time and activity modification.  This injection will be diagnostic and hopefully therapeutic.  Please see requesting physician notes for further details and justification. Pain in the upper lumbar region.   ROS Otherwise per HPI.  Assessment & Plan: Visit Diagnoses:    ICD-10-CM   1. Lumbar radiculopathy  M54.16 XR C-ARM NO REPORT    Epidural Steroid injection    methylPREDNISolone acetate (DEPO-MEDROL) injection 40 mg      Plan: No additional findings.   Meds & Orders:  Meds ordered this encounter  Medications   methylPREDNISolone acetate (DEPO-MEDROL) injection 40 mg    Orders Placed This Encounter  Procedures   XR C-ARM NO REPORT   Epidural Steroid injection    Follow-up: Return if symptoms worsen or fail to improve.   Procedures: No procedures performed  Lumbar Epidural Steroid Injection - Interlaminar Approach with Fluoroscopic Guidance  Patient: [name]      Date of Birth: 14-Oct-1950 MRN: [id number] PCP: [name], MD      Visit Date: 10/25/2021   Universal Protocol:     Consent Given By: the patient  Position: PRONE  Additional Comments: Vital signs were monitored before and after the procedure. Patient was prepped and draped in the usual sterile fashion. The correct patient, procedure, and site was verified.   Injection Procedure Details:   Procedure diagnoses: Lumbar radiculopathy [M54.16]   Meds Administered:  Meds  ordered this encounter  Medications   methylPREDNISolone acetate (DEPO-MEDROL) injection 40 mg     Laterality: Left  Location/Site:  L2-3  Needle: 3.5 in., 20 ga. Tuohy  Needle Placement: Paramedian epidural  Findings:   -Comments: Excellent flow of contrast into the epidural space.  Procedure Details: Using a paramedian approach from the side mentioned above, the region overlying the inferior lamina was localized under fluoroscopic visualization and the soft tissues overlying this structure were infiltrated with 4 ml. of 1% Lidocaine without Epinephrine. The Tuohy needle was inserted into the epidural space using a paramedian approach.   The epidural space was localized using loss of resistance along with counter oblique bi-planar fluoroscopic views.  After negative aspirate for air, blood, and CSF, a 2 ml. volume of Isovue-250 was injected into the epidural space and the flow of contrast was observed. Radiographs were obtained for documentation purposes.    The injectate was administered into the level noted above.   Additional Comments:  The patient tolerated the procedure well Dressing: 2 x 2 sterile gauze and Band-Aid    Post-procedure details: Patient was observed during the procedure. Post-procedure instructions were reviewed.  Patient left the clinic in stable condition.   Clinical History: MRI LUMBAR SPINE WITHOUT CONTRAST   TECHNIQUE: Multiplanar, multisequence MR imaging of the lumbar spine was performed. No intravenous contrast was administered.   COMPARISON:  MRI of the lumbar spine June 12, 2014   FINDINGS: Segmentation:  Standard.   Alignment:  There is a levoscoliosis of the lumbar  spine.   Vertebrae: Chronic fracture of the L1 vertebral body with loss of approximately 60% of the vertebral body height with mild retropulsion of the superior aspect of the posterior wall into the spinal canal without significant spinal canal stenosis.   Marrow edema  is seen in the is superior articular process of L5 on the left side related to degenerative changes.   No acute fracture, evidence of discitis, or bone lesion.   Conus medullaris and cauda equina: Conus extends to the T12-L1 level. Conus and cauda equina appear normal.   Paraspinal and other soft tissues: Negative.   Disc levels:   T12-L1: Small retropulsion of the posterior wall into the spinal canal without significant spinal canal stenosis. Perineural cysts noted on the right side. There is no neural foraminal stenosis.   L1-2: No spinal canal or neural foraminal stenosis.   L2-3: Shallow disc bulge resulting in mild bilateral neural foraminal narrowing. No spinal canal stenosis.   L3-4: Shallow disc bulge and mild facet degenerative changes with ligamentum flavum redundancy resulting in mild left neural foraminal narrowing. No spinal canal stenosis.   L4-5: Disc bulge with superimposed left central disc protrusion, facet degenerative changes with mild ligamentum flavum redundancy resulting in mild spinal canal stenosis with narrowing of the left subarticular zone, mild right and moderate left neural foraminal narrowing.   L5-S1: Facet degenerative changes, left greater than right. No spinal canal or neural foraminal stenosis.   IMPRESSION: 1. No acute fracture of the lumbar spine. 2. Chronic L1 compression fracture with loss of approximately 60% of vertebral body height with mild retropulsion of the posterior wall into the spinal canal without significant spinal canal stenosis. 3. Multilevel degenerative changes of the lumbar spine, worst at L4-L5 with mild spinal canal stenosis, narrowing of the left subarticular zone, mild right and moderate left neural foraminal narrowing.     Electronically Signed   By: Pedro Earls M.D.   On: 04/24/2019 11:13     Objective:  VS:  HT:5' 1.5" (156.2 cm)   WT:156 lb (70.8 kg)  BMI:29    BP:(!) 148/87  HR:  bpm  TEMP: ( )  RESP:  Physical Exam Vitals and nursing note reviewed.  Constitutional:      General: She is not in acute distress.    Appearance: Normal appearance. She is not ill-appearing.  HENT:     Head: Normocephalic and atraumatic.     Right Ear: External ear normal.     Left Ear: External ear normal.  Eyes:     Extraocular Movements: Extraocular movements intact.  Cardiovascular:     Rate and Rhythm: Normal rate.     Pulses: Normal pulses.  Pulmonary:     Effort: Pulmonary effort is normal. No respiratory distress.  Abdominal:     General: There is no distension.     Palpations: Abdomen is soft.  Musculoskeletal:        General: Tenderness present.     Cervical back: Neck supple.     Right lower leg: No edema.     Left lower leg: No edema.     Comments: Patient has good distal strength with no pain over the greater trochanters.  No clonus or focal weakness.  Skin:    Findings: No erythema, lesion or rash.  Neurological:     General: No focal deficit present.     Mental Status: She is alert and oriented to person, place, and time.     Sensory: No  sensory deficit.     Motor: No weakness or abnormal muscle tone.     Coordination: Coordination normal.  Psychiatric:        Mood and Affect: Mood normal.        Behavior: Behavior normal.      Imaging: No results found.

## 2021-11-07 NOTE — Procedures (Signed)
Lumbar Epidural Steroid Injection - Interlaminar Approach with Fluoroscopic Guidance  Patient: [name]      Date of Birth: 01/17/1951 MRN: [id number] PCP: [name], MD      Visit Date: 10/25/2021   Universal Protocol:     Consent Given By: the patient  Position: PRONE  Additional Comments: Vital signs were monitored before and after the procedure. Patient was prepped and draped in the usual sterile fashion. The correct patient, procedure, and site was verified.   Injection Procedure Details:   Procedure diagnoses: Lumbar radiculopathy [M54.16]   Meds Administered:  Meds ordered this encounter  Medications   methylPREDNISolone acetate (DEPO-MEDROL) injection 40 mg     Laterality: Left  Location/Site:  L2-3  Needle: 3.5 in., 20 ga. Tuohy  Needle Placement: Paramedian epidural  Findings:   -Comments: Excellent flow of contrast into the epidural space.  Procedure Details: Using a paramedian approach from the side mentioned above, the region overlying the inferior lamina was localized under fluoroscopic visualization and the soft tissues overlying this structure were infiltrated with 4 ml. of 1% Lidocaine without Epinephrine. The Tuohy needle was inserted into the epidural space using a paramedian approach.   The epidural space was localized using loss of resistance along with counter oblique bi-planar fluoroscopic views.  After negative aspirate for air, blood, and CSF, a 2 ml. volume of Isovue-250 was injected into the epidural space and the flow of contrast was observed. Radiographs were obtained for documentation purposes.    The injectate was administered into the level noted above.   Additional Comments:  The patient tolerated the procedure well Dressing: 2 x 2 sterile gauze and Band-Aid    Post-procedure details: Patient was observed during the procedure. Post-procedure instructions were reviewed.  Patient left the clinic in stable  condition.

## 2021-11-09 NOTE — Progress Notes (Deleted)
Office Visit Note  Patient: [name]             Date of Birth: 1950-04-09           MRN: [id number]             PCP: [name], MD Referring: [name], MD Visit Date: 11/23/2021 Occupation: '@GUAROCC'$ @  Subjective:  No chief complaint on file.   History of Present Illness: [name] is a 71 y.o. female ***   Activities of Daily Living:  Patient reports morning stiffness for *** {minute/hour:19697}.   Patient {ACTIONS;DENIES/REPORTS:21021675::"Denies"} nocturnal pain.  Difficulty dressing/grooming: {ACTIONS;DENIES/REPORTS:21021675::"Denies"} Difficulty climbing stairs: {ACTIONS;DENIES/REPORTS:21021675::"Denies"} Difficulty getting out of chair: {ACTIONS;DENIES/REPORTS:21021675::"Denies"} Difficulty using hands for taps, buttons, cutlery, and/or writing: {ACTIONS;DENIES/REPORTS:21021675::"Denies"}  No Rheumatology ROS completed.   PMFS History:  Patient Active Problem List   Diagnosis Date Noted   Unilateral primary osteoarthritis, left hip 08/24/2021   Shoulder fracture 08/10/2020   Lumbar compression fracture ([place]) 05/11/2019   Dyslipidemia 01/26/2019   Drug-induced myopathy 01/14/2019   Haglund's deformity 01/12/2019   High risk medication use 12/04/2018   Lower esophageal ring ([place]) 03/03/2018   Osteoarthritis of right hip 12/02/2017   GERD (gastroesophageal reflux disease) 12/15/2015   Polyp of colon, adenomatous 10/11/2014   Vitamin D deficiency 05/26/2014   Osteoporosis 05/26/2014   Polymyalgia rheumatica ([place]) 12/01/2010   CAD (coronary artery disease) 09/09/2007   Hematuria 09/09/2007   Depression 03/20/2007   Hypothyroid 02/13/1999    Past Medical History:  Diagnosis Date   Anxiety    Arthritis    CAD (coronary artery disease)    Cataract    Depression    DISORDER, MENOPAUSAL NOS 03/20/2007   Qualifier: Diagnosis of  By: Arnoldo Morale MD, Balinda Quails    GERD (gastroesophageal reflux disease)    Hyperlipidemia    Hypothyroidism     MYOCARDIAL INFARCTION, HX OF 09/09/2007   Qualifier: Diagnosis of  By: Leanne Chang MD, Bruce     Osteoporosis    Polymyalgia rheumatica ([place])     Family History  Problem Relation Age of Onset   Hypertension Father    Heart disease Father        Mi age 63, 60, 28 and 25.   Cancer Father    Hyperlipidemia Father    Colon cancer Maternal Uncle    Hypertension Mother    Cancer Mother    Hyperlipidemia Mother    Pulmonary fibrosis Sister    Epilepsy Son    Healthy Son    Myasthenia gravis Son    Healthy Son    Rheum arthritis Niece    Lupus Niece    Rheum arthritis Niece    Past Surgical History:  Procedure Laterality Date   CATARACT EXTRACTION     CESAREAN SECTION     CHOLECYSTECTOMY N/A 11/24/2012   Procedure: LAPAROSCOPIC CHOLECYSTECTOMY;  Surgeon: Ralene Ok, MD;  Location: [facility];  Service: General;  Laterality: N/A;   COSMETIC SURGERY     EYE SURGERY     eye lids lifted   PTCA     REVERSE SHOULDER ARTHROPLASTY Right 08/12/2020   Procedure: REVERSE SHOULDER ARTHROPLASTY;  Surgeon: Nicholes Stairs, MD;  Location: [facility];  Service: Orthopedics;  Laterality: Right;   Social History   Social History Narrative   Husband and 2 adult children live at home. No grandkids. Son with epilepsy.      Retired at age 52    [place]- at [employer].  Immunization History  Administered Date(s) Administered   Fluad Quad(high Dose 65+) 11/10/2019, 11/21/2020   Influenza Whole 11/09/2008   Influenza, High Dose Seasonal PF 10/30/2016, 12/02/2017, 11/08/2018   Influenza-Unspecified 11/08/2018   PFIZER(Purple Top)SARS-COV-2 Vaccination 02/26/2019, 03/20/2019, 11/28/2019   PNEUMOCOCCAL CONJUGATE-20 06/21/2020   Pfizer Covid-19 Vaccine Bivalent Booster 76yr & up 11/21/2020, 07/22/2021   Pneumococcal Conjugate-13 10/11/2014   Pneumococcal Polysaccharide-23 12/15/2015   Td 02/12/2006   Tdap 04/26/2017   Zoster Recombinat (Shingrix) 07/18/2021, 09/19/2021   Zoster, Live 09/19/2010      Objective: Vital Signs: There were no vitals taken for this visit.   Physical Exam   Musculoskeletal Exam: ***  CDAI Exam: CDAI Score: -- Patient Global: --; Provider Global: -- Swollen: --; Tender: -- Joint Exam 11/23/2021   No joint exam has been documented for this visit   There is currently no information documented on the homunculus. Go to the Rheumatology activity and complete the homunculus joint exam.  Investigation: No additional findings.  Imaging: XR C-ARM NO REPORT  Result Date: 10/25/2021 Please see Notes tab for imaging impression.  Epidural Steroid injection  Result Date: 10/25/2021 NMagnus Sinning MD     11/07/2021  5:13 PM Lumbar Epidural Steroid Injection - Interlaminar Approach with Fluoroscopic Guidance Patient: [name]    Date of Birth: 7May 19, 1952MRN: [id number]: HMarin Olp MD     Visit Date: 10/25/2021  Universal Protocol:   Consent Given By: the patient Position: PRONE Additional Comments: Vital signs were monitored before and after the procedure. Patient was prepped and draped in the usual sterile fashion. The correct patient, procedure, and site was verified. Injection Procedure Details: Procedure diagnoses: Lumbar radiculopathy [M54.16] Meds Administered: Meds ordered this encounter Medications  methylPREDNISolone acetate (DEPO-MEDROL) injection 40 mg  Laterality: Left Location/Site:  L2-3 Needle: 3.5 in., 20 ga. Tuohy Needle Placement: Paramedian epidural Findings:  -Comments: Excellent flow of contrast into the epidural space. Procedure Details: Using a paramedian approach from the side mentioned above, the region overlying the inferior lamina was localized under fluoroscopic visualization and the soft tissues overlying this structure were infiltrated with 4 ml. of 1% Lidocaine without Epinephrine. The Tuohy needle was inserted into the epidural space using a paramedian approach. The epidural space was localized using loss of  resistance along with counter oblique bi-planar fluoroscopic views.  After negative aspirate for air, blood, and CSF, a 2 ml. volume of Isovue-250 was injected into the epidural space and the flow of contrast was observed. Radiographs were obtained for documentation purposes.  The injectate was administered into the level noted above. Additional Comments: The patient tolerated the procedure well Dressing: 2 x 2 sterile gauze and Band-Aid  Post-procedure details: Patient was observed during the procedure. Post-procedure instructions were reviewed. Patient left the clinic in stable condition.   Recent Labs: Lab Results  Component Value Date   WBC 7.8 11/01/2021   HGB 13.5 11/01/2021   PLT 241.0 11/01/2021   NA 140 11/01/2021   K 4.2 11/01/2021   CL 104 11/01/2021   CO2 28 11/01/2021   GLUCOSE 100 (H) 11/01/2021   BUN 16 11/01/2021   CREATININE 0.84 11/01/2021   BILITOT 0.6 11/01/2021   ALKPHOS 93 11/01/2021   AST 13 11/01/2021   ALT 13 11/01/2021   PROT 6.4 11/01/2021   ALBUMIN 3.6 11/01/2021   CALCIUM 9.2 11/01/2021   GFRAA 75 04/22/2020   QFTBGOLDPLUS NEGATIVE 08/12/2018    Speciality Comments: Forteo October 2021-February 2022 restarted June 2022 (missed  3 weeks due to fracture)  Procedures:  No procedures performed Allergies: Morphine and related, Prochlorperazine, Simvastatin, and Sulfa antibiotics   Assessment / Plan:     Visit Diagnoses: No diagnosis found.  Orders: No orders of the defined types were placed in this encounter.  No orders of the defined types were placed in this encounter.   Face-to-face time spent with patient was *** minutes. Greater than 50% of time was spent in counseling and coordination of care.  Follow-Up Instructions: No follow-ups on file.   Earnestine Mealing, CMA  Note - This record has been created using Editor, commissioning.  Chart creation errors have been sought, but may not always  have been located. Such creation errors do not reflect  on  the standard of medical care.

## 2021-11-23 ENCOUNTER — Ambulatory Visit: Payer: PPO | Admitting: Rheumatology

## 2021-11-23 DIAGNOSIS — M353 Polymyalgia rheumatica: Secondary | ICD-10-CM

## 2021-11-23 DIAGNOSIS — Z79899 Other long term (current) drug therapy: Secondary | ICD-10-CM

## 2021-11-23 DIAGNOSIS — E559 Vitamin D deficiency, unspecified: Secondary | ICD-10-CM

## 2021-11-23 DIAGNOSIS — Z7952 Long term (current) use of systemic steroids: Secondary | ICD-10-CM

## 2021-11-23 DIAGNOSIS — M19071 Primary osteoarthritis, right ankle and foot: Secondary | ICD-10-CM

## 2021-11-23 DIAGNOSIS — Z8659 Personal history of other mental and behavioral disorders: Secondary | ICD-10-CM

## 2021-11-23 DIAGNOSIS — I2510 Atherosclerotic heart disease of native coronary artery without angina pectoris: Secondary | ICD-10-CM

## 2021-11-23 DIAGNOSIS — M19041 Primary osteoarthritis, right hand: Secondary | ICD-10-CM

## 2021-11-23 DIAGNOSIS — Z8639 Personal history of other endocrine, nutritional and metabolic disease: Secondary | ICD-10-CM

## 2021-11-23 DIAGNOSIS — Z96611 Presence of right artificial shoulder joint: Secondary | ICD-10-CM

## 2021-11-23 DIAGNOSIS — M7062 Trochanteric bursitis, left hip: Secondary | ICD-10-CM

## 2021-11-23 DIAGNOSIS — M810 Age-related osteoporosis without current pathological fracture: Secondary | ICD-10-CM

## 2021-11-23 DIAGNOSIS — Z8719 Personal history of other diseases of the digestive system: Secondary | ICD-10-CM

## 2021-11-23 DIAGNOSIS — D126 Benign neoplasm of colon, unspecified: Secondary | ICD-10-CM

## 2021-11-23 DIAGNOSIS — Z8781 Personal history of (healed) traumatic fracture: Secondary | ICD-10-CM

## 2021-11-23 DIAGNOSIS — M5136 Other intervertebral disc degeneration, lumbar region: Secondary | ICD-10-CM

## 2021-11-23 DIAGNOSIS — M25552 Pain in left hip: Secondary | ICD-10-CM

## 2021-11-27 DIAGNOSIS — L821 Other seborrheic keratosis: Secondary | ICD-10-CM | POA: Diagnosis not present

## 2021-11-27 DIAGNOSIS — D485 Neoplasm of uncertain behavior of skin: Secondary | ICD-10-CM | POA: Diagnosis not present

## 2021-11-27 DIAGNOSIS — D2262 Melanocytic nevi of left upper limb, including shoulder: Secondary | ICD-10-CM | POA: Diagnosis not present

## 2021-11-27 DIAGNOSIS — D225 Melanocytic nevi of trunk: Secondary | ICD-10-CM | POA: Diagnosis not present

## 2021-12-11 ENCOUNTER — Other Ambulatory Visit: Payer: Self-pay | Admitting: Physician Assistant

## 2021-12-12 NOTE — Telephone Encounter (Signed)
Next Visit: 10/25/2021   Last Visit: 08/09/2021   Last Fill: 09/11/2021   Dx: Polymyalgia rheumatica    Current Dose per office note on 08/09/2021: Prednisone 5 mg p.o. daily.   Okay to refill Prednisone?

## 2021-12-13 ENCOUNTER — Telehealth: Payer: Self-pay | Admitting: Pharmacist

## 2021-12-13 ENCOUNTER — Encounter: Payer: Self-pay | Admitting: Family Medicine

## 2021-12-13 NOTE — Telephone Encounter (Signed)
Received fax from [facility] that they are accepting [phone] renewal applications for patient assistance program.   Per chart review, patient received Forteo from October 2021-February 2022 (~5 months?) then restarted it in June 2022 (missed 3 weeks due to fracture) (patient should be completed Forteo treatment this year).  Will not need need PAP renewal through [employer]. Letter send to media tab of patient's chart.  Knox Saliva, PharmD, MPH, BCPS, CPP Clinical Pharmacist (Rheumatology and Pulmonology)

## 2021-12-21 ENCOUNTER — Other Ambulatory Visit (INDEPENDENT_AMBULATORY_CARE_PROVIDER_SITE_OTHER): Payer: PPO

## 2021-12-21 DIAGNOSIS — E034 Atrophy of thyroid (acquired): Secondary | ICD-10-CM | POA: Diagnosis not present

## 2021-12-21 LAB — TSH: TSH: 14.87 u[IU]/mL — ABNORMAL HIGH (ref 0.35–5.50)

## 2021-12-24 ENCOUNTER — Encounter: Payer: Self-pay | Admitting: Family Medicine

## 2021-12-25 ENCOUNTER — Encounter: Payer: Self-pay | Admitting: Family Medicine

## 2021-12-26 NOTE — Progress Notes (Signed)
Office Visit Note  Patient: [name]             Date of Birth: 26-Feb-1950           MRN: [id number]             PCP: [name], MD Referring: [name], MD Visit Date: 01/09/2022 Occupation: '@GUAROCC'$ @  Subjective:  Medication management  History of Present Illness: CHAR FELTMAN is a 71 y.o. female with history of polymyalgia rheumatica, osteoarthritis, degenerative disc disease and osteoporosis.  She continues to have pain and discomfort in her right hip and lower back.  She has had injection in her lower back by Dr. Ernestina Patches and right hip by orthopedic surgeon.  She had good response to the injections but some of the pain is coming back now.  She denies any muscular weakness or tenderness from polymyalgia rheumatica.  She has been off methotrexate for a long time.  She still takes prednisone 5 mg p.o. daily for many years.  She has been on Forteo injections for osteoporosis which will be finishing in January.  She will request repeat DEXA scan through Dr. Yong Channel.  She has been taking calcium and vitamin D and exercising on a regular basis.  Activities of Daily Living:  Patient reports morning stiffness for 1 hour.   Patient Reports nocturnal pain.  Difficulty dressing/grooming: Denies Difficulty climbing stairs: Denies Difficulty getting out of chair: Denies Difficulty using hands for taps, buttons, cutlery, and/or writing: Denies  Review of Systems  Constitutional:  Positive for fatigue.  HENT:  Negative for mouth sores and mouth dryness.   Eyes:  Negative for dryness.  Respiratory:  Negative for shortness of breath.   Cardiovascular:  Negative for chest pain and palpitations.  Gastrointestinal:  Negative for blood in stool, constipation and diarrhea.  Endocrine: Negative for increased urination.  Genitourinary:  Negative for involuntary urination.  Musculoskeletal:  Positive for myalgias, morning stiffness, muscle tenderness and myalgias. Negative for joint  pain, gait problem, joint pain, joint swelling and muscle weakness.  Skin:  Negative for color change, rash, hair loss and sensitivity to sunlight.  Allergic/Immunologic: Negative for susceptible to infections.  Neurological:  Negative for dizziness and headaches.  Hematological:  Negative for swollen glands.  Psychiatric/Behavioral:  Negative for depressed mood and sleep disturbance. The patient is not nervous/anxious.     PMFS History:  Patient Active Problem List   Diagnosis Date Noted   Unilateral primary osteoarthritis, left hip 08/24/2021   Shoulder fracture 08/10/2020   Lumbar compression fracture ([place]) 05/11/2019   Dyslipidemia 01/26/2019   Drug-induced myopathy 01/14/2019   Haglund's deformity 01/12/2019   High risk medication use 12/04/2018   Lower esophageal ring ([place]) 03/03/2018   Osteoarthritis of right hip 12/02/2017   GERD (gastroesophageal reflux disease) 12/15/2015   Polyp of colon, adenomatous 10/11/2014   Vitamin D deficiency 05/26/2014   Osteoporosis 05/26/2014   Polymyalgia rheumatica ([place]) 12/01/2010   CAD (coronary artery disease) 09/09/2007   Hematuria 09/09/2007   Depression 03/20/2007   Hypothyroid 02/13/1999    Past Medical History:  Diagnosis Date   Anxiety    Arthritis    CAD (coronary artery disease)    Cataract    Depression    DISORDER, MENOPAUSAL NOS 03/20/2007   Qualifier: Diagnosis of  By: Arnoldo Morale MD, Balinda Quails    GERD (gastroesophageal reflux disease)    Hyperlipidemia    Hypothyroidism    MYOCARDIAL INFARCTION, HX OF 09/09/2007   Qualifier:  Diagnosis of  By: Leanne Chang MD, Bruce     Osteoporosis    Polymyalgia rheumatica [facility])     Family History  Problem Relation Age of Onset   Hypertension Father    Heart disease Father        Mi age 71, 41, 56 and 68.   Cancer Father    Hyperlipidemia Father    Colon cancer Maternal Uncle    Hypertension Mother    Cancer Mother    Hyperlipidemia Mother    Pulmonary fibrosis Sister     Epilepsy Son    Healthy Son    Myasthenia gravis Son    Healthy Son    Rheum arthritis Niece    Lupus Niece    Rheum arthritis Niece    Past Surgical History:  Procedure Laterality Date   CATARACT EXTRACTION     CESAREAN SECTION     CHOLECYSTECTOMY N/A 11/24/2012   Procedure: LAPAROSCOPIC CHOLECYSTECTOMY;  Surgeon: Ralene Ok, MD;  Location: [facility];  Service: General;  Laterality: N/A;   COSMETIC SURGERY     EYE SURGERY     eye lids lifted   PTCA     REVERSE SHOULDER ARTHROPLASTY Right 08/12/2020   Procedure: REVERSE SHOULDER ARTHROPLASTY;  Surgeon: Nicholes Stairs, MD;  Location: [facility];  Service: Orthopedics;  Laterality: Right;   Social History   Social History Narrative   Husband and 2 adult children live at home. No grandkids. Son with epilepsy.      Retired at age 35    [place]- at [employer].     Immunization History  Administered Date(s) Administered   Fluad Quad(high Dose 65+) 11/10/2019, 11/21/2020, 11/28/2021   Influenza Whole 11/09/2008   Influenza, High Dose Seasonal PF 10/30/2016, 12/02/2017, 11/08/2018   Influenza-Unspecified 11/08/2018   PFIZER Comirnaty(Gray Top)Covid-19 Tri-Sucrose Vaccine 11/16/2021   PFIZER(Purple Top)SARS-COV-2 Vaccination 02/26/2019, 03/20/2019, 11/28/2019   PNEUMOCOCCAL CONJUGATE-20 06/21/2020   Pfizer Covid-19 Vaccine Bivalent Booster 65yr & up 11/21/2020, 07/22/2021   Pneumococcal Conjugate-13 10/11/2014   Pneumococcal Polysaccharide-23 12/15/2015   Rsv, Bivalent, Protein Subunit Rsvpref,pf ([place] 11/16/2021   Td 02/12/2006   Td (Adult), 2 Lf Tetanus Toxid, Preservative Free 02/12/2006   Tdap 04/26/2017   Zoster Recombinat (Shingrix) 07/18/2021, 09/19/2021   Zoster, Live 09/19/2010     Objective: Vital Signs: BP (!) 155/88 (BP Location: Left Arm, Patient Position: Sitting, Cuff Size: Normal)   Pulse 67   Resp 15   Ht 5' 1.5" (1.562 m)   Wt 162 lb (73.5 kg)   BMI 30.11 kg/m    Physical Exam Vitals and  nursing note reviewed.  Constitutional:      Appearance: She is well-developed.  HENT:     Head: Normocephalic and atraumatic.  Eyes:     Conjunctiva/sclera: Conjunctivae normal.  Cardiovascular:     Rate and Rhythm: Normal rate and regular rhythm.     Heart sounds: Normal heart sounds.  Pulmonary:     Effort: Pulmonary effort is normal.     Breath sounds: Normal breath sounds.  Abdominal:     General: Bowel sounds are normal.     Palpations: Abdomen is soft.  Musculoskeletal:     Cervical back: Normal range of motion.  Lymphadenopathy:     Cervical: No cervical adenopathy.  Skin:    General: Skin is warm and dry.     Capillary Refill: Capillary refill takes less than 2 seconds.  Neurological:     Mental Status: She is alert and oriented to person,  place, and time.  Psychiatric:        Behavior: Behavior normal.      Musculoskeletal Exam: Limited lateral rotation of the cervical spine was noted.  Thoracic kyphosis was noted.  She had no point tenderness over thoracic or lumbar region.  Right shoulder abduction was limited to about 90 degrees.  Right shoulder joint was replaced.  Left shoulder joint was in good range of motion.  Elbow joints and wrist joints with good range of motion.  She had bilateral PIP and DIP thickening with no inflammation.  She had limited range of motion of bilateral hip joints with some discomfort.  She good range of motion of her knee joints without any warmth swelling or effusion.  There was no tenderness over ankles or MTPs.  CDAI Exam: CDAI Score: -- Patient Global: --; Provider Global: -- Swollen: --; Tender: -- Joint Exam 01/09/2022   No joint exam has been documented for this visit   There is currently no information documented on the homunculus. Go to the Rheumatology activity and complete the homunculus joint exam.  Investigation: No additional findings.  Imaging: No results found.  Recent Labs: Lab Results  Component Value Date    WBC 7.8 11/01/2021   HGB 13.5 11/01/2021   PLT 241.0 11/01/2021   NA 140 11/01/2021   K 4.2 11/01/2021   CL 104 11/01/2021   CO2 28 11/01/2021   GLUCOSE 100 (H) 11/01/2021   BUN 16 11/01/2021   CREATININE 0.84 11/01/2021   BILITOT 0.6 11/01/2021   ALKPHOS 93 11/01/2021   AST 13 11/01/2021   ALT 13 11/01/2021   PROT 6.4 11/01/2021   ALBUMIN 3.6 11/01/2021   CALCIUM 9.2 11/01/2021   GFRAA 75 04/22/2020   QFTBGOLDPLUS NEGATIVE 08/12/2018    Speciality Comments: Forteo October 2021-February 2022 restarted June 2022 (missed 3 weeks due to fracture)  Should be completing Forteo in December 2023  Procedures:  No procedures performed Allergies: Morphine and related, Prochlorperazine, Simvastatin, and Sulfa antibiotics   Assessment / Plan:     Visit Diagnoses: Polymyalgia rheumatica ([place]) - DXd 2015 by another rheumatologist.  Patient has been on prednisone 5 mg p.o. daily for many years.  She had no muscular weakness or tenderness.  I discussed tapering prednisone but she is quite hesitant to lower the prednisone.  Long term (current) use of systemic steroids - Prednisone 5 mg p.o. daily.  She was unable to taper prednisone in the past.  Effects of long-term use of steroids including hypertension, diabetes, weight gain, osteoporosis, skin thinning, AVN, hyperlipidemia were discussed at length.  High risk medication use - She discontinued methotrexate in April 2022 as she did not notice any benefit.  S/P shoulder replacement, right-she had limited range of motion of her right shoulder joint without discomfort.  Left shoulder joint was in full range of motion.  Primary osteoarthritis of both hands-she had bilateral PIP and DIP thickening without synovitis.  Trochanteric bursitis, left hip-resolved.  She had injection in her left trochanteric bursa at the last visit in June 2023.  Pain in left hip - The x-rays showed spurring of the acetabulum which may be causing impingement.  She is  followed by Dr. Inda Merlin.  Primary osteoarthritis of both feet-proper fitting shoes were advised.  DDD (degenerative disc disease), lumbar - She is followed by Dr. Lorin Mercy.  Core strengthening exercises were discussed.  Age-related osteoporosis without current pathological fracture - June 08, 2019 T score -2.5 left femoral neck, BMD 0.575% change 1%.  Forteo started October 2021 (gap February 22 till June 22).  Patient states she will be finishing Forteo in January.  I advised her to contact Dr. Yong Channel for a follow-up DEXA scan.  She will need treatment either IV Reclast or subcutaneous Prolia to maintain her BMD.  History of compression fracture of spine - L1 compression fracture with 60% vertebral body height loss.  She will need aggressive therapy after finishing Forteo.  Vitamin D deficiency-vitamin D was 50.24 on November 01, 2021..  Use of vitamin D on a regular basis was advised.  History of gastroesophageal reflux (GERD)-she would not be able to take oral bisphosphonates.  Coronary artery disease involving native coronary artery of native heart without angina pectoris  Adenomatous polyp of colon, unspecified part of colon  History of hypothyroidism  History of depression  Orders: No orders of the defined types were placed in this encounter.  No orders of the defined types were placed in this encounter.    Follow-Up Instructions: Return in about 3 months (around 04/11/2022) for PMR, OA OP.   Bo Merino, MD  Note - This record has been created using Editor, commissioning.  Chart creation errors have been sought, but may not always  have been located. Such creation errors do not reflect on  the standard of medical care.

## 2021-12-27 ENCOUNTER — Other Ambulatory Visit: Payer: Self-pay

## 2021-12-27 DIAGNOSIS — E034 Atrophy of thyroid (acquired): Secondary | ICD-10-CM

## 2021-12-27 MED ORDER — LEVOTHYROXINE SODIUM 100 MCG PO TABS: 100.0000 ug | ORAL_TABLET | Freq: Every day | ORAL | 0 refills | Status: DC

## 2022-01-09 ENCOUNTER — Ambulatory Visit: Payer: PPO | Attending: Rheumatology | Admitting: Rheumatology

## 2022-01-09 ENCOUNTER — Encounter: Payer: Self-pay | Admitting: Rheumatology

## 2022-01-09 VITALS — BP 155/88 | HR 67 | Resp 15 | Ht 61.5 in | Wt 162.0 lb

## 2022-01-09 DIAGNOSIS — Z96611 Presence of right artificial shoulder joint: Secondary | ICD-10-CM

## 2022-01-09 DIAGNOSIS — M353 Polymyalgia rheumatica: Secondary | ICD-10-CM

## 2022-01-09 DIAGNOSIS — M810 Age-related osteoporosis without current pathological fracture: Secondary | ICD-10-CM

## 2022-01-09 DIAGNOSIS — Z79899 Other long term (current) drug therapy: Secondary | ICD-10-CM

## 2022-01-09 DIAGNOSIS — E559 Vitamin D deficiency, unspecified: Secondary | ICD-10-CM | POA: Diagnosis not present

## 2022-01-09 DIAGNOSIS — M25552 Pain in left hip: Secondary | ICD-10-CM | POA: Diagnosis not present

## 2022-01-09 DIAGNOSIS — I2510 Atherosclerotic heart disease of native coronary artery without angina pectoris: Secondary | ICD-10-CM

## 2022-01-09 DIAGNOSIS — M19041 Primary osteoarthritis, right hand: Secondary | ICD-10-CM | POA: Diagnosis not present

## 2022-01-09 DIAGNOSIS — M5136 Other intervertebral disc degeneration, lumbar region: Secondary | ICD-10-CM | POA: Diagnosis not present

## 2022-01-09 DIAGNOSIS — M7062 Trochanteric bursitis, left hip: Secondary | ICD-10-CM

## 2022-01-09 DIAGNOSIS — Z7952 Long term (current) use of systemic steroids: Secondary | ICD-10-CM | POA: Diagnosis not present

## 2022-01-09 DIAGNOSIS — Z8719 Personal history of other diseases of the digestive system: Secondary | ICD-10-CM

## 2022-01-09 DIAGNOSIS — Z8781 Personal history of (healed) traumatic fracture: Secondary | ICD-10-CM | POA: Diagnosis not present

## 2022-01-09 DIAGNOSIS — M19071 Primary osteoarthritis, right ankle and foot: Secondary | ICD-10-CM | POA: Diagnosis not present

## 2022-01-09 DIAGNOSIS — M19042 Primary osteoarthritis, left hand: Secondary | ICD-10-CM

## 2022-01-09 DIAGNOSIS — D126 Benign neoplasm of colon, unspecified: Secondary | ICD-10-CM

## 2022-01-09 DIAGNOSIS — M19072 Primary osteoarthritis, left ankle and foot: Secondary | ICD-10-CM

## 2022-01-09 DIAGNOSIS — Z8639 Personal history of other endocrine, nutritional and metabolic disease: Secondary | ICD-10-CM

## 2022-01-09 DIAGNOSIS — Z8659 Personal history of other mental and behavioral disorders: Secondary | ICD-10-CM

## 2022-01-10 ENCOUNTER — Encounter: Payer: Self-pay | Admitting: Family Medicine

## 2022-01-10 DIAGNOSIS — M810 Age-related osteoporosis without current pathological fracture: Secondary | ICD-10-CM

## 2022-01-11 ENCOUNTER — Encounter: Payer: Self-pay | Admitting: Rheumatology

## 2022-01-19 ENCOUNTER — Encounter: Payer: Self-pay | Admitting: Rheumatology

## 2022-01-22 ENCOUNTER — Encounter: Payer: Self-pay | Admitting: Rheumatology

## 2022-01-24 ENCOUNTER — Telehealth: Payer: Self-pay | Admitting: *Deleted

## 2022-01-24 NOTE — Telephone Encounter (Signed)
Received DEXA results from [facility].  Date of Scan: 08/07/2021  Lowest T-score:-2.1  BMD:0.619  Lowest site measured:Bilateral Femoral Neck  DX: Osteopenia  Significant changes in BMD and site measured (5% and above):7% for Ap L2-L4, 12 % Left Total Femur  Current Regimen: Forteo started October 2021  Recommendation:Improvement Noted. Will discuss at follow up visit.   Reviewed by:Dr. Bo Merino   Next Appointment:  04/12/2022

## 2022-01-27 ENCOUNTER — Encounter: Payer: Self-pay | Admitting: Family Medicine

## 2022-01-29 ENCOUNTER — Other Ambulatory Visit: Payer: Self-pay

## 2022-01-29 DIAGNOSIS — E034 Atrophy of thyroid (acquired): Secondary | ICD-10-CM

## 2022-01-29 MED ORDER — LEVOTHYROXINE SODIUM 100 MCG PO TABS: 100.0000 ug | ORAL_TABLET | Freq: Every day | ORAL | 0 refills | Status: DC

## 2022-01-29 MED ORDER — PAROXETINE HCL 20 MG PO TABS: 20.0000 mg | ORAL_TABLET | Freq: Every morning | ORAL | 0 refills | Status: DC

## 2022-01-29 MED ORDER — PANTOPRAZOLE SODIUM 40 MG PO TBEC: 40.0000 mg | DELAYED_RELEASE_TABLET | Freq: Every day | ORAL | 0 refills | Status: DC

## 2022-02-01 ENCOUNTER — Other Ambulatory Visit: Payer: Self-pay

## 2022-02-06 ENCOUNTER — Encounter: Payer: Self-pay | Admitting: Family Medicine

## 2022-02-06 MED ORDER — TRAZODONE HCL 50 MG PO TABS: 25.0000 mg | ORAL_TABLET | Freq: Every evening | ORAL | 3 refills | Status: DC | PRN

## 2022-02-07 ENCOUNTER — Other Ambulatory Visit (INDEPENDENT_AMBULATORY_CARE_PROVIDER_SITE_OTHER): Payer: PPO

## 2022-02-07 ENCOUNTER — Ambulatory Visit
Admission: RE | Admit: 2022-02-07 | Discharge: 2022-02-07 | Disposition: A | Discharge status: Home / Self Care | Payer: PPO | Source: Ambulatory Visit | Attending: Family Medicine | Admitting: Family Medicine

## 2022-02-07 ENCOUNTER — Encounter: Payer: Self-pay | Admitting: Family Medicine

## 2022-02-07 DIAGNOSIS — M810 Age-related osteoporosis without current pathological fracture: Secondary | ICD-10-CM

## 2022-02-07 DIAGNOSIS — E034 Atrophy of thyroid (acquired): Secondary | ICD-10-CM | POA: Diagnosis not present

## 2022-02-07 LAB — TSH: TSH: 0.86 u[IU]/mL (ref 0.35–5.50)

## 2022-02-07 NOTE — Telephone Encounter (Signed)
Patient states: -She was informed that in order to get bone density test done she needs a PA completed.  - Insurance will not pay w/o a PA due to test being done less than 2 years from last one  - She has test scheduled for the week of January 15th.

## 2022-02-08 ENCOUNTER — Encounter: Payer: Self-pay | Admitting: Rheumatology

## 2022-02-14 ENCOUNTER — Other Ambulatory Visit: Payer: PPO

## 2022-02-26 ENCOUNTER — Inpatient Hospital Stay: Admission: RE | Admit: 2022-02-26 | Payer: PPO | Source: Ambulatory Visit

## 2022-03-20 ENCOUNTER — Ambulatory Visit (INDEPENDENT_AMBULATORY_CARE_PROVIDER_SITE_OTHER): Payer: PPO

## 2022-03-20 VITALS — Wt 162.0 lb

## 2022-03-20 DIAGNOSIS — Z0000 Encounter for general adult medical examination without abnormal findings: Secondary | ICD-10-CM

## 2022-03-20 NOTE — Progress Notes (Signed)
I connected with  [name] on 03/20/22 by a audio enabled telemedicine application and verified that I am speaking with the correct person using two identifiers.  Patient Location: Home  Provider Location: Office/Clinic  I discussed the limitations of evaluation and management by telemedicine. The patient expressed understanding and agreed to proceed.   Subjective:   [name] is a 72 y.o. female who presents for Medicare Annual (Subsequent) preventive examination.  Review of Systems     Cardiac Risk Factors include: advanced age (>11mn, >>53women);dyslipidemia;obesity (BMI >30kg/m2)     Objective:    Today's Vitals   03/20/22 1643  Weight: 162 lb (73.5 kg)   Body mass index is 30.11 kg/m.     03/20/2022    4:45 PM 03/10/2021    3:28 PM 09/04/2020   12:29 PM 08/10/2020    4:32 PM 08/09/2020    6:25 PM 05/27/2019   10:43 AM 04/02/2017    9:35 AM  Advanced Directives  Does Patient Have a Medical Advance Directive? Yes No No No No Yes No  Type of [place]     Living will   Does patient want to make changes to medical advance directive?  No - Patient declined    No - Patient declined   Copy of [place]? No - copy requested        Would patient like information on creating a medical advance directive?   No - Patient declined Yes (Inpatient - patient requests chaplain consult to create a medical advance directive)  No - Patient declined No - Patient declined    Current Medications (verified) Outpatient Encounter Medications as of 03/20/2022  Medication Sig   acetaminophen (TYLENOL) 500 MG tablet Take 2 tablets (1,000 mg total) by mouth every 6 (six) hours as needed.   aspirin 81 MG EC tablet Take 81 mg by mouth at bedtime.   FORTEO 600 MCG/2.4ML SOPN INJECT 20MCG UNDER THE SKIN ONCE DAILY   levothyroxine (SYNTHROID) 100 MCG tablet Take 1 tablet (100 mcg total) by mouth daily.   pantoprazole  (PROTONIX) 40 MG tablet Take 1 tablet (40 mg total) by mouth daily.   PARoxetine (PAXIL) 20 MG tablet Take 1 tablet (20 mg total) by mouth every morning.   predniSONE (DELTASONE) 5 MG tablet TAKE 1 TABLET(5 MG) BY MOUTH DAILY WITH BREAKFAST   REPATHA SURECLICK 1035MG/ML SOAJ INJECT 1 PEN SUBCUTANEOUSLY EVERY 14 DAYS   traZODone (DESYREL) 50 MG tablet Take 0.5-1 tablets (25-50 mg total) by mouth at bedtime as needed for sleep.   No facility-administered encounter medications on file as of 03/20/2022.    Allergies (verified) Morphine and related, Prochlorperazine, Simvastatin, and Sulfa antibiotics   History: Past Medical History:  Diagnosis Date   Anxiety    Arthritis    CAD (coronary artery disease)    Cataract    Depression    DISORDER, MENOPAUSAL NOS 03/20/2007   Qualifier: Diagnosis of  By: JArnoldo MoraleMD, JBalinda Quails   GERD (gastroesophageal reflux disease)    Hyperlipidemia    Hypothyroidism    MYOCARDIAL INFARCTION, HX OF 09/09/2007   Qualifier: Diagnosis of  By: SLeanne ChangMD, Bruce     Osteoporosis    Polymyalgia rheumatica ([facility]    Past Surgical History:  Procedure Laterality Date   CATARACT EXTRACTION     CESAREAN SECTION     CHOLECYSTECTOMY N/A 11/24/2012   Procedure: LAPAROSCOPIC CHOLECYSTECTOMY;  Surgeon: AAnne Hahn  Rosendo Gros, MD;  Location: [place];  Service: General;  Laterality: N/A;   COSMETIC SURGERY     EYE SURGERY     eye lids lifted   PTCA     REVERSE SHOULDER ARTHROPLASTY Right 08/12/2020   Procedure: REVERSE SHOULDER ARTHROPLASTY;  Surgeon: Nicholes Stairs, MD;  Location: [place];  Service: Orthopedics;  Laterality: Right;   Family History  Problem Relation Age of Onset   Hypertension Father    Heart disease Father        Mi age 4, 39, 23 and 108.   Cancer Father    Hyperlipidemia Father    Colon cancer Maternal Uncle    Hypertension Mother    Cancer Mother    Hyperlipidemia Mother    Pulmonary fibrosis Sister    Epilepsy Son    Healthy Son    Myasthenia  gravis Son    Healthy Son    Rheum arthritis Niece    Lupus Niece    Rheum arthritis Niece    Social History   Socioeconomic History   Marital status: Married    Spouse name: Not on file   Number of children: Not on file   Years of education: Not on file   Highest education level: Not on file  Occupational History   Not on file  Tobacco Use   Smoking status: Never    Passive exposure: Never   Smokeless tobacco: Never  Vaping Use   Vaping Use: Never used  Substance and Sexual Activity   Alcohol use: No    Alcohol/week: 0.0 standard drinks of alcohol   Drug use: No   Sexual activity: Not on file  Other Topics Concern   Not on file  Social History Narrative   Husband and 2 adult children live at home. No grandkids. Son with epilepsy.      Retired at age 20    [place]- at [employer].     Social Determinants of Health   Financial Resource Strain: Low Risk  (03/20/2022)   Overall Financial Resource Strain (CARDIA)    Difficulty of Paying Living Expenses: Not hard at all  Food Insecurity: No Food Insecurity (03/20/2022)   Hunger Vital Sign    Worried About Running Out of Food in the Last Year: Never true    Ran Out of Food in the Last Year: Never true  Transportation Needs: No Transportation Needs (03/20/2022)   PRAPARE - Hydrologist (Medical): No    Lack of Transportation (Non-Medical): No  Physical Activity: Inactive (03/20/2022)   Exercise Vital Sign    Days of Exercise per Week: 0 days    Minutes of Exercise per Session: 0 min  Stress: No Stress Concern Present (03/20/2022)   [place]    Feeling of Stress : Not at all  Social Connections: Moderately Integrated (03/20/2022)   Social Connection and Isolation Panel [NHANES]    Frequency of Communication with Friends and Family: More than three times a week    Frequency of Social Gatherings with Friends and Family: More than three  times a week    Attends Religious Services: More than 4 times per year    Active Member of [employer] or Organizations: No    Attends Archivist Meetings: Never    Marital Status: Married    Tobacco Counseling Counseling given: Not Answered   Clinical Intake:  Pre-visit preparation completed: Yes  Pain : No/denies pain  BMI - recorded: 30.11 Nutritional Status: BMI > 30  Obese Nutritional Risks: None Diabetes: No  How often do you need to have someone help you when you read instructions, pamphlets, or other written materials from your doctor or pharmacy?: 1 - Never  Diabetic?no  Interpreter Needed?: No  Information entered by :: Charlott Rakes, LPN   Activities of Daily Living    03/20/2022    4:49 PM  In your present state of health, do you have any difficulty performing the following activities:  Hearing? 0  Vision? 0  Difficulty concentrating or making decisions? 0  Walking or climbing stairs? 0  Dressing or bathing? 0  Doing errands, shopping? 0  Preparing Food and eating ? N  Using the Toilet? N  In the past six months, have you accidently leaked urine? N  Do you have problems with loss of bowel control? N  Managing your Medications? N  Managing your Finances? N  Housekeeping or managing your Housekeeping? N    Patient Care Team: [name], MD as PCP - General (Family Medicine) Franchot Gallo, MD as Consulting Physician (Urology) Bo Merino, MD as Consulting Physician (Rheumatology) Armbruster, Carlota Raspberry, MD as Consulting Physician (Gastroenterology) Marybelle Killings, MD as Consulting Physician (Orthopedic Surgery) Minus Breeding, MD as Consulting Physician (Cardiology) Minus Breeding, MD as Attending Physician (Cardiology)  Indicate any recent Medical Services you may have received from other than [facility] providers in the past year (date may be approximate).     Assessment:   This is a routine wellness examination for  [place].  Hearing/Vision screen Hearing Screening - Comments:: Pt denies any hearing issues  Vision Screening - Comments:: Pt follows up with Dr Prudencio Burly for annul eye exams   Dietary issues and exercise activities discussed: Current Exercise Habits: The patient does not participate in regular exercise at present   Goals Addressed             This Visit's Progress    Patient Stated       Lose weight        Depression Screen    03/20/2022    4:47 PM 10/25/2021   11:41 AM 03/10/2021    3:27 PM 06/21/2020    2:17 PM 01/01/2019    2:44 PM 09/03/2018   11:21 AM 07/29/2017    2:52 PM  PHQ 2/9 Scores  PHQ - 2 Score 0 0 0 0 0 0 0  PHQ- 9 Score  2  1 0 1 1    Fall Risk    03/20/2022    4:46 PM 03/10/2021    3:29 PM 06/21/2020    2:17 PM 01/01/2019    2:43 PM 07/29/2017    2:14 PM  [place] in the past year? 0 1 1 0 No  Number falls in past yr: 0 1 1 0   Injury with Fall? 0 1 0 0   Comment  right shoulder     Risk for fall due to : Impaired vision Impaired vision     Follow up Falls prevention discussed Falls prevention discussed       FALL RISK PREVENTION PERTAINING TO THE HOME:  Any stairs in or around the home? No  If so, are there any without handrails? No  Home free of loose throw rugs in walkways, pet beds, electrical cords, etc? Yes  Adequate lighting in your home to reduce risk of falls? Yes   ASSISTIVE DEVICES UTILIZED TO PREVENT FALLS:  Life alert? No  Use of a cane, walker or w/c? No  Grab bars in the bathroom? No  Shower chair or bench in shower? No  Elevated toilet seat or a handicapped toilet? No   TIMED UP AND GO:  Was the test performed? No .   Cognitive Function:        03/20/2022    4:49 PM 03/10/2021    3:32 PM  6CIT Screen  What Year? 0 points 0 points  What month? 0 points 0 points  What time? 0 points 0 points  Count back from 20 0 points 0 points  Months in reverse 0 points 0 points  Repeat phrase 0 points 0 points  Total Score  0 points 0 points    Immunizations Immunization History  Administered Date(s) Administered   Fluad Quad(high Dose 65+) 11/10/2019, 11/21/2020, 11/28/2021   Influenza Whole 11/09/2008   Influenza, High Dose Seasonal PF 10/30/2016, 12/02/2017, 11/08/2018   Influenza-Unspecified 11/08/2018   PFIZER Comirnaty(Gray Top)Covid-19 Tri-Sucrose Vaccine 11/16/2021   PFIZER(Purple Top)SARS-COV-2 Vaccination 02/26/2019, 03/20/2019, 11/28/2019   PNEUMOCOCCAL CONJUGATE-20 06/21/2020   Pfizer Covid-19 Vaccine Bivalent Booster 6yr & up 11/21/2020, 07/22/2021   Pneumococcal Conjugate-13 10/11/2014   Pneumococcal Polysaccharide-23 12/15/2015   Rsv, Bivalent, Protein Subunit Rsvpref,pf (Evans Lance 11/16/2021   Td 02/12/2006   Td (Adult), 2 Lf Tetanus Toxid, Preservative Free 02/12/2006   Tdap 04/26/2017   Zoster Recombinat (Shingrix) 07/18/2021, 09/19/2021   Zoster, Live 09/19/2010    TDAP status: Up to date  Flu Vaccine status: Up to date  Pneumococcal vaccine status: Up to date  Covid-19 vaccine status: Completed vaccines  Qualifies for Shingles Vaccine? Yes   Zostavax completed Yes   Shingrix Completed?: Yes  Screening Tests Health Maintenance  Topic Date Due   COVID-19 Vaccine (7 - 2023-24 season) 01/11/2022   Medicare Annual Wellness (AWV)  03/21/2023   MAMMOGRAM  08/08/2023   COLONOSCOPY (Pts 45-471yrInsurance coverage will need to be confirmed)  11/05/2025   DTaP/Tdap/Td (3 - Td or Tdap) 04/27/2027   Pneumonia Vaccine 6513Years old  Completed   INFLUENZA VACCINE  Completed   DEXA SCAN  Completed   Hepatitis C Screening  Completed   Zoster Vaccines- Shingrix  Completed   HPV VACCINES  Aged Out    Health Maintenance  Health Maintenance Due  Topic Date Due   COVID-19 Vaccine (7 - 2023-24 season) 01/11/2022    Colorectal cancer screening: Type of screening: Colonoscopy. Completed 11/05/20. Repeat every 5 years  Mammogram status: Completed 08/07/21. Repeat every year  Bone  Density status: Completed 08/07/21. Results reflect: Bone density results: OSTEOPOROSIS. Repeat every 2 years.  Additional Screening:  Hepatitis C Screening:  Completed 08/12/18  Vision Screening: Recommended annual ophthalmology exams for early detection of glaucoma and other disorders of the eye. Is the patient up to date with their annual eye exam?  Yes  Who is the provider or what is the name of the office in which the patient attends annual eye exams? Dr LyPrudencio BurlyIf pt is not established with a provider, would they like to be referred to a provider to establish care? No .   Dental Screening: Recommended annual dental exams for proper oral hygiene  Community Resource Referral / Chronic Care Management: CRR required this visit?  No   CCM required this visit?  No      Plan:     I have personally reviewed and noted the following in the patient's chart:   Medical and social  history Use of alcohol, tobacco or illicit drugs  Current medications and supplements including opioid prescriptions. Patient is not currently taking opioid prescriptions. Functional ability and status Nutritional status Physical activity Advanced directives List of other physicians Hospitalizations, surgeries, and ER visits in previous 12 months Vitals Screenings to include cognitive, depression, and falls Referrals and appointments  In addition, I have reviewed and discussed with patient certain preventive protocols, quality metrics, and best practice recommendations. A written personalized care plan for preventive services as well as general preventive health recommendations were provided to patient.     Willette Brace, LPN   08/13/1585   Nurse Notes: none

## 2022-03-20 NOTE — Patient Instructions (Signed)
[name] , Thank you for taking time to come for your Medicare Wellness Visit. I appreciate your ongoing commitment to your health goals. Please review the following plan we discussed and let me know if I can assist you in the future.   These are the goals we discussed:  Goals      Patient Stated     Pt wants to improve right arm mobility      Patient Stated     Lose weight         This is a list of the screening recommended for you and due dates:  Health Maintenance  Topic Date Due   COVID-19 Vaccine (7 - 2023-24 season) 01/11/2022   Medicare Annual Wellness Visit  03/21/2023   Mammogram  08/08/2023   Colon Cancer Screening  11/05/2025   DTaP/Tdap/Td vaccine (3 - Td or Tdap) 04/27/2027   Pneumonia Vaccine  Completed   Flu Shot  Completed   DEXA scan (bone density measurement)  Completed   Hepatitis C Screening: USPSTF Recommendation to screen - Ages 4-79 yo.  Completed   Zoster (Shingles) Vaccine  Completed   HPV Vaccine  Aged Out    Advanced directives: Please bring a copy of your health care power of attorney and living will to the office at your convenience.  Conditions/risks identified: lose weight   Next appointment: Follow up in one year for your annual wellness visit     Preventive Care 65 Years and Older, Female Preventive care refers to lifestyle choices and visits with your health care provider that can promote health and wellness. What does preventive care include? A yearly physical exam. This is also called an annual well check. Dental exams once or twice a year. Routine eye exams. Ask your health care provider how often you should have your eyes checked. Personal lifestyle choices, including: Daily care of your teeth and gums. Regular physical activity. Eating a healthy diet. Avoiding tobacco and drug use. Limiting alcohol use. Practicing safe sex. Taking low-dose aspirin every day. Taking vitamin and mineral supplements as recommended by your  health care provider. What happens during an annual well check? The services and screenings done by your health care provider during your annual well check will depend on your age, overall health, lifestyle risk factors, and family history of disease. Counseling  Your health care provider may ask you questions about your: Alcohol use. Tobacco use. Drug use. Emotional well-being. Home and relationship well-being. Sexual activity. Eating habits. History of falls. Memory and ability to understand (cognition). Work and work Statistician. Reproductive health. Screening  You may have the following tests or measurements: Height, weight, and BMI. Blood pressure. Lipid and cholesterol levels. These may be checked every 5 years, or more frequently if you are over 42 years old. Skin check. Lung cancer screening. You may have this screening every year starting at age 36 if you have a 30-pack-year history of smoking and currently smoke or have quit within the past 15 years. Fecal occult blood test (FOBT) of the stool. You may have this test every year starting at age 29. Flexible sigmoidoscopy or colonoscopy. You may have a sigmoidoscopy every 5 years or a colonoscopy every 10 years starting at age 82. Hepatitis C blood test. Hepatitis B blood test. Sexually transmitted disease (STD) testing. Diabetes screening. This is done by checking your blood sugar (glucose) after you have not eaten for a while (fasting). You may have this done every 1-3 years. Bone density scan. This  is done to screen for osteoporosis. You may have this done starting at age 75. Mammogram. This may be done every 1-2 years. Talk to your health care provider about how often you should have regular mammograms. Talk with your health care provider about your test results, treatment options, and if necessary, the need for more tests. Vaccines  Your health care provider may recommend certain vaccines, such as: Influenza vaccine.  This is recommended every year. Tetanus, diphtheria, and acellular pertussis (Tdap, Td) vaccine. You may need a Td booster every 10 years. Zoster vaccine. You may need this after age 60. Pneumococcal 13-valent conjugate (PCV13) vaccine. One dose is recommended after age 63. Pneumococcal polysaccharide (PPSV23) vaccine. One dose is recommended after age 63. Talk to your health care provider about which screenings and vaccines you need and how often you need them. This information is not intended to replace advice given to you by your health care provider. Make sure you discuss any questions you have with your health care provider. Document Released: 02/25/2015 Document Revised: 10/19/2015 Document Reviewed: 11/30/2014 Elsevier Interactive Patient Education  2017 [place] Prevention in the Home Falls can cause injuries. They can happen to people of all ages. There are many things you can do to make your home safe and to help prevent falls. What can I do on the outside of my home? Regularly fix the edges of walkways and driveways and fix any cracks. Remove anything that might make you trip as you walk through a door, such as a raised step or threshold. Trim any bushes or trees on the path to your home. Use bright outdoor lighting. Clear any walking paths of anything that might make someone trip, such as rocks or tools. Regularly check to see if handrails are loose or broken. Make sure that both sides of any steps have handrails. Any raised decks and porches should have guardrails on the edges. Have any leaves, snow, or ice cleared regularly. Use sand or salt on walking paths during winter. Clean up any spills in your garage right away. This includes oil or grease spills. What can I do in the bathroom? Use night lights. Install grab bars by the toilet and in the tub and shower. Do not use towel bars as grab bars. Use non-skid mats or decals in the tub or shower. If you need to sit  down in the shower, use a plastic, non-slip stool. Keep the floor dry. Clean up any water that spills on the floor as soon as it happens. Remove soap buildup in the tub or shower regularly. Attach bath mats securely with double-sided non-slip rug tape. Do not have throw rugs and other things on the floor that can make you trip. What can I do in the bedroom? Use night lights. Make sure that you have a light by your bed that is easy to reach. Do not use any sheets or blankets that are too big for your bed. They should not hang down onto the floor. Have a firm chair that has side arms. You can use this for support while you get dressed. Do not have throw rugs and other things on the floor that can make you trip. What can I do in the kitchen? Clean up any spills right away. Avoid walking on wet floors. Keep items that you use a lot in easy-to-reach places. If you need to reach something above you, use a strong step stool that has a grab bar. Keep electrical cords out  of the way. Do not use floor polish or wax that makes floors slippery. If you must use wax, use non-skid floor wax. Do not have throw rugs and other things on the floor that can make you trip. What can I do with my stairs? Do not leave any items on the stairs. Make sure that there are handrails on both sides of the stairs and use them. Fix handrails that are broken or loose. Make sure that handrails are as long as the stairways. Check any carpeting to make sure that it is firmly attached to the stairs. Fix any carpet that is loose or worn. Avoid having throw rugs at the top or bottom of the stairs. If you do have throw rugs, attach them to the floor with carpet tape. Make sure that you have a light switch at the top of the stairs and the bottom of the stairs. If you do not have them, ask someone to add them for you. What else can I do to help prevent falls? Wear shoes that: Do not have high heels. Have rubber bottoms. Are  comfortable and fit you well. Are closed at the toe. Do not wear sandals. If you use a stepladder: Make sure that it is fully opened. Do not climb a closed stepladder. Make sure that both sides of the stepladder are locked into place. Ask someone to hold it for you, if possible. Clearly mark and make sure that you can see: Any grab bars or handrails. First and last steps. Where the edge of each step is. Use tools that help you move around (mobility aids) if they are needed. These include: Canes. Walkers. Scooters. Crutches. Turn on the lights when you go into a dark area. Replace any light bulbs as soon as they burn out. Set up your furniture so you have a clear path. Avoid moving your furniture around. If any of your floors are uneven, fix them. If there are any pets around you, be aware of where they are. Review your medicines with your doctor. Some medicines can make you feel dizzy. This can increase your chance of falling. Ask your doctor what other things that you can do to help prevent falls. This information is not intended to replace advice given to you by your health care provider. Make sure you discuss any questions you have with your health care provider. Document Released: 11/25/2008 Document Revised: 07/07/2015 Document Reviewed: 03/05/2014 Elsevier Interactive Patient Education  2017 [employer].

## 2022-03-26 ENCOUNTER — Encounter: Payer: Self-pay | Admitting: Rheumatology

## 2022-03-26 MED ORDER — PREDNISONE 5 MG PO TABS: ORAL_TABLET | ORAL | 0 refills | Status: DC

## 2022-03-26 NOTE — Telephone Encounter (Signed)
Next Visit: 04/12/2022  Last Visit: 01/09/2022  Last Fill:12/12/2021  Dx: Polymyalgia rheumatica   Current Dose per office note on 01/09/2022: Prednisone 5 mg p.o. daily.   Okay to refill Prednisone?

## 2022-03-29 NOTE — Progress Notes (Deleted)
Office Visit Note  Patient: [name]             Date of Birth: 12-08-1950           MRN: [id number]             PCP: [name], MD Referring: [name], MD Visit Date: 04/12/2022 Occupation: @GUAROCC$ @  Subjective:  No chief complaint on file.   History of Present Illness: [name] is a 72 y.o. female ***  She will need treatment either IV Reclast or subcutaneous Prolia to maintain her BMD.    Activities of Daily Living:  Patient reports morning stiffness for *** {minute/hour:19697}.   Patient {ACTIONS;DENIES/REPORTS:21021675::"Denies"} nocturnal pain.  Difficulty dressing/grooming: {ACTIONS;DENIES/REPORTS:21021675::"Denies"} Difficulty climbing stairs: {ACTIONS;DENIES/REPORTS:21021675::"Denies"} Difficulty getting out of chair: {ACTIONS;DENIES/REPORTS:21021675::"Denies"} Difficulty using hands for taps, buttons, cutlery, and/or writing: {ACTIONS;DENIES/REPORTS:21021675::"Denies"}  No Rheumatology ROS completed.   PMFS History:  Patient Active Problem List   Diagnosis Date Noted   Unilateral primary osteoarthritis, left hip 08/24/2021   Shoulder fracture 08/10/2020   Lumbar compression fracture ([place]) 05/11/2019   Dyslipidemia 01/26/2019   Drug-induced myopathy 01/14/2019   Haglund's deformity 01/12/2019   High risk medication use 12/04/2018   Lower esophageal ring ([place]) 03/03/2018   Osteoarthritis of right hip 12/02/2017   GERD (gastroesophageal reflux disease) 12/15/2015   Polyp of colon, adenomatous 10/11/2014   Vitamin D deficiency 05/26/2014   Osteoporosis 05/26/2014   Polymyalgia rheumatica ([place]) 12/01/2010   CAD (coronary artery disease) 09/09/2007   Hematuria 09/09/2007   Depression 03/20/2007   Hypothyroid 02/13/1999    Past Medical History:  Diagnosis Date   Anxiety    Arthritis    CAD (coronary artery disease)    Cataract    Depression    DISORDER, MENOPAUSAL NOS 03/20/2007   Qualifier: Diagnosis of  By: Arnoldo Morale MD,  Balinda Quails    GERD (gastroesophageal reflux disease)    Hyperlipidemia    Hypothyroidism    MYOCARDIAL INFARCTION, HX OF 09/09/2007   Qualifier: Diagnosis of  By: Leanne Chang MD, Bruce     Osteoporosis    Polymyalgia rheumatica ([place])     Family History  Problem Relation Age of Onset   Hypertension Father    Heart disease Father        Mi age 54, 33, 63 and 64.   Cancer Father    Hyperlipidemia Father    Colon cancer Maternal Uncle    Hypertension Mother    Cancer Mother    Hyperlipidemia Mother    Pulmonary fibrosis Sister    Epilepsy Son    Healthy Son    Myasthenia gravis Son    Healthy Son    Rheum arthritis Niece    Lupus Niece    Rheum arthritis Niece    Past Surgical History:  Procedure Laterality Date   CATARACT EXTRACTION     CESAREAN SECTION     CHOLECYSTECTOMY N/A 11/24/2012   Procedure: LAPAROSCOPIC CHOLECYSTECTOMY;  Surgeon: Ralene Ok, MD;  Location: [facility];  Service: General;  Laterality: N/A;   COSMETIC SURGERY     EYE SURGERY     eye lids lifted   PTCA     REVERSE SHOULDER ARTHROPLASTY Right 08/12/2020   Procedure: REVERSE SHOULDER ARTHROPLASTY;  Surgeon: Nicholes Stairs, MD;  Location: [facility];  Service: Orthopedics;  Laterality: Right;   Social History   Social History Narrative   Husband and 2 adult children live at home. No grandkids. Son with epilepsy.  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     Immunization History  Administered Date(s) Administered   Fluad Quad(high Dose 65+) 11/10/2019, 11/21/2020, 11/28/2021   Influenza Whole 11/09/2008   Influenza, High Dose Seasonal PF 10/30/2016, 12/02/2017, 11/08/2018   Influenza-Unspecified 11/08/2018   PFIZER Comirnaty(Gray Top)Covid-19 Tri-Sucrose Vaccine 11/16/2021   PFIZER(Purple Top)SARS-COV-2 Vaccination 02/26/2019, 03/20/2019, 11/28/2019   PNEUMOCOCCAL CONJUGATE-20 06/21/2020   Pfizer Covid-19 Vaccine Bivalent Booster 72yr & up 11/21/2020, 07/22/2021   Pneumococcal Conjugate-13  10/11/2014   Pneumococcal Polysaccharide-23 12/15/2015   Rsv, Bivalent, Protein Subunit Rsvpref,pf (Evans Lance 11/16/2021   Td 02/12/2006   Td (Adult), 2 Lf Tetanus Toxid, Preservative Free 02/12/2006   Tdap 04/26/2017   Zoster Recombinat (Shingrix) 07/18/2021, 09/19/2021   Zoster, Live 09/19/2010     Objective: Vital Signs: There were no vitals taken for this visit.   Physical Exam   Musculoskeletal Exam: ***  CDAI Exam: CDAI Score: -- Patient Global: --; Provider Global: -- Swollen: --; Tender: -- Joint Exam 04/12/2022   No joint exam has been documented for this visit   There is currently no information documented on the homunculus. Go to the Rheumatology activity and complete the homunculus joint exam.  Investigation: No additional findings.  Imaging: No results found.  Recent Labs: Lab Results  Component Value Date   WBC 7.8 11/01/2021   HGB 13.5 11/01/2021   PLT 241.0 11/01/2021   NA 140 11/01/2021   K 4.2 11/01/2021   CL 104 11/01/2021   CO2 28 11/01/2021   GLUCOSE 100 (H) 11/01/2021   BUN 16 11/01/2021   CREATININE 0.84 11/01/2021   BILITOT 0.6 11/01/2021   ALKPHOS 93 11/01/2021   AST 13 11/01/2021   ALT 13 11/01/2021   PROT 6.4 11/01/2021   ALBUMIN 3.6 11/01/2021   CALCIUM 9.2 11/01/2021   GFRAA 75 04/22/2020   QFTBGOLDPLUS NEGATIVE 08/12/2018    Speciality Comments: Forteo October 2021-February 2022 restarted June 2022 (missed 3 weeks due to fracture)  Should be completing Forteo in December 2023  Procedures:  No procedures performed Allergies: Morphine and related, Prochlorperazine, Simvastatin, and Sulfa antibiotics   Assessment / Plan:     Visit Diagnoses: Polym[place]algia rheumatica [place]  Long term (current) use of systemic steroids  High risk medication use  S/P shoulder replacement, right  Primary osteoarthritis of both hands  Primary osteoarthritis of right hip  Trochanteric bursitis, left hip  Primary osteoarthritis of both  feet  DDD (degenerative disc disease), lumbar  Age-related osteoporosis without current pathological fracture  History of compression fracture of spine  Vitamin D deficiency  History of gastroesophageal reflux (GERD)  Coronary artery disease involving native coronary artery of native heart without angina pectoris  Adenomatous polyp of colon, unspecified part of colon  History of hypothyroidism  History of depression  Orders: No orders of the defined types were placed in this encounter.  No orders of the defined types were placed in this encounter.   Face-to-face time spent with patient was *** minutes. Greater than 50% of time was spent in counseling and coordination of care.  Follow-Up Instructions: No follow-ups on file.   TOfilia Neas PA-C  Note - This record has been created using Dragon software.  Chart creation errors have been sought, but may not always  have been located. Such creation errors do not reflect on  the standard of medical care.

## 2022-04-10 NOTE — Progress Notes (Unsigned)
Office Visit Note  Patient: [name]             Date of Birth: 1950-03-15           MRN: [id number]             PCP: [name], MD Referring: [name], MD Visit Date: 04/11/2022 Occupation: '@GUAROCC'$ @  Subjective:  Discuss osteoporosis treatment options   History of Present Illness: GUSTINE FANCY is a 72 y.o. female with history of PMR and osteoporosis.  She remains on prednisone 5 mg 1 tablet by mouth daily.  She denies any signs or symptoms of a polymyalgia rheumatica flare recently.  She states that she has occasional pain and stiffness in both hands as well as ongoing discomfort and stiffness in her right shoulder and right hip.  She has not had any difficulty rising from a seated position lately.  She has not noticed any increased muscular weakness. Patient presents today to discuss DEXA results from June 2023.  Patient reports that she completed her Forteo injections in January 2024.     Activities of Daily Living:  Patient reports morning stiffness for 30 minutes.   Patient Reports nocturnal pain.  Difficulty dressing/grooming: Denies Difficulty climbing stairs: Denies Difficulty getting out of chair: Denies Difficulty using hands for taps, buttons, cutlery, and/or writing: Denies  Review of Systems  Constitutional: Negative.  Negative for fatigue.  HENT: Negative.  Negative for mouth sores and mouth dryness.   Eyes: Negative.  Negative for dryness.  Respiratory: Negative.  Negative for shortness of breath.   Cardiovascular: Negative.  Negative for chest pain and palpitations.  Gastrointestinal: Negative.  Negative for blood in stool, constipation and diarrhea.  Endocrine: Negative.  Negative for increased urination.  Genitourinary: Negative.  Negative for involuntary urination.  Musculoskeletal:  Positive for joint pain, joint pain, joint swelling, muscle weakness, morning stiffness and muscle tenderness. Negative for gait problem, myalgias and  myalgias.  Skin: Negative.  Negative for color change, rash, hair loss and sensitivity to sunlight.  Allergic/Immunologic: Negative.  Negative for susceptible to infections.  Neurological: Negative.  Negative for dizziness and headaches.  Hematological: Negative.  Negative for swollen glands.  Psychiatric/Behavioral:  Positive for sleep disturbance. Negative for depressed mood. The patient is not nervous/anxious.     PMFS History:  Patient Active Problem List   Diagnosis Date Noted   Unilateral primary osteoarthritis, left hip 08/24/2021   Shoulder fracture 08/10/2020   Lumbar compression fracture ([place]) 05/11/2019   Dyslipidemia 01/26/2019   Drug-induced myopathy 01/14/2019   Haglund's deformity 01/12/2019   High risk medication use 12/04/2018   Lower esophageal ring ([place]) 03/03/2018   Osteoarthritis of right hip 12/02/2017   GERD (gastroesophageal reflux disease) 12/15/2015   Polyp of colon, adenomatous 10/11/2014   Vitamin D deficiency 05/26/2014   Osteoporosis 05/26/2014   Polymyalgia rheumatica ([place]) 12/01/2010   CAD (coronary artery disease) 09/09/2007   Hematuria 09/09/2007   Depression 03/20/2007   Hypothyroid 02/13/1999    Past Medical History:  Diagnosis Date   Anxiety    Arthritis    CAD (coronary artery disease)    Cataract    Depression    DISORDER, MENOPAUSAL NOS 03/20/2007   Qualifier: Diagnosis of  By: Arnoldo Morale MD, Balinda Quails    GERD (gastroesophageal reflux disease)    Hyperlipidemia    Hypothyroidism    MYOCARDIAL INFARCTION, HX OF 09/09/2007   Qualifier: Diagnosis of  By: Leanne Chang MD, Darnell Level  Osteoporosis    Polymyalgia rheumatica ([facility])     Family History  Problem Relation Age of Onset   Hypertension Father    Heart disease Father        Mi age 36, 32, 41 and 49.   Cancer Father    Hyperlipidemia Father    Colon cancer Maternal Uncle    Hypertension Mother    Cancer Mother    Hyperlipidemia Mother    Pulmonary fibrosis Sister    Epilepsy Son     Healthy Son    Myasthenia gravis Son    Healthy Son    Rheum arthritis Niece    Lupus Niece    Rheum arthritis Niece    Past Surgical History:  Procedure Laterality Date   CATARACT EXTRACTION     CESAREAN SECTION     CHOLECYSTECTOMY N/A 11/24/2012   Procedure: LAPAROSCOPIC CHOLECYSTECTOMY;  Surgeon: Ralene Ok, MD;  Location: [facility];  Service: General;  Laterality: N/A;   COSMETIC SURGERY     EYE SURGERY     eye lids lifted   PTCA     REVERSE SHOULDER ARTHROPLASTY Right 08/12/2020   Procedure: REVERSE SHOULDER ARTHROPLASTY;  Surgeon: Nicholes Stairs, MD;  Location: [facility];  Service: Orthopedics;  Laterality: Right;   Social History   Social History Narrative   Husband and 2 adult children live at home. No grandkids. Son with epilepsy.      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     Immunization History  Administered Date(s) Administered   Fluad Quad(high Dose 65+) 11/10/2019, 11/21/2020, 11/28/2021   Influenza Whole 11/09/2008   Influenza, High Dose Seasonal PF 10/30/2016, 12/02/2017, 11/08/2018   Influenza-Unspecified 11/08/2018   PFIZER Comirnaty(Gray Top)Covid-19 Tri-Sucrose Vaccine 11/16/2021   PFIZER(Purple Top)SARS-COV-2 Vaccination 02/26/2019, 03/20/2019, 11/28/2019   PNEUMOCOCCAL CONJUGATE-20 06/21/2020   Pfizer Covid-19 Vaccine Bivalent Booster 41yr & up 11/21/2020, 07/22/2021   Pneumococcal Conjugate-13 10/11/2014   Pneumococcal Polysaccharide-23 12/15/2015   Rsv, Bivalent, Protein Subunit Rsvpref,pf (Evans Lance 11/16/2021   Td 02/12/2006   Td (Adult), 2 Lf Tetanus Toxid, Preservative Free 02/12/2006   Tdap 04/26/2017   Zoster Recombinat (Shingrix) 07/18/2021, 09/19/2021   Zoster, Live 09/19/2010     Objective: Vital Signs: BP (!) 143/86 (BP Location: Left Arm, Patient Position: Sitting, Cuff Size: Large)   Pulse 77   Resp 16   Ht 5' 1.5" (1.562 m)   Wt 161 lb 6.4 oz (73.2 kg)   BMI 30.00 kg/m    Physical Exam Vitals and nursing note  reviewed.  Constitutional:      Appearance: She is well-developed.  HENT:     Head: Normocephalic and atraumatic.  Eyes:     Conjunctiva/sclera: Conjunctivae normal.  Cardiovascular:     Rate and Rhythm: Normal rate and regular rhythm.     Heart sounds: Normal heart sounds.  Pulmonary:     Effort: Pulmonary effort is normal.     Breath sounds: Normal breath sounds.  Abdominal:     General: Bowel sounds are normal.     Palpations: Abdomen is soft.  Musculoskeletal:     Cervical back: Normal range of motion.  Skin:    General: Skin is warm and dry.     Capillary Refill: Capillary refill takes less than 2 seconds.  Neurological:     Mental Status: She is alert and oriented to person, place, and time.  Psychiatric:        Behavior: Behavior normal.  Musculoskeletal Exam: C-spine has limited range of motion with lateral rotation.  Thoracic kyphosis noted.  Limited abduction of the right shoulder replacement to about 90 degrees.  Left shoulder has good range of motion with no discomfort.  Elbow joints, wrist joints, MCPs, PIPs, DIPs have good range of motion with no synovitis.  PIP and DIP thickening.  Limited range of motion of both hip joints especially her right hip.  Tenderness over the right trochanteric bursa.  Knee joints have good range of motion with no warmth or effusion.  Ankle joints have good range of motion with no tenderness or joint swelling.  CDAI Exam: CDAI Score: -- Patient Global: --; Provider Global: -- Swollen: --; Tender: -- Joint Exam 04/11/2022   No joint exam has been documented for this visit   There is currently no information documented on the homunculus. Go to the Rheumatology activity and complete the homunculus joint exam.  Investigation: No additional findings.  Imaging: No results found.  Recent Labs: Lab Results  Component Value Date   WBC 7.8 11/01/2021   HGB 13.5 11/01/2021   PLT 241.0 11/01/2021   NA 140 11/01/2021   K 4.2  11/01/2021   CL 104 11/01/2021   CO2 28 11/01/2021   GLUCOSE 100 (H) 11/01/2021   BUN 16 11/01/2021   CREATININE 0.84 11/01/2021   BILITOT 0.6 11/01/2021   ALKPHOS 93 11/01/2021   AST 13 11/01/2021   ALT 13 11/01/2021   PROT 6.4 11/01/2021   ALBUMIN 3.6 11/01/2021   CALCIUM 9.2 11/01/2021   GFRAA 75 04/22/2020   QFTBGOLDPLUS NEGATIVE 08/12/2018    Speciality Comments: Forteo October 2021-February 2022 restarted June 2022 (missed 3 weeks due to fracture)  Should be completing Forteo in December 2023  Procedures:  No procedures performed Allergies: Morphine and related, Prochlorperazine, Simvastatin, and Sulfa antibiotics    Assessment / Plan:     Visit Diagnoses: Polymyalgia rheumatica ([place]) - DXd 2015 by another rheumatologist.  Patient has been on prednisone 5 mg p.o. daily for many years: She has not had any signs or symptoms of a polymyalgia rheumatica flare.  She has been unable to taper prednisone beyond 5 mg daily.  She was advised to notify us if she develops signs or symptoms of a flare.  Long term (current) use of systemic steroids - Prednisone 5 mg p.o. daily.  Unable to taper.  Effects of long-term use of steroids:HTN, diabetes, weight gain, osteoporosis, skin thinning, AVN, hyperlipidemia were discussed.   High risk medication use - Discontinued methotrexate in April 2022 as she did not notice any benefit. - Plan: CBC with Differential/Platelet, COMPLETE METABOLIC PANEL WITH GFR  S/P shoulder replacement, right: Chronic pain.  Limited abduction to about 90 degrees.   Primary osteoarthritis of both hands: She has PIP and DIP thickening consistent with osteoarthritis of both hands.  Primary osteoarthritis of right hip: Limited ROM.  Some tenderness over the right trochanteric bursa noted.   Trochanteric bursitis, left hip - injection in her left trochanteric bursa in June 2023.  Primary osteoarthritis of both feet: She is not experiencing any increased discomfort in  her feet at this time.   DDD (degenerative disc disease), lumbar: Followed by Dr. Lorin Mercy.  Age-related osteoporosis without current pathological fracture: Previous DEXA June 08, 2019 T score -2.5 left femoral neck, BMD 0.575% change 1%.   Forteo started October 2021 (gap February 22 till June 22).  Completed Forteo in January 2024.   DEXA updated on 08/07/2021: Bilateral femoral neck  BMD 0.619 with T-score -2.1.  Statistically significant increase in BMD of left hip and AP spine.  Right hip is stable.  Results were reviewed with the patient today in the office. Different treatment options were discussed today in detail including Reclast versus Prolia.  Patient felt more comfortable with proceeding with IV Reclast.  Indications, contraindications, potential side effects of Reclast were discussed today.  All questions were addressed.  We will apply for reclast through her insurance.  CBC, CMP, and vitamin D will be checked today prior to initiating Reclast.  Recommended the use of calcium and vitamin D supplementation.  History of compression fracture of spine - Previous L1 compression fracture with 60% vertebral body height loss  Vitamin D deficiency -Vitamin D level will be checked today.  She was given an informational handout about vitamin D and calcium supplementation.  Plan: VITAMIN D 25 Hydroxy (Vit-D Deficiency, Fractures)  Other medical conditions are listed as follows:  History of gastroesophageal reflux (GERD)  Coronary artery disease involving native coronary artery of native heart without angina pectoris  Adenomatous polyp of colon, unspecified part of colon  History of hypothyroidism  History of depression  Orders: Orders Placed This Encounter  Procedures   CBC with Differential/Platelet   COMPLETE METABOLIC PANEL WITH GFR   VITAMIN D 25 Hydroxy (Vit-D Deficiency, Fractures)   No orders of the defined types were placed in this encounter.   Follow-Up Instructions: Return  in about 3 months (around 07/10/2022) for Osteoporosis, Polymyalgia Rheumatica.   Ofilia Neas, PA-C  Note - This record has been created using Dragon software.  Chart creation errors have been sought, but may not always  have been located. Such creation errors do not reflect on  the standard of medical care.

## 2022-04-11 ENCOUNTER — Ambulatory Visit: Payer: PPO | Attending: Physician Assistant | Admitting: Physician Assistant

## 2022-04-11 ENCOUNTER — Encounter: Payer: Self-pay | Admitting: Physician Assistant

## 2022-04-11 VITALS — BP 143/86 | HR 77 | Resp 16 | Ht 61.5 in | Wt 161.4 lb

## 2022-04-11 DIAGNOSIS — M19042 Primary osteoarthritis, left hand: Secondary | ICD-10-CM

## 2022-04-11 DIAGNOSIS — M353 Polymyalgia rheumatica: Secondary | ICD-10-CM

## 2022-04-11 DIAGNOSIS — E559 Vitamin D deficiency, unspecified: Secondary | ICD-10-CM

## 2022-04-11 DIAGNOSIS — Z79899 Other long term (current) drug therapy: Secondary | ICD-10-CM

## 2022-04-11 DIAGNOSIS — M1611 Unilateral primary osteoarthritis, right hip: Secondary | ICD-10-CM

## 2022-04-11 DIAGNOSIS — M810 Age-related osteoporosis without current pathological fracture: Secondary | ICD-10-CM

## 2022-04-11 DIAGNOSIS — Z7952 Long term (current) use of systemic steroids: Secondary | ICD-10-CM

## 2022-04-11 DIAGNOSIS — Z8781 Personal history of (healed) traumatic fracture: Secondary | ICD-10-CM

## 2022-04-11 DIAGNOSIS — D126 Benign neoplasm of colon, unspecified: Secondary | ICD-10-CM

## 2022-04-11 DIAGNOSIS — M5136 Other intervertebral disc degeneration, lumbar region: Secondary | ICD-10-CM

## 2022-04-11 DIAGNOSIS — M19071 Primary osteoarthritis, right ankle and foot: Secondary | ICD-10-CM | POA: Diagnosis not present

## 2022-04-11 DIAGNOSIS — Z8639 Personal history of other endocrine, nutritional and metabolic disease: Secondary | ICD-10-CM

## 2022-04-11 DIAGNOSIS — M7062 Trochanteric bursitis, left hip: Secondary | ICD-10-CM | POA: Diagnosis not present

## 2022-04-11 DIAGNOSIS — Z96611 Presence of right artificial shoulder joint: Secondary | ICD-10-CM

## 2022-04-11 DIAGNOSIS — M19072 Primary osteoarthritis, left ankle and foot: Secondary | ICD-10-CM

## 2022-04-11 DIAGNOSIS — M19041 Primary osteoarthritis, right hand: Secondary | ICD-10-CM | POA: Diagnosis not present

## 2022-04-11 DIAGNOSIS — Z8719 Personal history of other diseases of the digestive system: Secondary | ICD-10-CM

## 2022-04-11 DIAGNOSIS — Z8659 Personal history of other mental and behavioral disorders: Secondary | ICD-10-CM

## 2022-04-11 DIAGNOSIS — I2510 Atherosclerotic heart disease of native coronary artery without angina pectoris: Secondary | ICD-10-CM

## 2022-04-11 DIAGNOSIS — M51369 Other intervertebral disc degeneration, lumbar region without mention of lumbar back pain or lower extremity pain: Secondary | ICD-10-CM

## 2022-04-12 ENCOUNTER — Telehealth: Payer: Self-pay | Admitting: Pharmacist

## 2022-04-12 ENCOUNTER — Ambulatory Visit: Payer: PPO | Admitting: Physician Assistant

## 2022-04-12 DIAGNOSIS — Z8639 Personal history of other endocrine, nutritional and metabolic disease: Secondary | ICD-10-CM

## 2022-04-12 DIAGNOSIS — M19041 Primary osteoarthritis, right hand: Secondary | ICD-10-CM

## 2022-04-12 DIAGNOSIS — M810 Age-related osteoporosis without current pathological fracture: Secondary | ICD-10-CM

## 2022-04-12 DIAGNOSIS — M1611 Unilateral primary osteoarthritis, right hip: Secondary | ICD-10-CM

## 2022-04-12 DIAGNOSIS — M5136 Other intervertebral disc degeneration, lumbar region: Secondary | ICD-10-CM

## 2022-04-12 DIAGNOSIS — Z96611 Presence of right artificial shoulder joint: Secondary | ICD-10-CM

## 2022-04-12 DIAGNOSIS — I2510 Atherosclerotic heart disease of native coronary artery without angina pectoris: Secondary | ICD-10-CM

## 2022-04-12 DIAGNOSIS — Z7952 Long term (current) use of systemic steroids: Secondary | ICD-10-CM

## 2022-04-12 DIAGNOSIS — Z8659 Personal history of other mental and behavioral disorders: Secondary | ICD-10-CM

## 2022-04-12 DIAGNOSIS — Z8719 Personal history of other diseases of the digestive system: Secondary | ICD-10-CM

## 2022-04-12 DIAGNOSIS — M7062 Trochanteric bursitis, left hip: Secondary | ICD-10-CM

## 2022-04-12 DIAGNOSIS — D126 Benign neoplasm of colon, unspecified: Secondary | ICD-10-CM

## 2022-04-12 DIAGNOSIS — E559 Vitamin D deficiency, unspecified: Secondary | ICD-10-CM

## 2022-04-12 DIAGNOSIS — M19072 Primary osteoarthritis, left ankle and foot: Secondary | ICD-10-CM

## 2022-04-12 DIAGNOSIS — M353 Polymyalgia rheumatica: Secondary | ICD-10-CM

## 2022-04-12 DIAGNOSIS — Z8781 Personal history of (healed) traumatic fracture: Secondary | ICD-10-CM

## 2022-04-12 DIAGNOSIS — Z79899 Other long term (current) drug therapy: Secondary | ICD-10-CM

## 2022-04-12 LAB — COMPLETE METABOLIC PANEL WITH GFR
AG Ratio: 1.7 (calc) (ref 1.0–2.5)
ALT: 13 U/L (ref 6–29)
AST: 15 U/L (ref 10–35)
Albumin: 4 g/dL (ref 3.6–5.1)
Alkaline phosphatase (APISO): 97 U/L (ref 37–153)
BUN: 10 mg/dL (ref 7–25)
CO2: 30 mmol/L (ref 20–32)
Calcium: 9.3 mg/dL (ref 8.6–10.4)
Chloride: 104 mmol/L (ref 98–110)
Creat: 0.82 mg/dL (ref 0.60–1.00)
Globulin: 2.3 g/dL (calc) (ref 1.9–3.7)
Glucose, Bld: 78 mg/dL (ref 65–99)
Potassium: 3.8 mmol/L (ref 3.5–5.3)
Sodium: 142 mmol/L (ref 135–146)
Total Bilirubin: 0.6 mg/dL (ref 0.2–1.2)
Total Protein: 6.3 g/dL (ref 6.1–8.1)
eGFR: 76 mL/min/{1.73_m2} (ref >=60)

## 2022-04-12 LAB — CBC WITH DIFFERENTIAL/PLATELET
Absolute Monocytes: 563 cells/uL (ref 200–950)
Basophils Absolute: 67 cells/uL (ref 0–200)
Basophils Relative: 0.8 %
Eosinophils Absolute: 210 cells/uL (ref 15–500)
Eosinophils Relative: 2.5 %
HCT: 40.3 % (ref 35.0–45.0)
Hemoglobin: 13.1 g/dL (ref 11.7–15.5)
Lymphs Abs: 1672 cells/uL (ref 850–3900)
MCH: 29.2 pg (ref 27.0–33.0)
MCHC: 32.5 g/dL (ref 32.0–36.0)
MCV: 90 fL (ref 80.0–100.0)
MPV: 10.8 fL (ref 7.5–12.5)
Monocytes Relative: 6.7 %
Neutro Abs: 5888 cells/uL (ref 1500–7800)
Neutrophils Relative %: 70.1 %
Platelets: 266 10*3/uL (ref 140–400)
RBC: 4.48 10*6/uL (ref 3.80–5.10)
RDW: 12.5 % (ref 11.0–15.0)
Total Lymphocyte: 19.9 %
WBC: 8.4 10*3/uL (ref 3.8–10.8)

## 2022-04-12 LAB — VITAMIN D 25 HYDROXY (VIT D DEFICIENCY, FRACTURES): Vit D, 25-Hydroxy: 38 ng/mL (ref 30–100)

## 2022-04-12 NOTE — Telephone Encounter (Addendum)
Please start RECLAST BIV through medical benefit.  Reclast - [id number]  Dose: '5mg'$  IV every 12 months  Previously tried therapies: Forteo - completed 2 years of tx in Jan 2024  Knox Saliva, PharmD, MPH, BCPS, CPP Clinical Pharmacist (Rheumatology and Pulmonology)  ----- Message from Carole Binning, LPN sent at [phone]  4:30 PM EST ----- Per Hazel Sams, PA-C, Please apply for IV Reclast. Thanks!

## 2022-04-12 NOTE — Progress Notes (Signed)
CBC and CMP WNL.  Vitamin D WNL.  Continue maintenance dose of calcium and vitamin D.  Ok to apply for IV reclast

## 2022-04-18 NOTE — Telephone Encounter (Signed)
Contacted [employer] to verify benefits for [id number].   Per rep a Pre Authorization is NOT required for this service. The member is required to pay 20% coinsurance fee until Maximum Out of Pocket amount of $3,200 is met, at which point services are covered at 100%. At this time pt has met $0 of the Max OOP. There is no Deductible. They pt may be subject to a $20 copay for Specialist visit (rep informs me that this charge is applied at the discretion of the clinic).

## 2022-04-20 ENCOUNTER — Other Ambulatory Visit: Payer: Self-pay | Admitting: Pharmacist

## 2022-04-20 DIAGNOSIS — M810 Age-related osteoporosis without current pathological fracture: Secondary | ICD-10-CM

## 2022-04-20 NOTE — Telephone Encounter (Signed)
ATC patient to disciss Reclast benefits. Unable to reach. Left VM requesting return Monday morning.  Order for Reclast infusion has been placed at [facility] in preparation  Knox Saliva, PharmD, MPH, BCPS, CPP Clinical Pharmacist (Rheumatology and Pulmonology)

## 2022-04-20 NOTE — Progress Notes (Signed)
Patient newly starting Reclast IV Diagnosis: age-related osteoporosis  Dose: '5mg'$  iV every 12 months  Last Clinic Visit: 04/11/22 Next Clinic Visit: 07/10/22  Labs: 04/11/22 - stable  Orders placed for Reclast IV x 1 doses along with premedication of acetaminophen and diphenhydramine to be administered 30 minutes before medication infusion.  ATC patient to discuss. Unable to reach but left VM requesting return call and provided with phone number for: [facility] [phone]) [place]  Will follow-up to ensured scheduled and completed  Knox Saliva, PharmD, MPH, BCPS, CPP Clinical Pharmacist (Rheumatology and Pulmonology)

## 2022-04-23 NOTE — Telephone Encounter (Signed)
Patient return call called Reclast. She states she did call her insurance and they were also unable to quote price for her. She states she does not want to move forward with infusion without exact price. I did reiterated that she woul receive generic and price of infusion is generally no more than $200. She states at this time she is unable to commit to starting Reclast infusions  Knox Saliva, PharmD, MPH, BCPS, CPP Clinical Pharmacist (Rheumatology and Pulmonology)

## 2022-04-23 NOTE — Addendum Note (Signed)
Addended by: Cassandria Anger on: 04/23/2022 11:07 AM   Modules accepted: Orders

## 2022-04-23 NOTE — Telephone Encounter (Signed)
It is very important for her to transition to an antiresorptive medication since she has completed the course of forteo. I understand the cost of reclast is a concern.  Fosamax is an alternative but can be harder to tolerate and is not as bioavailable.  Please clarify if the patient could try reclast once yearly or if she would like to try fosamax?

## 2022-04-23 NOTE — Telephone Encounter (Signed)
Called patient to discuss Reclast benefits. She states she cannot afford an infusion that is $200 but I advised that we unable to quote a price on medical claims. She states she will call her insurance and see if they can quote her a more definite price  Knox Saliva, PharmD, MPH, BCPS, CPP Clinical Pharmacist (Rheumatology and Pulmonology)

## 2022-04-23 NOTE — Progress Notes (Signed)
Reclast orders have been cancelled as patient does not want ot move forward with infusion if she does not have exact price  Knox Saliva, PharmD, MPH, BCPS, CPP Clinical Pharmacist (Rheumatology and Pulmonology)

## 2022-04-24 NOTE — Addendum Note (Signed)
Addended by: Cassandria Anger on: 04/24/2022 10:00 AM   Modules accepted: Orders

## 2022-04-24 NOTE — Telephone Encounter (Signed)
ATC patient to discuss. Per chart review, patient has received more than 5 years of alendronate treatment in past before Forteo course.  She does have history of GERD  Left VM requesting return call  Knox Saliva, PharmD, MPH, BCPS, CPP Clinical Pharmacist (Rheumatology and Pulmonology)

## 2022-04-24 NOTE — Progress Notes (Signed)
[facility]) [place] Team Statin Quality Measure Assessment  04/24/2022  [name] [birth year] [id number]  Per review of chart and payor information, patient has a diagnosis of cardiovascular disease but is not currently filling a statin prescription. This places patient into the Statin Use in Patients with Cardiovascular Disease [facility]) measures for CMS.    Patient has documented trials of atorvastatin/pitavastatin/simvastatin with reported leg pain/myalgia, but no corresponding CPT codes that would exclude patient from [facility] measure. Next PCP appointment on 04/25/2022. Repatha on file. If deemed clinically appropriate, please consider associating exclusion code at the next encounter.      Component Value Date/Time   CHOL 167 11/01/2021 0838   CHOL 266 (H) 01/01/2019 1010   TRIG 57.0 11/01/2021 0838   HDL 74.90 11/01/2021 0838   HDL 82 01/01/2019 1010   CHOLHDL 2 11/01/2021 0838   VLDL 11.4 11/01/2021 0838   LDLCALC 81 11/01/2021 0838   LDLCALC 82 01/12/2020 0834   LDLDIRECT 176.0 12/15/2015 1103    Please consider ONE of the following recommendations:  Initiate high intensity statin Atorvastatin 40 mg once daily, #90, 3 refills   Rosuvastatin 20 mg once daily, #90, 3 refills    Initiate moderate intensity  statin with reduced frequency if prior  statin intolerance 1x weekly, #13, 3 refills   2x weekly, #26, 3 refills   3x weekly, #39, 3 refills    Code for past statin intolerance  (required annually)  Provider Requirements: Must associate code during an office visit or telehealth encounter   Drug Induced Myopathy G72.0   Myositis, unspecified M60.9   Myopathy, unspecified G72.9   Rhabdomyolysis  M62.82   Myalgia (SPC ONLY) 123XX123   Alcoholic cirrhosis of liver without ascites 0000000   Alcoholic cirrhosis of liver with ascites K70.31   Unspecified cirrhosis of liver K74.60   Toxic liver disease with fibrosis and cirrhosis of liver K71.7    Thank you for allowing [facility] pharmacy to be a part of this patient's care.  Kristeen Miss, PharmD Clinical Pharmacist [place] Cell: [phone]

## 2022-04-24 NOTE — Progress Notes (Signed)
Patient newly starting Reclast IV. She has decided to to go ahead and start Reclast upon her husband's insistence. Diagnosis: age-related osteoporosis  Dose: '5mg'$  iV every 12 months  Last Clinic Visit: 04/11/22 Next Clinic Visit: 07/10/22  Labs: 04/11/22 - stable  Orders placed for Reclast IV x 1 doses along with premedication of acetaminophen and diphenhydramine to be administered 30 minutes before medication infusion.  Provided patient with phone number to schedule infusion: [facility] [phone]) Lady [place]  Will follow-up to ensured scheduled and completed  Knox Saliva, PharmD, MPH, BCPS, CPP Clinical Pharmacist (Rheumatology and Pulmonology)

## 2022-04-25 ENCOUNTER — Encounter: Payer: Self-pay | Admitting: Family Medicine

## 2022-04-25 ENCOUNTER — Ambulatory Visit (INDEPENDENT_AMBULATORY_CARE_PROVIDER_SITE_OTHER): Payer: PPO | Admitting: Family Medicine

## 2022-04-25 VITALS — BP 130/88 | HR 65 | Temp 97.7°F | Ht 61.5 in | Wt 157.6 lb

## 2022-04-25 DIAGNOSIS — E559 Vitamin D deficiency, unspecified: Secondary | ICD-10-CM | POA: Diagnosis not present

## 2022-04-25 DIAGNOSIS — E034 Atrophy of thyroid (acquired): Secondary | ICD-10-CM

## 2022-04-25 DIAGNOSIS — Z131 Encounter for screening for diabetes mellitus: Secondary | ICD-10-CM | POA: Diagnosis not present

## 2022-04-25 DIAGNOSIS — M810 Age-related osteoporosis without current pathological fracture: Secondary | ICD-10-CM

## 2022-04-25 DIAGNOSIS — M353 Polymyalgia rheumatica: Secondary | ICD-10-CM

## 2022-04-25 DIAGNOSIS — I2510 Atherosclerotic heart disease of native coronary artery without angina pectoris: Secondary | ICD-10-CM | POA: Diagnosis not present

## 2022-04-25 DIAGNOSIS — E785 Hyperlipidemia, unspecified: Secondary | ICD-10-CM

## 2022-04-25 DIAGNOSIS — Z79899 Other long term (current) drug therapy: Secondary | ICD-10-CM

## 2022-04-25 DIAGNOSIS — R739 Hyperglycemia, unspecified: Secondary | ICD-10-CM | POA: Insufficient documentation

## 2022-04-25 DIAGNOSIS — Z0000 Encounter for general adult medical examination without abnormal findings: Secondary | ICD-10-CM

## 2022-04-25 DIAGNOSIS — G720 Drug-induced myopathy: Secondary | ICD-10-CM

## 2022-04-25 DIAGNOSIS — F3342 Major depressive disorder, recurrent, in full remission: Secondary | ICD-10-CM

## 2022-04-25 LAB — COMPREHENSIVE METABOLIC PANEL
ALT: 11 U/L (ref 0–35)
AST: 14 U/L (ref 0–37)
Albumin: 3.7 g/dL (ref 3.5–5.2)
Alkaline Phosphatase: 95 U/L (ref 39–117)
BUN: 11 mg/dL (ref 6–23)
CO2: 29 mEq/L (ref 19–32)
Calcium: 9.2 mg/dL (ref 8.4–10.5)
Chloride: 105 mEq/L (ref 96–112)
Creatinine, Ser: 0.98 mg/dL (ref 0.40–1.20)
GFR: 57.6 mL/min — ABNORMAL LOW (ref >60.00)
Glucose, Bld: 99 mg/dL (ref 70–99)
Potassium: 4.3 mEq/L (ref 3.5–5.1)
Sodium: 141 mEq/L (ref 135–145)
Total Bilirubin: 0.7 mg/dL (ref 0.2–1.2)
Total Protein: 6 g/dL (ref 6.0–8.3)

## 2022-04-25 LAB — CBC WITH DIFFERENTIAL/PLATELET
Basophils Absolute: 0.1 10*3/uL (ref 0.0–0.1)
Basophils Relative: 0.8 % (ref 0.0–3.0)
Eosinophils Absolute: 0.2 10*3/uL (ref 0.0–0.7)
Eosinophils Relative: 1.7 % (ref 0.0–5.0)
HCT: 42.2 % (ref 36.0–46.0)
Hemoglobin: 13.7 g/dL (ref 12.0–15.0)
Lymphocytes Relative: 8.6 % — ABNORMAL LOW (ref 12.0–46.0)
Lymphs Abs: 0.8 10*3/uL (ref 0.7–4.0)
MCHC: 32.3 g/dL (ref 30.0–36.0)
MCV: 91.7 fl (ref 78.0–100.0)
Monocytes Absolute: 0.5 10*3/uL (ref 0.1–1.0)
Monocytes Relative: 4.9 % (ref 3.0–12.0)
Neutro Abs: 8 10*3/uL — ABNORMAL HIGH (ref 1.4–7.7)
Neutrophils Relative %: 84 % — ABNORMAL HIGH (ref 43.0–77.0)
Platelets: 242 10*3/uL (ref 150.0–400.0)
RBC: 4.6 Mil/uL (ref 3.87–5.11)
RDW: 14.6 % (ref 11.5–15.5)
WBC: 9.5 10*3/uL (ref 4.0–10.5)

## 2022-04-25 LAB — LIPID PANEL
Cholesterol: 224 mg/dL — ABNORMAL HIGH (ref 0–200)
HDL: 70.5 mg/dL (ref >39.00)
LDL Cholesterol: 140 mg/dL — ABNORMAL HIGH (ref 0–99)
NonHDL: 153.38
Total CHOL/HDL Ratio: 3
Triglycerides: 68 mg/dL (ref 0.0–149.0)
VLDL: 13.6 mg/dL (ref 0.0–40.0)

## 2022-04-25 LAB — HEMOGLOBIN A1C: Hgb A1c MFr Bld: 6 % (ref 4.6–6.5)

## 2022-04-25 LAB — VITAMIN B12: Vitamin B-12: 157 pg/mL — ABNORMAL LOW (ref 211–911)

## 2022-04-25 LAB — TSH: TSH: 0.74 u[IU]/mL (ref 0.35–5.50)

## 2022-04-25 NOTE — Progress Notes (Addendum)
Phone [phone]   Subjective:  Patient presents today for their annual physical. Chief complaint-noted.   See problem oriented charting- ROS- full  review of systems was completed and negative except for: recent cold- day 3 with cough, congestion- negative for covid at home. Back pain, joint swelling, anxiety  The following were reviewed and entered/updated in epic: Past Medical History:  Diagnosis Date   Anxiety    Arthritis    CAD (coronary artery disease)    Cataract    Depression    DISORDER, MENOPAUSAL NOS 03/20/2007   Qualifier: Diagnosis of  By: Arnoldo Morale MD, Balinda Quails    GERD (gastroesophageal reflux disease)    Hyperlipidemia    Hypothyroidism    MYOCARDIAL INFARCTION, HX OF 09/09/2007   Qualifier: Diagnosis of  By: Leanne Chang MD, Bruce     Osteoporosis    Polymyalgia rheumatica [facility])    Patient Active Problem List   Diagnosis Date Noted   Polymyalgia rheumatica ([place]) 12/01/2010    Priority: High   CAD (coronary artery disease) 09/09/2007    Priority: High   Hyperglycemia 04/25/2022    Priority: Medium    Dyslipidemia 01/26/2019    Priority: Medium    Drug-induced myopathy 01/14/2019    Priority: Medium    High risk medication use 12/04/2018    Priority: Medium    Lower esophageal ring ([place]) 03/03/2018    Priority: Medium    Osteoarthritis of right hip 12/02/2017    Priority: Medium    GERD (gastroesophageal reflux disease) 12/15/2015    Priority: Medium    Vitamin D deficiency 05/26/2014    Priority: Medium    Osteoporosis 05/26/2014    Priority: Medium    Depression 03/20/2007    Priority: Medium    Hypothyroid 02/13/1999    Priority: Medium    Polyp of colon, adenomatous 10/11/2014    Priority: Low   Hematuria 09/09/2007    Priority: Low   Unilateral primary osteoarthritis, left hip 08/24/2021    Priority: 1.   Shoulder fracture 08/10/2020    Priority: 1.   Lumbar compression fracture ([place]) 05/11/2019    Priority: 1.   Haglund's deformity  01/12/2019    Priority: 1.   Past Surgical History:  Procedure Laterality Date   CATARACT EXTRACTION     CESAREAN SECTION     CHOLECYSTECTOMY N/A 11/24/2012   Procedure: LAPAROSCOPIC CHOLECYSTECTOMY;  Surgeon: Ralene Ok, MD;  Location: [place];  Service: General;  Laterality: N/A;   COSMETIC SURGERY     EYE SURGERY     eye lids lifted   PTCA     REVERSE SHOULDER ARTHROPLASTY Right 08/12/2020   Procedure: REVERSE SHOULDER ARTHROPLASTY;  Surgeon: Nicholes Stairs, MD;  Location: [place];  Service: Orthopedics;  Laterality: Right;    Family History  Problem Relation Age of Onset   Hypertension Father    Heart disease Father        Mi age 29, 51, 63 and 23.   Cancer Father    Hyperlipidemia Father    Colon cancer Maternal Uncle    Hypertension Mother    Cancer Mother    Hyperlipidemia Mother    Pulmonary fibrosis Sister    Epilepsy Son    Healthy Son    Myasthenia gravis Son    Healthy Son    Rheum arthritis Niece    Lupus Niece    Rheum arthritis Niece     Medications- reviewed and updated Current Outpatient Medications  Medication Sig Dispense Refill  acetaminophen (TYLENOL) 500 MG tablet Take 2 tablets (1,000 mg total) by mouth every 6 (six) hours as needed. 30 tablet 0   aspirin 81 MG EC tablet Take 81 mg by mouth at bedtime.     levothyroxine (SYNTHROID) 100 MCG tablet Take 1 tablet (100 mcg total) by mouth daily. 90 tablet 0   pantoprazole (PROTONIX) 40 MG tablet Take 1 tablet (40 mg total) by mouth daily. 90 tablet 0   PARoxetine (PAXIL) 20 MG tablet Take 1 tablet (20 mg total) by mouth every morning. 90 tablet 0   predniSONE (DELTASONE) 5 MG tablet TAKE 1 TABLET(5 MG) BY MOUTH DAILY WITH BREAKFAST 90 tablet 0   REPATHA SURECLICK [id number] MG/ML SOAJ INJECT 1 PEN SUBCUTANEOUSLY EVERY 14 DAYS 2 mL 11   traZODone (DESYREL) 50 MG tablet Take 0.5-1 tablets (25-50 mg total) by mouth at bedtime as needed for sleep. 30 tablet 3   No current facility-administered  medications for this visit.    Allergies-reviewed and updated Allergies  Allergen Reactions   Morphine And Related Nausea And Vomiting   Prochlorperazine     REACTION: nerve reaction   Simvastatin     REACTION: leg cramps   Sulfa Antibiotics     Social History   Social History Narrative   Husband and 2 adult children live at home. No grandkids. Son with epilepsy.      Retired at age 29    [place]- at [employer].     Objective  Objective:  BP (!) 140/80   Pulse 65   Temp 97.7 F (36.5 C)   Ht 5' 1.5" (1.562 m)   Wt 157 lb 9.6 oz (71.5 kg)   SpO2 98%   BMI 29.30 kg/m  Gen: NAD, resting comfortably HEENT: Mucous membranes are moist. Oropharynx normal other than mild erythema in turbinates with clear drainage, tympanic membrane with cerumen but she declines need for intervention Neck: no thyromegaly CV: RRR no murmurs rubs or gallops Lungs: CTAB no crackles, wheeze, rhonchi Abdomen: soft/nontender/nondistended/normal bowel sounds. No rebound or guarding.  Ext: no edema Skin: warm, dry Neuro: grossly normal, moves all extremities, PERRLA   Assessment and Plan   72 y.o. female presenting for annual physical.  Health Maintenance counseling: 1. Anticipatory guidance: Patient counseled regarding regular dental exams -q6 months, eye exams - yearly,  avoiding smoking and second hand smoke , limiting alcohol to 1 beverage per day- doesn't drink, no illicit drugs.   2. Risk factor reduction:  Advised patient of need for regular exercise and diet rich and fruits and vegetables to reduce risk of heart attack and stroke.  Exercise- tough due to joint pains.  Diet/weight management-thankfully prior weight gain has reversed-now down 3 pounds from last visit and from last physical in 2022. She has improved diet - cut down on nighttime snacking and improved choices Wt Readings from Last 3 Encounters:  04/25/22 157 lb 9.6 oz (71.5 kg)  04/11/22 161 lb 6.4 oz (73.2 kg)  03/20/22 162 lb  (73.5 kg)  3. Immunizations/screenings/ancillary studies-recommended repeat COVID shot next fall  Immunization History  Administered Date(s) Administered   Fluad Quad(high Dose 65+) 11/10/2019, 11/21/2020, 11/28/2021   Influenza Whole 11/09/2008   Influenza, High Dose Seasonal PF 10/30/2016, 12/02/2017, 11/08/2018   Influenza-Unspecified 11/08/2018   PFIZER Comirnaty(Gray Top)Covid-19 Tri-Sucrose Vaccine 11/16/2021   PFIZER(Purple Top)SARS-COV-2 Vaccination 02/26/2019, 03/20/2019, 11/28/2019   PNEUMOCOCCAL CONJUGATE-20 06/21/2020   Pfizer Covid-19 Vaccine Bivalent Booster 20yr & up 11/21/2020, 07/22/2021   Pneumococcal Conjugate-13 10/11/2014  Pneumococcal Polysaccharide-23 12/15/2015   Rsv, Bivalent, Protein Subunit Rsvpref,pf Evans Lance) 11/16/2021   Td 02/12/2006   Td (Adult), 2 Lf Tetanus Toxid, Preservative Free 02/12/2006   Tdap 04/26/2017   Zoster Recombinat (Shingrix) 07/18/2021, 09/19/2021   Zoster, Live 09/19/2010   4. Cervical cancer screening- PAP past age base screening recommendations   5. Breast cancer screening-  Completes self breast exam monthly and mammogram 08/07/2021- Recommened 1 year repeat  6. Colon cancer screening- very hesitant with how she did with shoulder surgery to be sedated again. Even though she has had precancerous polyps only wants to do colonoscopy IF cologuard positive- cologuard 10/31/20 with 3 year repeat 7. Skin cancer screening- Advised regular sunscreen use. Denies worrisome, changing, or new skin lesions.  Does not have dermatologist bmet interested  8. Birth control/STD check- postmenopausal/monogamous 9. Osteoporosis screening at 65-see discussion below-followed by rheumatology -Never smoker  Status of chronic or acute concerns   #elevated BP- blood pressure high acceptable range- she wants to remain off of meds- plus had cold medicine yesterday which could drive up. In September with me blood pressure was 122/80 so think we can monitor for  now BP Readings from Last 3 Encounters:  04/25/22 (!) 140/80  04/11/22 (!) 143/86  01/09/22 (!) 155/88   # CAD-follows with Dr. Percival Spanish #hyperlipidemia with statin induced myopathy (G72.0) S: Medication:Aspirin 81 mg, Repatha 140 mg every 2 weeks -Most recent stress testing 04/13/2021 with Dr. Percival Spanish -no chest pain or shortness of breath even with illness Lab Results  Component Value Date   CHOL 167 11/01/2021   HDL 74.90 11/01/2021   LDLCALC 81 11/01/2021   LDLDIRECT 176.0 12/15/2015   TRIG 57.0 11/01/2021   CHOLHDL 2 11/01/2021  A/P: has visit in 2 weeks with cardiology. Asymptomatic continue current medications  For lipids- update today- prefer for LDL to be under 70 but has been slightly above- unfortunately ran out of repatha in December- offered to refill- she wants to update lipids and discuss with him in 2 weeks  # Polymyalgia rheumatica-follows with Dr. Estanislado Pandy S: Medication: Chronic prednisone 5 mg-has been incredibly challenging to wean A/P: stable- continue current medicines     #hypothyroidism S: compliant On thyroid medication-levothyroxine 100 mcg -prior fatigue from last visit beter Lab Results  Component Value Date   TSH 0.86 02/07/2022  A/P: hopefully stable- update tsh today. Continue current meds for now   # Depression S: Medication:Paxil 20 mg daily, trazodone as needed for sleep -still a lot of stress caring for son with severe epilepsy and falls A/P: full remission- continue current medications    # GERD S:Medication: pantoprazole '40mg'$ - no reflux on this  B12 levels related to PPI use: Low normal in the past- not taking b12 right now  A/P: stable- continue current medicines  =check vit b12 with long term PPI use- suspect will need supplement   # Osteoporosis-followed by rheumatology  #Vitamin D deficiency S: Medication: Vitamin D 1000 units, Reclast yearly planned  A/P: suspect will be stable once on reclast- she will call to schedule. Also  wonder if reflux will improve off prior forteo.   # Hyperglycemia/insulin resistance/prediabetes- a1c of 6.1 in 2014 S:  Medication: none  A/P: hopefully stable or improved- update a1c today. Continue without meds for now  Recommended follow up: Return in about 6 months (around 10/26/2022) for followup or sooner if needed.Schedule b4 you leave. Future Appointments  Date Time Provider [place]  05/04/2022  2:40 PM Minus Breeding, MD [facility]  None  07/10/2022  9:40 AM Ofilia Neas, PA-C [facility] None  04/02/2023  2:45 PM [facility] HEALTH COACH [facility]   Lab/Order associations: fasting   ICD-10-CM   1. Preventative health care  Z00.00     2. Coronary artery disease involving native coronary artery of native heart without angina pectoris  I25.10     3. Polymyalgia rheumatica (HCC)  M35.3     4. Recurrent major depressive disorder, in full remission ([place])  F33.42     5. Drug-induced myopathy  G72.0     6. Dyslipidemia  E78.5     7. Hypothyroidism due to acquired atrophy of thyroid  E03.4     8. Age-related osteoporosis without current pathological fracture  M81.0     9. Vitamin D deficiency  E55.9     10. High risk medication use  Z79.899     11. Hyperglycemia  R73.9     12. Screening for diabetes mellitus  Z13.1       No orders of the defined types were placed in this encounter.   Return precautions advised.  Garret Reddish, MD

## 2022-04-25 NOTE — Patient Instructions (Addendum)
Please stop by lab before you go If you have mychart- we will send your results within 3 business days of [place] receiving them.  If you do not have mychart- we will call you about results within 5 business days of [place] receiving them.  *please also note that you will see labs on mychart as soon as they post. I will later go in and write notes on them- will say "notes from Dr. Yong Channel"   Let us know if symptoms do not improve within 10 days or worsen- would want to see you back  Blood pressure high acceptable- could be cold/medicine- if does not improve over time could look at meds in future  Recommended follow up: Return in about 6 months (around 10/26/2022) for followup or sooner if needed.Schedule b4 you leave.

## 2022-04-26 ENCOUNTER — Encounter: Payer: Self-pay | Admitting: Family Medicine

## 2022-04-26 DIAGNOSIS — E538 Deficiency of other specified B group vitamins: Secondary | ICD-10-CM | POA: Insufficient documentation

## 2022-04-26 NOTE — Telephone Encounter (Signed)
Patient and I talked yesterday = she decided she does want to move forward with Reclast. Hse was provided with information ot schedule Reclast infusion at [facility]  Knox Saliva, PharmD, MPH, BCPS, CPP Clinical Pharmacist (Rheumatology and Pulmonology)

## 2022-04-26 NOTE — Progress Notes (Signed)
Reclast scheduled for 05/07/22. Will f/u to ensure completed  Knox Saliva, PharmD, MPH, BCPS, CPP Clinical Pharmacist (Rheumatology and Pulmonology)

## 2022-04-29 ENCOUNTER — Encounter: Payer: Self-pay | Admitting: Family Medicine

## 2022-04-29 ENCOUNTER — Telehealth: Payer: PPO | Admitting: Physician Assistant

## 2022-04-29 DIAGNOSIS — B9689 Other specified bacterial agents as the cause of diseases classified elsewhere: Secondary | ICD-10-CM | POA: Diagnosis not present

## 2022-04-29 DIAGNOSIS — J069 Acute upper respiratory infection, unspecified: Secondary | ICD-10-CM

## 2022-04-29 MED ORDER — AMOXICILLIN-POT CLAVULANATE 875-125 MG PO TABS: 1.0000 | ORAL_TABLET | Freq: Two times a day (BID) | ORAL | 0 refills | Status: DC

## 2022-04-29 MED ORDER — PREDNISONE 20 MG PO TABS: 40.0000 mg | ORAL_TABLET | Freq: Every day | ORAL | 0 refills | Status: DC

## 2022-04-29 NOTE — Progress Notes (Signed)
Because more information is needed about your symptoms, I feel your condition warrants further evaluation and I recommend that you be seen for a face to face visit. This could also include a virtual urgent care visit to discuss your symptoms.  Please contact your primary care physician practice to be seen.  Many offices offer virtual options to be seen via video if you are not comfortable going in person to a medical facility at this time.  NOTE: You will NOT be charged for this eVisit.  If you are having a true medical emergency please call 911.  Your e-visit answers were reviewed by a board certified advanced clinical practitioner to complete your personal care plan.  Thank you for using e-Visits.   [name] PA-C

## 2022-04-29 NOTE — Progress Notes (Signed)
Virtual Visit Consent   [name], you are scheduled for a virtual visit with a [place] provider today. Just as with appointments in the office, your consent must be obtained to participate. Your consent will be active for this visit and any virtual visit you may have with one of our providers in the next 365 days. If you have a MyChart account, a copy of this consent can be sent to you electronically.  As this is a virtual visit, video technology does not allow for your provider to perform a traditional examination. This may limit your provider's ability to fully assess your condition. If your provider identifies any concerns that need to be evaluated in person or the need to arrange testing (such as labs, EKG, etc.), we will make arrangements to do so. Although advances in technology are sophisticated, we cannot ensure that it will always work on either your end or our end. If the connection with a video visit is poor, the visit may have to be switched to a telephone visit. With either a video or telephone visit, we are not always able to ensure that we have a secure connection.  By engaging in this virtual visit, you consent to the provision of healthcare and authorize for your insurance to be billed (if applicable) for the services provided during this visit. Depending on your insurance coverage, you may receive a charge related to this service.  I need to obtain your verbal consent now. Are you willing to proceed with your visit today? [name] has provided verbal consent on 04/29/2022 for a virtual visit (video or telephone). Mar Daring, PA-C  Date: 04/29/2022 10:27 AM  Virtual Visit via Video Note   I, Mar Daring, connected with  [name]  ([id number], [birth year]) on 04/29/22 at 10:45 AM EDT by a video-enabled telemedicine application and verified that I am speaking with the correct person using two identifiers.  Location: Patient: Virtual Visit Location  Patient: Home Provider: Virtual Visit Location Provider: Home Office   I discussed the limitations of evaluation and management by telemedicine and the availability of in person appointments. The patient expressed understanding and agreed to proceed.    History of Present Illness: [name] is a 72 y.o. who identifies as a female who was assigned female at birth, and is being seen today for sore throat, cough, ear pain.  HPI: URI  This is a new problem. The current episode started in the past 7 days. The problem has been gradually worsening. The maximum temperature recorded prior to her arrival was 101 - 101.9 F (100.8-101). The fever has been present for 1 to 2 days. Associated symptoms include congestion, coughing, ear pain, headaches, rhinorrhea, sinus pain and a sore throat. Pertinent negatives include no diarrhea, nausea, plugged ear sensation, vomiting or wheezing. Associated symptoms comments: dizziness. Treatments tried: Mucinex, nasacort, tessalon perles. The treatment provided no relief.      Problems:  Patient Active Problem List   Diagnosis Date Noted   B12 deficiency 04/26/2022   Hyperglycemia 04/25/2022   Unilateral primary osteoarthritis, left hip 08/24/2021   Shoulder fracture 08/10/2020   Lumbar compression fracture ([place]) 05/11/2019   Dyslipidemia 01/26/2019   Drug-induced myopathy 01/14/2019   Haglund's deformity 01/12/2019   High risk medication use 12/04/2018   Lower esophageal ring ([place]) 03/03/2018   Osteoarthritis of right hip 12/02/2017   GERD (gastroesophageal reflux disease) 12/15/2015   Polyp of colon, adenomatous 10/11/2014   Vitamin D  deficiency 05/26/2014   Osteoporosis 05/26/2014   Polymyalgia rheumatica ([place]) 12/01/2010   CAD (coronary artery disease) 09/09/2007   Hematuria 09/09/2007   Depression 03/20/2007   Hypothyroid 02/13/1999    Allergies:  Allergies  Allergen Reactions   Morphine And Related Nausea And Vomiting    Prochlorperazine     REACTION: nerve reaction   Simvastatin     REACTION: leg cramps   Sulfa Antibiotics    Medications:  Current Outpatient Medications:    amoxicillin-clavulanate (AUGMENTIN) 875-125 MG tablet, Take 1 tablet by mouth 2 (two) times daily., Disp: 20 tablet, Rfl: 0   predniSONE (DELTASONE) 20 MG tablet, Take 2 tablets (40 mg total) by mouth daily with breakfast., Disp: 10 tablet, Rfl: 0   acetaminophen (TYLENOL) 500 MG tablet, Take 2 tablets (1,000 mg total) by mouth every 6 (six) hours as needed., Disp: 30 tablet, Rfl: 0   aspirin 81 MG EC tablet, Take 81 mg by mouth at bedtime., Disp: , Rfl:    levothyroxine (SYNTHROID) 100 MCG tablet, Take 1 tablet (100 mcg total) by mouth daily., Disp: 90 tablet, Rfl: 0   pantoprazole (PROTONIX) 40 MG tablet, Take 1 tablet (40 mg total) by mouth daily., Disp: 90 tablet, Rfl: 0   PARoxetine (PAXIL) 20 MG tablet, Take 1 tablet (20 mg total) by mouth every morning., Disp: 90 tablet, Rfl: 0   predniSONE (DELTASONE) 5 MG tablet, TAKE 1 TABLET(5 MG) BY MOUTH DAILY WITH BREAKFAST, Disp: 90 tablet, Rfl: 0   REPATHA SURECLICK [id number] MG/ML SOAJ, INJECT 1 PEN SUBCUTANEOUSLY EVERY 14 DAYS, Disp: 2 mL, Rfl: 11   traZODone (DESYREL) 50 MG tablet, Take 0.5-1 tablets (25-50 mg total) by mouth at bedtime as needed for sleep., Disp: 30 tablet, Rfl: 3  Observations/Objective: Patient is well-developed, well-nourished in no acute distress.  Resting comfortably at home.  Head is normocephalic, atraumatic.  No labored breathing.  Speech is clear and coherent with logical content.  Patient is alert and oriented at baseline.  Deep, harsh, bronchial cough heard  Assessment and Plan: 1. Bacterial upper respiratory infection - amoxicillin-clavulanate (AUGMENTIN) 875-125 MG tablet; Take 1 tablet by mouth 2 (two) times daily.  Dispense: 20 tablet; Refill: 0 - predniSONE (DELTASONE) 20 MG tablet; Take 2 tablets (40 mg total) by mouth daily with breakfast.  Dispense:  10 tablet; Refill: 0  - Worsening over a week despite OTC medications - Will treat with Augmentin and Prednisone - Can continue Mucinex  - Push fluids.  - Rest.  - Steam and humidifier can help - Seek in person evaluation if worsening or symptoms fail to improve    Follow Up Instructions: I discussed the assessment and treatment plan with the patient. The patient was provided an opportunity to ask questions and all were answered. The patient agreed with the plan and demonstrated an understanding of the instructions.  A copy of instructions were sent to the patient via MyChart unless otherwise noted below.    The patient was advised to call back or seek an in-person evaluation if the symptoms worsen or if the condition fails to improve as anticipated.  Time:  I spent 10 minutes with the patient via telehealth technology discussing the above problems/concerns.    Mar Daring, PA-C

## 2022-04-29 NOTE — Patient Instructions (Signed)
[name], thank you for joining Mar Daring, PA-C for today's virtual visit.  While this provider is not your primary care provider (PCP), if your PCP is located in our provider database this encounter information will be shared with them immediately following your visit.   [facility] account gives you access to today's visit and all your visits, tests, and labs performed at [facility] " click here if you don't have a [facility] account or go to [email or web address]  Consent: (Patient) [name] provided verbal consent for this virtual visit at the beginning of the encounter.  Current Medications:  Current Outpatient Medications:    amoxicillin-clavulanate (AUGMENTIN) 875-125 MG tablet, Take 1 tablet by mouth 2 (two) times daily., Disp: 20 tablet, Rfl: 0   predniSONE (DELTASONE) 20 MG tablet, Take 2 tablets (40 mg total) by mouth daily with breakfast., Disp: 10 tablet, Rfl: 0   acetaminophen (TYLENOL) 500 MG tablet, Take 2 tablets (1,000 mg total) by mouth every 6 (six) hours as needed., Disp: 30 tablet, Rfl: 0   aspirin 81 MG EC tablet, Take 81 mg by mouth at bedtime., Disp: , Rfl:    levothyroxine (SYNTHROID) 100 MCG tablet, Take 1 tablet (100 mcg total) by mouth daily., Disp: 90 tablet, Rfl: 0   pantoprazole (PROTONIX) 40 MG tablet, Take 1 tablet (40 mg total) by mouth daily., Disp: 90 tablet, Rfl: 0   PARoxetine (PAXIL) 20 MG tablet, Take 1 tablet (20 mg total) by mouth every morning., Disp: 90 tablet, Rfl: 0   predniSONE (DELTASONE) 5 MG tablet, TAKE 1 TABLET(5 MG) BY MOUTH DAILY WITH BREAKFAST, Disp: 90 tablet, Rfl: 0   REPATHA SURECLICK [id number] MG/ML SOAJ, INJECT 1 PEN SUBCUTANEOUSLY EVERY 14 DAYS, Disp: 2 mL, Rfl: 11   traZODone (DESYREL) 50 MG tablet, Take 0.5-1 tablets (25-50 mg total) by mouth at bedtime as needed for sleep., Disp: 30 tablet, Rfl: 3   Medications ordered in this encounter:  Meds ordered this encounter   Medications   amoxicillin-clavulanate (AUGMENTIN) 875-125 MG tablet    Sig: Take 1 tablet by mouth 2 (two) times daily.    Dispense:  20 tablet    Refill:  0    Order Specific Question:   Supervising Provider    Answer:   Chase Picket [id number]   predniSONE (DELTASONE) 20 MG tablet    Sig: Take 2 tablets (40 mg total) by mouth daily with breakfast.    Dispense:  10 tablet    Refill:  0    Order Specific Question:   Supervising Provider    Answer:   Chase Picket [id number]     *If you need refills on other medications prior to your next appointment, please contact your pharmacy*  Follow-Up: Call back or seek an in-person evaluation if the symptoms worsen or if the condition fails to improve as anticipated.  [facility] [phone]  Other Instructions  Acute Bronchitis, Adult  Acute bronchitis is sudden inflammation of the main airways (bronchi) that come off the windpipe (trachea) in the lungs. The swelling causes the airways to get smaller and make more mucus than normal. This can make it hard to breathe and can cause coughing or noisy breathing (wheezing). Acute bronchitis may last several weeks. The cough may last longer. Allergies, asthma, and exposure to smoke may make the condition worse. What are the causes? This condition can be caused by germs and by substances that irritate the lungs,  including: Cold and flu viruses. The most common cause of this condition is the virus that causes the common cold. Bacteria. This is less common. Breathing in substances that irritate the lungs, including: Smoke from cigarettes and other forms of tobacco. Dust and pollen. Fumes from household cleaning products, gases, or burned fuel. Indoor or outdoor air pollution. What increases the risk? The following factors may make you more likely to develop this condition: A weak body's defense system, also called the immune system. A condition that affects your lungs  and breathing, such as asthma. What are the signs or symptoms? Common symptoms of this condition include: Coughing. This may bring up clear, yellow, or green mucus from your lungs (sputum). Wheezing. Runny or stuffy nose. Having too much mucus in your lungs (chest congestion). Shortness of breath. Aches and pains, including sore throat or chest. How is this diagnosed? This condition is usually diagnosed based on: Your symptoms and medical history. A physical exam. You may also have other tests, including tests to rule out other conditions, such as pneumonia. These tests include: A test of lung function. Test of a mucus sample to look for the presence of bacteria. Tests to check the oxygen level in your blood. Blood tests. Chest X-ray. How is this treated? Most cases of acute bronchitis clear up over time without treatment. Your health care provider may recommend: Drinking more fluids to help thin your mucus so it is easier to cough up. Taking inhaled medicine (inhaler) to improve air flow in and out of your lungs. Using a vaporizer or a humidifier. These are machines that add water to the air to help you breathe better. Taking a medicine that thins mucus and clears congestion (expectorant). Taking a medicine that prevents or stops coughing (cough suppressant). It is not common to take an antibiotic medicine for this condition. Follow these instructions at home:  Take over-the-counter and prescription medicines only as told by your health care provider. Use an inhaler, vaporizer, or humidifier as told by your health care provider. Take two teaspoons (10 mL) of honey at bedtime to lessen coughing at night. Drink enough fluid to keep your urine pale yellow. Do not use any products that contain nicotine or tobacco. These products include cigarettes, chewing tobacco, and vaping devices, such as e-cigarettes. If you need help quitting, ask your health care provider. Get plenty of  rest. Return to your normal activities as told by your health care provider. Ask your health care provider what activities are safe for you. Keep all follow-up visits. This is important. How is this prevented? To lower your risk of getting this condition again: Wash your hands often with soap and water for at least 20 seconds. If soap and water are not available, use hand sanitizer. Avoid contact with people who have cold symptoms. Try not to touch your mouth, nose, or eyes with your hands. Avoid breathing in smoke or chemical fumes. Breathing smoke or chemical fumes will make your condition worse. Get the flu shot every year. Contact a health care provider if: Your symptoms do not improve after 2 weeks. You have trouble coughing up the mucus. Your cough keeps you awake at night. You have a fever. Get help right away if you: Cough up blood. Feel pain in your chest. Have severe shortness of breath. Faint or keep feeling like you are going to faint. Have a severe headache. Have a fever or chills that get worse. These symptoms may represent a serious problem that is  an emergency. Do not wait to see if the symptoms will go away. Get medical help right away. Call your local emergency services (911 in the [place] Do not drive yourself to the hospital. Summary Acute bronchitis is inflammation of the main airways (bronchi) that come off the windpipe (trachea) in the lungs. The swelling causes the airways to get smaller and make more mucus than normal. Drinking more fluids can help thin your mucus so it is easier to cough up. Take over-the-counter and prescription medicines only as told by your health care provider. Do not use any products that contain nicotine or tobacco. These products include cigarettes, chewing tobacco, and vaping devices, such as e-cigarettes. If you need help quitting, ask your health care provider. Contact a health care provider if your symptoms do not improve after 2  weeks. This information is not intended to replace advice given to you by your health care provider. Make sure you discuss any questions you have with your health care provider. Document Revised: 05/11/2021 Document Reviewed: 06/01/2020 Elsevier Patient Education  [place].    If you have been instructed to have an in-person evaluation today at a local Urgent Care facility, please use the link below. It will take you to a list of all of our available [facility], including address, phone number and hours of operation. Please do not delay care.  [facility]  If you or a family member do not have a primary care provider, use the link below to schedule a visit and establish care. When you choose a [facility] primary care physician or advanced practice provider, you gain a long-term partner in health. Find a Primary Care Provider  Learn more about [facility]'s in-office and virtual care options: [facility]

## 2022-04-30 ENCOUNTER — Other Ambulatory Visit: Payer: Self-pay

## 2022-04-30 DIAGNOSIS — E538 Deficiency of other specified B group vitamins: Secondary | ICD-10-CM

## 2022-04-30 MED ORDER — CYANOCOBALAMIN 1000 MCG/ML IJ SOLN: INTRAMUSCULAR | 15 refills | Status: DC

## 2022-04-30 NOTE — Progress Notes (Unsigned)
B 12 sent to pharmacy 

## 2022-05-03 NOTE — Progress Notes (Signed)
Cardiology Office Note   Date:  05/04/2022   ID:  [name], DOB [birth year], MRN [id number]  PCP:  Meghan Olp, MD  Cardiologist:   Minus Breeding, MD  Referring:  Meghan Olp, MD   Chief Complaint  Patient presents with   Coronary Artery Disease     History of Present Illness: [name] is a 72 y.o. female who presents for follow-up of coronary disease. He's been several years since she was seen. She had a marginal branch myocardial infarction in 2009. There was a long 99% stenosis which was reduced to 50% stenosis with dissection and TIMI-3 flow. She was managed medically this and has done well.   Since I last saw her I sent her for a POET (Plain Old Exercise Treadmill).  This was submax but she did not have any high risk findings.     Since then she has not had any further chest pain.  She did run out of her Repatha so her most recent cholesterol was not at target.  She denies any chest pressure, neck or arm discomfort.  She has had no new shortness of breath, PND or orthopnea.  She had no new palpitations, presyncope or syncope.     Past Medical History:  Diagnosis Date   Anxiety    Arthritis    CAD (coronary artery disease)    Cataract    Depression    DISORDER, MENOPAUSAL NOS 03/20/2007   Qualifier: Diagnosis of  By: Meghan Morale MD, [name]    GERD (gastroesophageal reflux disease)    Hyperlipidemia    Hypothyroidism    MYOCARDIAL INFARCTION, HX OF 09/09/2007   Qualifier: Diagnosis of  By: Meghan Chang MD, [name]     Osteoporosis    Polymyalgia rheumatica [facility])     Past Surgical History:  Procedure Laterality Date   CATARACT EXTRACTION     CESAREAN SECTION     CHOLECYSTECTOMY N/A 11/24/2012   Procedure: LAPAROSCOPIC CHOLECYSTECTOMY;  Surgeon: Meghan Ok, MD;  Location: [facility];  Service: General;  Laterality: N/A;   COSMETIC SURGERY     EYE SURGERY     eye lids lifted   PTCA     REVERSE SHOULDER ARTHROPLASTY Right 08/12/2020   Procedure: REVERSE  SHOULDER ARTHROPLASTY;  Surgeon: Meghan Stairs, MD;  Location: [place];  Service: Orthopedics;  Laterality: Right;     Current Outpatient Medications  Medication Sig Dispense Refill   amoxicillin-clavulanate (AUGMENTIN) 875-125 MG tablet Take 1 tablet by mouth 2 (two) times daily. 20 tablet 0   aspirin 81 MG EC tablet Take 81 mg by mouth at bedtime.     cyanocobalamin (VITAMIN B12) 1000 MCG/ML injection 1000 mcg (1 mg) injection once per week for four weeks, followed by 1000 mcg injection once per month. 1 mL 15   levothyroxine (SYNTHROID) 100 MCG tablet Take 1 tablet (100 mcg total) by mouth daily. 90 tablet 0   pantoprazole (PROTONIX) 40 MG tablet Take 1 tablet (40 mg total) by mouth daily. 90 tablet 0   PARoxetine (PAXIL) 20 MG tablet Take 1 tablet (20 mg total) by mouth every morning. 90 tablet 0   predniSONE (DELTASONE) 20 MG tablet Take 2 tablets (40 mg total) by mouth daily with breakfast. 10 tablet 0   predniSONE (DELTASONE) 5 MG tablet TAKE 1 TABLET(5 MG) BY MOUTH DAILY WITH BREAKFAST 90 tablet 0   traZODone (DESYREL) 50 MG tablet Take 0.5-1 tablets (25-50 mg total) by mouth at bedtime as  needed for sleep. 30 tablet 3   No current facility-administered medications for this visit.    Allergies:   Morphine and related, Prochlorperazine, Simvastatin, and Sulfa antibiotics    ROS:  Please see the history of present illness.   Otherwise, review of systems are positive for none.   All other systems are reviewed and negative.    PHYSICAL EXAM: VS:  BP (!) 146/86   Pulse 67   Ht 5' 1.5" (1.562 m)   Wt 157 lb (71.2 kg)   SpO2 96%   BMI 29.18 kg/m  , BMI Body mass index is 29.18 kg/m. GENERAL:  Well appearing NECK:  No jugular venous distention, waveform within normal limits, carotid upstroke brisk and symmetric, no bruits, no thyromegaly LUNGS:  Clear to auscultation bilaterally CHEST:  Unremarkable HEART:  PMI not displaced or sustained,S1 and S2 within normal limits, no  S3, no S4, no clicks, no rubs, no murmurs ABD:  Flat, positive bowel sounds normal in frequency in pitch, no bruits, no rebound, no guarding, no midline pulsatile mass, no hepatomegaly, no splenomegaly EXT:  2 plus pulses throughout, no edema, no cyanosis no clubbing   EKG:  EKG is  ordered today. The ekg ordered today demonstrates sinus rhythm, rate 67, axis within normal limits, onspecific T-wave flattening.    Recent Labs: 04/25/2022: ALT 11; BUN 11; Creatinine, Ser 0.98; Hemoglobin 13.7; Platelets 242.0; Potassium 4.3; Sodium 141; TSH 0.74    Lipid Panel    Component Value Date/Time   CHOL 224 (H) 04/25/2022 0928   CHOL 266 (H) 01/01/2019 1010   TRIG 68.0 04/25/2022 0928   HDL 70.50 04/25/2022 0928   HDL 82 01/01/2019 1010   CHOLHDL 3 04/25/2022 0928   VLDL 13.6 04/25/2022 0928   LDLCALC 140 (H) 04/25/2022 0928   LDLCALC 82 01/12/2020 0834   LDLDIRECT 176.0 12/15/2015 1103      Wt Readings from Last 3 Encounters:  05/04/22 157 lb (71.2 kg)  04/25/22 157 lb 9.6 oz (71.5 kg)  04/11/22 161 lb 6.4 oz (73.2 kg)      Other studies Reviewed: Additional studies/ records that were reviewed today include: Hospital records Review of the above records demonstrates:  See elsewhere   ASSESSMENT AND PLAN:   CAD:   The patient has no new sypmtoms.  No further cardiovascular testing is indicated.  We will continue with aggressive risk reduction and meds as listed.  DYSLIPIDEMIA:  She is on Repatha.  However, she has been out of this and her LDL went from 81 back up to 140.  She will get a renewal on the Repatha needs to have a lipid profile in 3 months.    HTN: Her blood pressure is mildly elevated today but she says this is unusual manage this is well-controlled at home.  Keep an eye on this.  No change in therapy.  Current medicines are reviewed at length with the patient today.  The patient does not have concerns regarding medicines.  The following changes have been made:   None  Labs/ tests ordered today include:    No orders of the defined types were placed in this encounter.    Disposition:   FU with me in 12 months.    Signed, Minus Breeding, MD  05/04/2022 3:14 PM    [facility]

## 2022-05-04 ENCOUNTER — Telehealth: Payer: Self-pay

## 2022-05-04 ENCOUNTER — Encounter: Payer: Self-pay | Admitting: Cardiology

## 2022-05-04 ENCOUNTER — Ambulatory Visit: Payer: PPO | Attending: Cardiology | Admitting: Cardiology

## 2022-05-04 ENCOUNTER — Encounter: Payer: Self-pay | Admitting: Pharmacist

## 2022-05-04 VITALS — BP 146/86 | HR 67 | Ht 61.5 in | Wt 157.0 lb

## 2022-05-04 DIAGNOSIS — I2510 Atherosclerotic heart disease of native coronary artery without angina pectoris: Secondary | ICD-10-CM | POA: Diagnosis not present

## 2022-05-04 DIAGNOSIS — E785 Hyperlipidemia, unspecified: Secondary | ICD-10-CM | POA: Diagnosis not present

## 2022-05-04 MED ORDER — REPATHA SURECLICK 140 MG/ML ~~LOC~~ SOAJ: 1.0000 | SUBCUTANEOUS | 3 refills | Status: DC

## 2022-05-04 NOTE — Telephone Encounter (Signed)
Pt states that she ran out of her repatha at office visit with Dr. Percival Spanish. Per chart review pt was receiving funds from [employer]. Will send to pharmDs to look into this.

## 2022-05-04 NOTE — Patient Instructions (Addendum)
Medication Instructions:  Your physician recommends that you continue on your current medications as directed. Please refer to the Current Medication list given to you today.  *If you need a refill on your cardiac medications before your next appointment, please call your pharmacy*   Lab Work: Your physician recommends that you return for lab work in: 3 months after starting [employer] lipid panel  If you have labs (blood work) drawn today and your tests are completely normal, you will receive your results only by: [place] (if you have MyChart) OR A paper copy in the mail If you have any lab test that is abnormal or we need to change your treatment, we will call you to review the results.   Follow-Up: At [facility], you and your health needs are our priority.  As part of our continuing mission to provide you with exceptional heart care, we have created designated Provider Care Teams.  These Care Teams include your primary Cardiologist (physician) and Advanced Practice Providers (APPs -  Physician Assistants and Nurse Practitioners) who all work together to provide you with the care you need, when you need it.  We recommend signing up for the patient portal called "MyChart".  Sign up information is provided on this After Visit Summary.  MyChart is used to connect with patients for Virtual Visits (Telemedicine).  Patients are able to view lab/test results, encounter notes, upcoming appointments, etc.  Non-urgent messages can be sent to your provider as well.   To learn more about what you can do with MyChart, go to [email or web address].    Your next appointment:   12 month(s)  Provider:   Minus Breeding, MD

## 2022-05-04 NOTE — Telephone Encounter (Signed)
Spoke with pt regarding [employer]well [name]. Relayed information per [employer]. Pt verbalizes understanding and she is very thankful.

## 2022-05-04 NOTE — Addendum Note (Signed)
Addended by: Tayana Shankle E on: 05/04/2022 03:50 PM   Modules accepted: Orders

## 2022-05-04 NOTE — Telephone Encounter (Addendum)
Pt's H[employer] grant renewed, info sent to her in a [employer]. Looks like med was removed from her med list today, I have sent in a refill.

## 2022-05-06 ENCOUNTER — Other Ambulatory Visit: Payer: Self-pay | Admitting: Family Medicine

## 2022-05-06 DIAGNOSIS — E034 Atrophy of thyroid (acquired): Secondary | ICD-10-CM

## 2022-05-07 ENCOUNTER — Ambulatory Visit (HOSPITAL_COMMUNITY)
Admission: RE | Admit: 2022-05-07 | Discharge: 2022-05-07 | Disposition: A | Discharge status: Home / Self Care | Payer: PPO | Source: Ambulatory Visit | Attending: Rheumatology | Admitting: Rheumatology

## 2022-05-07 DIAGNOSIS — M810 Age-related osteoporosis without current pathological fracture: Secondary | ICD-10-CM

## 2022-05-07 MED ORDER — ZOLEDRONIC ACID 5 MG/100ML IV SOLN
INTRAVENOUS | Status: AC
  Administered 2022-05-07: 5 mg via INTRAVENOUS
  Filled 2022-05-07: qty 100

## 2022-05-07 MED ORDER — LEVOTHYROXINE SODIUM 100 MCG PO TABS: 100.0000 ug | ORAL_TABLET | Freq: Every day | ORAL | 0 refills | Status: DC

## 2022-05-07 MED ORDER — ACETAMINOPHEN 325 MG PO TABS
ORAL_TABLET | ORAL | Status: AC
  Administered 2022-05-07: 650 mg via ORAL
  Filled 2022-05-07: qty 2

## 2022-05-07 MED ORDER — DIPHENHYDRAMINE HCL 25 MG PO CAPS: 25.0000 mg | ORAL_CAPSULE | Freq: Once | ORAL | Status: AC

## 2022-05-07 MED ORDER — PANTOPRAZOLE SODIUM 40 MG PO TBEC: 40.0000 mg | DELAYED_RELEASE_TABLET | Freq: Every day | ORAL | 0 refills | Status: DC

## 2022-05-07 MED ORDER — ZOLEDRONIC ACID 5 MG/100ML IV SOLN: 5.0000 mg | Freq: Once | INTRAVENOUS | Status: AC

## 2022-05-07 MED ORDER — PAROXETINE HCL 20 MG PO TABS: 20.0000 mg | ORAL_TABLET | Freq: Every morning | ORAL | 0 refills | Status: DC

## 2022-05-07 MED ORDER — DIPHENHYDRAMINE HCL 25 MG PO CAPS
ORAL_CAPSULE | ORAL | Status: AC
  Administered 2022-05-07: 25 mg via ORAL
  Filled 2022-05-07: qty 1

## 2022-05-07 MED ORDER — ACETAMINOPHEN 325 MG PO TABS: 650.0000 mg | ORAL_TABLET | Freq: Once | ORAL | Status: AC

## 2022-05-10 ENCOUNTER — Encounter: Payer: Self-pay | Admitting: Family Medicine

## 2022-05-13 ENCOUNTER — Telehealth: Payer: Self-pay | Admitting: Family Medicine

## 2022-05-13 NOTE — Telephone Encounter (Signed)
On-call note.  I was called by staff with the answering service.  Patient called in reporting gastroenteritis symptoms with vomiting and diarrhea that have been going on since Saturday, 05/12/2022.  The initial disposition was emergency room, based on frequent vomiting/diarrhea and the risk for dehydration.  Patient declined ER disposition and requested Phenergan suppository.  Staff with answering service reported that the patient's husband got on the phone call and was profane with answering service.  The patient reportedly was not profane.   I was called about the situation by answering service staff.  We got the patient on the call and I talked with her about her symptoms.  She has had multiple/recurrent episodes of vomiting and diarrhea to the point where she is now dry heaving.  She was initially not speaking in complete sentences, though she did so later in the phone call.  She did admit that she was feeling diffusely weak.  Given her situation with the concern for dehydration I advised her to go to the emergency room tonight as that would be the most appropriate medical option.  She would likely need IV fluids, or at least likely need to be evaluated for the need for IV fluids.  She declined emergency room evaluation because of concern for wait time.  I offered to call the emergency room in advance of her arrival to see if that could expedite her care and she declined that.  I did not send in Phenergan suppositories because the medical disposition in this case would be emergency room evaluation.  She ended the phone call.  She was not profane with me and her husband did not participate with me on the phone call.  She was clearly advised to go to the emergency room.  She was offered multiple times for me to call on her behalf, and she did not opt for that.  I will defer to PCP, routed as FYI.

## 2022-05-14 ENCOUNTER — Encounter: Payer: Self-pay | Admitting: Family

## 2022-05-14 ENCOUNTER — Ambulatory Visit (INDEPENDENT_AMBULATORY_CARE_PROVIDER_SITE_OTHER): Payer: PPO | Admitting: Family

## 2022-05-14 VITALS — BP 121/82 | HR 91 | Temp 98.0°F | Ht 61.5 in | Wt 151.0 lb

## 2022-05-14 DIAGNOSIS — K529 Noninfective gastroenteritis and colitis, unspecified: Secondary | ICD-10-CM

## 2022-05-14 MED ORDER — PROMETHAZINE HCL 50 MG RE SUPP: 50.0000 mg | Freq: Four times a day (QID) | RECTAL | 0 refills | Status: DC | PRN

## 2022-05-14 MED ORDER — PROMETHAZINE HCL 25 MG/ML IJ SOLN
50.0000 mg | Freq: Once | INTRAMUSCULAR | Status: AC
  Administered 2022-05-15: 50 mg via INTRAMUSCULAR

## 2022-05-14 NOTE — Telephone Encounter (Signed)
Please schedule ov with available provider.

## 2022-05-14 NOTE — Telephone Encounter (Signed)
You did not forward to anyone that I can see

## 2022-05-14 NOTE — Progress Notes (Signed)
Patient ID: [name], female    DOB: 01-28-1951, 72 y.o.   MRN: [id number]  Chief Complaint  Patient presents with   Diarrhea    Pt co diarrhea and vomiting for 3 days, Has tried phenergan suppositories which did help for about a hour and imodium which did not help. Has not urinated since yesterday.     HPI:      N,V, diarrhea:  Pt co diarrhea and vomiting for 3 days, Has tried phenergan suppositories which did help for about a hour and imodium which did not help. Has not urinated since yesterday. Denies fever, reports she has had a few "squirts" of diarrhea today, seems to be easing off, and 5 vomiting episodes so far today, but several were dry heaves. She is unable to keep any fluids down, has tried ice chips, gingerale, she can't keep sips down. States she is dizzy when sitting up or standing.  Reports abdominal cramping d/t diarrhea, denies pressure or pain in her bladder. Reports having episodes like this in the past.       Assessment & Plan:  1. Gastroenteritis - VSS; pt given phenergan injection in office. Advised to go to ER d/t inability to urinate, pt continues to refuse d/t long wait times and feeling so sick. Advised pt and husband of serious risk to her health of dehydration, ARF, uremia, shock. Advised trying every 75minutes to chew or suck  on ice, can try sour popsicles, juicy fruits etc to get fluids in,  tiny sips of any fluid that she can tolerate (avoid caffeine).  If she is still unable to keep fluids down this evening she needs to call EMS and have them start an IV so she has fluids going in even if she has to wait in ER to be seen.  - promethazine (PHENERGAN) injection 50 mg - promethazine (PHENERGAN) 50 MG suppository; Place 1 suppository (50 mg total) rectally every 6 (six) hours as needed for nausea or vomiting.  Dispense: 12 each; Refill: 0   Subjective:    Outpatient Medications Prior to Visit  Medication Sig Dispense Refill   amoxicillin-clavulanate  (AUGMENTIN) 875-125 MG tablet Take 1 tablet by mouth 2 (two) times daily. 20 tablet 0   aspirin 81 MG EC tablet Take 81 mg by mouth at bedtime.     cyanocobalamin (VITAMIN B12) 1000 MCG/ML injection 1000 mcg (1 mg) injection once per week for four weeks, followed by 1000 mcg injection once per month. 1 mL 15   Evolocumab (REPATHA SURECLICK) XX123456 MG/ML SOAJ Inject 140 mg into the skin every 14 (fourteen) days. 6 mL 3   levothyroxine (SYNTHROID) 100 MCG tablet Take 1 tablet by mouth once daily 90 tablet 0   levothyroxine (SYNTHROID) 100 MCG tablet Take 1 tablet (100 mcg total) by mouth daily. 90 tablet 0   pantoprazole (PROTONIX) 40 MG tablet Take 1 tablet by mouth once daily 90 tablet 0   pantoprazole (PROTONIX) 40 MG tablet Take 1 tablet (40 mg total) by mouth daily. 90 tablet 0   PARoxetine (PAXIL) 20 MG tablet TAKE 1 TABLET BY MOUTH ONCE DAILY IN THE MORNING 90 tablet 0   PARoxetine (PAXIL) 20 MG tablet Take 1 tablet (20 mg total) by mouth every morning. 90 tablet 0   predniSONE (DELTASONE) 20 MG tablet Take 2 tablets (40 mg total) by mouth daily with breakfast. 10 tablet 0   predniSONE (DELTASONE) 5 MG tablet TAKE 1 TABLET(5 MG) BY MOUTH DAILY WITH BREAKFAST 90  tablet 0   traZODone (DESYREL) 50 MG tablet Take 0.5-1 tablets (25-50 mg total) by mouth at bedtime as needed for sleep. 30 tablet 3   No facility-administered medications prior to visit.   Past Medical History:  Diagnosis Date   Anxiety    Arthritis    CAD (coronary artery disease)    Cataract    Depression    DISORDER, MENOPAUSAL NOS 03/20/2007   Qualifier: Diagnosis of  By: Arnoldo Morale MD, Balinda Quails    GERD (gastroesophageal reflux disease)    Hyperlipidemia    Hypothyroidism    MYOCARDIAL INFARCTION, HX OF 09/09/2007   Qualifier: Diagnosis of  By: Leanne Chang MD, Bruce     Osteoporosis    Polymyalgia rheumatica    Past Surgical History:  Procedure Laterality Date   CATARACT EXTRACTION     CESAREAN SECTION     CHOLECYSTECTOMY N/A  11/24/2012   Procedure: LAPAROSCOPIC CHOLECYSTECTOMY;  Surgeon: Ralene Ok, MD;  Location: [facility];  Service: General;  Laterality: N/A;   COSMETIC SURGERY     EYE SURGERY     eye lids lifted   PTCA     REVERSE SHOULDER ARTHROPLASTY Right 08/12/2020   Procedure: REVERSE SHOULDER ARTHROPLASTY;  Surgeon: Nicholes Stairs, MD;  Location: [facility];  Service: Orthopedics;  Laterality: Right;   Allergies  Allergen Reactions   Morphine And Related Nausea And Vomiting   Prochlorperazine     REACTION: nerve reaction   Simvastatin     REACTION: leg cramps   Sulfa Antibiotics       Objective:    Physical Exam Vitals and nursing note reviewed.  Constitutional:      General: She is in acute distress (lying on her side on exam table).     Appearance: Normal appearance. She is ill-appearing. She is not toxic-appearing.  Cardiovascular:     Rate and Rhythm: Normal rate and regular rhythm.  Pulmonary:     Effort: Pulmonary effort is normal.     Breath sounds: Normal breath sounds.  Musculoskeletal:        General: Normal range of motion.  Skin:    General: Skin is cool and dry.     Coloration: Skin is pale.  Neurological:     Mental Status: She is alert.  Psychiatric:        Mood and Affect: Mood normal.        Behavior: Behavior normal.    BP 121/82 (BP Location: Left Arm, Patient Position: Sitting, Cuff Size: Large)   Pulse 91   Temp 98 F (36.7 C) (Temporal)   Ht 5' 1.5" (1.562 m)   Wt 151 lb (68.5 kg)   SpO2 96%   BMI 28.07 kg/m  Wt Readings from Last 3 Encounters:  05/14/22 151 lb (68.5 kg)  05/04/22 157 lb (71.2 kg)  04/25/22 157 lb 9.6 oz (71.5 kg)      Jeanie Sewer, NP

## 2022-05-14 NOTE — Telephone Encounter (Signed)
Advised to go to ED, patient refused.   Patient Name: [name] Gender: Female DOB: 08/01/1950 Age: 72 Y 13 M 20 D Return Phone Number: [phone] (Primary) Address: City/ State/ Zip: [place]  [address] Client [name] Client Site [name] [place] Night Provider [name] Contact Type Call Who Is Calling Patient / Member / Family / Caregiver Call Type Triage / Clinical Relationship To Patient Self Return Phone Number [phone] (Primary) Chief Complaint Vomiting Reason for Call Symptomatic / Request for [place] states they are vomiting and diarrhea. Translation No Nurse Assessment Nurse: [name], RN, [name] Date/Time (Eastern Time): 05/13/2022 9:34:57 PM Confirm and document reason for call. If symptomatic, describe symptoms. ---Caller states she has had vomiting and diarrhea since yesterday. used phenergan suppository at 1pm. has relief for up to 4 hours from it. says her vomit and diarrhea is on and off all day today, vomit x9-10. Does the patient have any new or worsening symptoms? ---Yes Will a triage be completed? ---Yes Related visit to physician within the last 2 weeks? ---No Does the PT have any chronic conditions? (i.e. diabetes, asthma, this includes High risk factors for pregnancy, etc.) ---No Is this a behavioral health or substance abuse call? ---No Guidelines Guideline Title Affirmed Question Affirmed Notes Nurse Date/Time (Eastern Time) Vomiting [1] SEVERE vomiting (e.g., 6 or more times/day) AND [2] present > 8 hours (Exception: Patient sounds well, is drinking liquids, does not sound dehydrated, and [name], RN, [employer] 05/13/2022 9:39:36 PM Guidelines Guideline Title Affirmed Question Affirmed Notes Nurse Date/Time (Eastern Time) vomiting has lasted less than 24 hours.) Difficult Call [1] Caller demands to speak with the  PCP AND [2] about sick adult (or sick caller) [name], [place], [place] 05/13/2022 9:47:09 PM Disp. Time [name] Time) Disposition Final User 05/13/2022 9:43:44 PM Go to ED Now (or PCP triage) [name], RN, [name] 05/13/2022 9:47:52 PM Call PCP Now Yes [name], RN, [name] 05/13/2022 9:54:29 PM Called On-Call Provider [name], RN, [name] Final Disposition 05/13/2022 9:47:52 PM Call PCP Now Yes [name], RN, [name] Caller Disagree/Comply Comply Caller Understands Yes PreDisposition Call Doctor Care Advice Given Per Guideline GO TO ED NOW (OR PCP TRIAGE): * IF NO PCP (PRIMARY CARE PROVIDER) SECOND-LEVEL TRIAGE: You need to be seen within the next hour. [facility] to [facility] at [facility] [address] as soon as you can. BRING A BUCKET IN CASE OF VOMITING: * You may wish to bring a bucket, pan, or plastic bag with you in case there is more vomiting during the drive. BRING MEDICINES: * Please bring a list of your current medicines when you go to see the doctor. CARE ADVICE per Vomiting (Adult) guideline. CALL PCP NOW: * You need to discuss this with your doctor (or NP/PA). CALL BACK IF: * You become worse or you have other questions. CARE ADVICE given per Difficult Call (Adult) guideline. Comments User: [name], RN Date/Time (Eastern Time): 05/13/2022 10:01:04 PM after giving triage disposition husband came on the phone and was aggressive, educated that new meds are not called in after hours, husband stated she was not going to ED to sit there for 12 hours, and that Dr. Retail banker needs to write her the suppository and he would have my job and come to the office tomorrow if she did not receive this med. demanding to speak with OCP. Referrals GO TO FACILITY REFUSED Paging DoctorName Phone DateTime Result/ Outcome Message Type Notes  [name] [id number] 05/13/2022 9:54:29 PM Called On Call Provider - Reached Doctor Paged [name] "[name] 05/13/2022 10:03:01 PM Spoke with  On Call - General Message Result advised OCP of situation, called pt on conference with OCP, pt did not agree and hung up.

## 2022-05-15 ENCOUNTER — Other Ambulatory Visit: Payer: Self-pay

## 2022-05-15 ENCOUNTER — Emergency Department (HOSPITAL_COMMUNITY)
Admission: EM | Admit: 2022-05-15 | Discharge: 2022-05-15 | Disposition: A | Discharge status: Home / Self Care | Payer: PPO | Attending: Emergency Medicine | Admitting: Emergency Medicine

## 2022-05-15 ENCOUNTER — Ambulatory Visit: Payer: PPO | Admitting: Physician Assistant

## 2022-05-15 ENCOUNTER — Encounter (HOSPITAL_COMMUNITY): Payer: Self-pay | Admitting: Emergency Medicine

## 2022-05-15 ENCOUNTER — Emergency Department (HOSPITAL_COMMUNITY): Payer: PPO

## 2022-05-15 ENCOUNTER — Encounter: Payer: Self-pay | Admitting: Family

## 2022-05-15 DIAGNOSIS — K529 Noninfective gastroenteritis and colitis, unspecified: Secondary | ICD-10-CM

## 2022-05-15 DIAGNOSIS — R112 Nausea with vomiting, unspecified: Secondary | ICD-10-CM

## 2022-05-15 DIAGNOSIS — Z0389 Encounter for observation for other suspected diseases and conditions ruled out: Secondary | ICD-10-CM | POA: Diagnosis not present

## 2022-05-15 DIAGNOSIS — Z7982 Long term (current) use of aspirin: Secondary | ICD-10-CM | POA: Diagnosis not present

## 2022-05-15 DIAGNOSIS — D72829 Elevated white blood cell count, unspecified: Secondary | ICD-10-CM | POA: Diagnosis not present

## 2022-05-15 DIAGNOSIS — R1111 Vomiting without nausea: Secondary | ICD-10-CM | POA: Diagnosis not present

## 2022-05-15 DIAGNOSIS — R531 Weakness: Secondary | ICD-10-CM | POA: Diagnosis not present

## 2022-05-15 DIAGNOSIS — R197 Diarrhea, unspecified: Secondary | ICD-10-CM | POA: Insufficient documentation

## 2022-05-15 DIAGNOSIS — I2510 Atherosclerotic heart disease of native coronary artery without angina pectoris: Secondary | ICD-10-CM | POA: Insufficient documentation

## 2022-05-15 DIAGNOSIS — N3000 Acute cystitis without hematuria: Secondary | ICD-10-CM

## 2022-05-15 DIAGNOSIS — R0789 Other chest pain: Secondary | ICD-10-CM | POA: Diagnosis not present

## 2022-05-15 DIAGNOSIS — K449 Diaphragmatic hernia without obstruction or gangrene: Secondary | ICD-10-CM | POA: Diagnosis not present

## 2022-05-15 DIAGNOSIS — R42 Dizziness and giddiness: Secondary | ICD-10-CM | POA: Diagnosis not present

## 2022-05-15 DIAGNOSIS — R109 Unspecified abdominal pain: Secondary | ICD-10-CM | POA: Diagnosis not present

## 2022-05-15 DIAGNOSIS — R1084 Generalized abdominal pain: Secondary | ICD-10-CM

## 2022-05-15 DIAGNOSIS — R110 Nausea: Secondary | ICD-10-CM | POA: Diagnosis not present

## 2022-05-15 DIAGNOSIS — E860 Dehydration: Secondary | ICD-10-CM | POA: Insufficient documentation

## 2022-05-15 DIAGNOSIS — R072 Precordial pain: Secondary | ICD-10-CM | POA: Insufficient documentation

## 2022-05-15 DIAGNOSIS — R918 Other nonspecific abnormal finding of lung field: Secondary | ICD-10-CM | POA: Diagnosis not present

## 2022-05-15 DIAGNOSIS — R079 Chest pain, unspecified: Secondary | ICD-10-CM | POA: Diagnosis not present

## 2022-05-15 LAB — BASIC METABOLIC PANEL
Anion gap: 11 (ref 5–15)
BUN: 19 mg/dL (ref 8–23)
CO2: 19 mmol/L — ABNORMAL LOW (ref 22–32)
Calcium: 7.5 mg/dL — ABNORMAL LOW (ref 8.9–10.3)
Chloride: 107 mmol/L (ref 98–111)
Creatinine, Ser: 1.04 mg/dL — ABNORMAL HIGH (ref 0.44–1.00)
GFR, Estimated: 57 mL/min — ABNORMAL LOW (ref >60)
Glucose, Bld: 122 mg/dL — ABNORMAL HIGH (ref 70–99)
Potassium: 3.5 mmol/L (ref 3.5–5.1)
Sodium: 137 mmol/L (ref 135–145)

## 2022-05-15 LAB — URINALYSIS, ROUTINE W REFLEX MICROSCOPIC
Bilirubin Urine: NEGATIVE
Glucose, UA: NEGATIVE mg/dL
Ketones, ur: 5 mg/dL — AB
Nitrite: POSITIVE — AB
Protein, ur: 30 mg/dL — AB
Specific Gravity, Urine: 1.04 — ABNORMAL HIGH (ref 1.005–1.030)
WBC, UA: >50 WBC/hpf (ref 0–5)
pH: 5 (ref 5.0–8.0)

## 2022-05-15 LAB — CBC
HCT: 40.3 % (ref 36.0–46.0)
Hemoglobin: 13.1 g/dL (ref 12.0–15.0)
MCH: 29.7 pg (ref 26.0–34.0)
MCHC: 32.5 g/dL (ref 30.0–36.0)
MCV: 91.4 fL (ref 80.0–100.0)
Platelets: 245 10*3/uL (ref 150–400)
RBC: 4.41 MIL/uL (ref 3.87–5.11)
RDW: 13.5 % (ref 11.5–15.5)
WBC: 10.8 10*3/uL — ABNORMAL HIGH (ref 4.0–10.5)
nRBC: 0 % (ref 0.0–0.2)

## 2022-05-15 LAB — HEPATIC FUNCTION PANEL
ALT: 20 U/L (ref 0–44)
AST: 27 U/L (ref 15–41)
Albumin: 2.3 g/dL — ABNORMAL LOW (ref 3.5–5.0)
Alkaline Phosphatase: 79 U/L (ref 38–126)
Bilirubin, Direct: 0.3 mg/dL — ABNORMAL HIGH (ref 0.0–0.2)
Indirect Bilirubin: 0.7 mg/dL (ref 0.3–0.9)
Total Bilirubin: 1 mg/dL (ref 0.3–1.2)
Total Protein: 5 g/dL — ABNORMAL LOW (ref 6.5–8.1)

## 2022-05-15 LAB — TROPONIN I (HIGH SENSITIVITY)
Troponin I (High Sensitivity): 7 ng/L (ref <18)
Troponin I (High Sensitivity): 6 ng/L (ref <18)

## 2022-05-15 LAB — LIPASE, BLOOD: Lipase: 26 U/L (ref 11–51)

## 2022-05-15 MED ORDER — ONDANSETRON 4 MG PO TBDP: 4.0000 mg | ORAL_TABLET | Freq: Three times a day (TID) | ORAL | 0 refills | Status: DC | PRN

## 2022-05-15 MED ORDER — SODIUM CHLORIDE 0.9 % IV SOLN
1.0000 g | Freq: Once | INTRAVENOUS | Status: AC
  Administered 2022-05-15: 1 g via INTRAVENOUS
  Filled 2022-05-15: qty 10

## 2022-05-15 MED ORDER — SODIUM CHLORIDE 0.9 % IV BOLUS
1000.0000 mL | Freq: Once | INTRAVENOUS | Status: AC
  Administered 2022-05-15: 1000 mL via INTRAVENOUS

## 2022-05-15 MED ORDER — CEPHALEXIN 500 MG PO CAPS: 500.0000 mg | ORAL_CAPSULE | Freq: Four times a day (QID) | ORAL | 0 refills | Status: DC

## 2022-05-15 MED ORDER — IOHEXOL 350 MG/ML SOLN
75.0000 mL | Freq: Once | INTRAVENOUS | Status: AC | PRN
  Administered 2022-05-15: 75 mL via INTRAVENOUS

## 2022-05-15 MED ORDER — SODIUM CHLORIDE 0.9 % IV SOLN: INTRAVENOUS | Status: DC

## 2022-05-15 NOTE — ED Provider Notes (Addendum)
[facility] Provider Note   CSN: [id number] Arrival date & time: 05/15/22  [id number]     History  Chief Complaint  Patient presents with   Chest Pain   Emesis    [name] is a 72 y.o. female.  Patient with a complaint of upper substernal chest pain since yesterday evening.  Patient saw her primary care doctor yesterday for nausea and vomiting and some diarrhea type illness.  She got a shot of Phenergan.  Although symptoms kind of started on Saturday.  Patient with a complaint of generalized abdominal pain as well.  Past medical history significant for mitral valve replacement.  Gallbladder surgery.  History of coronary artery disease had a balloon done many years ago looks like 2009.  For an MI.  Also has a history of gastroesophageal reflux disease hyperlipidemia.  Patient is never used tobacco products.  Patient states that the chest pressure is 7 out of 10.  In addition patient states that she has not voided in 2 days.  Not able to keep anything down.       Home Medications Prior to Admission medications   Medication Sig Start Date End Date Taking? Authorizing Provider  amoxicillin-clavulanate (AUGMENTIN) 875-125 MG tablet Take 1 tablet by mouth 2 (two) times daily. 04/29/22   Mar Daring, PA-C  aspirin 81 MG EC tablet Take 81 mg by mouth at bedtime.    [provider]  cyanocobalamin (VITAMIN B12) 1000 MCG/ML injection 1000 mcg (1 mg) injection once per week for four weeks, followed by 1000 mcg injection once per month. 04/30/22   [name], MD  Evolocumab (REPATHA SURECLICK) [id number] MG/ML SOAJ Inject 140 mg into the skin every 14 (fourteen) days. 05/04/22   Minus Breeding, MD  levothyroxine (SYNTHROID) 100 MCG tablet Take 1 tablet by mouth once daily 05/07/22   [name], MD  levothyroxine (SYNTHROID) 100 MCG tablet Take 1 tablet (100 mcg total) by mouth daily. 05/07/22   [name], MD  pantoprazole  (PROTONIX) 40 MG tablet Take 1 tablet by mouth once daily 05/07/22   [name], MD  pantoprazole (PROTONIX) 40 MG tablet Take 1 tablet (40 mg total) by mouth daily. 05/07/22   [name], MD  PARoxetine (PAXIL) 20 MG tablet TAKE 1 TABLET BY MOUTH ONCE DAILY IN THE MORNING 05/07/22   [name], MD  PARoxetine (PAXIL) 20 MG tablet Take 1 tablet (20 mg total) by mouth every morning. 05/07/22   [name], MD  predniSONE (DELTASONE) 20 MG tablet Take 2 tablets (40 mg total) by mouth daily with breakfast. 04/29/22   Mar Daring, PA-C  predniSONE (DELTASONE) 5 MG tablet TAKE 1 TABLET(5 MG) BY MOUTH DAILY WITH BREAKFAST 03/26/22   Ofilia Neas, PA-C  promethazine (PHENERGAN) 50 MG suppository Place 1 suppository (50 mg total) rectally every 6 (six) hours as needed for nausea or vomiting. 05/14/22   Jeanie Sewer, NP  traZODone (DESYREL) 50 MG tablet Take 0.5-1 tablets (25-50 mg total) by mouth at bedtime as needed for sleep. 02/06/22   [name], MD      Allergies    Morphine and related, Prochlorperazine, Simvastatin, and Sulfa antibiotics    Review of Systems   Review of Systems  Constitutional:  Negative for chills and fever.  HENT:  Negative for ear pain and sore throat.   Eyes:  Negative for pain and visual disturbance.  Respiratory:  Negative for cough and shortness of breath.   Cardiovascular:  Positive for chest pain. Negative for palpitations.  Gastrointestinal:  Positive for abdominal pain, diarrhea, nausea and vomiting.  Genitourinary:  Positive for difficulty urinating. Negative for dysuria and hematuria.  Musculoskeletal:  Negative for arthralgias and back pain.  Skin:  Negative for color change and rash.  Neurological:  Negative for seizures and syncope.  All other systems reviewed and are negative.   Physical Exam Updated Vital Signs BP (!) 145/116   Pulse 76   Temp 97.8 F (36.6 C) (Oral)   Resp (!) 23   Ht 1.562 m (5' 1.5")    Wt 68.5 kg   SpO2 97%   BMI 28.07 kg/m  Physical Exam Vitals and nursing note reviewed.  Constitutional:      General: She is not in acute distress.    Appearance: Normal appearance. She is well-developed.  HENT:     Head: Normocephalic and atraumatic.     Mouth/Throat:     Mouth: Mucous membranes are dry.     Comments: Mucous membranes dry. Eyes:     Extraocular Movements: Extraocular movements intact.     Conjunctiva/sclera: Conjunctivae normal.     Pupils: Pupils are equal, round, and reactive to light.  Cardiovascular:     Rate and Rhythm: Normal rate and regular rhythm.     Heart sounds: No murmur heard. Pulmonary:     Effort: Pulmonary effort is normal. No respiratory distress.     Breath sounds: Normal breath sounds.  Abdominal:     Palpations: Abdomen is soft.     Tenderness: There is abdominal tenderness. There is no guarding.  Musculoskeletal:        General: No swelling.     Cervical back: Normal range of motion and neck supple.  Skin:    General: Skin is warm and dry.     Capillary Refill: Capillary refill takes less than 2 seconds.  Neurological:     General: No focal deficit present.     Mental Status: She is alert and oriented to person, place, and time.  Psychiatric:        Mood and Affect: Mood normal.     ED Results / Procedures / Treatments   Labs (all labs ordered are listed, but only abnormal results are displayed) Labs Reviewed  BASIC METABOLIC PANEL - Abnormal; Notable for the following components:      Result Value   CO2 19 (*)    Glucose, Bld 122 (*)    Creatinine, Ser 1.04 (*)    Calcium 7.5 (*)    GFR, Estimated 57 (*)    All other components within normal limits  CBC - Abnormal; Notable for the following components:   WBC 10.8 (*)    All other components within normal limits  HEPATIC FUNCTION PANEL - Abnormal; Notable for the following components:   Total Protein 5.0 (*)    Albumin 2.3 (*)    Bilirubin, Direct 0.3 (*)    All  other components within normal limits  URINALYSIS, ROUTINE W REFLEX MICROSCOPIC - Abnormal; Notable for the following components:   APPearance HAZY (*)    Specific Gravity, Urine 1.040 (*)    Hgb urine dipstick MODERATE (*)    Ketones, ur 5 (*)    Protein, ur 30 (*)    Nitrite POSITIVE (*)    Leukocytes,Ua LARGE (*)    Bacteria, UA MANY (*)    All other components within normal limits  LIPASE, BLOOD  TROPONIN I (HIGH SENSITIVITY)  TROPONIN I (HIGH SENSITIVITY)    EKG EKG Interpretation  Date/Time:  Tuesday May 15 2022 07:09:34 EDT Ventricular Rate:  83 PR Interval:  150 QRS Duration: 74 QT Interval:  379 QTC Calculation: 446 R Axis:   -5 Text Interpretation: Sinus rhythm Low voltage, precordial leads Nonspecific T abnrm, anterolateral leads Confirmed by Fredia Sorrow [phone]) on 05/15/2022 7:24:12 AM  Radiology CT CHEST ABDOMEN PELVIS W CONTRAST  Result Date: 05/15/2022 CLINICAL DATA:  Acute abdominal pain.  Concern for pneumonia. EXAM: CT CHEST, ABDOMEN, AND PELVIS WITH CONTRAST TECHNIQUE: Multidetector CT imaging of the chest, abdomen and pelvis was performed following the standard protocol during bolus administration of intravenous contrast. RADIATION DOSE REDUCTION: This exam was performed according to the departmental dose-optimization program which includes automated exposure control, adjustment of the mA and/or kV according to patient size and/or use of iterative reconstruction technique. CONTRAST:  75 cc Omnipaque COMPARISON:  08/10/2020 FINDINGS: CT CHEST FINDINGS Cardiovascular: No significant vascular findings. Normal heart size. No pericardial effusion. Mediastinum/Nodes: No axillary or supraclavicular adenopathy. No mediastinal or hilar adenopathy. No pericardial fluid. Esophagus normal. Small hiatal hernia. Lungs/Pleura: No airspace disease.  No pleural fluid. Musculoskeletal: Degenerative osteophytosis of the spine. RIGHT shoulder prosthetic CT ABDOMEN AND PELVIS  FINDINGS Hepatobiliary: No focal hepatic lesion. No biliary ductal dilatation. Gallbladder is normal. Common bile duct is normal. Pancreas: Pancreas is normal. No ductal dilatation. No pancreatic inflammation. Spleen: Normal spleen Adrenals/urinary tract: Adrenal glands and kidneys are normal. The ureters and bladder normal. Stomach/Bowel: Small hiatal hernia. The stomach, duodenum, and small bowel normal. Appendix not identified. No secondary signs of appendicitis. The colon and rectosigmoid colon are normal. Vascular/Lymphatic: Abdominal aorta is normal caliber. There is no retroperitoneal or periportal lymphadenopathy. No pelvic lymphadenopathy. Reproductive: Uterus and adnexa unremarkable. Other: No free fluid. Musculoskeletal: No aggressive osseous lesion. IMPRESSION: CHEST IMPRESSION: 1. No evidence of pneumonia. 2. Small hiatal hernia. PELVIS IMPRESSION: No acute findings in the abdomen pelvis. Degenerative osteophytosis of the spine. Electronically Signed   By: Suzy Bouchard M.D.   On: 05/15/2022 09:53   DG Chest 2 View  Result Date: 05/15/2022 CLINICAL DATA:  One day history of chest pain EXAM: CHEST - 2 VIEW COMPARISON:  Chest CT dated 08/09/2020, chest radiograph dated 04/22/2015 FINDINGS: Low lung volumes. Left basilar patchy opacities. No pleural effusion or pneumothorax. Similar cardiomediastinal silhouette. Status post right shoulder arthroplasty. Unchanged wedging of L1. IMPRESSION: Low lung volumes with left basilar patchy opacities, which may reflect atelectasis. Aspiration or pneumonia can be considered in the appropriate clinical setting. Electronically Signed   By: Darrin Nipper M.D.   On: 05/15/2022 08:30    Procedures Procedures    Medications Ordered in ED Medications  0.9 %  sodium chloride infusion ( Intravenous New Bag/Given 05/15/22 0836)  cefTRIAXone (ROCEPHIN) 1 g in sodium chloride 0.9 % 100 mL IVPB (has no administration in time range)  sodium chloride 0.9 % bolus 1,000 mL  (0 mLs Intravenous Stopped 05/15/22 1040)  iohexol (OMNIPAQUE) 350 MG/ML injection 75 mL (75 mLs Intravenous Contrast Given 05/15/22 [id number])    ED Course/ Medical Decision Making/ A&P                             Medical Decision Making Amount and/or Complexity of Data Reviewed Labs: ordered. Radiology: ordered.  Risk Prescription drug management.   Patient is initial labs basic metabolic panel CO2 19  glucose 122 creatinine elevated at 1.04 GFR is 57 which is reassuring with her history of not voiding for 2 days.  CBC white count 10.8 hemoglobin 13.1 platelets 245.  Initial troponin reassuring at 7.  Will need delta troponin.  Patient also has chest x-ray and CT abdomen pelvis pending has lipase pending and hepatic panel pending.  The chest x-ray shows low lung volumes left basilar patchy opacities which may reflect atelectasis aspiration or pneumonia.  Based on this we will add on CT of the chest as well.  Workup from a cardiac standpoint without any concerns troponins x 2 were normal.  Liver function test without significant abnormalities.  CT abdomen pelvis and chest without any acute findings no evidence of any pneumonia in the chest.  So that is ruled out no acute findings in the abdomen or pelvis.  Urinalysis seems to be consistent with urinary tract infection will send for urine culture and will treat here with Rocephin and discharged home with Keflex.  Patient's renal function is reasonable.  And patient able to void here.  No evidence of acute kidney injury.   Patient feeling much better.   Final Clinical Impression(s) / ED Diagnoses Final diagnoses:  Precordial pain  Nausea vomiting and diarrhea  Generalized abdominal pain  Dehydration  Acute cystitis without hematuria    Rx / DC Orders ED Discharge Orders     None         Fredia Sorrow, MD 05/15/22 [id number]    Fredia Sorrow, MD 05/15/22 1105    Fredia Sorrow, MD 05/15/22 1113

## 2022-05-15 NOTE — Discharge Instructions (Addendum)
Take the antinausea medicine.  Take the antibiotic Keflex as directed.  Make an appointment follow-up with your primary care doctor to have the urine rechecked.  Urine has been sent for culture which will help determine if this is an appropriate antibiotic to handle the infection.  Return for new or worse symptoms return for persistent vomiting which is prevents you from keeping the antibiotic down.  Workup for the heart stuff was all normal.  No evidence of any acute heart event.

## 2022-05-15 NOTE — ED Triage Notes (Signed)
PT BIB [facility] for 7/10 Chest Pressure beginning yesterday afternoon.  PT has not voided since Sun but she associates this with N/V that began on Sat.  Last emesis 15 min before EMS arrived. Pt saw her PCP yesterday, was given phenergan and sent home.   EMS gave 1 Nitro with no relief.  324 ASA, 4 Zofran and 300 mL of NS.   134/85, 96% HR 100, CBG 159

## 2022-05-15 NOTE — ED Notes (Signed)
Patient transported to CT 

## 2022-05-15 NOTE — ED Notes (Signed)
Pt ambulated to restroom. 

## 2022-05-15 NOTE — ED Notes (Signed)
DC instructions reviewed with pt. Pt verbalized understanding.  PT DC.  

## 2022-05-18 ENCOUNTER — Telehealth: Payer: Self-pay

## 2022-05-18 LAB — URINE CULTURE: Culture: >=100000 — AB

## 2022-05-18 NOTE — Telephone Encounter (Signed)
     Patient  visit on [place]   at 4/2   Have you been able to follow up with your primary care physician? Yes   The patient was or was not able to obtain any needed medicine or equipment. Yes   Are there diet recommendations that you are having difficulty following? Na   Patient expresses understanding of discharge instructions and education provided has no other needs at this time.  Yes    Lenard Forth [facility] Guide, [facility] [phone] [address] [place], [place], [place] [address] Phone: [phone] Email: [email or web address]

## 2022-05-19 ENCOUNTER — Telehealth (HOSPITAL_BASED_OUTPATIENT_CLINIC_OR_DEPARTMENT_OTHER): Payer: Self-pay | Admitting: *Deleted

## 2022-05-19 NOTE — Telephone Encounter (Signed)
Post ED Visit - Positive Culture Follow-up  Culture report reviewed by antimicrobial stewardship pharmacist: [facility] []  Enzo Bi, Pharm.D. []  Celedonio Miyamoto, Pharm.D., BCPS AQ-ID []  Garvin Fila, Pharm.D., BCPS []  Georgina Pillion, Pharm.D., BCPS []  George, [place].D., BCPS, AAHIVP []  Estella Husk, Pharm.D., BCPS, AAHIVP []  Lysle Pearl, PharmD, BCPS []  Phillips Climes, PharmD, BCPS []  Agapito Games, PharmD, BCPS []  Verlan Friends, PharmD []  Mervyn Gay, PharmD, BCPS []  Vinnie Level, PharmD  [facility] []  Len Childs, PharmD []  Greer Pickerel, PharmD []  Adalberto Cole, PharmD []  Perlie Gold, Rph []  Lonell Face) Jean Rosenthal, PharmD []  Earl Many, PharmD []  Junita Push, PharmD []  Dorna Leitz, PharmD []  Terrilee Files, PharmD []  Lynann Beaver, PharmD []  Keturah Barre, PharmD []  Loralee Pacas, PharmD []  Bernadene Person, PharmD   Positive urine culture Treated with Cephalexin, organism sensitive to the same and no further patient follow-up is required at this time.  Cherylin Mylar, PharnD  Virl Axe [place]ingo 05/19/2022, 11:07 AM

## 2022-05-22 ENCOUNTER — Other Ambulatory Visit (HOSPITAL_COMMUNITY): Payer: Self-pay

## 2022-05-24 ENCOUNTER — Other Ambulatory Visit (HOSPITAL_COMMUNITY): Payer: Self-pay

## 2022-05-24 ENCOUNTER — Telehealth: Payer: Self-pay

## 2022-05-24 NOTE — Telephone Encounter (Signed)
Patient Advocate Encounter   Received notification from [employer] that prior authorization is required for Promethazine HCl 50MG  suppositories   Submitted: 05-24-2022 Key [id number]  Status is pending

## 2022-06-02 NOTE — Telephone Encounter (Signed)
PA Denial: Full letter in Media

## 2022-06-05 NOTE — Telephone Encounter (Signed)
I called pt, pt states she is better now after going to the ED.

## 2022-06-12 ENCOUNTER — Other Ambulatory Visit: Payer: Self-pay | Admitting: Physician Assistant

## 2022-06-12 NOTE — Telephone Encounter (Signed)
Last Fill: 03/26/2022  Next Visit: 07/10/2022  Last Visit: 04/11/2022  Dx:  Polymyalgia rheumatica   Current Dose per office note on 04/11/2022: Prednisone 5 mg p.o. daily. Unable to taper.   Okay to refill prednisone?

## 2022-06-26 NOTE — Progress Notes (Signed)
Office Visit Note  Patient: [name]             Date of Birth: 1950-03-25           MRN: [id number]             PCP: Shelva Majestic, MD Referring: Shelva Majestic, MD Visit Date: 07/10/2022 Occupation: @GUAROCC @  Subjective:  Medication monitoring-reclast   History of Present Illness: [name] is a 72 y.o. female with history of osteoporosis and polymyalgia rheumatica.  She remains on prednisone 5 mg daily and has been unable to taper.  She denies any signs or symptoms of a polymyalgia rheumatica flare recently.  Patient states that she has been experiencing increased discomfort in her lower back.  She has not followed up with Dr. Ophelia Charter recently.  She has been taking ibuprofen on a daily basis for pain relief and also rest and use heat for symptomatic relief.  She denies any symptoms of radiculopathy at this time.  She continues to have ongoing pain in both hands due to ongoing osteoarthritis. Patient was initiated on IV Reclast on 05/07/2022.  She tolerated the infusion without any side effects.  She denies any recent falls or fractures.  She is taking a calcium vitamin D supplement daily.   Activities of Daily Living:  Patient reports morning stiffness for 30-60 minutes.   Patient Reports nocturnal pain.  Difficulty dressing/grooming: Denies Difficulty climbing stairs: Denies Difficulty getting out of chair: Denies Difficulty using hands for taps, buttons, cutlery, and/or writing: Denies  Review of Systems  Constitutional:  Positive for fatigue.  HENT:  Negative for mouth sores and mouth dryness.   Eyes:  Negative for dryness.  Respiratory:  Negative for shortness of breath.   Cardiovascular:  Negative for chest pain and palpitations.  Gastrointestinal:  Negative for blood in stool, constipation and diarrhea.  Endocrine: Negative for increased urination.  Genitourinary:  Negative for involuntary urination.  Musculoskeletal:  Positive for joint pain, joint pain,  joint swelling, myalgias, muscle weakness, morning stiffness, muscle tenderness and myalgias. Negative for gait problem.  Skin:  Negative for color change, rash, hair loss and sensitivity to sunlight.  Allergic/Immunologic: Negative for susceptible to infections.  Neurological:  Negative for dizziness and headaches.  Hematological:  Negative for swollen glands.  Psychiatric/Behavioral:  Positive for depressed mood and sleep disturbance. The patient is nervous/anxious.     PMFS History:  Patient Active Problem List   Diagnosis Date Noted   B12 deficiency 04/26/2022   Hyperglycemia 04/25/2022   Unilateral primary osteoarthritis, left hip 08/24/2021   Shoulder fracture 08/10/2020   Lumbar compression fracture (HCC) 05/11/2019   Dyslipidemia 01/26/2019   Drug-induced myopathy 01/14/2019   Haglund's deformity 01/12/2019   High risk medication use 12/04/2018   Lower esophageal ring (Schatzki) 03/03/2018   Osteoarthritis of right hip 12/02/2017   GERD (gastroesophageal reflux disease) 12/15/2015   Polyp of colon, adenomatous 10/11/2014   Vitamin D deficiency 05/26/2014   Osteoporosis 05/26/2014   Polymyalgia rheumatica (HCC) 12/01/2010   CAD (coronary artery disease) 09/09/2007   Hematuria 09/09/2007   Depression 03/20/2007   Hypothyroid 02/13/1999    Past Medical History:  Diagnosis Date   Anxiety    Arthritis    CAD (coronary artery disease)    Cataract    Depression    DISORDER, MENOPAUSAL NOS 03/20/2007   Qualifier: Diagnosis of  By: Lovell Sheehan MD, Balinda Quails    GERD (gastroesophageal reflux disease)    Hyperlipidemia  Hypothyroidism    MYOCARDIAL INFARCTION, HX OF 09/09/2007   Qualifier: Diagnosis of  By: Cato Mulligan MD, Bruce     Osteoporosis    Polymyalgia rheumatica (HCC)     Family History  Problem Relation Age of Onset   Hypertension Father    Heart disease Father        Mi age 73, 93, 99 and 60.   Cancer Father    Hyperlipidemia Father    Colon cancer Maternal Uncle     Hypertension Mother    Cancer Mother    Hyperlipidemia Mother    Pulmonary fibrosis Sister    Epilepsy Son    Healthy Son    Myasthenia gravis Son    Healthy Son    Rheum arthritis Niece    Lupus Niece    Rheum arthritis Niece    Past Surgical History:  Procedure Laterality Date   CATARACT EXTRACTION     CESAREAN SECTION     CHOLECYSTECTOMY N/A 11/24/2012   Procedure: LAPAROSCOPIC CHOLECYSTECTOMY;  Surgeon: Axel Filler, MD;  Location: [facility] OR;  Service: General;  Laterality: N/A;   COSMETIC SURGERY     EYE SURGERY     eye lids lifted   PTCA     REVERSE SHOULDER ARTHROPLASTY Right 08/12/2020   Procedure: REVERSE SHOULDER ARTHROPLASTY;  Surgeon: Yolonda Kida, MD;  Location: [facility] OR;  Service: Orthopedics;  Laterality: Right;   Social History   Social History Narrative   Husband and 2 adult children live at home. No grandkids. Son with epilepsy.      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     Immunization History  Administered Date(s) Administered   Fluad Quad(high Dose 65+) 11/10/2019, 11/21/2020, 11/28/2021   Influenza Whole 11/09/2008   Influenza, High Dose Seasonal PF 10/30/2016, 12/02/2017, 11/08/2018   Influenza-Unspecified 11/08/2018   PFIZER Comirnaty(Gray Top)Covid-19 Tri-Sucrose Vaccine 11/16/2021   PFIZER(Purple Top)SARS-COV-2 Vaccination 02/26/2019, 03/20/2019, 11/28/2019   PNEUMOCOCCAL CONJUGATE-20 06/21/2020   Pfizer Covid-19 Vaccine Bivalent Booster 23yrs & up 11/21/2020, 07/22/2021   Pneumococcal Conjugate-13 10/11/2014   Pneumococcal Polysaccharide-23 12/15/2015   Rsv, Bivalent, Protein Subunit Rsvpref,pf Verdis Frederickson) 11/16/2021   Td 02/12/2006   Td (Adult), 2 Lf Tetanus Toxid, Preservative Free 02/12/2006   Tdap 04/26/2017   Zoster Recombinat (Shingrix) 07/18/2021, 09/19/2021   Zoster, Live 09/19/2010     Objective: Vital Signs: BP (!) 158/88 (BP Location: Left Arm, Patient Position: Sitting, Cuff Size: Normal)   Pulse 71   Resp 15   Ht  5' 1.5" (1.562 m)   Wt 158 lb (71.7 kg)   BMI 29.37 kg/m    Physical Exam Vitals and nursing note reviewed.  Constitutional:      Appearance: She is well-developed.  HENT:     Head: Normocephalic and atraumatic.  Eyes:     Conjunctiva/sclera: Conjunctivae normal.  Cardiovascular:     Rate and Rhythm: Normal rate and regular rhythm.     Heart sounds: Normal heart sounds.  Pulmonary:     Effort: Pulmonary effort is normal.     Breath sounds: Normal breath sounds.  Abdominal:     General: Bowel sounds are normal.     Palpations: Abdomen is soft.  Musculoskeletal:     Cervical back: Normal range of motion.  Lymphadenopathy:     Cervical: No cervical adenopathy.  Skin:    General: Skin is warm and dry.     Capillary Refill: Capillary refill takes less than 2 seconds.  Neurological:  Mental Status: She is alert and oriented to person, place, and time.  Psychiatric:        Behavior: Behavior normal.      Musculoskeletal Exam: C-spine limited ROM with lateral rotation.  Thoracic kyphosis.  Limited mobility of lumbar spine.  Midline spinal tenderness in the lumbar. Limited abduction and internal rotation of the right shoulder.  Left shoulder has good ROM.  Elbow joints, wrist joints, MCPs, PIPs, and DIPs good ROM with no synovitis.  PIP and DIP thickening consistent with OA of both hands.  Hip joints have good ROM with no groin pain.  Tenderness of the right trochanteric bursa.  Knee joints have good ROM with no warmth or effusion.  Ankle joints have good ROM with no tenderness or joint swelling.   CDAI Exam: CDAI Score: -- Patient Global: --; Provider Global: -- Swollen: --; Tender: -- Joint Exam 07/10/2022   No joint exam has been documented for this visit   There is currently no information documented on the homunculus. Go to the Rheumatology activity and complete the homunculus joint exam.  Investigation: No additional findings.  Imaging: No results found.  Recent  Labs: Lab Results  Component Value Date   WBC 10.8 (H) 05/15/2022   HGB 13.1 05/15/2022   PLT 245 05/15/2022   NA 137 05/15/2022   K 3.5 05/15/2022   CL 107 05/15/2022   CO2 19 (L) 05/15/2022   GLUCOSE 122 (H) 05/15/2022   BUN 19 05/15/2022   CREATININE 1.04 (H) 05/15/2022   BILITOT 1.0 05/15/2022   ALKPHOS 79 05/15/2022   AST 27 05/15/2022   ALT 20 05/15/2022   PROT 5.0 (L) 05/15/2022   ALBUMIN 2.3 (L) 05/15/2022   CALCIUM 7.5 (L) 05/15/2022   GFRAA 75 04/22/2020   QFTBGOLDPLUS NEGATIVE 08/12/2018    Speciality Comments: Forteo October 2021-February 2022 restarted June 2022 (missed 3 weeks due to fracture)  Should be completing Forteo in December 2023  Procedures:  No procedures performed Allergies: Morphine and codeine, Prochlorperazine, Simvastatin, and Sulfa antibiotics        Assessment / Plan:     Visit Diagnoses: Age-related osteoporosis without current pathological fracture: Previous DEXA June 08, 2019 T score -2.5 left femoral neck, BMD 0.575% change 1%.   Forteo started October 2021 (gap February 22 till June 22).  Completed Forteo in January 2024.   DEXA updated on 08/07/2021: Bilateral femoral neck BMD 0.619 with T-score -2.1.  Statistically significant increase in BMD of left hip and AP spine.  Right hip is stable.  Results were reviewed.   IV reclast started on 05/07/22.  She tolerated reclast without any side effects.  She has been taking a calcium and vitamin D supplement daily.  She has not had any recent falls or fractures.  Her next Reclast infusion will be due in March 2025.  Her next bone density will be due in June 2025. She will follow up in 6 months or sooner if needed.   History of compression fracture of spine: Previous L1 compression fracture with 60% vertebral body height loss.   Vitamin D deficiency: Vitamin D 38 on 04/11/22.   Polymyalgia rheumatica (HCC) - DXd 2015 by another rheumatologist.  She has not had any signs or symptoms of a  polymyalgia rheumatica flare.  She has been taking prednisone 5 mg daily.  She has been unable to taper off of prednisone.  She is aware of the risks of long-term prednisone use. She was advised to notify us if  she develops signs or symptoms of a polymyalgia rheumatica flare.  Long term (current) use of systemic steroids - Prednisone 5 mg daily.  Unable to taper.  long-term use of steroids:HTN, diabetes, weight gain, osteoporosis, skin thinning, AVN, hyperlipidemia.   High risk medication use: Discontinued methotrexate in April 2022 as she did not notice any benefit.  She is not taking any immunosuppressive agents at this time.   S/P shoulder replacement, right: Limited abduction and internal rotation.  Unable to lay on right side at night.   Primary osteoarthritis of both hands: PIP and DIP thickening consistent with OA of both hands.  Discussed the importance of joint protection and muscle strengthening.  Primary osteoarthritis of right hip: She has good range of motion of both hip joints with no groin pain currently.  She has tenderness over the right trochanteric bursa on examination today.  Trochanteric bursitis, right hip: She has tenderness over the right trochanteric bursa today.  DDD (degenerative disc disease), lumbar - Dr. Bynum Bellows pain. No symptoms of radiculopathy.  She has been taking ibuprofen and using heat for symptomatic relief.  Encourage patient to follow-up with Dr. Ophelia Charter and or Dr. Alvester Morin if her symptoms persist or worsen.  Primary osteoarthritis of both feet: She has PIP and DIP thickening consistent with osteoarthritis of both feet.  Ankle joints have good range of motion with no tenderness or synovitis.  She is wearing proper fitting shoes.  Other medical conditions are listed as follows:   History of gastroesophageal reflux (GERD): Plan to avoid oral bisphosphonates.   Coronary artery disease involving native coronary artery of native heart without angina  pectoris  Adenomatous polyp of colon, unspecified part of colon  History of hypothyroidism  History of depression  Orders: No orders of the defined types were placed in this encounter.  No orders of the defined types were placed in this encounter.    Follow-Up Instructions: Return in about 6 months (around 01/10/2023) for Osteoporosis, Polymyalgia Rheumatica.   Gearldine Bienenstock, PA-C  Note - This record has been created using Dragon software.  Chart creation errors have been sought, but may not always  have been located. Such creation errors do not reflect on  the standard of medical care.

## 2022-07-10 ENCOUNTER — Ambulatory Visit: Payer: PPO | Attending: Physician Assistant | Admitting: Physician Assistant

## 2022-07-10 ENCOUNTER — Encounter: Payer: Self-pay | Admitting: Cardiology

## 2022-07-10 ENCOUNTER — Encounter: Payer: Self-pay | Admitting: Physician Assistant

## 2022-07-10 VITALS — BP 158/88 | HR 71 | Resp 15 | Ht 61.5 in | Wt 158.0 lb

## 2022-07-10 DIAGNOSIS — M19041 Primary osteoarthritis, right hand: Secondary | ICD-10-CM

## 2022-07-10 DIAGNOSIS — Z8719 Personal history of other diseases of the digestive system: Secondary | ICD-10-CM

## 2022-07-10 DIAGNOSIS — D126 Benign neoplasm of colon, unspecified: Secondary | ICD-10-CM

## 2022-07-10 DIAGNOSIS — M353 Polymyalgia rheumatica: Secondary | ICD-10-CM

## 2022-07-10 DIAGNOSIS — Z7952 Long term (current) use of systemic steroids: Secondary | ICD-10-CM

## 2022-07-10 DIAGNOSIS — Z79899 Other long term (current) drug therapy: Secondary | ICD-10-CM | POA: Diagnosis not present

## 2022-07-10 DIAGNOSIS — M5136 Other intervertebral disc degeneration, lumbar region: Secondary | ICD-10-CM

## 2022-07-10 DIAGNOSIS — M7062 Trochanteric bursitis, left hip: Secondary | ICD-10-CM

## 2022-07-10 DIAGNOSIS — Z96611 Presence of right artificial shoulder joint: Secondary | ICD-10-CM | POA: Diagnosis not present

## 2022-07-10 DIAGNOSIS — M19071 Primary osteoarthritis, right ankle and foot: Secondary | ICD-10-CM

## 2022-07-10 DIAGNOSIS — E559 Vitamin D deficiency, unspecified: Secondary | ICD-10-CM

## 2022-07-10 DIAGNOSIS — Z8659 Personal history of other mental and behavioral disorders: Secondary | ICD-10-CM

## 2022-07-10 DIAGNOSIS — M1611 Unilateral primary osteoarthritis, right hip: Secondary | ICD-10-CM | POA: Diagnosis not present

## 2022-07-10 DIAGNOSIS — M19072 Primary osteoarthritis, left ankle and foot: Secondary | ICD-10-CM

## 2022-07-10 DIAGNOSIS — Z8781 Personal history of (healed) traumatic fracture: Secondary | ICD-10-CM

## 2022-07-10 DIAGNOSIS — I2510 Atherosclerotic heart disease of native coronary artery without angina pectoris: Secondary | ICD-10-CM

## 2022-07-10 DIAGNOSIS — M19042 Primary osteoarthritis, left hand: Secondary | ICD-10-CM

## 2022-07-10 DIAGNOSIS — M810 Age-related osteoporosis without current pathological fracture: Secondary | ICD-10-CM | POA: Diagnosis not present

## 2022-07-10 DIAGNOSIS — M51369 Other intervertebral disc degeneration, lumbar region without mention of lumbar back pain or lower extremity pain: Secondary | ICD-10-CM

## 2022-07-10 DIAGNOSIS — Z8639 Personal history of other endocrine, nutritional and metabolic disease: Secondary | ICD-10-CM

## 2022-07-16 ENCOUNTER — Ambulatory Visit (INDEPENDENT_AMBULATORY_CARE_PROVIDER_SITE_OTHER): Payer: PPO | Admitting: Student

## 2022-07-16 ENCOUNTER — Encounter (HOSPITAL_BASED_OUTPATIENT_CLINIC_OR_DEPARTMENT_OTHER): Payer: Self-pay | Admitting: Student

## 2022-07-16 ENCOUNTER — Ambulatory Visit (HOSPITAL_BASED_OUTPATIENT_CLINIC_OR_DEPARTMENT_OTHER): Payer: PPO

## 2022-07-16 DIAGNOSIS — M25571 Pain in right ankle and joints of right foot: Secondary | ICD-10-CM

## 2022-07-16 DIAGNOSIS — M19071 Primary osteoarthritis, right ankle and foot: Secondary | ICD-10-CM | POA: Diagnosis not present

## 2022-07-16 NOTE — Progress Notes (Signed)
Chief Complaint: Right ankle pain     History of Present Illness:    [name] is a 72 y.o. female with history of osteoporosis presenting to clinic for evaluation of pain in the right ankle.  She states that she stepped in a hole while working in the yard 2 days ago and her ankle turned inward.  She has since had moderate lateral sided ankle pain extending down into the foot.  She has been taking ibuprofen as well as using ice.  She does note some pain with weightbearing however she is still able to get around without use of assistive device.  Overall her pain has slightly improved since the injury, however she would like to ensure that there is no fracture present.  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   Surgical History:   None  PMH/PSH/Family History/Social History/Meds/Allergies:    Past Medical History:  Diagnosis Date   Anxiety    Arthritis    CAD (coronary artery disease)    Cataract    Depression    DISORDER, MENOPAUSAL NOS 03/20/2007   Qualifier: Diagnosis of  By: Lovell Sheehan MD, Balinda Quails    GERD (gastroesophageal reflux disease)    Hyperlipidemia    Hypothyroidism    MYOCARDIAL INFARCTION, HX OF 09/09/2007   Qualifier: Diagnosis of  By: Cato Mulligan MD, Bruce     Osteoporosis    Polymyalgia rheumatica [facility])    Past Surgical History:  Procedure Laterality Date   CATARACT EXTRACTION     CESAREAN SECTION     CHOLECYSTECTOMY N/A 11/24/2012   Procedure: LAPAROSCOPIC CHOLECYSTECTOMY;  Surgeon: Axel Filler, MD;  Location: [facility] OR;  Service: General;  Laterality: N/A;   COSMETIC SURGERY     EYE SURGERY     eye lids lifted   PTCA     REVERSE SHOULDER ARTHROPLASTY Right 08/12/2020   Procedure: REVERSE SHOULDER ARTHROPLASTY;  Surgeon: Yolonda Kida, MD;  Location: [facility] OR;  Service: Orthopedics;  Laterality: Right;   Social History   Socioeconomic History   Marital status: Married    Spouse name: Not on file   Number of  children: Not on file   Years of education: Not on file   Highest education level: Not on file  Occupational History   Not on file  Tobacco Use   Smoking status: Never    Passive exposure: Never   Smokeless tobacco: Never  Vaping Use   Vaping Use: Never used  Substance and Sexual Activity   Alcohol use: No    Alcohol/week: 0.0 standard drinks of alcohol   Drug use: No   Sexual activity: Not on file  Other Topics Concern   Not on file  Social History Narrative   Husband and 2 adult children live at home. No grandkids. Son with epilepsy.      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     Social Determinants of Health   Financial Resource Strain: Low Risk  (03/20/2022)   Overall Financial Resource Strain (CARDIA)    Difficulty of Paying Living Expenses: Not hard at all  Food Insecurity: No Food Insecurity (03/20/2022)   Hunger Vital Sign    Worried About Running Out of Food in the Last Year: Never true    Ran Out of Food  in the Last Year: Never true  Transportation Needs: No Transportation Needs (03/20/2022)   PRAPARE - Administrator, Civil Service (Medical): No    Lack of Transportation (Non-Medical): No  Physical Activity: Inactive (03/20/2022)   Exercise Vital Sign    Days of Exercise per Week: 0 days    Minutes of Exercise per Session: 0 min  Stress: No Stress Concern Present (03/20/2022)   [employer] of Occupational Health - Occupational Stress Questionnaire    Feeling of Stress : Not at all  Social Connections: Moderately Integrated (03/20/2022)   Social Connection and Isolation Panel [NHANES]    Frequency of Communication with Friends and Family: More than three times a week    Frequency of Social Gatherings with Friends and Family: More than three times a week    Attends Religious Services: More than 4 times per year    Active Member of [employer] or Organizations: No    Attends Banker Meetings: Never    Marital Status: Married   Family  History  Problem Relation Age of Onset   Hypertension Father    Heart disease Father        Mi age 63, 66, 69 and 15.   Cancer Father    Hyperlipidemia Father    Colon cancer Maternal Uncle    Hypertension Mother    Cancer Mother    Hyperlipidemia Mother    Pulmonary fibrosis Sister    Epilepsy Son    Healthy Son    Myasthenia gravis Son    Healthy Son    Rheum arthritis Niece    Lupus Niece    Rheum arthritis Niece    Allergies  Allergen Reactions   Morphine And Codeine Nausea And Vomiting   Prochlorperazine     REACTION: nerve reaction   Simvastatin     REACTION: leg cramps   Sulfa Antibiotics    Current Outpatient Medications  Medication Sig Dispense Refill   amoxicillin-clavulanate (AUGMENTIN) 875-125 MG tablet Take 1 tablet by mouth 2 (two) times daily. 20 tablet 0   aspirin 81 MG EC tablet Take 81 mg by mouth at bedtime.     cephALEXin (KEFLEX) 500 MG capsule Take 1 capsule (500 mg total) by mouth 4 (four) times daily. 28 capsule 0   cyanocobalamin (VITAMIN B12) 1000 MCG/ML injection 1000 mcg (1 mg) injection once per week for four weeks, followed by 1000 mcg injection once per month. 1 mL 15   Evolocumab (REPATHA SURECLICK) 140 MG/ML SOAJ Inject 140 mg into the skin every 14 (fourteen) days. 6 mL 3   levothyroxine (SYNTHROID) 100 MCG tablet Take 1 tablet by mouth once daily 90 tablet 0   levothyroxine (SYNTHROID) 100 MCG tablet Take 1 tablet (100 mcg total) by mouth daily. 90 tablet 0   ondansetron (ZOFRAN-ODT) 4 MG disintegrating tablet Take 1 tablet (4 mg total) by mouth every 8 (eight) hours as needed for nausea or vomiting. 12 tablet 0   pantoprazole (PROTONIX) 40 MG tablet Take 1 tablet by mouth once daily 90 tablet 0   pantoprazole (PROTONIX) 40 MG tablet Take 1 tablet (40 mg total) by mouth daily. 90 tablet 0   PARoxetine (PAXIL) 20 MG tablet TAKE 1 TABLET BY MOUTH ONCE DAILY IN THE MORNING 90 tablet 0   PARoxetine (PAXIL) 20 MG tablet Take 1 tablet (20 mg  total) by mouth every morning. 90 tablet 0   predniSONE (DELTASONE) 20 MG tablet Take 2 tablets (40 mg total) by  mouth daily with breakfast. 10 tablet 0   predniSONE (DELTASONE) 5 MG tablet Take 1 tablet by mouth once daily with breakfast 90 tablet 0   promethazine (PHENERGAN) 50 MG suppository Place 1 suppository (50 mg total) rectally every 6 (six) hours as needed for nausea or vomiting. 12 each 0   traZODone (DESYREL) 50 MG tablet Take 0.5-1 tablets (25-50 mg total) by mouth at bedtime as needed for sleep. 30 tablet 3   No current facility-administered medications for this visit.   No results found.  Review of Systems:   A ROS was performed including pertinent positives and negatives as documented in the HPI.  Physical Exam :   Constitutional: NAD and appears stated age Neurological: Alert and oriented Psych: Appropriate affect and cooperative There were no vitals taken for this visit.   Comprehensive Musculoskeletal Exam:    Right ankle has moderate soft tissue edema without ecchymosis or erythema.  There is significant tenderness over the ATFL and calcaneofibular ligaments.  No tenderness over lateral malleolus or fifth metatarsal base.  Negative anterior drawer.  Active ankle range of motion to 30 degrees plantarflexion and 20 degrees dorsiflexion.  DP and PT pulses 2+.  Distal neurosensory exam intact.  Imaging:   Xray (right ankle 3 views, right foot 3 views): Negative for fracture or dislocation  I personally reviewed and interpreted the radiographs.   Assessment:   72 y.o. female with lateral right ankle pain after sustaining an inversion injury 2 days ago.  She does have a history of osteoporosis however x-rays do appear negative for fracture.  I do suspect that she sustained an ankle sprain particularly with her point tenderness over lateral ankle ligaments.  At this point I would recommend use of an ankle brace for added stability.  Continue RICE therapy as well as  anti-inflammatories.  I will plan to see her back on an as-needed basis, particularly if pain, inflammation, or instability persist.  All other questions addressed.  Plan :    -Return to clinic as needed     I personally saw and evaluated the patient, and participated in the management and treatment plan.  Hazle Nordmann, PA-C Orthopedics  This document was dictated using Conservation officer, historic buildings. A reasonable attempt at proof reading has been made to minimize errors.

## 2022-07-17 ENCOUNTER — Ambulatory Visit (HOSPITAL_BASED_OUTPATIENT_CLINIC_OR_DEPARTMENT_OTHER): Payer: PPO | Admitting: Student

## 2022-08-03 ENCOUNTER — Encounter: Payer: Self-pay | Admitting: Family Medicine

## 2022-08-10 DIAGNOSIS — Z1231 Encounter for screening mammogram for malignant neoplasm of breast: Secondary | ICD-10-CM | POA: Diagnosis not present

## 2022-08-10 LAB — HM MAMMOGRAPHY

## 2022-08-22 ENCOUNTER — Other Ambulatory Visit: Payer: Self-pay | Admitting: Family Medicine

## 2022-08-22 DIAGNOSIS — E538 Deficiency of other specified B group vitamins: Secondary | ICD-10-CM

## 2022-08-22 DIAGNOSIS — E034 Atrophy of thyroid (acquired): Secondary | ICD-10-CM

## 2022-08-23 MED ORDER — LEVOTHYROXINE SODIUM 100 MCG PO TABS
100.0000 ug | ORAL_TABLET | Freq: Every day | ORAL | 0 refills | Status: DC
Start: 2022-08-23 — End: 2023-03-10

## 2022-08-23 MED ORDER — PANTOPRAZOLE SODIUM 40 MG PO TBEC: 40.0000 mg | DELAYED_RELEASE_TABLET | Freq: Every day | ORAL | 0 refills | Status: DC

## 2022-08-23 MED ORDER — PAROXETINE HCL 20 MG PO TABS: 20.0000 mg | ORAL_TABLET | Freq: Every morning | ORAL | 0 refills | Status: DC

## 2022-09-27 ENCOUNTER — Encounter (INDEPENDENT_AMBULATORY_CARE_PROVIDER_SITE_OTHER): Payer: Self-pay

## 2022-10-02 ENCOUNTER — Other Ambulatory Visit: Payer: Self-pay | Admitting: Physician Assistant

## 2022-10-02 NOTE — Telephone Encounter (Signed)
Last Fill: 06/12/2022  Next Visit: 01/15/2023  Last Visit: 07/10/2022  Dx: Polymyalgia rheumatica   Current Dose per office note on 07/10/2022: Prednisone 5 mg daily.   Okay to refill Prednisone?

## 2022-10-03 ENCOUNTER — Other Ambulatory Visit: Payer: Self-pay | Admitting: Oncology

## 2022-10-03 DIAGNOSIS — Z006 Encounter for examination for normal comparison and control in clinical research program: Secondary | ICD-10-CM

## 2022-10-27 ENCOUNTER — Encounter: Payer: Self-pay | Admitting: Family Medicine

## 2022-10-29 ENCOUNTER — Ambulatory Visit: Payer: PPO | Admitting: Family Medicine

## 2022-11-13 ENCOUNTER — Encounter: Payer: Self-pay | Admitting: Gastroenterology

## 2022-11-21 ENCOUNTER — Telehealth: Payer: Self-pay | Admitting: Gastroenterology

## 2022-11-21 NOTE — Telephone Encounter (Signed)
Patient called requesting to speak with a nurse stated she is having trouble swallowing and breathing at times. Please advise.

## 2022-11-22 NOTE — Telephone Encounter (Signed)
Left message for patient to call back  

## 2022-11-22 NOTE — Telephone Encounter (Signed)
I have spoken to patient who states that she has had progressive difficulty swallowing over the last 6-7 months. Her worst "episode" was yesterday at which time she tried to swallow chicken and could not get it to go down for some time. Patient does c/o odynophagia as well. She denies having vomited. Patient does state that she is taking pantoprazole 40 mg every morning as prescribed. She does have a hx of schatzki ring.  Patient has been scheduled to see Boone Master, PA-C on 11/28/22 at 930 am for further evaluation/recommendations.  I did advise the patient that she should eat small, frequent meals, choose foods that are soft and easier to swallow, chew foods well and drink sips of fluids between bites. I have also advised that should she become unable to swallow her secretions or if food becomes lodged in the esophagus, she will need to seek immediate care at the ER for more urgent evaluation. Patient verbalizes understanding of this information.

## 2022-11-23 ENCOUNTER — Ambulatory Visit: Payer: PPO | Admitting: Podiatry

## 2022-11-28 ENCOUNTER — Encounter: Payer: Self-pay | Admitting: Gastroenterology

## 2022-11-28 ENCOUNTER — Ambulatory Visit: Payer: PPO | Admitting: Gastroenterology

## 2022-11-28 VITALS — BP 120/80 | HR 78 | Ht 61.0 in | Wt 158.0 lb

## 2022-11-28 DIAGNOSIS — Z860100 Personal history of colon polyps, unspecified: Secondary | ICD-10-CM | POA: Diagnosis not present

## 2022-11-28 DIAGNOSIS — R1310 Dysphagia, unspecified: Secondary | ICD-10-CM | POA: Diagnosis not present

## 2022-11-28 DIAGNOSIS — Z8719 Personal history of other diseases of the digestive system: Secondary | ICD-10-CM | POA: Diagnosis not present

## 2022-11-28 DIAGNOSIS — K219 Gastro-esophageal reflux disease without esophagitis: Secondary | ICD-10-CM

## 2022-11-28 NOTE — Progress Notes (Signed)
Chief Complaint: Dysphagia Primary GI MD: Dr. Adela Lank  HPI: 72 year old female history of strictures with multiple dilations presents for evaluation of dysphagia.  Patient states she has been struggling with dysphagia since 2021.  After her EGD in 2020 she had resolution of symptoms.  She states this is typical for her as she gets regular dilations.  She states in 2021 she is scheduled to get an EGD with dilation but broke her shoulder and had to undergo a shoulder replacement and since she was hospitalized at that time she had to cancel her appointment.  During her hospitalization she had delirium s/p anesthesia so she was nervous to undergo anesthesia again for repeat endoscopy.  She states her dysphagia has gotten progressively worse over the last few years and becoming unbearable over the last few months.  She states this is the worst it has ever been.  She has dysphagia to solids and sometimes liquids.  Feels like it gets stuck in her suprasternal notch.  She notes last week she had a piece of chicken and since then she has only been able to tolerate liquids.  She notes coughing/gagging with eating.  She has lost 7 pounds since changing to a liquid diet.  Notes some mildly increased GERD.  She is on Protonix 40 Mg once daily and is compliant with the medication.  She did do a Cologuard with her PCP this year which was negative.  Although her last colonoscopy did show history of polyps.  No change in bowel movements.  Denies melena/hematochezia.   PREVIOUS GI WORKUP   EGD January 2020 for dysphagia and history of stricture - Esophagogastric landmarks identified.  - 3 cm hiatal hernia.  - Benign- appearing esophageal stenosis. Dilated to 18mm with good result  - Gastritis. Biopsied. - Duodenal diverticulum.  EGD in March 2017 for dysphasia and was found to have a Schatzki's ring at the GE junction which was dilated from 18 to 19 mm.  Exam was otherwise unremarkable.  She had  colonoscopy in 2016 per Dr. Juanda Chance with removal of one 7 mm polyp from the sigmoid colon which was a tubular adenoma.  Past Medical History:  Diagnosis Date   Anxiety    Arthritis    CAD (coronary artery disease)    Cataract    Depression    DISORDER, MENOPAUSAL NOS 03/20/2007   Qualifier: Diagnosis of  By: Lovell Sheehan MD, Balinda Quails    GERD (gastroesophageal reflux disease)    Hyperlipidemia    Hypothyroidism    MYOCARDIAL INFARCTION, HX OF 09/09/2007   Qualifier: Diagnosis of  By: Cato Mulligan MD, Bruce     Osteoporosis    Polymyalgia rheumatica [facility])     Past Surgical History:  Procedure Laterality Date   CATARACT EXTRACTION     CESAREAN SECTION     CHOLECYSTECTOMY N/A 11/24/2012   Procedure: LAPAROSCOPIC CHOLECYSTECTOMY;  Surgeon: Axel Filler, MD;  Location: [facility] OR;  Service: General;  Laterality: N/A;   COSMETIC SURGERY     EYE SURGERY     eye lids lifted   PTCA     REVERSE SHOULDER ARTHROPLASTY Right 08/12/2020   Procedure: REVERSE SHOULDER ARTHROPLASTY;  Surgeon: Yolonda Kida, MD;  Location: [facility] OR;  Service: Orthopedics;  Laterality: Right;    Current Outpatient Medications  Medication Sig Dispense Refill   amoxicillin-clavulanate (AUGMENTIN) 875-125 MG tablet Take 1 tablet by mouth 2 (two) times daily. 20 tablet 0   aspirin 81 MG EC tablet Take 81 mg by mouth at  bedtime.     cephALEXin (KEFLEX) 500 MG capsule Take 1 capsule (500 mg total) by mouth 4 (four) times daily. 28 capsule 0   cyanocobalamin (VITAMIN B12) 1000 MCG/ML injection INJECT 1000 MCG (1 MG) ONCE A WEEK FOR 4 WEEKS,  FOLLOWED  BY  1000  MCG  INJECTION  ONCE  PER  MONTH. 1 mL 0   Evolocumab (REPATHA SURECLICK) 140 MG/ML SOAJ Inject 140 mg into the skin every 14 (fourteen) days. 6 mL 3   levothyroxine (SYNTHROID) 100 MCG tablet Take 1 tablet (100 mcg total) by mouth daily. 90 tablet 0   levothyroxine (SYNTHROID) 100 MCG tablet Take 1 tablet (100 mcg total) by mouth daily. 90 tablet 0   ondansetron (ZOFRAN-ODT)  4 MG disintegrating tablet Take 1 tablet (4 mg total) by mouth every 8 (eight) hours as needed for nausea or vomiting. 12 tablet 0   pantoprazole (PROTONIX) 40 MG tablet Take 1 tablet (40 mg total) by mouth daily. 90 tablet 0   pantoprazole (PROTONIX) 40 MG tablet Take 1 tablet (40 mg total) by mouth daily. 90 tablet 0   PARoxetine (PAXIL) 20 MG tablet Take 1 tablet (20 mg total) by mouth every morning. 90 tablet 0   PARoxetine (PAXIL) 20 MG tablet Take 1 tablet (20 mg total) by mouth every morning. 90 tablet 0   predniSONE (DELTASONE) 20 MG tablet Take 2 tablets (40 mg total) by mouth daily with breakfast. 10 tablet 0   predniSONE (DELTASONE) 5 MG tablet Take 1 tablet by mouth once daily with breakfast 90 tablet 0   promethazine (PHENERGAN) 50 MG suppository Place 1 suppository (50 mg total) rectally every 6 (six) hours as needed for nausea or vomiting. 12 each 0   traZODone (DESYREL) 50 MG tablet Take 0.5-1 tablets (25-50 mg total) by mouth at bedtime as needed for sleep. 30 tablet 3   No current facility-administered medications for this visit.    Allergies as of 11/28/2022 - Review Complete 07/16/2022  Allergen Reaction Noted   Morphine and codeine Nausea And Vomiting 07/06/2014   Prochlorperazine  03/08/2006   Simvastatin  01/10/2009   Sulfa antibiotics  11/24/2012    Family History  Problem Relation Age of Onset   Hypertension Father    Heart disease Father        Mi age 57, 31, 51 and 73.   Cancer Father    Hyperlipidemia Father    Colon cancer Maternal Uncle    Hypertension Mother    Cancer Mother    Hyperlipidemia Mother    Pulmonary fibrosis Sister    Epilepsy Son    Healthy Son    Myasthenia gravis Son    Healthy Son    Rheum arthritis Niece    Lupus Niece    Rheum arthritis Niece     Social History   Socioeconomic History   Marital status: Married    Spouse name: Not on file   Number of children: Not on file   Years of education: Not on file   Highest  education level: Not on file  Occupational History   Not on file  Tobacco Use   Smoking status: Never    Passive exposure: Never   Smokeless tobacco: Never  Vaping Use   Vaping status: Never Used  Substance and Sexual Activity   Alcohol use: No    Alcohol/week: 0.0 standard drinks of alcohol   Drug use: No   Sexual activity: Not on file  Other Topics Concern  Not on file  Social History Narrative   Husband and 2 adult children live at home. No grandkids. Son with epilepsy.      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     Social Determinants of Health   Financial Resource Strain: Low Risk  (03/20/2022)   Overall Financial Resource Strain (CARDIA)    Difficulty of Paying Living Expenses: Not hard at all  Food Insecurity: No Food Insecurity (03/20/2022)   Hunger Vital Sign    Worried About Running Out of Food in the Last Year: Never true    Ran Out of Food in the Last Year: Never true  Transportation Needs: No Transportation Needs (03/20/2022)   PRAPARE - Administrator, Civil Service (Medical): No    Lack of Transportation (Non-Medical): No  Physical Activity: Inactive (03/20/2022)   Exercise Vital Sign    Days of Exercise per Week: 0 days    Minutes of Exercise per Session: 0 min  Stress: No Stress Concern Present (03/20/2022)   [employer] of Occupational Health - Occupational Stress Questionnaire    Feeling of Stress : Not at all  Social Connections: Moderately Integrated (03/20/2022)   Social Connection and Isolation Panel [NHANES]    Frequency of Communication with Friends and Family: More than three times a week    Frequency of Social Gatherings with Friends and Family: More than three times a week    Attends Religious Services: More than 4 times per year    Active Member of [employer] or Organizations: No    Attends Banker Meetings: Never    Marital Status: Married  Catering manager Violence: Not At Risk (03/20/2022)   Humiliation, Afraid,  Rape, and Kick questionnaire    Fear of Current or Ex-Partner: No    Emotionally Abused: No    Physically Abused: No    Sexually Abused: No    Review of Systems:    Constitutional: No weight loss, fever, chills, weakness or fatigue HEENT: Eyes: No change in vision               Ears, Nose, Throat:  No change in hearing or congestion Skin: No rash or itching Cardiovascular: No chest pain, chest pressure or palpitations   Respiratory: No SOB or cough Gastrointestinal: See HPI and otherwise negative Genitourinary: No dysuria or change in urinary frequency Neurological: No headache, dizziness or syncope Musculoskeletal: No new muscle or joint pain Hematologic: No bleeding or bruising Psychiatric: No history of depression or anxiety    Physical Exam:  Vital signs: There were no vitals taken for this visit.  Constitutional: NAD, Well developed, Well nourished, alert and cooperative Head:  Normocephalic and atraumatic. Eyes:   PEERL, EOMI. No icterus. Conjunctiva pink. Respiratory: Respirations even and unlabored. Lungs clear to auscultation bilaterally.   No wheezes, crackles, or rhonchi.  Cardiovascular:  Regular rate and rhythm. No peripheral edema, cyanosis or pallor.  Gastrointestinal:  Soft, nondistended, nontender. No rebound or guarding. Normal bowel sounds. No appreciable masses or hepatomegaly. Rectal:  Not performed.  Msk:  Symmetrical without gross deformities. Without edema, no deformity or joint abnormality.  Neurologic:  Alert and  oriented x4;  grossly normal neurologically.  Skin:   Dry and intact without significant lesions or rashes. Psychiatric: Oriented to person, place and time. Demonstrates good judgement and reason without abnormal affect or behaviors.   RELEVANT LABS AND IMAGING: CBC    Component Value Date/Time   WBC 10.8 (H)  05/15/2022 0753   RBC 4.41 05/15/2022 0753   HGB 13.1 05/15/2022 0753   HCT 40.3 05/15/2022 0753   PLT 245 05/15/2022 0753    MCV 91.4 05/15/2022 0753   MCV 88.3 01/06/2016 1329   MCH 29.7 05/15/2022 0753   MCHC 32.5 05/15/2022 0753   RDW 13.5 05/15/2022 0753   LYMPHSABS 0.8 04/25/2022 0928   MONOABS 0.5 04/25/2022 0928   EOSABS 0.2 04/25/2022 0928   BASOSABS 0.1 04/25/2022 0928    CMP     Component Value Date/Time   NA 137 05/15/2022 0753   K 3.5 05/15/2022 0753   CL 107 05/15/2022 0753   CO2 19 (L) 05/15/2022 0753   GLUCOSE 122 (H) 05/15/2022 0753   BUN 19 05/15/2022 0753   CREATININE 1.04 (H) 05/15/2022 0753   CREATININE 0.82 04/11/2022 1509   CALCIUM 7.5 (L) 05/15/2022 0753   PROT 5.0 (L) 05/15/2022 0943   ALBUMIN 2.3 (L) 05/15/2022 0943   AST 27 05/15/2022 0943   ALT 20 05/15/2022 0943   ALKPHOS 79 05/15/2022 0943   BILITOT 1.0 05/15/2022 0943   GFRNONAA 57 (L) 05/15/2022 0753   GFRNONAA 65 04/22/2020 1347   GFRAA 75 04/22/2020 1347     Assessment/Plan:   Dysphagia, unspecified type Gastroesophageal reflux disease without esophagitis History of esophageal stricture -EGD with dilation - Post EGD may consider increasing Protonix to twice daily for 30 days with breakthrough symptoms of GERD on daily Protonix. - I thoroughly discussed the procedure with the patient (at bedside) to include nature of the procedure, alternatives, benefits, and risks (including but not limited to bleeding, infection, perforation, anesthesia/cardiac pulmonary complications).  Patient verbalized understanding and gave verbal consent to proceed with procedure.   History of colonic polyps Negative Cologuard.  Discussed Cologuard is not optimal in patients with history of colon polyps.  She is nervous about and does not colonoscopy this year.  She says she is more interested in getting it done early next year.  Discussed with her how Cologuard is more of an early detection rather than a prevention like a colonoscopy.  She understands. - Follow-up in January/February for colonoscopy  Danzel Marszalek [facility]  Gastroenterology 11/28/2022, 8:15 AM  Cc: Shelva Majestic, MD

## 2022-11-28 NOTE — Progress Notes (Signed)
Agree with assessment plan as outlined, we will proceed with EGD and dilation of stricture causing her symptoms.  Agree that colonoscopy is indicated for her as Cologuard is not appropriate for patients with a history of polyps.  She can schedule this at her convenience.

## 2022-11-28 NOTE — Patient Instructions (Addendum)
You have been scheduled for an endoscopy. Please follow written instructions given to you at your visit today.  If you use inhalers (even only as needed), please bring them with you on the day of your procedure.  If you take any of the following medications, they will need to be adjusted prior to your procedure:   DO NOT TAKE 7 DAYS PRIOR TO TEST- Trulicity (dulaglutide) Ozempic, Wegovy (semaglutide) Mounjaro (tirzepatide) Bydureon Bcise (exanatide extended release)  DO NOT TAKE 1 DAY PRIOR TO YOUR TEST Rybelsus (semaglutide) Adlyxin (lixisenatide) Victoza (liraglutide) Byetta (exanatide) ___________________________________________________________________________  Keep Follow-up Appointment  in January with Dr. Adela Lank.    Due to recent changes in healthcare laws, you may see the results of your imaging and laboratory studies on MyChart before your provider has had a chance to review them.  We understand that in some cases there may be results that are confusing or concerning to you. Not all laboratory results come back in the same time frame and the provider may be waiting for multiple results in order to interpret others.  Please give [place] 48 hours in order for your provider to thoroughly review all the results before contacting the office for clarification of your results.   _______________________________________________________  If your blood pressure at your visit was 140/90 or greater, please contact your primary care physician to follow up on this.  _______________________________________________________  If you are age 3 or older, your body mass index should be between 23-30. Your Body mass index is 29.85 kg/m. If this is out of the aforementioned range listed, please consider follow up with your Primary Care Provider.  If you are age 29 or younger, your body mass index should be between 19-25. Your Body mass index is 29.85 kg/m. If this is out of the aformentioned range  listed, please consider follow up with your Primary Care Provider.   ________________________________________________________  The [facility] providers would like to encourage you to use [facility] to communicate with providers for non-urgent requests or questions.  Due to long hold times on the telephone, sending your provider a message by [facility] may be a faster and more efficient way to get a response.  Please allow 48 business hours for a response.  Please remember that this is for non-urgent requests.  _______________________________________________________  Thank you for choosing me and [facility].  Bayley McMichael PA-C

## 2022-11-29 ENCOUNTER — Encounter: Payer: Self-pay | Admitting: Gastroenterology

## 2022-11-29 ENCOUNTER — Ambulatory Visit: Payer: PPO | Admitting: Gastroenterology

## 2022-11-29 VITALS — BP 153/83 | HR 80 | Temp 98.9°F | Resp 18 | Ht 61.0 in | Wt 158.0 lb

## 2022-11-29 DIAGNOSIS — R1310 Dysphagia, unspecified: Secondary | ICD-10-CM

## 2022-11-29 DIAGNOSIS — K2970 Gastritis, unspecified, without bleeding: Secondary | ICD-10-CM | POA: Diagnosis not present

## 2022-11-29 DIAGNOSIS — K222 Esophageal obstruction: Secondary | ICD-10-CM

## 2022-11-29 DIAGNOSIS — K449 Diaphragmatic hernia without obstruction or gangrene: Secondary | ICD-10-CM | POA: Diagnosis not present

## 2022-11-29 DIAGNOSIS — Z8719 Personal history of other diseases of the digestive system: Secondary | ICD-10-CM | POA: Diagnosis not present

## 2022-11-29 DIAGNOSIS — I2510 Atherosclerotic heart disease of native coronary artery without angina pectoris: Secondary | ICD-10-CM | POA: Diagnosis not present

## 2022-11-29 DIAGNOSIS — K219 Gastro-esophageal reflux disease without esophagitis: Secondary | ICD-10-CM | POA: Diagnosis not present

## 2022-11-29 DIAGNOSIS — K319 Disease of stomach and duodenum, unspecified: Secondary | ICD-10-CM | POA: Diagnosis not present

## 2022-11-29 MED ORDER — SODIUM CHLORIDE 0.9 % IV SOLN: 500.0000 mL | Freq: Once | INTRAVENOUS | Status: DC

## 2022-11-29 NOTE — Progress Notes (Signed)
Called to room to assist during endoscopic procedure.  Patient ID and intended procedure confirmed with present staff. Received instructions for my participation in the procedure from the performing physician.  

## 2022-11-29 NOTE — Op Note (Signed)
[facility] Patient Name: [name] Procedure Date: 11/29/2022 3:03 PM MRN: [id number] Endoscopist: Viviann Spare P. Adela Lank , MD, [id number] Age: 72 Referring MD:  Date of Birth: [birth year] Gender: Female Account #: [id number] Procedure:                Upper GI endoscopy Indications:              Dysphagia Medicines:                Monitored Anesthesia Care Procedure:                Pre-Anesthesia Assessment:                           - Prior to the procedure, a History and Physical                            was performed, and patient medications and                            allergies were reviewed. The patient's tolerance of                            previous anesthesia was also reviewed. The risks                            and benefits of the procedure and the sedation                            options and risks were discussed with the patient.                            All questions were answered, and informed consent                            was obtained. Prior Anticoagulants: The patient has                            taken no anticoagulant or antiplatelet agents. ASA                            Grade Assessment: II - A patient with mild systemic                            disease. After reviewing the risks and benefits,                            the patient was deemed in satisfactory condition to                            undergo the procedure.                           After obtaining informed consent, the endoscope was  passed under direct vision. Throughout the                            procedure, the patient's blood pressure, pulse, and                            oxygen saturations were monitored continuously. The                            GIF [id number] #[id number] was introduced through the                            mouth, and advanced to the second part of duodenum.                            The upper GI endoscopy was  accomplished without                            difficulty. The patient tolerated the procedure                            well. Scope In: Scope Out: Findings:                 Esophagogastric landmarks were identified: the                            Z-line was found at 33 cm, the gastroesophageal                            junction was found at 33 cm and the upper extent of                            the gastric folds was found at 37 cm from the                            incisors.                           A 4 cm hiatal hernia was present.                           One benign-appearing, intrinsic moderate stenosis                            was found 33 cm from the incisors. This stenosis                            measured less than one cm (in length). A TTS                            dilator was passed through the scope. Dilation with  a 15mm to 18 mm balloon dilator was performed to 15                            mm and 16 mm at which time appropriate dilation                            effect was noted.                           The exam of the esophagus was otherwise normal.                           Patchy mildly erythematous mucosa was found in the                            gastric antrum.                           The exam of the stomach was otherwise normal.                           Biopsies were taken with a cold forceps for                            Helicobacter pylori testing.                           A diverticulum was found in the second portion of                            the duodenum.                           The exam of the duodenum was otherwise normal. Complications:            No immediate complications. Estimated blood loss:                            Minimal. Estimated Blood Loss:     Estimated blood loss was minimal. Impression:               - Esophagogastric landmarks identified.                           - 4 cm hiatal hernia.                            - Benign-appearing esophageal stenosis. Dilated to                            16mm with good result.                           - Erythematous mucosa in the antrum.                           -  Normal stomach otherwise - biopsies taken to rule                            out H pylori.                           - Duodenal diverticulum.                           - Normal duodenum otherwise. Recommendation:           - Patient has a contact number available for                            emergencies. The signs and symptoms of potential                            delayed complications were discussed with the                            patient. Return to normal activities tomorrow.                            Written discharge instructions were provided to the                            patient.                           - Post dilation diet                           - Continue present medications including protonix.                           - Await pathology results. Viviann Spare P. [name], MD 11/29/2022 3:28:07 PM This report has been signed electronically.

## 2022-11-29 NOTE — Progress Notes (Signed)
Sedate, gd SR, tolerated procedure well, VSS, report to RN 

## 2022-11-29 NOTE — Patient Instructions (Addendum)
YOU HAD AN ENDOSCOPIC PROCEDURE TODAY AT THE [facility]:   Refer to the procedure report that was given to you for any specific questions about what was found during the examination.  If the procedure report does not answer your questions, please call your gastroenterologist to clarify.  If you requested that your care partner not be given the details of your procedure findings, then the procedure report has been included in a sealed envelope for you to review at your convenience later.  YOU SHOULD EXPECT: Some feelings of bloating in the abdomen. Passage of more gas than usual.  Walking can help get rid of the air that was put into your GI tract during the procedure and reduce the bloating. If you had a lower endoscopy (such as a colonoscopy or flexible sigmoidoscopy) you may notice spotting of blood in your stool or on the toilet paper. If you underwent a bowel prep for your procedure, you may not have a normal bowel movement for a few days.  Please Note:  You might notice some irritation and congestion in your nose or some drainage.  This is from the oxygen used during your procedure.  There is no need for concern and it should clear up in a day or so.  SYMPTOMS TO REPORT IMMEDIATELY:   Following upper endoscopy (EGD)  Vomiting of blood or coffee ground material  New chest pain or pain under the shoulder blades  Painful or persistently difficult swallowing  New shortness of breath  Fever of 100F or higher  Black, tarry-looking stools  For urgent or emergent issues, a gastroenterologist can be reached at any hour by calling [phone]. Do not use MyChart messaging for urgent concerns.    DIET:  Follow a POST-DILATION DIET (See handout given to you by your recovery nurse): NOTHING TO EAT OR DRINK until  4:30pm, Then you may have CLEAR LIQUIDS ONLY from 4:30pm to 5:30pm. You may then have a SOFT DIET for the remainder of today. You may then proceed to your regular diet  tomorrow morning as tolerated.  Drink plenty of fluids but you should avoid alcoholic beverages for 24 hours.  MEDICATIONS: Continue present medications including PROTONIX.  FOLLOW UP: Await pathology results.  Please see handouts given to you by your recovery nurse: Esophageal stricture, Hiatal Hernia, Post-dilation diet.  Thank you for allowing [place] to provide for your healthcare needs today.  ACTIVITY:  You should plan to take it easy for the rest of today and you should NOT DRIVE or use heavy machinery until tomorrow (because of the sedation medicines used during the test).    FOLLOW UP: Our staff will call the number listed on your records the next business day following your procedure.  We will call around 7:15- 8:00 am to check on you and address any questions or concerns that you may have regarding the information given to you following your procedure. If we do not reach you, we will leave a message.     If any biopsies were taken you will be contacted by phone or by letter within the next 1-3 weeks.  Please call us at [phone] if you have not heard about the biopsies in 3 weeks.    SIGNATURES/CONFIDENTIALITY: You and/or your care partner have signed paperwork which will be entered into your electronic medical record.  These signatures attest to the fact that that the information above on your After Visit Summary has been reviewed and is understood.  Full responsibility  of the confidentiality of this discharge information lies with you and/or your care-partner.

## 2022-11-29 NOTE — Progress Notes (Signed)
Pt's states no medical or surgical changes since previsit or office visit. 

## 2022-11-29 NOTE — Progress Notes (Signed)
History and Physical Interval Note: Seen in the office yesterday for dysphagia - history of reflux with peptic stricture / hiatal hernia that has responded to dilation in the past. Now with recurrent symptoms. Here for EGD with dilation of her esophagus for suspected stricture. Have discussed risks / benefits she wishes to proceed.   11/29/2022 3:03 PM  [name]  has presented today for endoscopic procedure(s), with the diagnosis of  Encounter Diagnoses  Name Primary?   Dysphagia, unspecified type Yes   Gastroesophageal reflux disease without esophagitis    History of esophageal stricture   .  The various methods of evaluation and treatment have been discussed with the patient and/or family. After consideration of risks, benefits and other options for treatment, the patient has consented to  the endoscopic procedure(s).   The patient's history has been reviewed, patient examined, no change in status, stable for surgery.  I have reviewed the patient's chart and labs.  Questions were answered to the patient's satisfaction.    Harlin Rain, MD [facility]

## 2022-11-30 ENCOUNTER — Telehealth: Payer: Self-pay

## 2022-11-30 NOTE — Telephone Encounter (Signed)
Follow up call placed, VM obtained and message left. 

## 2022-12-04 ENCOUNTER — Ambulatory Visit: Payer: PPO | Admitting: Orthopaedic Surgery

## 2022-12-04 LAB — SURGICAL PATHOLOGY

## 2022-12-04 MED ORDER — SUCRALFATE 1 GM/10ML PO SUSP: 1.0000 g | Freq: Four times a day (QID) | ORAL | 0 refills | Status: DC | PRN

## 2022-12-04 NOTE — Telephone Encounter (Signed)
Patient with recent esophageal dilation 11/29/22.  Calls with complaints of continued sore throat following procedure. States she has had dilation several times previously with no issue; this time her sore throat has persisted (although she admits it is very slightly better yesterday and today). States she has had increased cough since procedure and "gaggy" feeling. +Belching. Denies any regurgitation, nausea or vomiting; no hematemesis; able to tolerate PO. No fever. Patient currently on pantoprazole 40 mg once daily.  Please advise.

## 2022-12-04 NOTE — Telephone Encounter (Signed)
Left voicemail for patient to call back. 

## 2022-12-04 NOTE — Telephone Encounter (Signed)
Spoke to patient to advise she should use throat lozenges, increase pantoprazole to 40 mg twice daily x 1 week and we will send carafate liquid for her to use every 6 hours as needed to coat the esophagus. Advised it continued esophageal pain despite these measures she should contact us. Also advised if any chest pain, shortness of breath, etc, she should seek more urgent evaluation. Patient verbalizes understanding. Rx sent to pharmacy.

## 2022-12-04 NOTE — Telephone Encounter (Signed)
Sorry to hear this. Sometime sore throat can happen after EGD. She can try some throat lozenges to see if that helps. Otherwise can increase her protonix to 40mg  BID for one week and can try some liquid carafate 10cc po every 6 hours to see if that helps as well. Suspect this will improve over the next few days. If chest pain, shortness of breath, etc, she should call us back. Thanks

## 2022-12-04 NOTE — Telephone Encounter (Signed)
Inbound call from patient, states she feels like there is a blockage in her throat and it is still very sore from her procedure. Would like to discuss with a nurse. Please advise.

## 2022-12-04 NOTE — Addendum Note (Signed)
Addended by: Richardson Chiquito on: 12/04/2022 04:44 PM   Modules accepted: Orders

## 2022-12-17 ENCOUNTER — Other Ambulatory Visit: Payer: Self-pay | Admitting: Rheumatology

## 2022-12-18 NOTE — Telephone Encounter (Signed)
Last Fill: 10/02/2022   Next Visit: 01/15/2023   Last Visit: 07/10/2022   Dx: Polymyalgia rheumatica    Current Dose per office note on 07/10/2022: Prednisone 5 mg daily.    Okay to refill Prednisone?

## 2022-12-30 ENCOUNTER — Other Ambulatory Visit: Payer: Self-pay | Admitting: Family Medicine

## 2023-01-02 NOTE — Progress Notes (Signed)
Office Visit Note  Patient: [name]             Date of Birth: [birth year]           MRN: [id number]             PCP: Shelva Majestic, MD Referring: Shelva Majestic, MD Visit Date: 01/15/2023 Occupation: @GUAROCC @  Subjective:  Medication management  History of Present Illness: [name] is a 72 y.o. female with osteoporosis and osteoarthritis.  Patient states that she had no side effects from Reclast infusion.  Patient states she continues to have lower back pain.  She has been followed by Dr. Ophelia Charter.  She complains of discomfort in her both hands, right shoulder and right hip.  She has limited range of motion of her right shoulder which limits her with getting dressed and doing usual activities.    Activities of Daily Living:  Patient reports morning stiffness for 1 hour.   Patient Reports nocturnal pain.  Difficulty dressing/grooming: Denies Difficulty climbing stairs: Reports Difficulty getting out of chair: Denies Difficulty using hands for taps, buttons, cutlery, and/or writing: Denies  Review of Systems  Constitutional:  Negative for fatigue.  HENT:  Negative for mouth sores and mouth dryness.   Eyes:  Negative for dryness.  Respiratory:  Negative for shortness of breath.   Cardiovascular:  Negative for chest pain and palpitations.  Gastrointestinal:  Negative for blood in stool, constipation and diarrhea.  Endocrine: Negative for increased urination.  Genitourinary:  Negative for involuntary urination.  Musculoskeletal:  Positive for joint pain, joint pain, muscle weakness and morning stiffness. Negative for gait problem, joint swelling, myalgias, muscle tenderness and myalgias.  Skin:  Negative for color change, rash, hair loss and sensitivity to sunlight.  Allergic/Immunologic: Negative for susceptible to infections.  Neurological:  Negative for dizziness and headaches.  Hematological:  Negative for swollen glands.  Psychiatric/Behavioral:  Positive  for sleep disturbance. Negative for depressed mood. The patient is not nervous/anxious.     PMFS History:  Patient Active Problem List   Diagnosis Date Noted   B12 deficiency 04/26/2022   Hyperglycemia 04/25/2022   Unilateral primary osteoarthritis, left hip 08/24/2021   Shoulder fracture 08/10/2020   Lumbar compression fracture (HCC) 05/11/2019   Dyslipidemia 01/26/2019   Drug-induced myopathy 01/14/2019   Haglund's deformity 01/12/2019   High risk medication use 12/04/2018   Lower esophageal ring ([place]) 03/03/2018   Osteoarthritis of right hip 12/02/2017   GERD (gastroesophageal reflux disease) 12/15/2015   Polyp of colon, adenomatous 10/11/2014   Vitamin D deficiency 05/26/2014   Osteoporosis 05/26/2014   Polymyalgia rheumatica (HCC) 12/01/2010   CAD (coronary artery disease) 09/09/2007   Hematuria 09/09/2007   Depression 03/20/2007   Hypothyroid 02/13/1999    Past Medical History:  Diagnosis Date   Anxiety    Arthritis    CAD (coronary artery disease)    Cataract    Depression    DISORDER, MENOPAUSAL NOS 03/20/2007   Qualifier: Diagnosis of  By: Lovell Sheehan MD, Balinda Quails    GERD (gastroesophageal reflux disease)    Hyperlipidemia    Hypothyroidism    MYOCARDIAL INFARCTION, HX OF 09/09/2007   Qualifier: Diagnosis of  By: Cato Mulligan MD, Bruce     Osteoporosis    Polymyalgia rheumatica (HCC)     Family History  Problem Relation Age of Onset   Hypertension Father    Heart disease Father        Mi age 28, 56, 79 and  31.   Cancer Father    Hyperlipidemia Father    Colon cancer Maternal Uncle    Hypertension Mother    Cancer Mother    Hyperlipidemia Mother    Pulmonary fibrosis Sister    Epilepsy Son    Healthy Son    Myasthenia gravis Son    Healthy Son    Rheum arthritis Niece    Lupus Niece    Rheum arthritis Niece    Past Surgical History:  Procedure Laterality Date   CATARACT EXTRACTION     CESAREAN SECTION     CHOLECYSTECTOMY N/A 11/24/2012   Procedure:  LAPAROSCOPIC CHOLECYSTECTOMY;  Surgeon: Axel Filler, MD;  Location: [facility] OR;  Service: General;  Laterality: N/A;   COSMETIC SURGERY     EYE SURGERY     eye lids lifted   PTCA     REVERSE SHOULDER ARTHROPLASTY Right 08/12/2020   Procedure: REVERSE SHOULDER ARTHROPLASTY;  Surgeon: Yolonda Kida, MD;  Location: [facility] OR;  Service: Orthopedics;  Laterality: Right;   Social History   Social History Narrative   Husband and 2 adult children live at home. No grandkids. Son with epilepsy.      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     Immunization History  Administered Date(s) Administered   Fluad Quad(high Dose 65+) 11/10/2019, 11/21/2020, 11/28/2021   Influenza Whole 11/09/2008   Influenza, High Dose Seasonal PF 10/30/2016, 12/02/2017, 11/08/2018, 10/26/2022   Influenza-Unspecified 11/08/2018   Moderna Covid-19 Fall Seasonal Vaccine 34yrs & older 10/26/2022   PFIZER Comirnaty(Gray Top)Covid-19 Tri-Sucrose Vaccine 11/16/2021   PFIZER(Purple Top)SARS-COV-2 Vaccination 02/26/2019, 03/20/2019, 11/28/2019   PNEUMOCOCCAL CONJUGATE-20 06/21/2020   Pfizer Covid-19 Vaccine Bivalent Booster 45yrs & up 11/21/2020, 07/22/2021   Pneumococcal Conjugate-13 10/11/2014   Pneumococcal Polysaccharide-23 12/15/2015   Rsv, Bivalent, Protein Subunit Rsvpref,pf Verdis Frederickson) 11/16/2021   Td 02/12/2006   Td (Adult), 2 Lf Tetanus Toxid, Preservative Free 02/12/2006   Tdap 04/26/2017   Zoster Recombinant(Shingrix) 07/18/2021, 09/19/2021   Zoster, Live 09/19/2010     Objective: Vital Signs: BP (!) 157/77 (BP Location: Left Arm, Patient Position: Sitting, Cuff Size: Normal)   Pulse 73   Resp 15   Ht 5' 1.5" (1.562 m)   Wt 160 lb (72.6 kg)   BMI 29.74 kg/m    Physical Exam Vitals and nursing note reviewed.  Constitutional:      Appearance: She is well-developed.  HENT:     Head: Normocephalic and atraumatic.  Eyes:     Conjunctiva/sclera: Conjunctivae normal.  Cardiovascular:     Rate and  Rhythm: Normal rate and regular rhythm.     Heart sounds: Normal heart sounds.  Pulmonary:     Effort: Pulmonary effort is normal.     Breath sounds: Normal breath sounds.  Abdominal:     General: Bowel sounds are normal.     Palpations: Abdomen is soft.  Musculoskeletal:     Cervical back: Normal range of motion.  Lymphadenopathy:     Cervical: No cervical adenopathy.  Skin:    General: Skin is warm and dry.     Capillary Refill: Capillary refill takes less than 2 seconds.  Neurological:     Mental Status: She is alert and oriented to person, place, and time.  Psychiatric:        Behavior: Behavior normal.      Musculoskeletal Exam: She had limited lateral rotation of the cervical spine.  Thoracic kyphosis was noted without any point tenderness.  She  had limited painful range of motion of lumbar spine without any point tenderness.  Right shoulder joint abduction, forward flexion was limited to about 90 degrees.  She had limited internal rotation.  Left shoulder joint was in good range of motion.  Elbows, wrist joints, MCPs PIPs and DIPs were in good range of motion with no synovitis.  Hip joints and knee joints were in good range of motion without any warmth swelling or effusion.  There was no tenderness over ankles or MTPs.  CDAI Exam: CDAI Score: -- Patient Global: --; Provider Global: -- Swollen: --; Tender: -- Joint Exam 01/15/2023   No joint exam has been documented for this visit   There is currently no information documented on the homunculus. Go to the Rheumatology activity and complete the homunculus joint exam.  Investigation: No additional findings.  Imaging: No results found.  Recent Labs: Lab Results  Component Value Date   WBC 10.8 (H) 05/15/2022   HGB 13.1 05/15/2022   PLT 245 05/15/2022   NA 137 05/15/2022   K 3.5 05/15/2022   CL 107 05/15/2022   CO2 19 (L) 05/15/2022   GLUCOSE 122 (H) 05/15/2022   BUN 19 05/15/2022   CREATININE 1.04 (H) 05/15/2022    BILITOT 1.0 05/15/2022   ALKPHOS 79 05/15/2022   AST 27 05/15/2022   ALT 20 05/15/2022   PROT 5.0 (L) 05/15/2022   ALBUMIN 2.3 (L) 05/15/2022   CALCIUM 7.5 (L) 05/15/2022   GFRAA 75 04/22/2020   QFTBGOLDPLUS NEGATIVE 08/12/2018    Speciality Comments: Forteo October 2021-February 2022 restarted June 2022 (missed 3 weeks due to fracture)  Should be completing Forteo in December 2023  Procedures:  No procedures performed Allergies: Morphine and codeine, Prochlorperazine, Simvastatin, and Sulfa antibiotics   Assessment / Plan:     Visit Diagnoses: Age-related osteoporosis without current pathological fracture - DEXA 08/07/2021:Bilateral femoral neck BMD 0.619 with T-score -2.1. Forteo started October 2021 (gap February 22 till June 22).  Completed Forteo in January 2024.  Patient had IV Reclast infusion on May 07, 2022.  Patient had no side effects from Reclast.  She has been taking calcium and vitamin D.  She has been also walking on a regular basis.  Strength training was also emphasized.  She will have repeat DEXA scan in July 2025.  Medication monitoring encounter - IV reclast started on 05/07/22.  We plan to infuse her again in April 2025.  History of compression fracture of spine - Previous L1 compression fracture with 60% vertebral body height loss.  Vitamin D deficiency-she has been taking vitamin D.  Polymyalgia rheumatica (HCC) - DXd 2015 by another rheumatologist.  Long term (current) use of systemic steroids - Prednisone 5 mg daily.  Unable to taper.  I did detailed discussion with the patient regarding tapering prednisone.  She states she will try to taper after the next visit.  She does not want to taper during the winter months.  Side effects of prednisone use including the increased risk of cataracts, osteoporosis, heart disease, diabetes, hypertension, weight gain was discussed.  High risk medication use - Discontinued methotrexate in April 2022 as she did not notice  any benefit.  She is not taking any immunosuppressive agents at this time.  S/P shoulder replacement, right-she has limited forward flexion, abduction and internal rotation.  She has chronic pain.  Primary osteoarthritis of both hands-she continue to have some discomfort in her hands.  No swelling was noted.  Primary osteoarthritis of right hip-she reports  pain and discomfort in her right hip joint off-and-on.  Trochanteric bursitis, right hip-intermittent discomfort.  Spondylosis of lumbar spine - Dr. Kennith Gain continues to have chronic pain.  Core strengthening exercises were discussed.  Primary osteoarthritis of both feet-proper fitting shoes were discussed.  Other medical problems listed as follows:  History of gastroesophageal reflux (GERD)  Coronary artery disease involving native coronary artery of native heart without angina pectoris  Adenomatous polyp of colon, unspecified part of colon  History of depression  History of hypothyroidism  Orders: No orders of the defined types were placed in this encounter.  No orders of the defined types were placed in this encounter.   Follow-Up Instructions: Return in about 4 months (around 05/16/2023) for Osteoarthritis, Osteoporosis.   Pollyann Savoy, MD  Note - This record has been created using Animal nutritionist.  Chart creation errors have been sought, but may not always  have been located. Such creation errors do not reflect on  the standard of medical care.

## 2023-01-08 ENCOUNTER — Other Ambulatory Visit: Payer: Self-pay | Admitting: Rheumatology

## 2023-01-13 IMAGING — CT CT CERVICAL SPINE W/O CM
5 of 11 series · 9 of 33 positions shown, 10 images · non-contrast
Comparison: None.

CLINICAL DATA: Head trauma

EXAM:
CT HEAD WITHOUT CONTRAST
CT MAXILLOFACIAL WITHOUT CONTRAST
CT CERVICAL SPINE WITHOUT CONTRAST
TECHNIQUE: Multidetector CT imaging of the head, cervical spine, and
maxillofacial structures were performed using the standard protocol
without intravenous contrast. Multiplanar CT image reconstructions
of the cervical spine and maxillofacial structures were also
generated.

[Series 4: max soft · axial · 0.52mm/px · z∈[-124,-72]mm · 2 of 79 slices shown]
[im 27/79  soft-tissue]
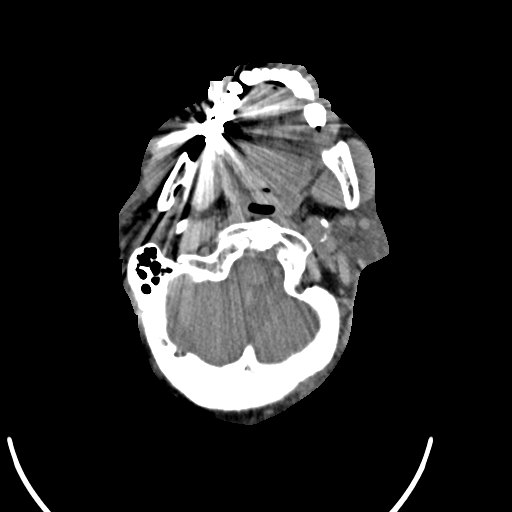
[im 53/79  soft-tissue]
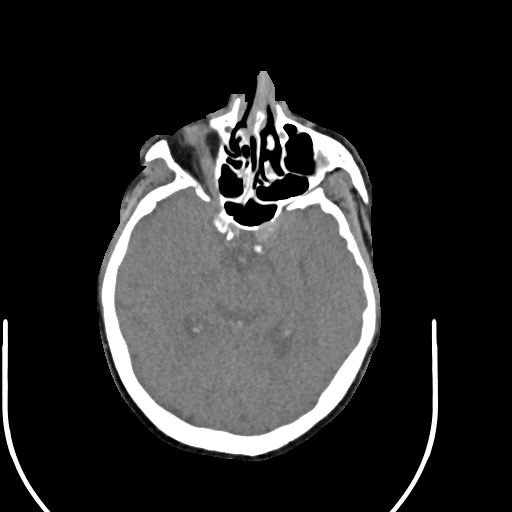

[Series 11: coronal soft · coronal · 0.36mm/px · 1 of 86 slices shown]
[im 43/86  bone]
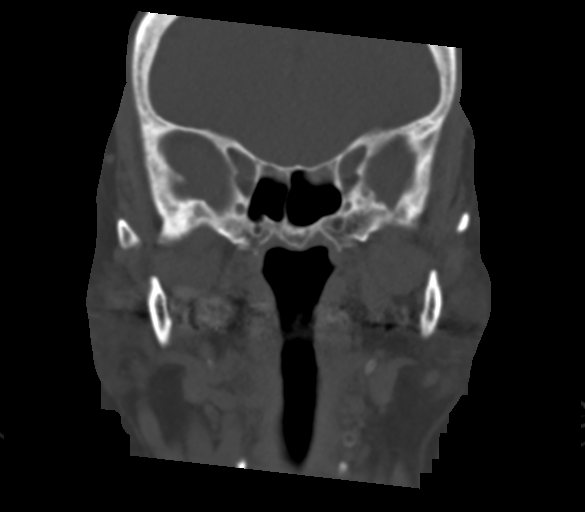

[Series 14: sagittal bone · sagittal · 0.36mm/px · 2 of 95 slices shown]
[im 32/95  bone]
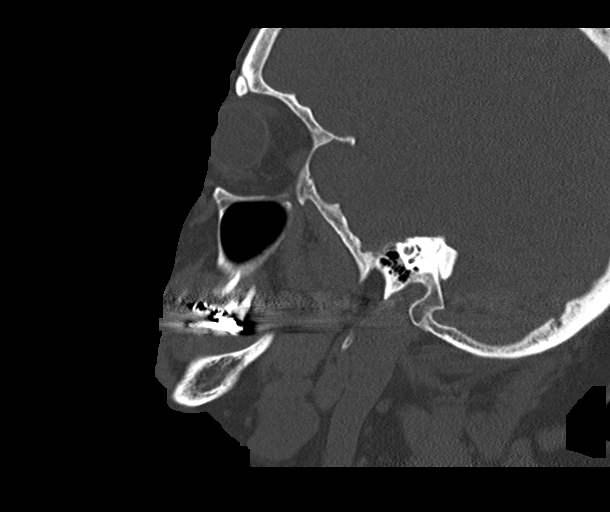
[im 63/95  bone]
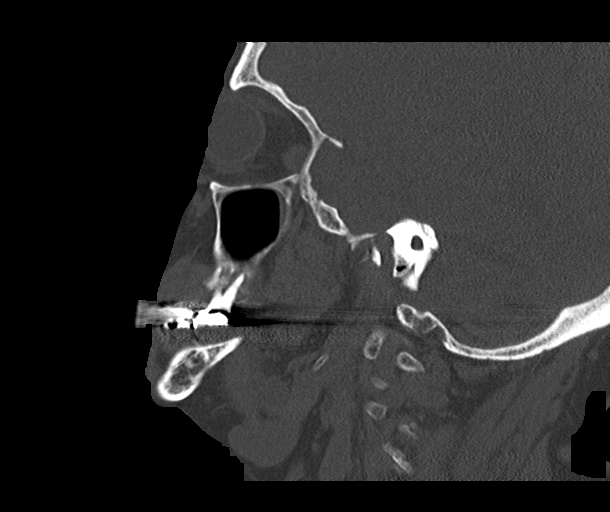

[Series 15: c spine soft · axial · 0.45mm/px · z∈[-202,-156]mm · 2 of 70 slices shown]
[im 24/70  soft-tissue]
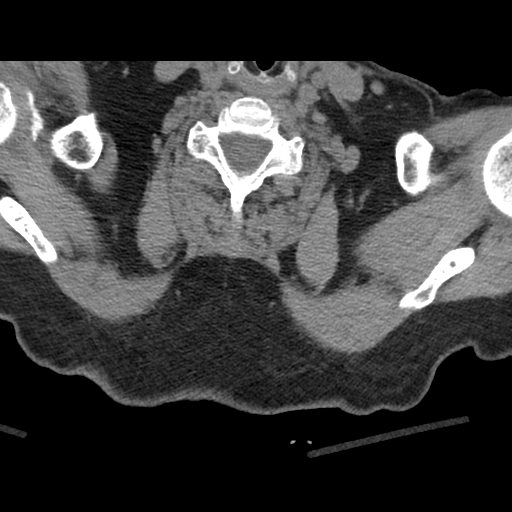
[im 47/70  soft-tissue]
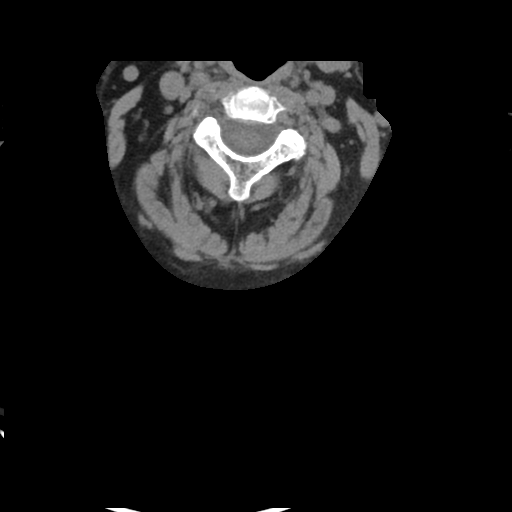

[Series 19: orthogonal axials · axial · 0.23mm/px · z∈[-220,-166]mm · 2 of 86 slices shown, 3 images]
[im 29/86  soft-tissue]
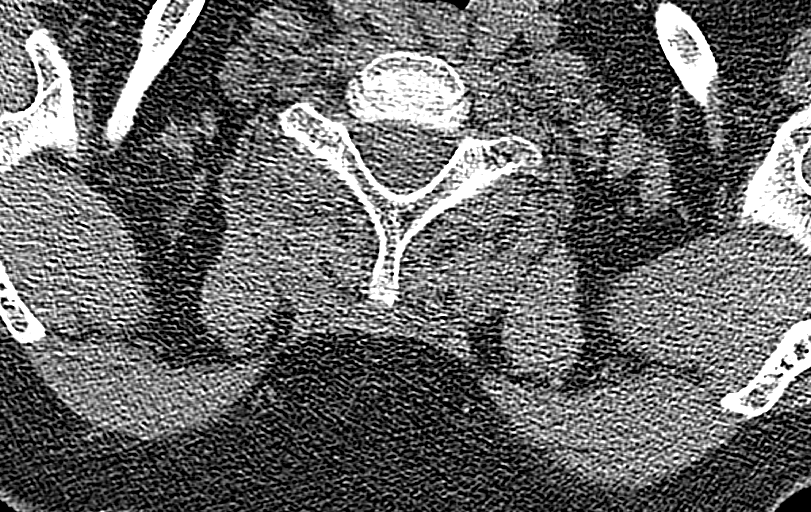
[im 29/86  bone]
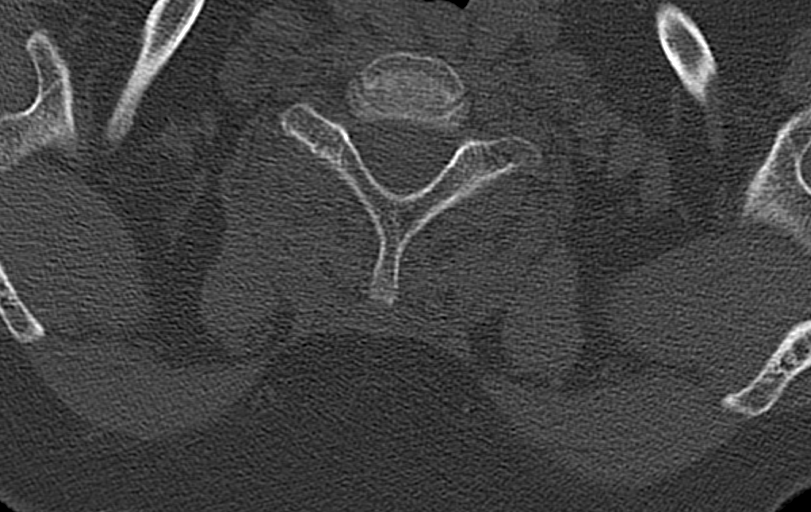
[im 57/86  bone]
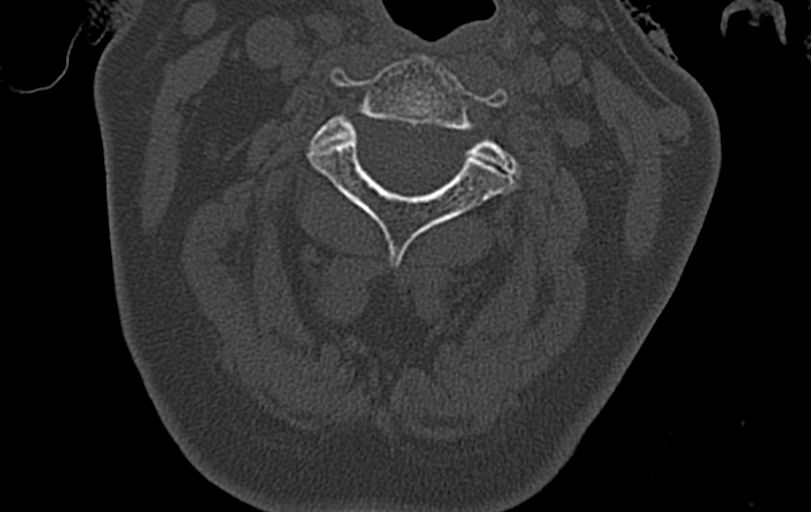

[9 of 33 positions shown; findings below may reference images not displayed]

FINDINGS: CT HEAD FINDINGS

Brain: No acute territorial infarction, hemorrhage, or intracranial
mass. Encephalomalacia within the inferior left cerebellar
hemisphere consistent with chronic infarct. Nonenlarged ventricles.
Mild atrophy

Vascular: No hyperdense vessels. Vertebral and carotid vascular
calcification.

Skull: No depressed skull fracture

Other: None

CT MAXILLOFACIAL FINDINGS

Osseous: Mandibular heads are normally position. No mandibular
fracture. Mastoid air cells are clear. Pterygoid plates and
zygomatic arches are intact. Acute bilateral mildly depressed nasal
bone fractures.

Orbits: Negative. No traumatic or inflammatory finding.

Sinuses: Fluid level left maxillary sinus without definitive sinus
wall fracture. Small fluid levels in the posterior ethmoid sinuses.

Soft tissues: Swelling over the nasal region and left forehead.

CT CERVICAL SPINE FINDINGS

Alignment: Within normal limits.  Facet alignment is normal

Skull base and vertebrae: No acute fracture. No primary bone lesion
or focal pathologic process.

Soft tissues and spinal canal: No prevertebral fluid or swelling. No
visible canal hematoma.

Disc levels:  Disc spaces are patent.

Upper chest: Negative.

Other: None
IMPRESSION: 1. No CT evidence for acute intracranial abnormality. Chronic left
cerebellar infarct and mild atrophy
2. No CT evidence for acute osseous abnormality of the cervical
spine
3. Acute bilateral mildly displaced nasal bone fracture. Fluid
levels in left maxillary and posterior ethmoid sinuses but without
definitive sinus fracture identified.

## 2023-01-13 IMAGING — CT CT CHEST W/ CM
2 of 3 series · 15 of 36 positions shown, 18 images · IV contrast (Omnipaque)
Comparison: None.

CLINICAL DATA: Chest trauma, hypoxia

EXAM:
CT CHEST WITH CONTRAST
TECHNIQUE: Multidetector CT imaging of the chest was performed during
intravenous contrast administration.
CONTRAST:  75mL OMNIPAQUE IOHEXOL 300 MG/ML  SOLN

[Series 2: axial st · axial · 0.67mm/px · z∈[+188,+442]mm · 12 of 151 slices shown, 15 images]
[im 12/151  mediastinal]
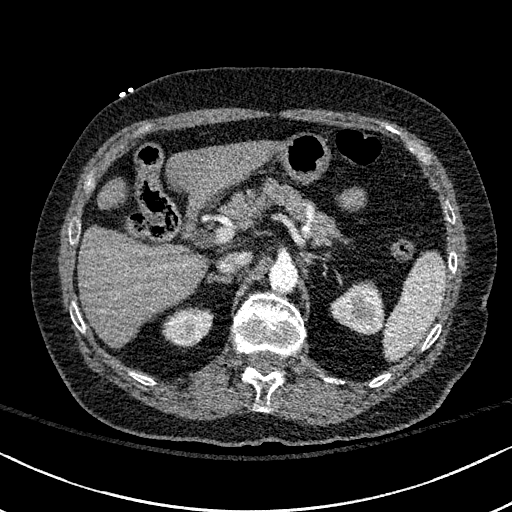
[im 12/151  lung]
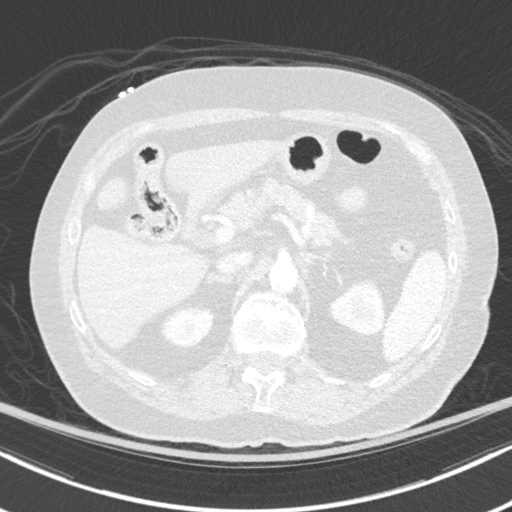
[im 23/151  lung]
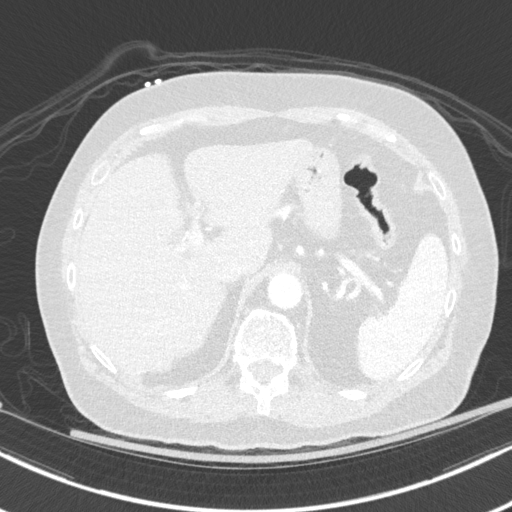
[im 34/151  lung]
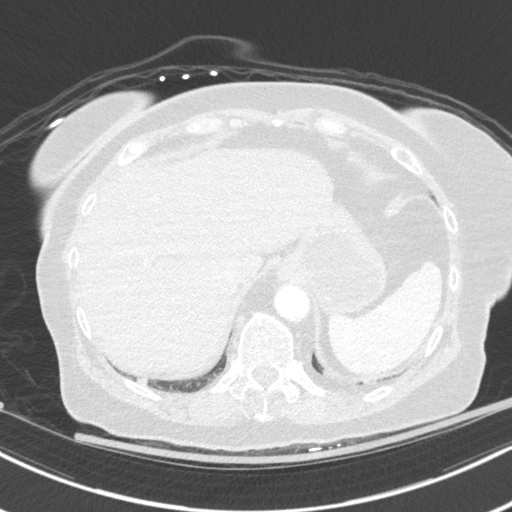
[im 45/151  lung]
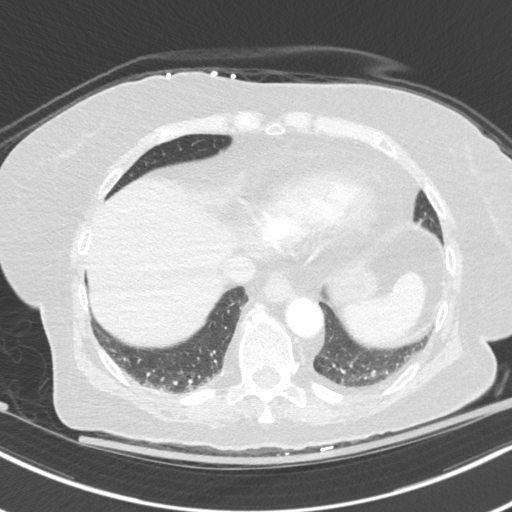
[im 56/151  mediastinal]
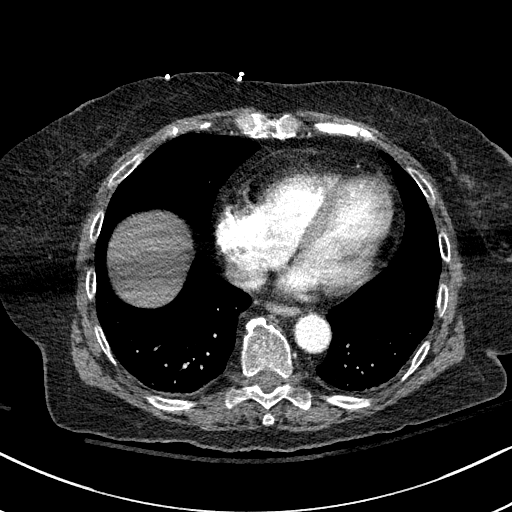
[im 56/151  lung]
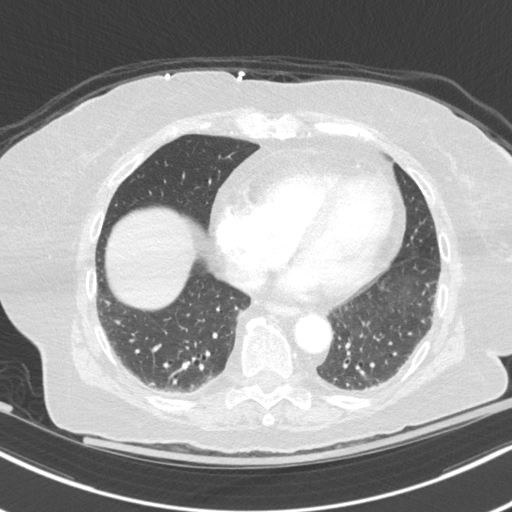
[im 67/151  lung]
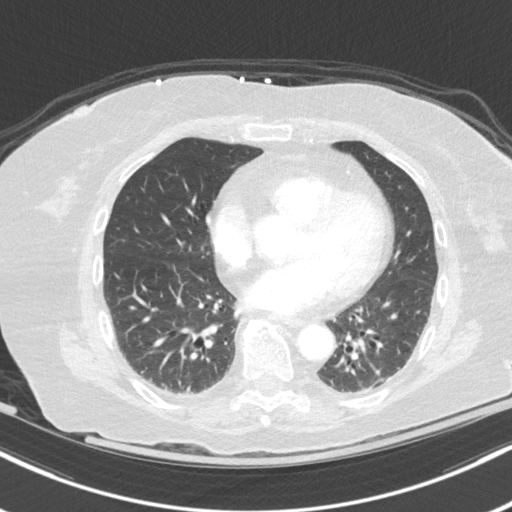
[im 84/151  lung]
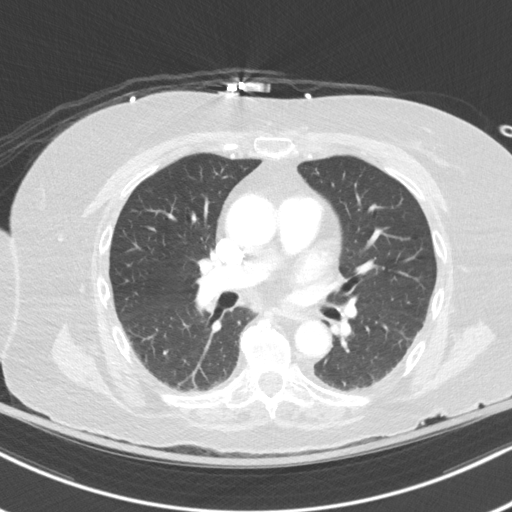
[im 95/151  lung]
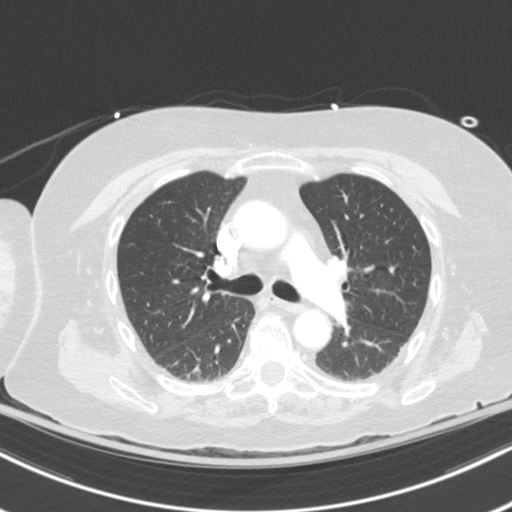
[im 106/151  mediastinal]
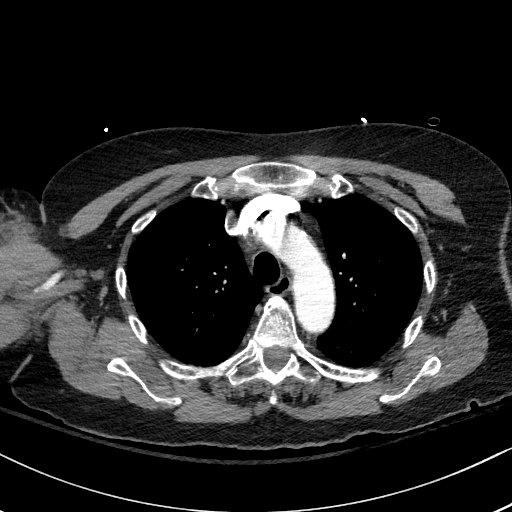
[im 106/151  lung]
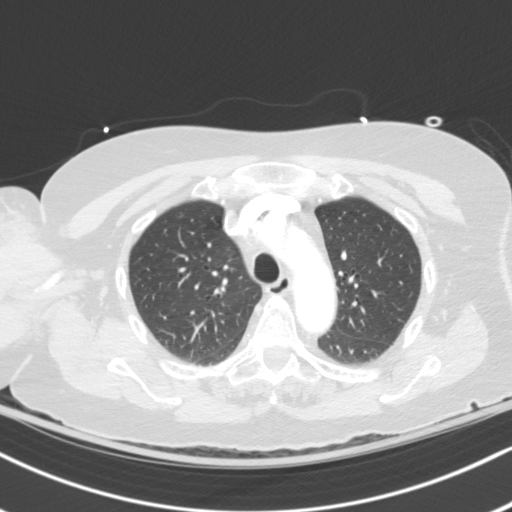
[im 117/151  lung]
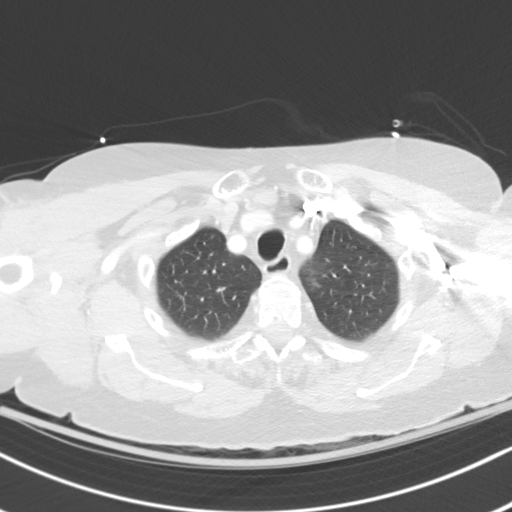
[im 128/151  lung]
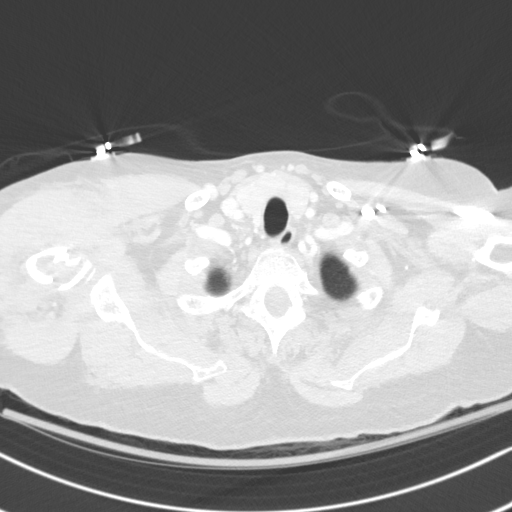
[im 139/151  lung]
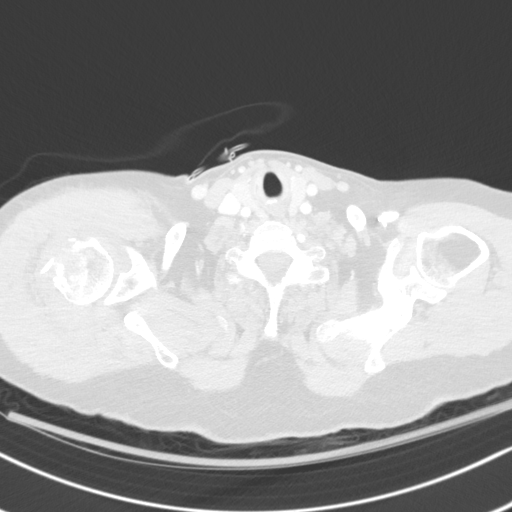

[Series 5: coronal · coronal · 0.61mm/px · 3 of 150 slices shown]
[im 30/150  lung]
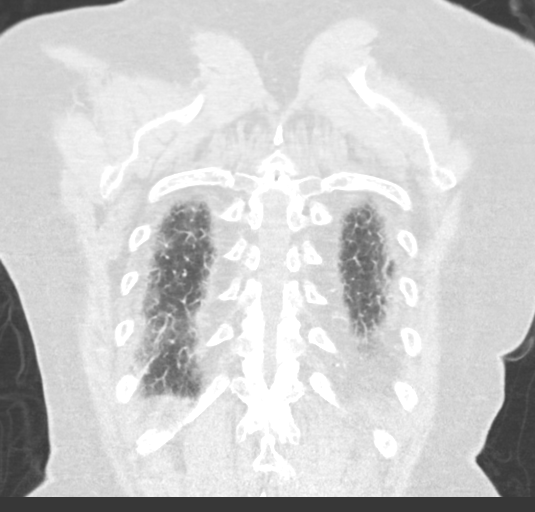
[im 60/150  lung]
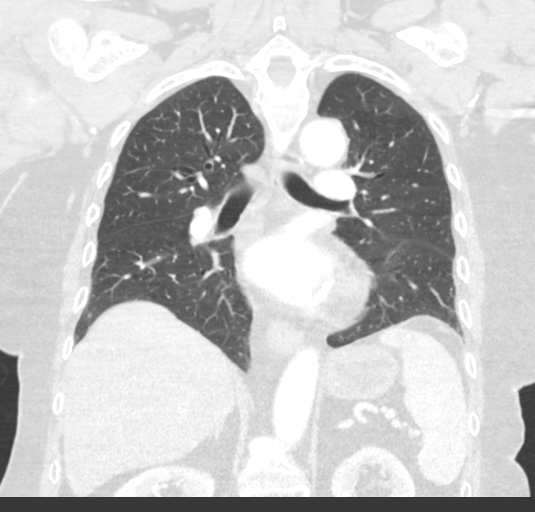
[im 90/150  lung]
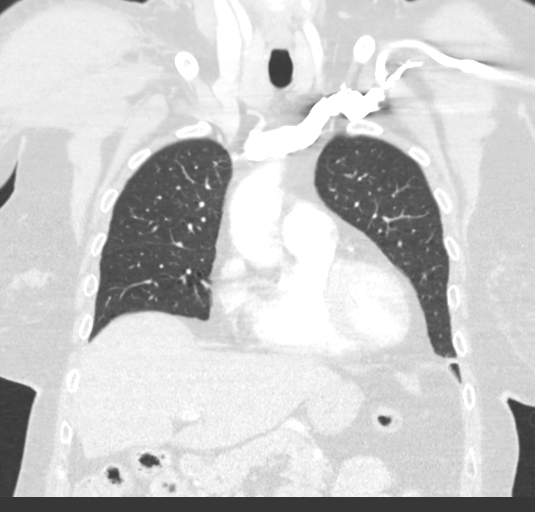

[15 of 36 positions shown; findings below may reference images not displayed]

FINDINGS: Cardiovascular: Heart is normal size. Aorta normal caliber.
Scattered aortic calcifications.

Mediastinum/Nodes: No mediastinal, hilar, or axillary adenopathy.
Trachea and esophagus are unremarkable. Thyroid unremarkable.

Lungs/Pleura: Lungs are clear. No focal airspace opacities or
suspicious nodules. No effusions. No pneumothorax

Upper Abdomen: Imaging into the upper abdomen demonstrates no acute
findings.

Musculoskeletal: Chest wall soft tissues are unremarkable.
Comminuted proximal right humeral fracture noted.
IMPRESSION: Comminuted proximal right humeral fracture.

No acute cardiopulmonary disease.

Aortic Atherosclerosis ([id number]).

## 2023-01-15 ENCOUNTER — Ambulatory Visit: Payer: PPO | Attending: Rheumatology | Admitting: Rheumatology

## 2023-01-15 ENCOUNTER — Encounter: Payer: Self-pay | Admitting: Rheumatology

## 2023-01-15 VITALS — BP 157/77 | HR 73 | Resp 15 | Ht 61.5 in | Wt 160.0 lb

## 2023-01-15 DIAGNOSIS — Z79899 Other long term (current) drug therapy: Secondary | ICD-10-CM | POA: Diagnosis not present

## 2023-01-15 DIAGNOSIS — M19041 Primary osteoarthritis, right hand: Secondary | ICD-10-CM | POA: Diagnosis not present

## 2023-01-15 DIAGNOSIS — M353 Polymyalgia rheumatica: Secondary | ICD-10-CM | POA: Diagnosis not present

## 2023-01-15 DIAGNOSIS — Z5181 Encounter for therapeutic drug level monitoring: Secondary | ICD-10-CM

## 2023-01-15 DIAGNOSIS — I2510 Atherosclerotic heart disease of native coronary artery without angina pectoris: Secondary | ICD-10-CM

## 2023-01-15 DIAGNOSIS — Z8781 Personal history of (healed) traumatic fracture: Secondary | ICD-10-CM | POA: Diagnosis not present

## 2023-01-15 DIAGNOSIS — M810 Age-related osteoporosis without current pathological fracture: Secondary | ICD-10-CM | POA: Diagnosis not present

## 2023-01-15 DIAGNOSIS — E559 Vitamin D deficiency, unspecified: Secondary | ICD-10-CM

## 2023-01-15 DIAGNOSIS — M47816 Spondylosis without myelopathy or radiculopathy, lumbar region: Secondary | ICD-10-CM

## 2023-01-15 DIAGNOSIS — Z7952 Long term (current) use of systemic steroids: Secondary | ICD-10-CM

## 2023-01-15 DIAGNOSIS — M1611 Unilateral primary osteoarthritis, right hip: Secondary | ICD-10-CM

## 2023-01-15 DIAGNOSIS — Z8719 Personal history of other diseases of the digestive system: Secondary | ICD-10-CM

## 2023-01-15 DIAGNOSIS — Z96611 Presence of right artificial shoulder joint: Secondary | ICD-10-CM | POA: Diagnosis not present

## 2023-01-15 DIAGNOSIS — Z8659 Personal history of other mental and behavioral disorders: Secondary | ICD-10-CM

## 2023-01-15 DIAGNOSIS — M7061 Trochanteric bursitis, right hip: Secondary | ICD-10-CM

## 2023-01-15 DIAGNOSIS — D126 Benign neoplasm of colon, unspecified: Secondary | ICD-10-CM

## 2023-01-15 DIAGNOSIS — M19042 Primary osteoarthritis, left hand: Secondary | ICD-10-CM

## 2023-01-15 DIAGNOSIS — Z8639 Personal history of other endocrine, nutritional and metabolic disease: Secondary | ICD-10-CM

## 2023-01-15 DIAGNOSIS — M19071 Primary osteoarthritis, right ankle and foot: Secondary | ICD-10-CM

## 2023-01-15 DIAGNOSIS — M19072 Primary osteoarthritis, left ankle and foot: Secondary | ICD-10-CM

## 2023-01-16 IMAGING — DX DG SHOULDER 2+V PORT*R*
1 series · 3 of 3 positions shown · non-contrast
Comparison: 08/09/2020

CLINICAL DATA: Status post reverse shoulder arthroplasty

EXAM:
PORTABLE RIGHT SHOULDER

[Series 1: shoulder · 0.14mm/px · 3 of 3 slices shown]
[im 1/3]
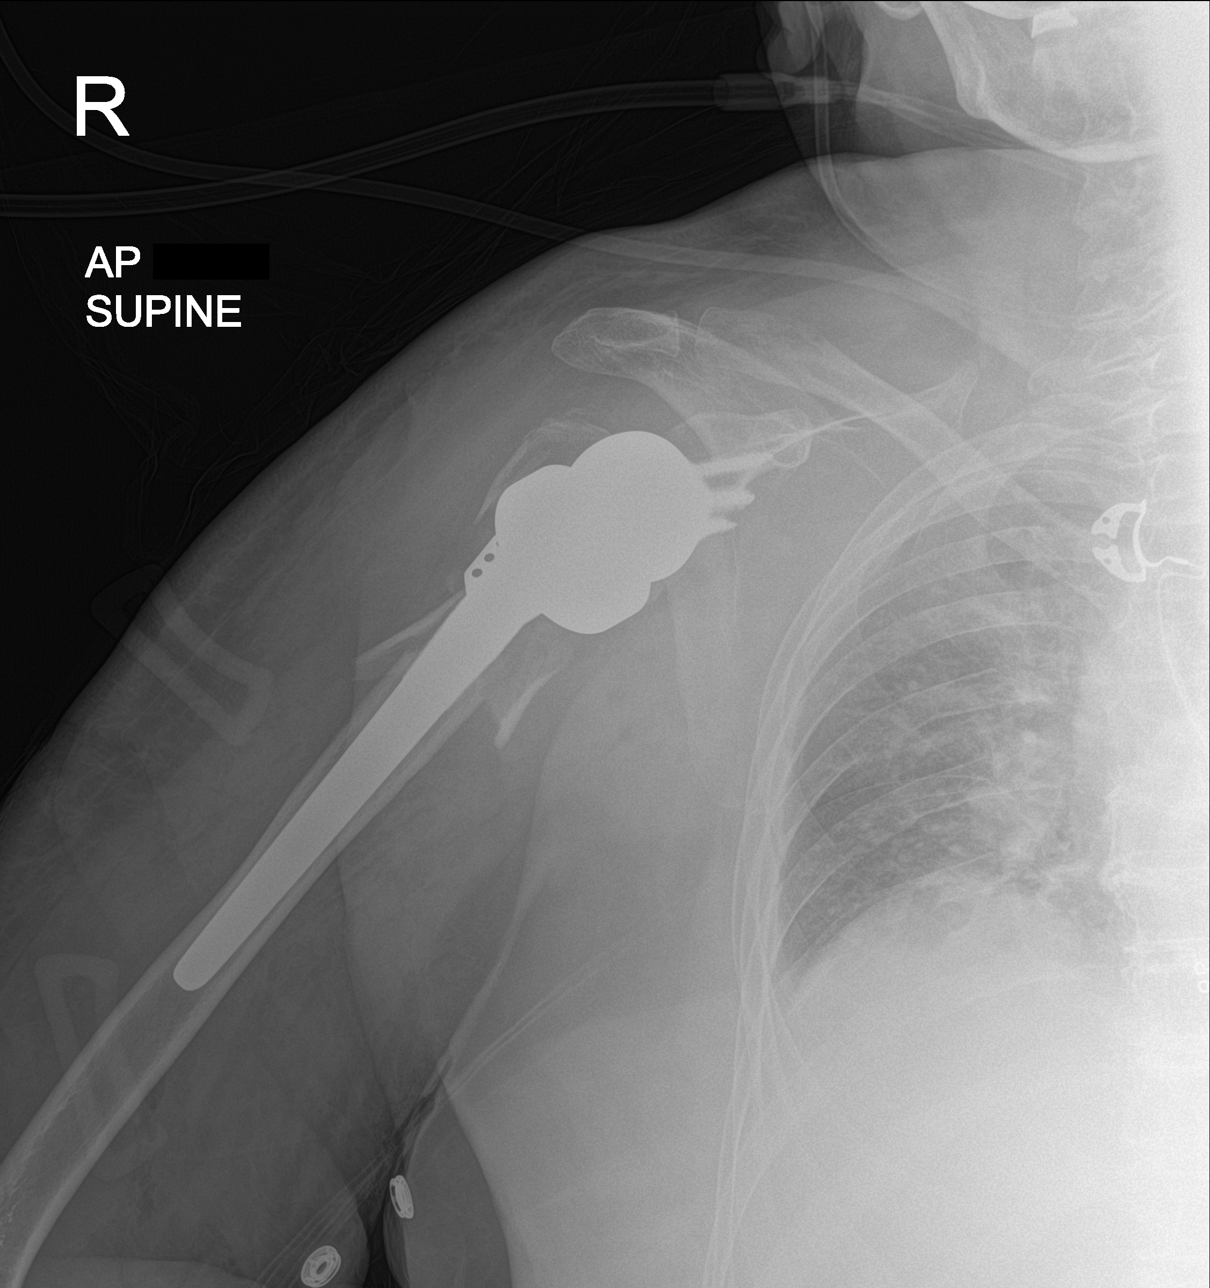
[im 2/3]
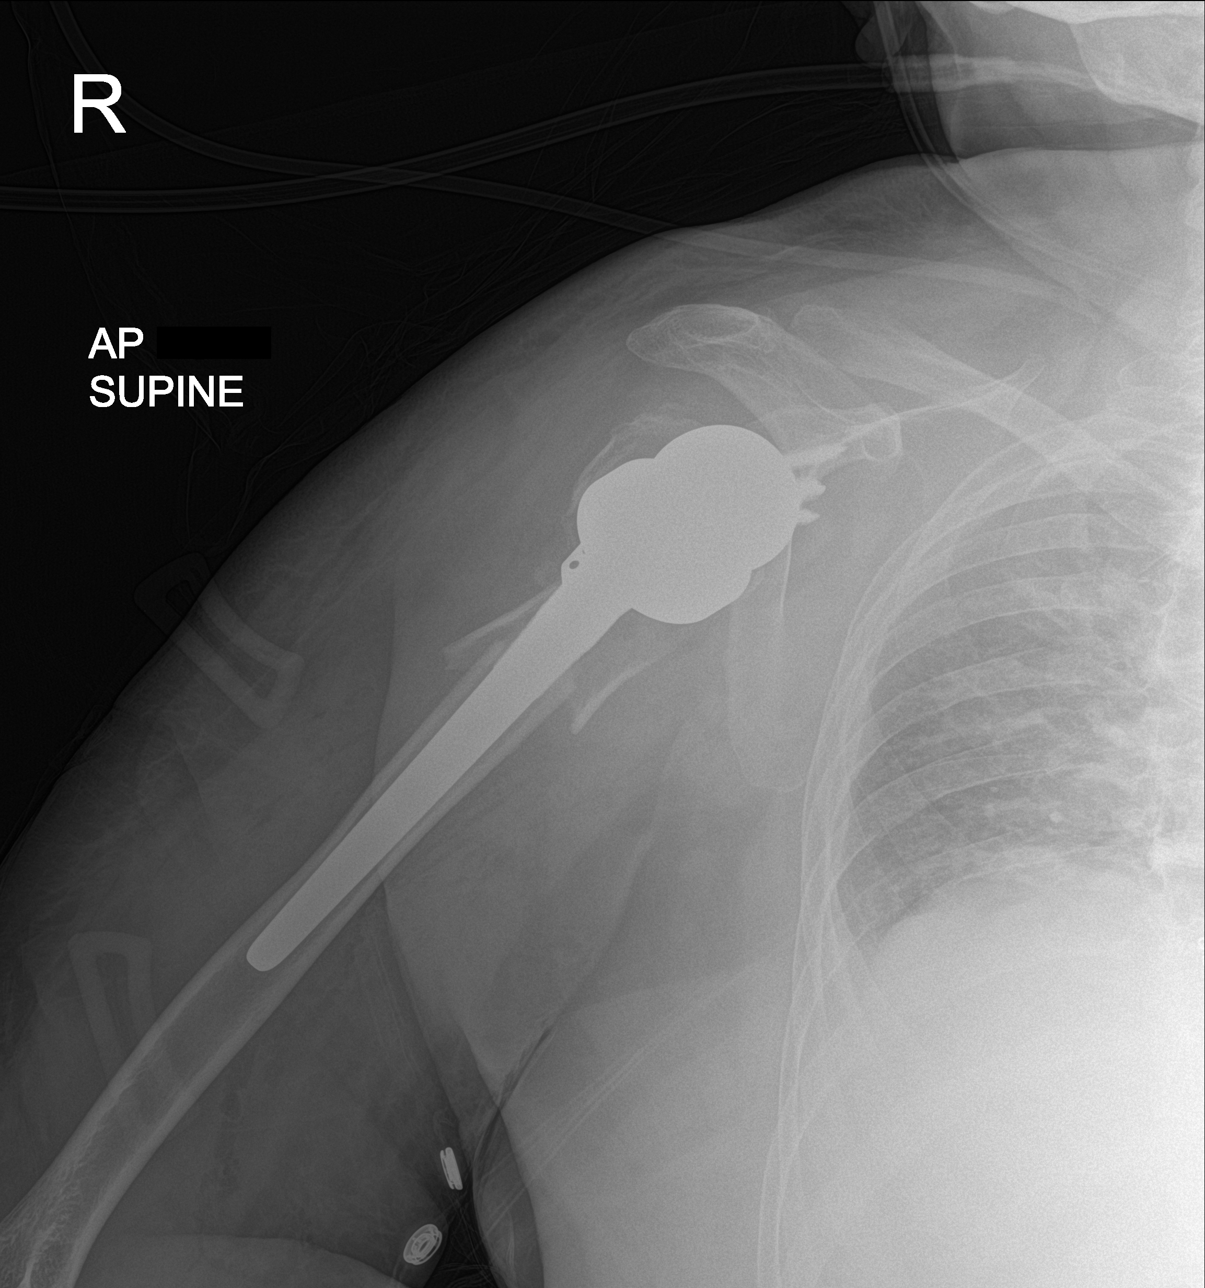
[im 3/3]
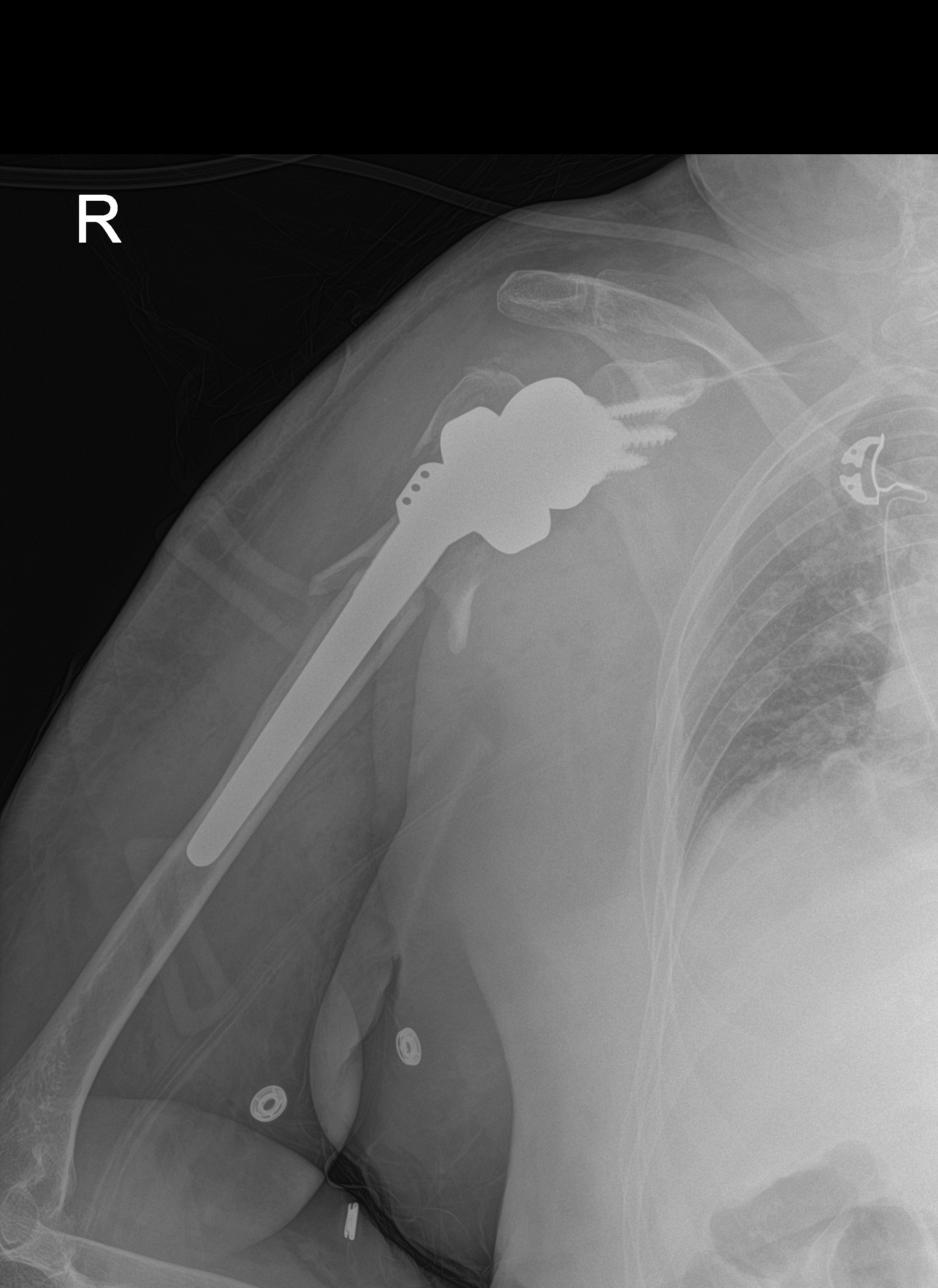

[3 of 3 positions shown; findings below may reference images not displayed]

FINDINGS: Changes consistent with shoulder arthroplasty are noted. Prior
remnants of comminuted proximal humeral fracture are seen. The
underlying bony thorax is within normal limits. No other focal
abnormality is noted.
IMPRESSION: Status post right shoulder replacement.

## 2023-01-26 ENCOUNTER — Other Ambulatory Visit: Payer: Self-pay | Admitting: Family Medicine

## 2023-02-03 ENCOUNTER — Encounter: Payer: Self-pay | Admitting: Gastroenterology

## 2023-02-22 ENCOUNTER — Ambulatory Visit: Payer: PPO | Admitting: Orthopaedic Surgery

## 2023-02-25 ENCOUNTER — Other Ambulatory Visit (INDEPENDENT_AMBULATORY_CARE_PROVIDER_SITE_OTHER): Payer: Medicare Other

## 2023-02-25 ENCOUNTER — Encounter: Payer: Self-pay | Admitting: Orthopaedic Surgery

## 2023-02-25 ENCOUNTER — Ambulatory Visit: Payer: Medicare Other | Admitting: Orthopaedic Surgery

## 2023-02-25 VITALS — BP 124/80 | HR 74 | Ht 61.5 in | Wt 160.0 lb

## 2023-02-25 DIAGNOSIS — M545 Low back pain, unspecified: Secondary | ICD-10-CM

## 2023-02-25 DIAGNOSIS — G8929 Other chronic pain: Secondary | ICD-10-CM

## 2023-02-25 NOTE — Progress Notes (Signed)
 Office Visit Note   Patient: [name]           Date of Birth: 03-04-1950           MRN: [id number] Visit Date: 02/25/2023              Requested by: Katrinka Garnette KIDD, MD [address] [place],  [place] [address] PCP: Katrinka Garnette KIDD, MD   Assessment & Plan: Visit Diagnoses:  1. Chronic bilateral low back pain without sciatica     Plan: [place] presents today and we discussed activities she should avoid which are exactly the activity she is doing which is twisting turning bending.  She needs to go back and get a grabber so she can uses to pick objects off the floor.  We discussed walking program trying to get her 2 miles in a day.  Follow-Up Instructions: No follow-ups on file.   Orders:  Orders Placed This Encounter  Procedures   XR Lumbar Spine 2-3 Views   No orders of the defined types were placed in this encounter.     Procedures: No procedures performed   Clinical Data: No additional findings.   Subjective: Chief Complaint  Patient presents with   Lower Back - Pain    HPI 73 year old female with osteoporosis with chronic back pain.  She does not have problems if she is not active but whenever she does mopping vacuuming turning twisting, getting laundry out of the front load washer dryer or bending over making the bed etc. she has increased pain.  She has used occasional Salonpas, ibuprofen and heating pad.  Pain does not radiate down her legs.  Pains in the upper lumbar region.  Patient has previous L1 fracture present on CT scan unchanged from 05/15/2022.  Inferior endplate L2 has a deformity which looks like a large Schmorl's node and may represent a new endplate fracture.  Next DEXA scan is due June 2025.  Patient is on calcium  vitamin D  and Forteo .  Review of Systems all the systems unchanged.   Objective: Vital Signs: BP 124/80   Pulse 74   Ht 5' 1.5 (1.562 m)   Wt 160 lb (72.6 kg)   BMI 29.74 kg/m   Physical Exam Constitutional:       Appearance: She is well-developed.  HENT:     Head: Normocephalic.     Right Ear: External ear normal.     Left Ear: External ear normal. There is no impacted cerumen.  Eyes:     Pupils: Pupils are equal, round, and reactive to light.  Neck:     Thyroid : No thyromegaly.     Trachea: No tracheal deviation.  Cardiovascular:     Rate and Rhythm: Normal rate.  Pulmonary:     Effort: Pulmonary effort is normal.  Abdominal:     Palpations: Abdomen is soft.  Musculoskeletal:     Cervical back: No rigidity.  Skin:    General: Skin is warm and dry.  Neurological:     Mental Status: She is alert and oriented to person, place, and time.  Psychiatric:        Behavior: Behavior normal.     Ortho Exam negative straight leg raising anterior tib gastrocsoleus is intact normal heel-toe gait.  Increased pain with bending twisting lateral bending lumbar spine.  Specialty Comments:  MRI LUMBAR SPINE WITHOUT CONTRAST   TECHNIQUE: Multiplanar, multisequence MR imaging of the lumbar spine was performed. No intravenous contrast was administered.   COMPARISON:  MRI of the lumbar spine June 12, 2014   FINDINGS: Segmentation:  Standard.   Alignment:  There is a levoscoliosis of the lumbar spine.   Vertebrae: Chronic fracture of the L1 vertebral body with loss of approximately 60% of the vertebral body height with mild retropulsion of the superior aspect of the posterior wall into the spinal canal without significant spinal canal stenosis.   Marrow edema is seen in the is superior articular process of L5 on the left side related to degenerative changes.   No acute fracture, evidence of discitis, or bone lesion.   Conus medullaris and cauda equina: Conus extends to the T12-L1 level. Conus and cauda equina appear normal.   Paraspinal and other soft tissues: Negative.   Disc levels:   T12-L1: Small retropulsion of the posterior wall into the spinal canal without significant spinal  canal stenosis. Perineural cysts noted on the right side. There is no neural foraminal stenosis.   L1-2: No spinal canal or neural foraminal stenosis.   L2-3: Shallow disc bulge resulting in mild bilateral neural foraminal narrowing. No spinal canal stenosis.   L3-4: Shallow disc bulge and mild facet degenerative changes with ligamentum flavum redundancy resulting in mild left neural foraminal narrowing. No spinal canal stenosis.   L4-5: Disc bulge with superimposed left central disc protrusion, facet degenerative changes with mild ligamentum flavum redundancy resulting in mild spinal canal stenosis with narrowing of the left subarticular zone, mild right and moderate left neural foraminal narrowing.   L5-S1: Facet degenerative changes, left greater than right. No spinal canal or neural foraminal stenosis.   IMPRESSION: 1. No acute fracture of the lumbar spine. 2. Chronic L1 compression fracture with loss of approximately 60% of vertebral body height with mild retropulsion of the posterior wall into the spinal canal without significant spinal canal stenosis. 3. Multilevel degenerative changes of the lumbar spine, worst at L4-L5 with mild spinal canal stenosis, narrowing of the left subarticular zone, mild right and moderate left neural foraminal narrowing.     Electronically Signed   By: Katyucia  De Macedo Rodrigues M.D.   On: 04/24/2019 11:13  Imaging: XR Lumbar Spine 2-3 Views Result Date: 02/25/2023 AP lateral lumbar images are obtained and reviewed.  Old L1 compression fracture stable from April 2024 unchanged.  Inferior endplate L2 with central deformity not previously noted on 05/15/2022 abdominal CT scan may represent new trauma versus Schmorl's node.  No loss of L2 height.  Again noted lumbar curvature unchanged. Impression: Old L1 compression fracture.  New inferior endplate L2 central compression.    PMFS History: Patient Active Problem List   Diagnosis Date Noted    B12 deficiency 04/26/2022   Hyperglycemia 04/25/2022   Unilateral primary osteoarthritis, left hip 08/24/2021   Shoulder fracture 08/10/2020   Lumbar compression fracture (HCC) 05/11/2019   Dyslipidemia 01/26/2019   Drug-induced myopathy 01/14/2019   Haglund's deformity 01/12/2019   High risk medication use 12/04/2018   Lower esophageal ring (Schatzki) 03/03/2018   Osteoarthritis of right hip 12/02/2017   GERD (gastroesophageal reflux disease) 12/15/2015   Polyp of colon, adenomatous 10/11/2014   Vitamin D  deficiency 05/26/2014   Osteoporosis 05/26/2014   Polymyalgia rheumatica (HCC) 12/01/2010   CAD (coronary artery disease) 09/09/2007   Hematuria 09/09/2007   Depression 03/20/2007   Hypothyroid 02/13/1999   Past Medical History:  Diagnosis Date   Anxiety    Arthritis    CAD (coronary artery disease)    Cataract    Depression    DISORDER, MENOPAUSAL  NOS 03/20/2007   Qualifier: Diagnosis of  By: Mavis MD, Norleen BRAVO    GERD (gastroesophageal reflux disease)    Hyperlipidemia    Hypothyroidism    MYOCARDIAL INFARCTION, HX OF 09/09/2007   Qualifier: Diagnosis of  By: Tammie MD, Bruce     Osteoporosis    Polymyalgia rheumatica (HCC)     Family History  Problem Relation Age of Onset   Hypertension Father    Heart disease Father        Mi age 36, 24, 12 and 89.   Cancer Father    Hyperlipidemia Father    Colon cancer Maternal Uncle    Hypertension Mother    Cancer Mother    Hyperlipidemia Mother    Pulmonary fibrosis Sister    Epilepsy Son    Healthy Son    Myasthenia gravis Son    Healthy Son    Rheum arthritis Niece    Lupus Niece    Rheum arthritis Niece     Past Surgical History:  Procedure Laterality Date   CATARACT EXTRACTION     CESAREAN SECTION     CHOLECYSTECTOMY N/A 11/24/2012   Procedure: LAPAROSCOPIC CHOLECYSTECTOMY;  Surgeon: Lynda Leos, MD;  Location: [facility] OR;  Service: General;  Laterality: N/A;   COSMETIC SURGERY     EYE SURGERY     eye  lids lifted   PTCA     REVERSE SHOULDER ARTHROPLASTY Right 08/12/2020   Procedure: REVERSE SHOULDER ARTHROPLASTY;  Surgeon: Sharl Selinda Dover, MD;  [facility];  Service: Orthopedics;  Laterality: Right;   Social History   Occupational History   Not on file  Tobacco Use   Smoking status: Never    Passive exposure: Never   Smokeless tobacco: Never  Vaping Use   Vaping status: Never Used  Substance and Sexual Activity   Alcohol use: No    Alcohol/week: 0.0 standard drinks of alcohol   Drug use: No   Sexual activity: Not on file

## 2023-03-04 ENCOUNTER — Encounter: Payer: Self-pay | Admitting: Rheumatology

## 2023-03-04 MED ORDER — PREDNISONE 5 MG PO TABS: ORAL_TABLET | ORAL | 0 refills | Status: DC

## 2023-03-04 NOTE — Telephone Encounter (Signed)
I called patient to advise that refill has been sent to [facility]. Patient verbalized understanding.

## 2023-03-04 NOTE — Telephone Encounter (Signed)
Last Fill: 12/18/2022  Next Visit: 05/16/2023  Last Visit: 01/15/2023  DX: Polymyalgia rheumatica   Current Dose per office note on 01/15/2023: Prednisone 5 mg daily. Unable to taper.   Okay to refill prednisone?

## 2023-03-05 ENCOUNTER — Other Ambulatory Visit: Payer: Self-pay | Admitting: Family Medicine

## 2023-03-06 ENCOUNTER — Other Ambulatory Visit: Payer: Self-pay

## 2023-03-06 ENCOUNTER — Ambulatory Visit: Payer: PPO | Admitting: Gastroenterology

## 2023-03-06 ENCOUNTER — Other Ambulatory Visit: Payer: Self-pay | Admitting: Family Medicine

## 2023-03-06 ENCOUNTER — Encounter: Payer: Self-pay | Admitting: Family Medicine

## 2023-03-06 MED ORDER — PAROXETINE HCL 20 MG PO TABS: 20.0000 mg | ORAL_TABLET | Freq: Every morning | ORAL | 2 refills | Status: DC

## 2023-03-06 MED ORDER — PAROXETINE HCL 20 MG PO TABS: 20.0000 mg | ORAL_TABLET | Freq: Every morning | ORAL | 0 refills | Status: DC

## 2023-03-08 ENCOUNTER — Other Ambulatory Visit (HOSPITAL_COMMUNITY): Payer: Self-pay

## 2023-03-10 ENCOUNTER — Other Ambulatory Visit: Payer: Self-pay | Admitting: Family Medicine

## 2023-03-10 DIAGNOSIS — E034 Atrophy of thyroid (acquired): Secondary | ICD-10-CM

## 2023-03-11 MED ORDER — PANTOPRAZOLE SODIUM 40 MG PO TBEC: 40.0000 mg | DELAYED_RELEASE_TABLET | Freq: Every day | ORAL | 0 refills | Status: DC

## 2023-03-11 MED ORDER — LEVOTHYROXINE SODIUM 100 MCG PO TABS
100.0000 ug | ORAL_TABLET | Freq: Every day | ORAL | 0 refills | Status: DC
Start: 2023-03-11 — End: 2023-03-12

## 2023-03-12 ENCOUNTER — Other Ambulatory Visit: Payer: Self-pay | Admitting: Family Medicine

## 2023-03-12 DIAGNOSIS — E034 Atrophy of thyroid (acquired): Secondary | ICD-10-CM

## 2023-03-12 MED ORDER — PANTOPRAZOLE SODIUM 40 MG PO TBEC: 40.0000 mg | DELAYED_RELEASE_TABLET | Freq: Every day | ORAL | 0 refills | Status: DC

## 2023-03-12 MED ORDER — LEVOTHYROXINE SODIUM 100 MCG PO TABS
100.0000 ug | ORAL_TABLET | Freq: Every day | ORAL | 0 refills | Status: DC
Start: 2023-03-12 — End: 2023-06-15

## 2023-03-14 ENCOUNTER — Encounter: Payer: Self-pay | Admitting: Orthopaedic Surgery

## 2023-03-14 DIAGNOSIS — G8929 Other chronic pain: Secondary | ICD-10-CM

## 2023-03-28 ENCOUNTER — Other Ambulatory Visit: Payer: Self-pay

## 2023-03-28 ENCOUNTER — Ambulatory Visit: Payer: Medicare Other | Admitting: Physical Medicine and Rehabilitation

## 2023-03-28 VITALS — BP 157/88 | HR 71

## 2023-03-28 DIAGNOSIS — M5416 Radiculopathy, lumbar region: Secondary | ICD-10-CM | POA: Diagnosis not present

## 2023-03-28 MED ORDER — METHYLPREDNISOLONE ACETATE 40 MG/ML IJ SUSP
40.0000 mg | Freq: Once | INTRAMUSCULAR | Status: AC
  Administered 2023-03-28: 40 mg

## 2023-03-28 NOTE — Patient Instructions (Signed)

## 2023-03-28 NOTE — Progress Notes (Signed)
Pain Score-5 No Numbness or Tingling

## 2023-04-02 ENCOUNTER — Ambulatory Visit (INDEPENDENT_AMBULATORY_CARE_PROVIDER_SITE_OTHER): Payer: Medicare Other

## 2023-04-02 DIAGNOSIS — Z0000 Encounter for general adult medical examination without abnormal findings: Secondary | ICD-10-CM

## 2023-04-02 NOTE — Patient Instructions (Addendum)
Ms. Breault , Thank you for taking time to come for your Medicare Wellness Visit. I appreciate your ongoing commitment to your health goals. Please review the following plan we discussed and let me know if I can assist you in the future.    Referrals/Orders/Follow-Ups/Clinician Recommendations: maintain health and activity  Aim for 30 minutes of exercise or brisk walking, 6-8 glasses of water, and 5 servings of fruits and vegetables each day.   This is a list of the screening recommended for you and due dates:  Health Maintenance  Topic Date Due   COVID-19 Vaccine (8 - 2024-25 season) 12/21/2022   Medicare Annual Wellness Visit  03/21/2023   Cologuard (Stool DNA test)  11/01/2023   Mammogram  08/09/2024   DTaP/Tdap/Td vaccine (3 - Td or Tdap) 04/27/2027   Pneumonia Vaccine  Completed   Flu Shot  Completed   DEXA scan (bone density measurement)  Completed   Hepatitis C Screening  Completed   Zoster (Shingles) Vaccine  Completed   HPV Vaccine  Aged Out   Colon Cancer Screening  Discontinued    Advanced directives: (Declined) Advance directive discussed with you today. Even though you declined this today, please call our office should you change your mind, and we can give you the proper paperwork for you to fill out.  Next Medicare Annual Wellness Visit scheduled for next year: Yes

## 2023-04-02 NOTE — Progress Notes (Signed)
Subjective:   [name] is a 73 y.o. who presents for Medicare Wellness preventive examination.  Visit Complete: Virtual I connected with  [name] on 04/02/23 by a audio enabled telemedicine application and verified that I am speaking with the correct person using two identifiers.  Patient Location: Home  Provider Location: Office/Clinic  I discussed the limitations of evaluation and management by telemedicine. The patient expressed understanding and agreed to proceed.  Vital Signs: Because this visit was a virtual/telehealth visit, some criteria may be missing or patient reported. Any vitals not documented were not able to be obtained and vitals that have been documented are patient reported.    AWV Questionnaire: No: Patient Medicare AWV questionnaire was not completed prior to this visit.  Cardiac Risk Factors include: advanced age (>39men, >75 women);dyslipidemia     Objective:    Today's Vitals   04/02/23 1458  Weight: 160 lb (72.6 kg)  Height: 5' 1.5" (1.562 m)   Body mass index is 29.74 kg/m.     04/02/2023    3:03 PM 05/15/2022    8:23 AM 05/15/2022    7:47 AM 03/20/2022    4:45 PM 03/10/2021    3:28 PM 09/04/2020   12:29 PM 08/10/2020    4:32 PM  Advanced Directives  Does Patient Have a Medical Advance Directive? No Yes No Yes No No No  Type of Advance Directive  Out of facility DNR (pink MOST or yellow form)  Healthcare Power of [place];Living will     Does patient want to make changes to medical advance directive?  No - Patient declined   No - Patient declined    Copy of Healthcare Power of Attorney in Chart?    No - copy requested     Would patient like information on creating a medical advance directive? No - Patient declined No - Patient declined No - Patient declined   No - Patient declined Yes (Inpatient - patient requests chaplain consult to create a medical advance directive)    Current Medications (verified) Outpatient Encounter Medications as  of 04/02/2023  Medication Sig   aspirin 81 MG EC tablet Take 81 mg by mouth at bedtime.   Evolocumab (REPATHA SURECLICK) 140 MG/ML SOAJ Inject 140 mg into the skin every 14 (fourteen) days.   levothyroxine (SYNTHROID) 100 MCG tablet Take 1 tablet (100 mcg total) by mouth daily.   pantoprazole (PROTONIX) 40 MG tablet Take 1 tablet (40 mg total) by mouth daily.   PARoxetine (PAXIL) 20 MG tablet Take 1 tablet (20 mg total) by mouth every morning.   predniSONE (DELTASONE) 5 MG tablet Take 1 tablet by mouth once daily with breakfast   traZODone (DESYREL) 50 MG tablet TAKE 1/2 TO 1 (ONE-HALF TO ONE) TABLET BY MOUTH AT BEDTIME AS NEEDED FOR SLEEP   [DISCONTINUED] cyanocobalamin (VITAMIN B12) 1000 MCG/ML injection INJECT 1000 MCG (1 MG) ONCE A WEEK FOR 4 WEEKS,  FOLLOWED  BY  1000  MCG  INJECTION  ONCE  PER  MONTH.   Facility-Administered Encounter Medications as of 04/02/2023  Medication   0.9 %  sodium chloride infusion   methylPREDNISolone acetate (DEPO-MEDROL) injection 40 mg    Allergies (verified) Morphine and codeine, Prochlorperazine, Simvastatin, and Sulfa antibiotics   History: Past Medical History:  Diagnosis Date   Anxiety    Arthritis    CAD (coronary artery disease)    Cataract    Depression    DISORDER, MENOPAUSAL NOS 03/20/2007   Qualifier: Diagnosis  of  By: Lovell Sheehan MD, Balinda Quails    GERD (gastroesophageal reflux disease)    Hyperlipidemia    Hypothyroidism    MYOCARDIAL INFARCTION, HX OF 09/09/2007   Qualifier: Diagnosis of  By: Cato Mulligan MD, Bruce     Osteoporosis    Polymyalgia rheumatica [facility])    Past Surgical History:  Procedure Laterality Date   CATARACT EXTRACTION     CESAREAN SECTION     CHOLECYSTECTOMY N/A 11/24/2012   Procedure: LAPAROSCOPIC CHOLECYSTECTOMY;  Surgeon: Axel Filler, MD;  Location: [facility] OR;  Service: General;  Laterality: N/A;   COSMETIC SURGERY     EYE SURGERY     eye lids lifted   PTCA     REVERSE SHOULDER ARTHROPLASTY Right 08/12/2020    Procedure: REVERSE SHOULDER ARTHROPLASTY;  Surgeon: Yolonda Kida, MD;  [facility];  Service: Orthopedics;  Laterality: Right;   Family History  Problem Relation Age of Onset   Hypertension Father    Heart disease Father        Mi age 36, 21, 52 and 72.   Cancer Father    Hyperlipidemia Father    Colon cancer Maternal Uncle    Hypertension Mother    Cancer Mother    Hyperlipidemia Mother    Pulmonary fibrosis Sister    Epilepsy Son    Healthy Son    Myasthenia gravis Son    Healthy Son    Rheum arthritis Niece    Lupus Niece    Rheum arthritis Niece    Social History   Socioeconomic History   Marital status: Married    Spouse name: Not on file   Number of children: Not on file   Years of education: Not on file   Highest education level: Not on file  Occupational History   Not on file  Tobacco Use   Smoking status: Never    Passive exposure: Never   Smokeless tobacco: Never  Vaping Use   Vaping status: Never Used  Substance and Sexual Activity   Alcohol use: No    Alcohol/week: 0.0 standard drinks of alcohol   Drug use: No   Sexual activity: Not on file  Other Topics Concern   Not on file  Social History Narrative   Husband and 2 adult children live at home. No grandkids. Son with epilepsy.      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     Social Drivers of Corporate investment banker Strain: Low Risk  (04/02/2023)   Overall Financial Resource Strain (CARDIA)    Difficulty of Paying Living Expenses: Not hard at all  Food Insecurity: No Food Insecurity (04/02/2023)   Hunger Vital Sign    Worried About Running Out of Food in the Last Year: Never true    Ran Out of Food in the Last Year: Never true  Transportation Needs: No Transportation Needs (04/02/2023)   PRAPARE - Administrator, Civil Service (Medical): No    Lack of Transportation (Non-Medical): No  Physical Activity: Inactive (04/02/2023)   Exercise Vital Sign    Days of  Exercise per Week: 0 days    Minutes of Exercise per Session: 0 min  Stress: No Stress Concern Present (04/02/2023)   [employer] of Occupational Health - Occupational Stress Questionnaire    Feeling of Stress : Only a little  Social Connections: Socially Integrated (04/02/2023)   Social Connection and Isolation Panel [NHANES]    Frequency of Communication with  Friends and Family: More than three times a week    Frequency of Social Gatherings with Friends and Family: More than three times a week    Attends Religious Services: More than 4 times per year    Active Member of [employer] or Organizations: Yes    Attends Banker Meetings: 1 to 4 times per year    Marital Status: Married    Tobacco Counseling Counseling given: Not Answered    Clinical Intake:  Pre-visit preparation completed: Yes  Pain : No/denies pain     BMI - recorded: 29.74 Nutritional Status: BMI 25 -29 Overweight Diabetes: No  How often do you need to have someone help you when you read instructions, pamphlets, or other written materials from your doctor or pharmacy?: 1 - Never  Interpreter Needed?: No  Information entered by :: Lanier Ensign, LPN   Activities of Daily Living     04/02/2023    3:00 PM  In your present state of health, do you have any difficulty performing the following activities:  Hearing? 0  Vision? 0  Difficulty concentrating or making decisions? 0  Walking or climbing stairs? 0  Dressing or bathing? 0  Doing errands, shopping? 0  Preparing Food and eating ? N  Using the Toilet? N  In the past six months, have you accidently leaked urine? N  Do you have problems with loss of bowel control? N  Managing your Medications? N  Managing your Finances? N  Housekeeping or managing your Housekeeping? N    Patient Care Team: Shelva Majestic, MD as PCP - General (Family Medicine) Marcine Matar, MD as Consulting Physician (Urology) Pollyann Savoy, MD as  Consulting Physician (Rheumatology) Armbruster, Willaim Rayas, MD as Consulting Physician (Gastroenterology) Eldred Manges, MD as Consulting Physician (Orthopedic Surgery) Rollene Rotunda, MD as Consulting Physician (Cardiology) Rollene Rotunda, MD as Attending Physician (Cardiology)  Indicate any recent Medical Services you may have received from other than [facility] providers in the past year (date may be approximate).     Assessment:   This is a routine wellness examination for Imagene.  Hearing/Vision screen Hearing Screening - Comments:: Pt denies any hearing issues  Vision Screening - Comments:: Pt follows up with dr Dione Booze for annual eye exams    Goals Addressed             This Visit's Progress    Patient Stated       Lose weight        Depression Screen     04/02/2023    3:01 PM 04/25/2022    8:46 AM 03/20/2022    4:47 PM 10/25/2021   11:41 AM 03/10/2021    3:27 PM 06/21/2020    2:17 PM 01/01/2019    2:44 PM  PHQ 2/9 Scores  PHQ - 2 Score 0 0 0 0 0 0 0  PHQ- 9 Score  0  2  1 0    Fall Risk     04/02/2023    3:04 PM 04/25/2022    8:45 AM 03/20/2022    4:46 PM 03/10/2021    3:29 PM 06/21/2020    2:17 PM  Fall Risk   Falls in the past year? 0 0 0 1 1  Number falls in past yr: 0 0 0 1 1  Injury with Fall? 0 0 0 1 0  Comment    right shoulder   Risk for fall due to : No Fall Risks No Fall Risks Impaired vision Impaired  vision   Follow up Falls prevention discussed;Falls evaluation completed Falls evaluation completed Falls prevention discussed Falls prevention discussed     MEDICARE RISK AT HOME:  Medicare Risk at Home Any stairs in or around the home?: No If so, are there any without handrails?: No Home free of loose throw rugs in walkways, pet beds, electrical cords, etc?: Yes Adequate lighting in your home to reduce risk of falls?: Yes Life alert?: No Use of a cane, walker or w/c?: No Grab bars in the bathroom?: No Shower chair or bench in shower?: No Elevated  toilet seat or a handicapped toilet?: No  TIMED UP AND GO:  Was the test performed?  No  Cognitive Function: 6CIT completed        04/02/2023    3:10 PM 03/20/2022    4:49 PM 03/10/2021    3:32 PM  6CIT Screen  What Year? 0 points 0 points 0 points  What month? 0 points 0 points 0 points  What time? 0 points 0 points 0 points  Count back from 20 0 points 0 points 0 points  Months in reverse 0 points 0 points 0 points  Repeat phrase 0 points 0 points 0 points  Total Score 0 points 0 points 0 points    Immunizations Immunization History  Administered Date(s) Administered   Fluad Quad(high Dose 65+) 11/10/2019, 11/21/2020, 11/28/2021   Influenza Whole 11/09/2008   Influenza, High Dose Seasonal PF 10/30/2016, 12/02/2017, 11/08/2018, 10/26/2022   Influenza-Unspecified 11/08/2018   Moderna Covid-19 Fall Seasonal Vaccine 30yrs & older 10/26/2022   PFIZER Comirnaty(Gray Top)Covid-19 Tri-Sucrose Vaccine 11/16/2021   PFIZER(Purple Top)SARS-COV-2 Vaccination 02/26/2019, 03/20/2019, 11/28/2019   PNEUMOCOCCAL CONJUGATE-20 06/21/2020   Pfizer Covid-19 Vaccine Bivalent Booster 37yrs & up 11/21/2020, 07/22/2021   Pneumococcal Conjugate-13 10/11/2014   Pneumococcal Polysaccharide-23 12/15/2015   Rsv, Bivalent, Protein Subunit Rsvpref,pf (Abrysvo) 11/16/2021   Td 02/12/2006   Td (Adult), 2 Lf Tetanus Toxid, Preservative Free 02/12/2006   Tdap 04/26/2017   Zoster Recombinant(Shingrix) 07/18/2021, 09/19/2021   Zoster, Live 09/19/2010    Screening Tests Health Maintenance  Topic Date Due   COVID-19 Vaccine (8 - 2024-25 season) 12/21/2022   Fecal DNA (Cologuard)  11/01/2023   Medicare Annual Wellness (AWV)  04/01/2024   MAMMOGRAM  08/09/2024   DTaP/Tdap/Td (3 - Td or Tdap) 04/27/2027   Pneumonia Vaccine 43+ Years old  Completed   INFLUENZA VACCINE  Completed   DEXA SCAN  Completed   Hepatitis C Screening  Completed   Zoster Vaccines- Shingrix  Completed   HPV VACCINES  Aged Out    Colonoscopy  Discontinued    Health Maintenance  Health Maintenance Due  Topic Date Due   COVID-19 Vaccine (8 - 2024-25 season) 12/21/2022   Health Maintenance Items Addressed:    Additional Screening:  Vision Screening: Recommended annual ophthalmology exams for early detection of glaucoma and other disorders of the eye.  Dental Screening: Recommended annual dental exams for proper oral hygiene  Community Resource Referral / Chronic Care Management: CRR required this visit?  No   CCM required this visit?  No     Plan:     I have personally reviewed and noted the following in the patient's chart:   Medical and social history Use of alcohol, tobacco or illicit drugs  Current medications and supplements including opioid prescriptions. Patient is not currently taking opioid prescriptions. Functional ability and status Nutritional status Physical activity Advanced directives List of other physicians Hospitalizations, surgeries, and ER visits in previous  12 months Vitals Screenings to include cognitive, depression, and falls Referrals and appointments  In addition, I have reviewed and discussed with patient certain preventive protocols, quality metrics, and best practice recommendations. A written personalized care plan for preventive services as well as general preventive health recommendations were provided to patient.     Marzella Schlein, LPN   1/61/0960   After Visit Summary: (MyChart) Due to this being a telephonic visit, the after visit summary with patients personalized plan was offered to patient via MyChart   Notes: Nothing significant to report at this time.

## 2023-04-08 NOTE — Procedures (Signed)
 Lumbar Epidural Steroid Injection - Interlaminar Approach with Fluoroscopic Guidance  Patient: [name]      Date of Birth: 06/16/1950 MRN: [id number] PCP: Shelva Majestic, MD      Visit Date: 03/28/2023   Universal Protocol:     Consent Given By: the patient  Position: PRONE  Additional Comments: Vital signs were monitored before and after the procedure. Patient was prepped and draped in the usual sterile fashion. The correct patient, procedure, and site was verified.   Injection Procedure Details:   Procedure diagnoses: Lumbar radiculopathy [M54.16]   Meds Administered:  Meds ordered this encounter  Medications   methylPREDNISolone acetate (DEPO-MEDROL) injection 40 mg     Laterality: Left  Location/Site:  L2-3  Needle: 3.5 in., 20 ga. Tuohy  Needle Placement: Paramedian epidural  Findings:   -Comments: Excellent flow of contrast into the epidural space.  Procedure Details: Using a paramedian approach from the side mentioned above, the region overlying the inferior lamina was localized under fluoroscopic visualization and the soft tissues overlying this structure were infiltrated with 4 ml. of 1% Lidocaine without Epinephrine. The Tuohy needle was inserted into the epidural space using a paramedian approach.   The epidural space was localized using loss of resistance along with counter oblique bi-planar fluoroscopic views.  After negative aspirate for air, blood, and CSF, a 2 ml. volume of Isovue-250 was injected into the epidural space and the flow of contrast was observed. Radiographs were obtained for documentation purposes.    The injectate was administered into the level noted above.   Additional Comments:  The patient tolerated the procedure well Dressing: 2 x 2 sterile gauze and Band-Aid    Post-procedure details: Patient was observed during the procedure. Post-procedure instructions were reviewed.  Patient left the clinic in stable  condition.

## 2023-04-08 NOTE — Progress Notes (Signed)
 [name] - 73 y.o. female MRN [id number]  Date of birth: 10-09-1950  Office Visit Note: Visit Date: 03/28/2023 PCP: Shelva Majestic, MD Referred by: Shelva Majestic, MD  Subjective: Chief Complaint  Patient presents with   Middle Back - Pain   HPI:  [name] is a 73 y.o. female who comes in today at the request of Dr. Annell Greening for planned Left L2-3 Lumbar Interlaminar epidural steroid injection with fluoroscopic guidance.  The patient has failed conservative care including home exercise, medications, time and activity modification.  This injection will be diagnostic and hopefully therapeutic.  Please see requesting physician notes for further details and justification.  Similar injection years ago helped her quite a bit and she feels like this is similar complaints.  Depending on relief however would look at epidural versus facet joint block at L4-5 or updated MRI of the lumbar spine.   ROS Otherwise per HPI.  Assessment & Plan: Visit Diagnoses:    ICD-10-CM   1. Lumbar radiculopathy  M54.16 XR C-ARM NO REPORT    Epidural Steroid injection    methylPREDNISolone acetate (DEPO-MEDROL) injection 40 mg      Plan: No additional findings.   Meds & Orders:  Meds ordered this encounter  Medications   methylPREDNISolone acetate (DEPO-MEDROL) injection 40 mg    Orders Placed This Encounter  Procedures   XR C-ARM NO REPORT   Epidural Steroid injection    Follow-up: Return for visit to requesting provider as needed.   Procedures: No procedures performed  Lumbar Epidural Steroid Injection - Interlaminar Approach with Fluoroscopic Guidance  Patient: [name]      Date of Birth: Sep 04, 1950 MRN: [id number] PCP: Shelva Majestic, MD      Visit Date: 03/28/2023   Universal Protocol:     Consent Given By: the patient  Position: PRONE  Additional Comments: Vital signs were monitored before and after the procedure. Patient was prepped and draped in the  usual sterile fashion. The correct patient, procedure, and site was verified.   Injection Procedure Details:   Procedure diagnoses: Lumbar radiculopathy [M54.16]   Meds Administered:  Meds ordered this encounter  Medications   methylPREDNISolone acetate (DEPO-MEDROL) injection 40 mg     Laterality: Left  Location/Site:  L2-3  Needle: 3.5 in., 20 ga. Tuohy  Needle Placement: Paramedian epidural  Findings:   -Comments: Excellent flow of contrast into the epidural space.  Procedure Details: Using a paramedian approach from the side mentioned above, the region overlying the inferior lamina was localized under fluoroscopic visualization and the soft tissues overlying this structure were infiltrated with 4 ml. of 1% Lidocaine without Epinephrine. The Tuohy needle was inserted into the epidural space using a paramedian approach.   The epidural space was localized using loss of resistance along with counter oblique bi-planar fluoroscopic views.  After negative aspirate for air, blood, and CSF, a 2 ml. volume of Isovue-250 was injected into the epidural space and the flow of contrast was observed. Radiographs were obtained for documentation purposes.    The injectate was administered into the level noted above.   Additional Comments:  The patient tolerated the procedure well Dressing: 2 x 2 sterile gauze and Band-Aid    Post-procedure details: Patient was observed during the procedure. Post-procedure instructions were reviewed.  Patient left the clinic in stable condition.   Clinical History: MRI LUMBAR SPINE WITHOUT CONTRAST   TECHNIQUE: Multiplanar, multisequence MR imaging of the lumbar spine was performed.  No intravenous contrast was administered.   COMPARISON:  MRI of the lumbar spine June 12, 2014   FINDINGS: Segmentation:  Standard.   Alignment:  There is a levoscoliosis of the lumbar spine.   Vertebrae: Chronic fracture of the L1 vertebral body with loss  of approximately 60% of the vertebral body height with mild retropulsion of the superior aspect of the posterior wall into the spinal canal without significant spinal canal stenosis.   Marrow edema is seen in the is superior articular process of L5 on the left side related to degenerative changes.   No acute fracture, evidence of discitis, or bone lesion.   Conus medullaris and cauda equina: Conus extends to the T12-L1 level. Conus and cauda equina appear normal.   Paraspinal and other soft tissues: Negative.   Disc levels:   T12-L1: Small retropulsion of the posterior wall into the spinal canal without significant spinal canal stenosis. Perineural cysts noted on the right side. There is no neural foraminal stenosis.   L1-2: No spinal canal or neural foraminal stenosis.   L2-3: Shallow disc bulge resulting in mild bilateral neural foraminal narrowing. No spinal canal stenosis.   L3-4: Shallow disc bulge and mild facet degenerative changes with ligamentum flavum redundancy resulting in mild left neural foraminal narrowing. No spinal canal stenosis.   L4-5: Disc bulge with superimposed left central disc protrusion, facet degenerative changes with mild ligamentum flavum redundancy resulting in mild spinal canal stenosis with narrowing of the left subarticular zone, mild right and moderate left neural foraminal narrowing.   L5-S1: Facet degenerative changes, left greater than right. No spinal canal or neural foraminal stenosis.   IMPRESSION: 1. No acute fracture of the lumbar spine. 2. Chronic L1 compression fracture with loss of approximately 60% of vertebral body height with mild retropulsion of the posterior wall into the spinal canal without significant spinal canal stenosis. 3. Multilevel degenerative changes of the lumbar spine, worst at L4-L5 with mild spinal canal stenosis, narrowing of the left subarticular zone, mild right and moderate left neural  foraminal narrowing.     Electronically Signed   By: Baldemar Lenis M.D.   On: 04/24/2019 11:13     Objective:  VS:  HT:    WT:   BMI:     BP:(!) 157/88  HR:71bpm  TEMP: ( )  RESP:  Physical Exam Vitals and nursing note reviewed.  Constitutional:      General: She is not in acute distress.    Appearance: Normal appearance. She is not ill-appearing.  HENT:     Head: Normocephalic and atraumatic.     Right Ear: External ear normal.     Left Ear: External ear normal.  Eyes:     Extraocular Movements: Extraocular movements intact.  Cardiovascular:     Rate and Rhythm: Normal rate.     Pulses: Normal pulses.  Pulmonary:     Effort: Pulmonary effort is normal. No respiratory distress.  Abdominal:     General: There is no distension.     Palpations: Abdomen is soft.  Musculoskeletal:        General: Tenderness present.     Cervical back: Neck supple.     Right lower leg: No edema.     Left lower leg: No edema.     Comments: Patient has good distal strength with no pain over the greater trochanters.  No clonus or focal weakness.  Skin:    Findings: No erythema, lesion or rash.  Neurological:  General: No focal deficit present.     Mental Status: She is alert and oriented to person, place, and time.     Sensory: No sensory deficit.     Motor: No weakness or abnormal muscle tone.     Coordination: Coordination normal.  Psychiatric:        Mood and Affect: Mood normal.        Behavior: Behavior normal.      Imaging: No results found.

## 2023-04-10 ENCOUNTER — Other Ambulatory Visit: Payer: Self-pay | Admitting: Pharmacist

## 2023-04-10 NOTE — Progress Notes (Signed)
Error

## 2023-04-26 ENCOUNTER — Encounter: Payer: Self-pay | Admitting: Orthopaedic Surgery

## 2023-04-26 DIAGNOSIS — M5416 Radiculopathy, lumbar region: Secondary | ICD-10-CM

## 2023-04-29 ENCOUNTER — Telehealth: Payer: Self-pay | Admitting: Pharmacist

## 2023-04-29 NOTE — Telephone Encounter (Signed)
 Patient due for Reclast on 05/07/2023. Will need updated labs at [facility] on 05/16/2023  Chesley Mires, PharmD, MPH, BCPS, CPP Clinical Pharmacist (Rheumatology and Pulmonology)

## 2023-04-30 MED ORDER — DIAZEPAM 5 MG PO TABS: ORAL_TABLET | ORAL | 0 refills | Status: DC

## 2023-04-30 NOTE — Addendum Note (Signed)
 Addended by: Rogers Seeds on: 04/30/2023 11:22 AM   Modules accepted: Orders

## 2023-05-01 DIAGNOSIS — I10 Essential (primary) hypertension: Secondary | ICD-10-CM | POA: Insufficient documentation

## 2023-05-01 NOTE — Progress Notes (Unsigned)
  Cardiology Office Note:   Date:  05/02/2023  ID:  [name], DOB 10-17-1950, MRN [id number] PCP: Shelva Majestic, MD  [facility] Providers Cardiologist:  Rollene Rotunda, MD {  History of Present Illness:   [name] is a 73 y.o. female who presents for follow-up of coronary disease. He's been several years since she was seen. She had a marginal branch myocardial infarction in 2009. There was a long 99% stenosis which was reduced to 50% stenosis with dissection and TIMI-3 flow. She was managed medically this and has done well.    Since I last saw her she has done okay.  She is limited by back pain.  She might need a procedure for this.  With the activity she can do she denies any cardiovascular symptoms. The patient denies any new symptoms such as chest discomfort, neck or arm discomfort. There has been no new shortness of breath, PND or orthopnea. There have been no reported palpitations, presyncope or syncope.   ROS: As stated in the HPI and negative for all other systems.  Studies Reviewed:    EKG:   EKG Interpretation Date/Time:  Thursday May 02 2023 13:01:27 EDT Ventricular Rate:  70 PR Interval:  154 QRS Duration:  78 QT Interval:  412 QTC Calculation: 444 R Axis:   -25  Text Interpretation: Normal sinus rhythm Nonspecific T wave abnormality When compared with ECG of 15-May-2022 07:09, No significant change since last tracing Confirmed by Rollene Rotunda ([id number]) on 05/02/2023 1:07:05 PM    Risk Assessment/Calculations:           Physical Exam:   VS:  BP (!) 160/96 (BP Location: Right Arm, Patient Position: Sitting, Cuff Size: Normal)   Pulse 70   Ht 5' 1.5" (1.562 m)   Wt 159 lb (72.1 kg)   SpO2 93%   BMI 29.56 kg/m    Wt Readings from Last 3 Encounters:  05/02/23 159 lb (72.1 kg)  04/02/23 160 lb (72.6 kg)  02/25/23 160 lb (72.6 kg)     GEN: Well nourished, well developed in no acute distress NECK: No JVD; No carotid bruits CARDIAC: RRR,  no murmurs, rubs, gallops RESPIRATORY:  Clear to auscultation without rales, wheezing or rhonchi  ABDOMEN: Soft, non-tender, non-distended EXTREMITIES:  No edema; No deformity   ASSESSMENT AND PLAN:   CAD:  The patient has no new sypmtoms.  No further cardiovascular testing is indicated.  We will continue with aggressive risk reduction and meds as listed.   DYSLIPIDEMIA: She has not had a recent lipid profile.  I will have her come back for fasting lipid profile with goal LDL in the 50s.  HTN: Her blood pressure is not at target.  I am going to add amlodipine 2.5 mg daily and she can keep a blood pressure diary.    Follow up with me in 1 year  Signed, Rollene Rotunda, MD

## 2023-05-02 ENCOUNTER — Other Ambulatory Visit: Payer: Self-pay

## 2023-05-02 ENCOUNTER — Ambulatory Visit: Payer: PPO | Attending: Cardiology | Admitting: Cardiology

## 2023-05-02 ENCOUNTER — Encounter: Payer: Self-pay | Admitting: Cardiology

## 2023-05-02 VITALS — BP 160/96 | HR 70 | Ht 61.5 in | Wt 159.0 lb

## 2023-05-02 DIAGNOSIS — E785 Hyperlipidemia, unspecified: Secondary | ICD-10-CM

## 2023-05-02 DIAGNOSIS — I2510 Atherosclerotic heart disease of native coronary artery without angina pectoris: Secondary | ICD-10-CM | POA: Diagnosis not present

## 2023-05-02 DIAGNOSIS — I10 Essential (primary) hypertension: Secondary | ICD-10-CM | POA: Diagnosis not present

## 2023-05-02 MED ORDER — AMLODIPINE BESYLATE 2.5 MG PO TABS: 2.5000 mg | ORAL_TABLET | Freq: Every day | ORAL | 3 refills | Status: DC

## 2023-05-02 NOTE — Progress Notes (Unsigned)
 Office Visit Note  Patient: [name]             Date of Birth: February 23, 1950           MRN: [id number]             PCP: Shelva Majestic, MD Referring: Shelva Majestic, MD Visit Date: 05/16/2023 Occupation: @GUAROCC @  Subjective:  Due to reclast   History of Present Illness: [name] is a 73 y.o. female with history of osteoporosis and osteoarthritis.  Patient received IV Reclast on 05/07/2022.  She has tolerated Reclast without any side effects.  Patient denies any falls or fractures since her last office visit.  She continues to have chronic pain in her lower back.  Patient is scheduled to have an MRI of the lumbar spine on 05/23/2023.  Patient is aware that she is due for her next IV Reclast infusion as well as due for a DEXA in June 2025.   Activities of Daily Living:  Patient reports morning stiffness for 30 minutes.   Patient Reports nocturnal pain.  Difficulty dressing/grooming: Denies Difficulty climbing stairs: Reports Difficulty getting out of chair: Denies Difficulty using hands for taps, buttons, cutlery, and/or writing: Denies  Review of Systems  Constitutional:  Positive for fatigue.  HENT:  Negative for mouth sores, mouth dryness and nose dryness.   Eyes:  Negative for pain and dryness.  Respiratory:  Positive for shortness of breath. Negative for difficulty breathing.   Cardiovascular:  Positive for chest pain. Negative for palpitations.  Gastrointestinal:  Negative for blood in stool, constipation and diarrhea.  Endocrine: Negative for increased urination.  Genitourinary:  Negative for involuntary urination.  Musculoskeletal:  Positive for joint pain, gait problem, joint pain, myalgias, morning stiffness, muscle tenderness and myalgias. Negative for joint swelling and muscle weakness.  Skin:  Negative for color change, rash, hair loss and sensitivity to sunlight.  Allergic/Immunologic: Negative for susceptible to infections.  Neurological:  Negative  for dizziness and headaches.  Hematological:  Negative for swollen glands.  Psychiatric/Behavioral:  Positive for sleep disturbance. Negative for depressed mood. The patient is not nervous/anxious.     PMFS History:  Patient Active Problem List   Diagnosis Date Noted   Essential hypertension 05/01/2023   B12 deficiency 04/26/2022   Hyperglycemia 04/25/2022   Unilateral primary osteoarthritis, left hip 08/24/2021   Shoulder fracture 08/10/2020   Lumbar compression fracture (HCC) 05/11/2019   Dyslipidemia 01/26/2019   Drug-induced myopathy 01/14/2019   Haglund's deformity 01/12/2019   High risk medication use 12/04/2018   Lower esophageal ring ([place]) 03/03/2018   Osteoarthritis of right hip 12/02/2017   GERD (gastroesophageal reflux disease) 12/15/2015   Polyp of colon, adenomatous 10/11/2014   Vitamin D deficiency 05/26/2014   Osteoporosis 05/26/2014   Polymyalgia rheumatica (HCC) 12/01/2010   CAD (coronary artery disease) 09/09/2007   Hematuria 09/09/2007   Depression 03/20/2007   Hypothyroid 02/13/1999    Past Medical History:  Diagnosis Date   Anxiety    Arthritis    CAD (coronary artery disease)    Cataract    Depression    DISORDER, MENOPAUSAL NOS 03/20/2007   Qualifier: Diagnosis of  By: Lovell Sheehan MD, Balinda Quails    GERD (gastroesophageal reflux disease)    Hyperlipidemia    Hypothyroidism    MYOCARDIAL INFARCTION, HX OF 09/09/2007   Qualifier: Diagnosis of  By: Cato Mulligan MD, Bruce     Osteoporosis    Polymyalgia rheumatica [facility])     Family History  Problem Relation Age of Onset   Hypertension Father    Heart disease Father        Mi age 40, 76, 27 and 68.   Cancer Father    Hyperlipidemia Father    Colon cancer Maternal Uncle    Hypertension Mother    Cancer Mother    Hyperlipidemia Mother    Pulmonary fibrosis Sister    Epilepsy Son    Healthy Son    Myasthenia gravis Son    Healthy Son    Rheum arthritis Niece    Lupus Niece    Rheum arthritis Niece     Past Surgical History:  Procedure Laterality Date   CATARACT EXTRACTION     CESAREAN SECTION     CHOLECYSTECTOMY N/A 11/24/2012   Procedure: LAPAROSCOPIC CHOLECYSTECTOMY;  Surgeon: Axel Filler, MD;  Location: [facility] OR;  Service: General;  Laterality: N/A;   COSMETIC SURGERY     EYE SURGERY     eye lids lifted   PTCA     REVERSE SHOULDER ARTHROPLASTY Right 08/12/2020   Procedure: REVERSE SHOULDER ARTHROPLASTY;  Surgeon: Yolonda Kida, MD;  Location: [facility] OR;  Service: Orthopedics;  Laterality: Right;   Social History   Social History Narrative   Husband and 2 adult children live at home. No grandkids. Son with epilepsy.      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     Immunization History  Administered Date(s) Administered   Fluad Quad(high Dose 65+) 11/10/2019, 11/21/2020, 11/28/2021   Influenza Whole 11/09/2008   Influenza, High Dose Seasonal PF 10/30/2016, 12/02/2017, 11/08/2018, 10/26/2022   Influenza-Unspecified 11/08/2018   Moderna Covid-19 Fall Seasonal Vaccine 29yrs & older 10/26/2022   PFIZER Comirnaty(Gray Top)Covid-19 Tri-Sucrose Vaccine 11/16/2021   PFIZER(Purple Top)SARS-COV-2 Vaccination 02/26/2019, 03/20/2019, 11/28/2019   PNEUMOCOCCAL CONJUGATE-20 06/21/2020   Pfizer Covid-19 Vaccine Bivalent Booster 32yrs & up 11/21/2020, 07/22/2021   Pneumococcal Conjugate-13 10/11/2014   Pneumococcal Polysaccharide-23 12/15/2015   Rsv, Bivalent, Protein Subunit Rsvpref,pf Verdis Frederickson) 11/16/2021   Td 02/12/2006   Td (Adult), 2 Lf Tetanus Toxid, Preservative Free 02/12/2006   Tdap 04/26/2017   Zoster Recombinant(Shingrix) 07/18/2021, 09/19/2021   Zoster, Live 09/19/2010     Objective: Vital Signs: BP (!) 156/92 (BP Location: Left Arm, Patient Position: Sitting, Cuff Size: Normal)   Pulse 68   Resp 14   Ht 5' 1.5" (1.562 m)   Wt 157 lb (71.2 kg)   BMI 29.18 kg/m    Physical Exam Vitals and nursing note reviewed.  Constitutional:      Appearance: She is  well-developed.  HENT:     Head: Normocephalic and atraumatic.  Eyes:     Conjunctiva/sclera: Conjunctivae normal.  Cardiovascular:     Rate and Rhythm: Normal rate and regular rhythm.     Heart sounds: Normal heart sounds.  Pulmonary:     Effort: Pulmonary effort is normal.     Breath sounds: Normal breath sounds.  Abdominal:     General: Bowel sounds are normal.     Palpations: Abdomen is soft.  Musculoskeletal:     Cervical back: Normal range of motion.  Lymphadenopathy:     Cervical: No cervical adenopathy.  Skin:    General: Skin is warm and dry.     Capillary Refill: Capillary refill takes less than 2 seconds.  Neurological:     Mental Status: She is alert and oriented to person, place, and time.  Psychiatric:        Behavior: Behavior  normal.      Musculoskeletal Exam: C-spine has slightly limited range of motion with lateral rotation.  Thoracic kyphosis noted.  Limited mobility of the lumbar spine.  Right shoulder has limited abduction, forward flexion, and internal rotation.  Left shoulder has full range of motion.  Elbow joints, wrist joints, MCPs, PIPs, DIPs have good range of motion with no synovitis.  Knee joints have good range of motion no warmth or effusion.  Ankle joints have good range of motion with no tenderness or joint swelling.  CDAI Exam: CDAI Score: -- Patient Global: --; Provider Global: -- Swollen: --; Tender: -- Joint Exam 05/16/2023   No joint exam has been documented for this visit   There is currently no information documented on the homunculus. Go to the Rheumatology activity and complete the homunculus joint exam.  Investigation: No additional findings.  Imaging: No results found.  Recent Labs: Lab Results  Component Value Date   WBC 10.8 (H) 05/15/2022   HGB 13.1 05/15/2022   PLT 245 05/15/2022   NA 137 05/15/2022   K 3.5 05/15/2022   CL 107 05/15/2022   CO2 19 (L) 05/15/2022   GLUCOSE 122 (H) 05/15/2022   BUN 19 05/15/2022    CREATININE 1.04 (H) 05/15/2022   BILITOT 1.0 05/15/2022   ALKPHOS 79 05/15/2022   AST 27 05/15/2022   ALT 20 05/15/2022   PROT 5.0 (L) 05/15/2022   ALBUMIN 2.3 (L) 05/15/2022   CALCIUM 7.5 (L) 05/15/2022   GFRAA 75 04/22/2020   QFTBGOLDPLUS NEGATIVE 08/12/2018    Speciality Comments: Forteo October 2021-February 2022 restarted June 2022 (missed 3 weeks due to fracture)  Should be completing Forteo in December 2023  Procedures:  No procedures performed Allergies: Morphine and codeine, Prochlorperazine, Simvastatin, and Sulfa antibiotics    Assessment / Plan:     Visit Diagnoses: Age-related osteoporosis without current pathological fracture - DEXA 08/07/2021:Bilateral FN BMD 0.619 T-score -2.1.  L1 compression fracture.  Long-term systemic prednisone use-prednisone 5 mg daily currently. Forteo started October 2021 (gap February 22 till June 22).Completed Forteo in Jan 2024.  IV Reclast started 05/07/22.  She tolerated Reclast without any side effects.  Patient is open to scheduling her next IV Reclast infusion pending lab results today. No recent falls or fractures. Plan to check CBC, CMP, and vitamin D today. DEXA due in June 2025--order placed today. She will follow up in 6 months or sooner if needed.  - Plan: VITAMIN D 25 Hydroxy (Vit-D Deficiency, Fractures), DG BONE DENSITY (DXA)  Medication monitoring encounter - IV reclast started on 05/07/22.  Due for next IV Reclast infusion.  Plan to obtain the following lab work today prior to scheduling her next Reclast dose.- Plan: CBC with Differential/Platelet, Comprehensive metabolic panel with GFR, VITAMIN D 25 Hydroxy (Vit-D Deficiency, Fractures)  History of compression fracture of spine - Previous L1 compression fracture with 60% vertebral body height loss.  Vitamin D deficiency -Plan to update vitamin D today.  Plan: VITAMIN D 25 Hydroxy (Vit-D Deficiency, Fractures)  Polymyalgia rheumatica (HCC) - DXd 2015 by another  rheumatologist.  Patient remains on prednisone 5 mg daily and has been unable to taper.  Long term (current) use of systemic steroids: Patient remains on prednisone 5 mg daily and has been unable to taper.  She is aware of the risks of long-term prednisone use. Hemoglobin A1c 6.0% 04/25/2022.   High risk medication use - Discontinued methotrexate in April 2022 as she did not notice any benefit.  S/P  shoulder replacement, right: Limited range of motion with abduction, internal rotation, and forward flexion.  Primary osteoarthritis of both hands: She has PIP and DIP thickening consistent with osteoarthritis of both hands.  No active inflammation noted.  Primary osteoarthritis of right hip: No groin pain currently.   Trochanteric bursitis, right hip: Intermittent discomfort.   Spondylosis of lumbar spine - Previously under the care of Dr. Ophelia Charter (retired): Chronic pain.  The severity of pain continues to limit her activity.  Patient is scheduled for an MRI of the lumbar spine on 05/23/2023.  Patient will likely be establishing care with Dr. Christell Constant or will reestablish care with Dr. Alvester Morin.  She has reached out to Ortho care to see who will notify her with the MRI results since the order was placed by Dr. Ophelia Charter.   Primary osteoarthritis of both feet: She is not experiencing any increased discomfort in her feet at this time.  Other medical conditions are listed as follows:   History of gastroesophageal reflux (GERD)  Coronary artery disease involving native coronary artery of native heart without angina pectoris  Adenomatous polyp of colon, unspecified part of colon  History of depression  History of hypothyroidism  Vitamin B12 deficiency - Patient requested to have vitamin B12 rechecked today. Plan: Vitamin B12  Orders: Orders Placed This Encounter  Procedures   DG BONE DENSITY (DXA)   CBC with Differential/Platelet   Comprehensive metabolic panel with GFR   VITAMIN D 25 Hydroxy (Vit-D  Deficiency, Fractures)   Vitamin B12   No orders of the defined types were placed in this encounter.    Follow-Up Instructions: Return in about 6 months (around 11/15/2023) for Osteoporosis, Osteoarthritis.   Gearldine Bienenstock, PA-C  Note - This record has been created using Dragon software.  Chart creation errors have been sought, but may not always  have been located. Such creation errors do not reflect on  the standard of medical care.

## 2023-05-02 NOTE — Patient Instructions (Signed)
 Medication Instructions:  Amlodipine 2.5 mg by mouth daily. New script sent.  *If you need a refill on your cardiac medications before your next appointment, please call your pharmacy*   Lab Work: Fasting Lipid profile no appointment needed.  If you have labs (blood work) drawn today and your tests are completely normal, you will receive your results only by: MyChart Message (if you have MyChart) OR A paper copy in the mail If you have any lab test that is abnormal or we need to change your treatment, we will call you to review the results.  Follow-Up: At [facility], you and your health needs are our priority.  As part of our continuing mission to provide you with exceptional heart care, we have created designated Provider Care Teams.  These Care Teams include your primary Cardiologist (physician) and Advanced Practice Providers (APPs -  Physician Assistants and Nurse Practitioners) who all work together to provide you with the care you need, when you need it.  We recommend signing up for the patient portal called "MyChart".  Sign up information is provided on this After Visit Summary.  MyChart is used to connect with patients for Virtual Visits (Telemedicine).  Patients are able to view lab/test results, encounter notes, upcoming appointments, etc.  Non-urgent messages can be sent to your provider as well.   To learn more about what you can do with MyChart, go to [email or web address].    Your next appointment:   1 year(s)  Provider:   Rollene Rotunda, MD     Other Instructions

## 2023-05-08 DIAGNOSIS — E785 Hyperlipidemia, unspecified: Secondary | ICD-10-CM | POA: Diagnosis not present

## 2023-05-08 LAB — LIPID PANEL
Chol/HDL Ratio: 2.5 ratio (ref 0.0–4.4)
Cholesterol, Total: 205 mg/dL — ABNORMAL HIGH (ref 100–199)
HDL: 82 mg/dL (ref >39)
LDL Chol Calc (NIH): 113 mg/dL — ABNORMAL HIGH (ref 0–99)
Triglycerides: 55 mg/dL (ref 0–149)
VLDL Cholesterol Cal: 10 mg/dL (ref 5–40)

## 2023-05-14 MED ORDER — EZETIMIBE 10 MG PO TABS: 10.0000 mg | ORAL_TABLET | Freq: Every day | ORAL | 3 refills | Status: AC

## 2023-05-14 NOTE — Addendum Note (Signed)
 Addended by: Jeannette How A on: 05/14/2023 03:34 PM   Modules accepted: Orders

## 2023-05-16 ENCOUNTER — Ambulatory Visit: Payer: Self-pay | Attending: Physician Assistant | Admitting: Physician Assistant

## 2023-05-16 ENCOUNTER — Encounter: Payer: Self-pay | Admitting: Physical Medicine and Rehabilitation

## 2023-05-16 ENCOUNTER — Encounter: Payer: Self-pay | Admitting: Physician Assistant

## 2023-05-16 ENCOUNTER — Encounter (HOSPITAL_BASED_OUTPATIENT_CLINIC_OR_DEPARTMENT_OTHER): Payer: Self-pay

## 2023-05-16 ENCOUNTER — Ambulatory Visit: Payer: PPO | Admitting: Rheumatology

## 2023-05-16 VITALS — BP 156/92 | HR 68 | Resp 14 | Ht 61.5 in | Wt 157.0 lb

## 2023-05-16 DIAGNOSIS — E559 Vitamin D deficiency, unspecified: Secondary | ICD-10-CM | POA: Diagnosis not present

## 2023-05-16 DIAGNOSIS — Z79899 Other long term (current) drug therapy: Secondary | ICD-10-CM

## 2023-05-16 DIAGNOSIS — M47816 Spondylosis without myelopathy or radiculopathy, lumbar region: Secondary | ICD-10-CM

## 2023-05-16 DIAGNOSIS — M19072 Primary osteoarthritis, left ankle and foot: Secondary | ICD-10-CM

## 2023-05-16 DIAGNOSIS — M810 Age-related osteoporosis without current pathological fracture: Secondary | ICD-10-CM | POA: Diagnosis not present

## 2023-05-16 DIAGNOSIS — Z8719 Personal history of other diseases of the digestive system: Secondary | ICD-10-CM

## 2023-05-16 DIAGNOSIS — D126 Benign neoplasm of colon, unspecified: Secondary | ICD-10-CM

## 2023-05-16 DIAGNOSIS — Z8639 Personal history of other endocrine, nutritional and metabolic disease: Secondary | ICD-10-CM

## 2023-05-16 DIAGNOSIS — Z131 Encounter for screening for diabetes mellitus: Secondary | ICD-10-CM

## 2023-05-16 DIAGNOSIS — M353 Polymyalgia rheumatica: Secondary | ICD-10-CM

## 2023-05-16 DIAGNOSIS — M7061 Trochanteric bursitis, right hip: Secondary | ICD-10-CM

## 2023-05-16 DIAGNOSIS — M1611 Unilateral primary osteoarthritis, right hip: Secondary | ICD-10-CM

## 2023-05-16 DIAGNOSIS — E538 Deficiency of other specified B group vitamins: Secondary | ICD-10-CM

## 2023-05-16 DIAGNOSIS — M19041 Primary osteoarthritis, right hand: Secondary | ICD-10-CM

## 2023-05-16 DIAGNOSIS — Z8659 Personal history of other mental and behavioral disorders: Secondary | ICD-10-CM

## 2023-05-16 DIAGNOSIS — Z7952 Long term (current) use of systemic steroids: Secondary | ICD-10-CM

## 2023-05-16 DIAGNOSIS — Z5181 Encounter for therapeutic drug level monitoring: Secondary | ICD-10-CM

## 2023-05-16 DIAGNOSIS — Z96611 Presence of right artificial shoulder joint: Secondary | ICD-10-CM

## 2023-05-16 DIAGNOSIS — Z8781 Personal history of (healed) traumatic fracture: Secondary | ICD-10-CM | POA: Diagnosis not present

## 2023-05-16 DIAGNOSIS — M19042 Primary osteoarthritis, left hand: Secondary | ICD-10-CM

## 2023-05-16 DIAGNOSIS — M19071 Primary osteoarthritis, right ankle and foot: Secondary | ICD-10-CM

## 2023-05-16 DIAGNOSIS — I2510 Atherosclerotic heart disease of native coronary artery without angina pectoris: Secondary | ICD-10-CM

## 2023-05-16 NOTE — Addendum Note (Signed)
 Addended by: Ellen Henri on: 05/16/2023 04:29 PM   Modules accepted: Orders

## 2023-05-17 ENCOUNTER — Other Ambulatory Visit: Payer: Self-pay | Admitting: Pharmacist

## 2023-05-17 ENCOUNTER — Telehealth: Payer: Self-pay

## 2023-05-17 ENCOUNTER — Encounter: Payer: Self-pay | Admitting: Pharmacist

## 2023-05-17 ENCOUNTER — Encounter: Payer: Self-pay | Admitting: Orthopaedic Surgery

## 2023-05-17 LAB — CBC WITH DIFFERENTIAL/PLATELET
Absolute Lymphocytes: 1621 {cells}/uL (ref 850–3900)
Absolute Monocytes: 525 {cells}/uL (ref 200–950)
Basophils Absolute: 37 {cells}/uL (ref 0–200)
Basophils Relative: 0.5 %
Eosinophils Absolute: 207 {cells}/uL (ref 15–500)
Eosinophils Relative: 2.8 %
HCT: 40 % (ref 35.0–45.0)
Hemoglobin: 13.1 g/dL (ref 11.7–15.5)
MCH: 30 pg (ref 27.0–33.0)
MCHC: 32.8 g/dL (ref 32.0–36.0)
MCV: 91.7 fL (ref 80.0–100.0)
MPV: 11.4 fL (ref 7.5–12.5)
Monocytes Relative: 7.1 %
Neutro Abs: 5010 {cells}/uL (ref 1500–7800)
Neutrophils Relative %: 67.7 %
Platelets: 236 10*3/uL (ref 140–400)
RBC: 4.36 10*6/uL (ref 3.80–5.10)
RDW: 13 % (ref 11.0–15.0)
Total Lymphocyte: 21.9 %
WBC: 7.4 10*3/uL (ref 3.8–10.8)

## 2023-05-17 LAB — COMPREHENSIVE METABOLIC PANEL WITH GFR
AG Ratio: 2 (calc) (ref 1.0–2.5)
ALT: 9 U/L (ref 6–29)
AST: 15 U/L (ref 10–35)
Albumin: 3.9 g/dL (ref 3.6–5.1)
Alkaline phosphatase (APISO): 68 U/L (ref 37–153)
BUN: 9 mg/dL (ref 7–25)
CO2: 31 mmol/L (ref 20–32)
Calcium: 8.9 mg/dL (ref 8.6–10.4)
Chloride: 104 mmol/L (ref 98–110)
Creat: 0.8 mg/dL (ref 0.60–1.00)
Globulin: 2 g/dL (ref 1.9–3.7)
Glucose, Bld: 68 mg/dL (ref 65–99)
Potassium: 3.6 mmol/L (ref 3.5–5.3)
Sodium: 141 mmol/L (ref 135–146)
Total Bilirubin: 0.6 mg/dL (ref 0.2–1.2)
Total Protein: 5.9 g/dL — ABNORMAL LOW (ref 6.1–8.1)
eGFR: 78 mL/min/{1.73_m2} (ref >=60)

## 2023-05-17 LAB — HEMOGLOBIN A1C
Hgb A1c MFr Bld: 5.9 %{Hb} — ABNORMAL HIGH (ref <5.7)
Mean Plasma Glucose: 123 mg/dL
eAG (mmol/L): 6.8 mmol/L

## 2023-05-17 LAB — VITAMIN B12: Vitamin B-12: 363 pg/mL (ref 200–1100)

## 2023-05-17 LAB — VITAMIN D 25 HYDROXY (VIT D DEFICIENCY, FRACTURES): Vit D, 25-Hydroxy: 40 ng/mL (ref 30–100)

## 2023-05-17 NOTE — Telephone Encounter (Signed)
 Ladona Ridgel and [facility], patient will be scheduled as soon as possible.  Auth Submission: NO AUTH NEEDED Site of care: Site of care: [facility] Payer: BCBS medicare Medication & CPT/J Code(s) submitted: Reclast (Zolendronic acid) [id number] Route of submission (phone, fax, portal):  Phone # Fax # Auth type: Buy/Bill PB Units/visits requested: 5mg  x 1 dose Reference number:  Approval from: 05/17/23 to 02/12/24

## 2023-05-17 NOTE — Progress Notes (Signed)
 Total protein is slightly low but has improved.  Rest of CMP WNL.    Hgb 5.9%-improved since last year.    CBC WNL Vitamin D WNL Vitamin B12 improved to 363.  Please forward results to PCP as requested.

## 2023-05-17 NOTE — Progress Notes (Signed)
 Therapy plan placed for Reclast IV [phone]) for [facility] to start benefits investigation  Diagnosis: osteoporosis  Provider: Pollyann Savoy  Dose: 5mg  IV every 12 months  Last Clinic Visit: 05/16/2023 Next Clinic Visit: 11/19/2023  Pertinent Labs: 05/16/2023 - Vitamin D wnl, calcium wnl, renal function normal  Chesley Mires, PharmD, MPH, BCPS, CPP Clinical Pharmacist (Rheumatology and Pulmonology)

## 2023-05-22 ENCOUNTER — Encounter: Payer: Self-pay | Admitting: Family Medicine

## 2023-05-22 ENCOUNTER — Encounter: Payer: Self-pay | Admitting: Rheumatology

## 2023-05-22 NOTE — Progress Notes (Signed)
 Reclast scheduled for 05/28/23

## 2023-05-23 ENCOUNTER — Ambulatory Visit
Admission: RE | Admit: 2023-05-23 | Discharge: 2023-05-23 | Discharge status: Home / Self Care | Source: Ambulatory Visit | Attending: Orthopaedic Surgery | Admitting: Orthopaedic Surgery

## 2023-05-23 DIAGNOSIS — M5416 Radiculopathy, lumbar region: Secondary | ICD-10-CM

## 2023-05-23 DIAGNOSIS — M47816 Spondylosis without myelopathy or radiculopathy, lumbar region: Secondary | ICD-10-CM | POA: Diagnosis not present

## 2023-05-23 DIAGNOSIS — M4802 Spinal stenosis, cervical region: Secondary | ICD-10-CM | POA: Diagnosis not present

## 2023-05-27 ENCOUNTER — Encounter: Payer: Self-pay | Admitting: Family

## 2023-05-27 ENCOUNTER — Ambulatory Visit (INDEPENDENT_AMBULATORY_CARE_PROVIDER_SITE_OTHER): Admitting: Family

## 2023-05-27 ENCOUNTER — Ambulatory Visit (INDEPENDENT_AMBULATORY_CARE_PROVIDER_SITE_OTHER)

## 2023-05-27 VITALS — BP 142/82 | HR 62 | Temp 98.4°F | Ht 61.5 in | Wt 154.2 lb

## 2023-05-27 DIAGNOSIS — R0781 Pleurodynia: Secondary | ICD-10-CM

## 2023-05-27 DIAGNOSIS — J301 Allergic rhinitis due to pollen: Secondary | ICD-10-CM | POA: Diagnosis not present

## 2023-05-27 DIAGNOSIS — Z96611 Presence of right artificial shoulder joint: Secondary | ICD-10-CM | POA: Diagnosis not present

## 2023-05-27 DIAGNOSIS — J069 Acute upper respiratory infection, unspecified: Secondary | ICD-10-CM | POA: Diagnosis not present

## 2023-05-27 MED ORDER — PREDNISONE 20 MG PO TABS: ORAL_TABLET | ORAL | 0 refills | Status: DC

## 2023-05-27 MED ORDER — TRIAMCINOLONE ACETONIDE 55 MCG/ACT NA AERO: 1.0000 | INHALATION_SPRAY | Freq: Every day | NASAL | 2 refills | Status: DC

## 2023-05-27 NOTE — Progress Notes (Signed)
 Patient ID: [name], female    DOB: July 16, 1950, 73 y.o.   MRN: [id number]  Chief Complaint  Patient presents with   Fall    Pt c/o fall last Sunday.  Pt c/o rib pain.   Cough    Pt c/o cough and nasal/chest congestion for 3 days. Has tried mucinex which did help slightly.   Discussed the use of AI scribe software for clinical note transcription with the patient, who gave verbal consent to proceed.  History of Present Illness The patient presents with left-sided rib pain and a cough. The rib pain began after a fall while doing yard work, where she lost balance and fell against a can. The pain has been persistent since the fall, but has worsened with having the cough and congestion. The patient describes the pain as tender when pressed. She denies any other injuries from the fall, including to the hip or shoulder. She denies any SOB or inability to breathe well.  The cough began three days ago, prior to her fall. The cough is dry, but she reports nasal congestion and light yellow nasal mucus. She has been taking Mucinex, which has helped to break up the mucus. She denies any history of asthma or allergies.  The patient also reports difficulty sleeping due to the rib pain and cough. She has been sleeping in a recliner to alleviate some of the discomfort. She also has a history of arthritis and is on a daily low dose of prednisone. She has been advised to stop taking prednisone due to its negative impact on her osteoporosis, but she reports difficulty walking without it.  Assessment & Plan Rib contusion - Left-sided rib pain post-fall, worsened by cough. Differential includes rib fracture. Advised on continued conservative care if fx found on CXR.  Focus on symptom relief and further injury prevention due to continued coughing. - Use a pillow against ribs when coughing to counter the impact. - Use ice 20-30 minutes several times daily to reduce inflammation. - Alternate with heat to  improve circulation and healing. - Prescribe higher dose of prednisone for 5 days to suppress cough, then resume regular dose. - Take acetaminophen regularly for pain, up to 1g tid prn. - Avoid ibuprofen while on higher prednisone dose. - F/U with office for ongoing pain that is not improving after another week.  Upper respiratory infection - Cough and congestion likely viral, pt denies having any sinus/allergy sx prior to now. Discussed avoiding OTC Afrin d/t rebound congestion and Sudafed side effects. Recommended Nasacort as safer alternative which helps viral sx also, not just allergies. - Prescribe Nasacort nasal spray, one squirt each nostril twice daily for 2-3 days, then once daily. - Recommend over-the-counter generic Nyquil for nighttime cough and sleep aid. - Advise against Afrin spray due to rebound congestion. - Discuss Sudafed side effects: jitteriness, increased blood pressure.  Chronic prednisone use for arthritis Chronic low-dose prednisone for arthritis. Temporary dose increase for respiratory symptoms, then resume regular dosing. Tapering efforts due to osteoporosis concerns. - Prescribe higher dose of prednisone for 5 days, then resume regular dose.      Subjective:    Outpatient Medications Prior to Visit  Medication Sig Dispense Refill   amLODipine (NORVASC) 2.5 MG tablet Take 1 tablet (2.5 mg total) by mouth daily. 180 tablet 3   aspirin 81 MG EC tablet Take 81 mg by mouth at bedtime.     diazepam (VALIUM) 5 MG tablet Take one tablet one hour prior  to MRI. May repeat if needed. MUST HAVE DRIVER. 3 tablet 0   Evolocumab (REPATHA SURECLICK) 140 MG/ML SOAJ Inject 140 mg into the skin every 14 (fourteen) days. 6 mL 3   ezetimibe (ZETIA) 10 MG tablet Take 1 tablet (10 mg total) by mouth daily. 90 tablet 3   levothyroxine (SYNTHROID) 100 MCG tablet Take 1 tablet (100 mcg total) by mouth daily. 90 tablet 0   pantoprazole (PROTONIX) 40 MG tablet Take 1 tablet (40 mg total)  by mouth daily. 90 tablet 0   PARoxetine (PAXIL) 20 MG tablet Take 1 tablet (20 mg total) by mouth every morning. 90 tablet 2   predniSONE (DELTASONE) 5 MG tablet Take 1 tablet by mouth once daily with breakfast 90 tablet 0   traZODone (DESYREL) 50 MG tablet TAKE 1/2 TO 1 (ONE-HALF TO ONE) TABLET BY MOUTH AT BEDTIME AS NEEDED FOR SLEEP 30 tablet 0   Facility-Administered Medications Prior to Visit  Medication Dose Route Frequency Provider Last Rate Last Admin   0.9 %  sodium chloride infusion  500 mL Intravenous Once Armbruster, Lendon Queen, MD       Past Medical History:  Diagnosis Date   Anxiety    Arthritis    CAD (coronary artery disease)    Cataract    Depression    DISORDER, MENOPAUSAL NOS 03/20/2007   Qualifier: Diagnosis of  By: Larrie Po MD, Wilmon Hashimoto    GERD (gastroesophageal reflux disease)    Hyperlipidemia    Hypothyroidism    MYOCARDIAL INFARCTION, HX OF 09/09/2007   Qualifier: Diagnosis of  By: Penney Bowling MD, Bruce     Osteoporosis    Polymyalgia rheumatica [facility])    Past Surgical History:  Procedure Laterality Date   CATARACT EXTRACTION     CESAREAN SECTION     CHOLECYSTECTOMY N/A 11/24/2012   Procedure: LAPAROSCOPIC CHOLECYSTECTOMY;  Surgeon: Shela Derby, MD;  Location: [facility] OR;  Service: General;  Laterality: N/A;   COSMETIC SURGERY     EYE SURGERY     eye lids lifted   PTCA     REVERSE SHOULDER ARTHROPLASTY Right 08/12/2020   Procedure: REVERSE SHOULDER ARTHROPLASTY;  Surgeon: Janeth Medicus, MD;  Location: [facility] OR;  Service: Orthopedics;  Laterality: Right;   Allergies  Allergen Reactions   Morphine And Codeine Nausea And Vomiting   Prochlorperazine     REACTION: nerve reaction   Simvastatin     REACTION: leg cramps   Sulfa Antibiotics       Objective:    Physical Exam Vitals and nursing note reviewed.  Constitutional:      Appearance: Normal appearance. She is ill-appearing.     Interventions: Face mask in place.  HENT:     Right Ear: Tympanic  membrane and ear canal normal.     Left Ear: Tympanic membrane and ear canal normal.     Nose:     Right Sinus: Frontal sinus tenderness (pressure) present.     Left Sinus: Frontal sinus tenderness (pressure) present.     Mouth/Throat:     Mouth: Mucous membranes are moist.     Pharynx: No pharyngeal swelling, oropharyngeal exudate, posterior oropharyngeal erythema or uvula swelling.     Tonsils: No tonsillar exudate or tonsillar abscesses.  Cardiovascular:     Rate and Rhythm: Normal rate and regular rhythm.  Pulmonary:     Effort: Pulmonary effort is normal.     Breath sounds: Examination of the right-upper field reveals rhonchi. Examination of the left-upper field reveals  rhonchi. Rhonchi (mild) present.  Chest:     Chest wall: Tenderness (left upper rib cage) present. No swelling (no ecchymosis).  Musculoskeletal:        General: Normal range of motion.  Lymphadenopathy:     Head:     Right side of head: No preauricular or posterior auricular adenopathy.     Left side of head: No preauricular or posterior auricular adenopathy.     Cervical: No cervical adenopathy.  Skin:    General: Skin is warm and dry.  Neurological:     Mental Status: She is alert.  Psychiatric:        Mood and Affect: Mood normal.        Behavior: Behavior normal.    BP (!) 142/82 (BP Location: Left Arm, Patient Position: Sitting, Cuff Size: Large)   Pulse 62   Temp 98.4 F (36.9 C) (Temporal)   Ht 5' 1.5" (1.562 m)   Wt 154 lb 4 oz (70 kg)   SpO2 93%   BMI 28.67 kg/m  Wt Readings from Last 3 Encounters:  05/27/23 154 lb 4 oz (70 kg)  05/16/23 157 lb (71.2 kg)  05/02/23 159 lb (72.1 kg)     Versa Gore, NP

## 2023-05-28 ENCOUNTER — Ambulatory Visit

## 2023-05-28 ENCOUNTER — Encounter: Payer: Self-pay | Admitting: Family

## 2023-05-29 NOTE — Telephone Encounter (Signed)
 Noted.

## 2023-05-29 NOTE — Progress Notes (Signed)
 R/s Reclast to 06/06/23

## 2023-06-05 ENCOUNTER — Ambulatory Visit: Admitting: Orthopedic Surgery

## 2023-06-05 ENCOUNTER — Other Ambulatory Visit (INDEPENDENT_AMBULATORY_CARE_PROVIDER_SITE_OTHER): Payer: Self-pay

## 2023-06-05 VITALS — BP 135/81 | HR 71 | Ht 61.5 in | Wt 154.5 lb

## 2023-06-05 DIAGNOSIS — M5416 Radiculopathy, lumbar region: Secondary | ICD-10-CM

## 2023-06-05 DIAGNOSIS — G8929 Other chronic pain: Secondary | ICD-10-CM

## 2023-06-05 DIAGNOSIS — M545 Low back pain, unspecified: Secondary | ICD-10-CM

## 2023-06-05 NOTE — Progress Notes (Signed)
 Orthopedic Spine Surgery Office Note  Assessment: Patient is a 73 y.o. female with chronic upper lumbar back pain.  No radicular pain   Plan: -Explained that initially conservative treatment is tried as a significant number of patients may experience relief with these treatment modalities. Discussed that the conservative treatments include:  -activity modification  -physical therapy  -over the counter pain medications  -medrol  dosepak  -lumbar steroid injections -Patient has tried Salonpas, ibuprofen -Recommended continue with Salonpas and ibuprofen.  Recommended heat and daily stretching.  She should do light aerobic activity.  Encouraged for strengthening. -Her compression fracture is too far out to recommend kyphoplasty -Patient should return to office on an as-needed basis   Patient expressed understanding of the plan and all questions were answered to the patient's satisfaction.   ___________________________________________________________________________   History:  Patient is a 73 y.o. female who presents today for lumbar spine.  Patient has had several years of upper lumbar back pain.  She does not recall any trauma or injury that preceded the onset of pain.  She does not recall any injury that resulted in compression fracture.  She feels pain in the upper lumbar spine with no pain radiating to either lower extremity.  She notices the pain constantly.  She said it is worse though if she is standing or walking or doing anything upright for more than 30 minutes.  It does get better if she lays down.  She feels that the pain has gotten progressively worse with time.  She finds some temporary relief with Salonpas and ibuprofen.   Weakness: Denies Symptoms of imbalance: Denies Paresthesias and numbness: Denies Bowel or bladder incontinence: Denies Saddle anesthesia: Denies  Treatments tried: Ibuprofen, Salonpas  Review of systems: Denies fevers and chills, night sweats,  unexplained weight loss, history of cancer.  Has had pain that wakes her at night  Past medical history: CAD HLD HTN History of MI GERD Osteoporosis  Allergies: sulfa, morphine , simvastatin , prochlorperazine  Past surgical history:  Right shoulder arthroplasty Cholecystectomy Cataract surgery  Social history: Denies use of nicotine product (smoking, vaping, patches, smokeless) Alcohol use: Denies Denies recreational drug use   Physical Exam:  BMI of 28.7  General: no acute distress, appears stated age Neurologic: alert, answering questions appropriately, following commands Respiratory: unlabored breathing on room air, symmetric chest rise Psychiatric: appropriate affect, normal cadence to speech   MSK (spine):  -Strength exam      Left  Right EHL    5/5  5/5 TA    5/5  5/5 GSC    5/5  5/5 Knee extension  5/5  5/5 Hip flexion   5/5  5/5  -Sensory exam    Sensation intact to light touch in L3-S1 nerve distributions of bilateral lower extremities  -Left hip exam: No pain through range of motion -Right hip exam: No pain through range of motion  Imaging: XRs of the lumbar spine from 02/25/2023 and 06/05/2023 were independently reviewed and interpreted, showing L1 chronic compression fracture with focal kyphosis at the site of the fracture.  There is anterior height loss of the fracture.  No other fractures seen.  Lumbar scoliotic curvature with apex to the left at L2 that appears well-balanced coronally.  Bilateral degenerative changes noted within the hips.  No evidence of instability on flexion/extension views.  MRI of the lumbar spine from 05/23/2023 was independently reviewed and interpreted, showing no significant degenerative disc disease.  Chronic appearing L1 compression fracture with anterior height loss.  No retropulsion.  No significant central, lateral recess, or foraminal stenosis.   Patient name: [name] Patient MRN: [id number] Date of visit:  06/05/23

## 2023-06-06 ENCOUNTER — Ambulatory Visit (INDEPENDENT_AMBULATORY_CARE_PROVIDER_SITE_OTHER)

## 2023-06-06 VITALS — BP 138/83 | HR 67 | Temp 98.2°F | Resp 16 | Ht 61.0 in | Wt 154.0 lb

## 2023-06-06 DIAGNOSIS — M810 Age-related osteoporosis without current pathological fracture: Secondary | ICD-10-CM

## 2023-06-06 MED ORDER — ZOLEDRONIC ACID 5 MG/100ML IV SOLN
5.0000 mg | Freq: Once | INTRAVENOUS | Status: AC
Start: 2023-06-06 — End: 2023-06-06
  Administered 2023-06-06: 5 mg via INTRAVENOUS
  Filled 2023-06-06: qty 100

## 2023-06-06 MED ORDER — ACETAMINOPHEN 325 MG PO TABS: 650.0000 mg | ORAL_TABLET | Freq: Once | ORAL | Status: DC

## 2023-06-06 MED ORDER — DIPHENHYDRAMINE HCL 25 MG PO CAPS: 25.0000 mg | ORAL_CAPSULE | Freq: Once | ORAL | Status: DC

## 2023-06-06 NOTE — Progress Notes (Signed)
 Diagnosis: Osteoporosis  Provider:  Phyllis Breeze MD  Procedure: IV Infusion  IV Type: Peripheral, IV Location: L Antecubital  Reclast  (Zolendronic Acid), Dose: 5 mg  Infusion Start Time: 1313  Infusion Stop Time: 1345  Post Infusion IV Care: 15 mins Observation period completed per patient request  Discharge: Condition: Good, Destination: Home . AVS Provided  Performed by:  Natividad Balding, RN

## 2023-06-12 DIAGNOSIS — K080 Exfoliation of teeth due to systemic causes: Secondary | ICD-10-CM | POA: Diagnosis not present

## 2023-06-15 ENCOUNTER — Other Ambulatory Visit: Payer: Self-pay | Admitting: Family Medicine

## 2023-06-15 DIAGNOSIS — E034 Atrophy of thyroid (acquired): Secondary | ICD-10-CM

## 2023-06-17 ENCOUNTER — Other Ambulatory Visit: Payer: Self-pay | Admitting: Family Medicine

## 2023-06-17 DIAGNOSIS — E034 Atrophy of thyroid (acquired): Secondary | ICD-10-CM

## 2023-06-17 MED ORDER — LEVOTHYROXINE SODIUM 100 MCG PO TABS: 100.0000 ug | ORAL_TABLET | Freq: Every day | ORAL | 0 refills | Status: DC

## 2023-07-08 ENCOUNTER — Encounter: Payer: Self-pay | Admitting: Family Medicine

## 2023-07-09 ENCOUNTER — Other Ambulatory Visit: Payer: Self-pay

## 2023-07-09 MED ORDER — PANTOPRAZOLE SODIUM 40 MG PO TBEC
40.0000 mg | DELAYED_RELEASE_TABLET | Freq: Every day | ORAL | 3 refills | Status: AC
Start: 2023-07-09 — End: ?

## 2023-08-03 ENCOUNTER — Encounter: Payer: Self-pay | Admitting: Rheumatology

## 2023-08-06 DIAGNOSIS — M47819 Spondylosis without myelopathy or radiculopathy, site unspecified: Secondary | ICD-10-CM | POA: Diagnosis not present

## 2023-08-09 DIAGNOSIS — M8588 Other specified disorders of bone density and structure, other site: Secondary | ICD-10-CM | POA: Diagnosis not present

## 2023-08-09 DIAGNOSIS — Z7952 Long term (current) use of systemic steroids: Secondary | ICD-10-CM | POA: Diagnosis not present

## 2023-08-09 DIAGNOSIS — M069 Rheumatoid arthritis, unspecified: Secondary | ICD-10-CM | POA: Diagnosis not present

## 2023-08-09 LAB — HM DEXA SCAN

## 2023-08-11 ENCOUNTER — Encounter: Payer: Self-pay | Admitting: Cardiology

## 2023-08-12 ENCOUNTER — Other Ambulatory Visit: Payer: Self-pay

## 2023-08-12 ENCOUNTER — Other Ambulatory Visit: Payer: Self-pay | Admitting: Physician Assistant

## 2023-08-12 DIAGNOSIS — E785 Hyperlipidemia, unspecified: Secondary | ICD-10-CM

## 2023-08-12 NOTE — Telephone Encounter (Signed)
 Last Fill: 03/04/2023  Next Visit: 11/19/2023  Last Visit: 05/16/2023  Dx: Polymyalgia rheumatica   Current Dose per office note on 05/16/2023: prednisone  5 mg daily and has been unable to taper.   Okay to refill Prednisone ?

## 2023-08-12 NOTE — Telephone Encounter (Signed)
 Patient confirmed her last A1C was in April

## 2023-08-12 NOTE — Telephone Encounter (Signed)
 Please check when the patient's last hgb A1c was checked?

## 2023-08-12 NOTE — Telephone Encounter (Signed)
 You checked one on 05/16/2023 and it was 5.9

## 2023-08-13 ENCOUNTER — Encounter: Payer: Self-pay | Admitting: Rheumatology

## 2023-08-13 DIAGNOSIS — Z1231 Encounter for screening mammogram for malignant neoplasm of breast: Secondary | ICD-10-CM | POA: Diagnosis not present

## 2023-08-13 LAB — HM MAMMOGRAPHY

## 2023-08-14 ENCOUNTER — Telehealth: Payer: Self-pay

## 2023-08-14 DIAGNOSIS — E785 Hyperlipidemia, unspecified: Secondary | ICD-10-CM | POA: Diagnosis not present

## 2023-08-14 LAB — LIPID PANEL
Chol/HDL Ratio: 2.4 ratio (ref 0.0–4.4)
Cholesterol, Total: 189 mg/dL (ref 100–199)
HDL: 79 mg/dL (ref >39)
LDL Chol Calc (NIH): 99 mg/dL (ref 0–99)
Triglycerides: 58 mg/dL (ref 0–149)
VLDL Cholesterol Cal: 11 mg/dL (ref 5–40)

## 2023-08-14 NOTE — Telephone Encounter (Signed)
 Received DEXA results from [facility].  Date of Scan: 08/09/2023  Lowest T-score:-2.3  BMD:0.667  Lowest site measured:Left Femoral Neck  DX: Osteopenia  Significant changes in BMD and site measured (5% and above):N/A  Current Regimen: Reclast  06/06/2023, Calcium , Vitamin D   Recommendation:Continue calcium , Vitamin D ,and resistive exercises   Reviewed by:Waddell Craze  Next Appointment: 11/19/2022   [facility] for patient to contact the office for DEXA results

## 2023-08-15 ENCOUNTER — Ambulatory Visit: Payer: Self-pay | Admitting: Cardiology

## 2023-08-15 NOTE — Telephone Encounter (Signed)
 Attempted to contact patient, [facility] for patient to call the office.

## 2023-08-15 NOTE — Telephone Encounter (Signed)
 Attempted to contact the patient and left message for patient to call the office.

## 2023-08-15 NOTE — Telephone Encounter (Signed)
 Patient advised of bone density scan results. Patient advised recommendations are to continue calcium , Vitamin D ,and resistive exercises. Patient expressed understanding.

## 2023-08-20 ENCOUNTER — Encounter: Payer: Self-pay | Admitting: Rheumatology

## 2023-08-21 ENCOUNTER — Ambulatory Visit

## 2023-08-21 ENCOUNTER — Ambulatory Visit: Attending: Rheumatology | Admitting: Rheumatology

## 2023-08-21 ENCOUNTER — Encounter: Payer: Self-pay | Admitting: Rheumatology

## 2023-08-21 VITALS — BP 130/82 | HR 74 | Resp 16 | Ht 61.5 in | Wt 155.8 lb

## 2023-08-21 DIAGNOSIS — M1611 Unilateral primary osteoarthritis, right hip: Secondary | ICD-10-CM

## 2023-08-21 DIAGNOSIS — E538 Deficiency of other specified B group vitamins: Secondary | ICD-10-CM

## 2023-08-21 DIAGNOSIS — M47816 Spondylosis without myelopathy or radiculopathy, lumbar region: Secondary | ICD-10-CM

## 2023-08-21 DIAGNOSIS — Z8781 Personal history of (healed) traumatic fracture: Secondary | ICD-10-CM

## 2023-08-21 DIAGNOSIS — M19041 Primary osteoarthritis, right hand: Secondary | ICD-10-CM

## 2023-08-21 DIAGNOSIS — M19042 Primary osteoarthritis, left hand: Secondary | ICD-10-CM

## 2023-08-21 DIAGNOSIS — D126 Benign neoplasm of colon, unspecified: Secondary | ICD-10-CM

## 2023-08-21 DIAGNOSIS — M25552 Pain in left hip: Secondary | ICD-10-CM

## 2023-08-21 DIAGNOSIS — Z96611 Presence of right artificial shoulder joint: Secondary | ICD-10-CM

## 2023-08-21 DIAGNOSIS — Z8659 Personal history of other mental and behavioral disorders: Secondary | ICD-10-CM

## 2023-08-21 DIAGNOSIS — Z5181 Encounter for therapeutic drug level monitoring: Secondary | ICD-10-CM | POA: Diagnosis not present

## 2023-08-21 DIAGNOSIS — Z8719 Personal history of other diseases of the digestive system: Secondary | ICD-10-CM

## 2023-08-21 DIAGNOSIS — M810 Age-related osteoporosis without current pathological fracture: Secondary | ICD-10-CM

## 2023-08-21 DIAGNOSIS — Z7952 Long term (current) use of systemic steroids: Secondary | ICD-10-CM

## 2023-08-21 DIAGNOSIS — M353 Polymyalgia rheumatica: Secondary | ICD-10-CM

## 2023-08-21 DIAGNOSIS — E559 Vitamin D deficiency, unspecified: Secondary | ICD-10-CM

## 2023-08-21 DIAGNOSIS — I2510 Atherosclerotic heart disease of native coronary artery without angina pectoris: Secondary | ICD-10-CM

## 2023-08-21 DIAGNOSIS — M19072 Primary osteoarthritis, left ankle and foot: Secondary | ICD-10-CM

## 2023-08-21 DIAGNOSIS — Z8639 Personal history of other endocrine, nutritional and metabolic disease: Secondary | ICD-10-CM

## 2023-08-21 DIAGNOSIS — M19071 Primary osteoarthritis, right ankle and foot: Secondary | ICD-10-CM

## 2023-08-21 MED ORDER — TRIAMCINOLONE ACETONIDE 40 MG/ML IJ SUSP
40.0000 mg | INTRAMUSCULAR | Status: AC | PRN
  Administered 2023-08-21: 40 mg via INTRA_ARTICULAR

## 2023-08-21 MED ORDER — LIDOCAINE HCL 1 % IJ SOLN
1.5000 mL | INTRAMUSCULAR | Status: AC | PRN
  Administered 2023-08-21: 1.5 mL

## 2023-08-21 NOTE — Progress Notes (Signed)
 Office Visit Note  Patient: [name]             Date of Birth: 11-Sep-1950           MRN: [id number]             PCP: Katrinka Garnette KIDD, MD Referring: Katrinka Garnette KIDD, MD Visit Date: 08/21/2023 Occupation: @GUAROCC @  Subjective:  Left Hip pain   History of Present Illness: [name] is a 74 y.o. female with osteoarthritis and osteoporosis.  She returns today after her last visit on May 16, 2023.  She states she has been having pain and discomfort in her left hip for the last several weeks which has been worse over the last few days.  Experiencing nocturnal pain and difficulty sleeping on the side.  She is having difficulty walking due to discomfort.  She also continues to have some lower back pain which is localized to her back.  She has been getting IV Reclast  for osteoporosis.  Her last IV Reclast  was on June 06, 2023.    Activities of Daily Living:  Patient reports morning stiffness for 1 hour.   Patient Reports nocturnal pain.  Difficulty dressing/grooming: Denies Difficulty climbing stairs: Reports Difficulty getting out of chair: Reports Difficulty using hands for taps, buttons, cutlery, and/or writing: Denies  Review of Systems  Constitutional:  Negative for fatigue.  HENT:  Negative for mouth sores and mouth dryness.   Eyes:  Negative for dryness.  Respiratory:  Negative for shortness of breath.   Cardiovascular:  Negative for chest pain and palpitations.  Gastrointestinal:  Negative for blood in stool, constipation and diarrhea.  Endocrine: Negative for increased urination.  Genitourinary:  Negative for involuntary urination.  Musculoskeletal:  Positive for joint pain, joint pain, joint swelling and morning stiffness. Negative for gait problem, myalgias, muscle weakness, muscle tenderness and myalgias.  Skin:  Negative for color change, rash, hair loss and sensitivity to sunlight.  Allergic/Immunologic: Negative for susceptible to infections.   Neurological:  Negative for dizziness and headaches.  Hematological:  Negative for swollen glands.  Psychiatric/Behavioral:  Positive for sleep disturbance. Negative for depressed mood. The patient is not nervous/anxious.     PMFS History:  Patient Active Problem List   Diagnosis Date Noted   Essential hypertension 05/01/2023   B12 deficiency 04/26/2022   Hyperglycemia 04/25/2022   Unilateral primary osteoarthritis, left hip 08/24/2021   Shoulder fracture 08/10/2020   Lumbar compression fracture (HCC) 05/11/2019   Dyslipidemia 01/26/2019   Drug-induced myopathy 01/14/2019   Haglund's deformity 01/12/2019   High risk medication use 12/04/2018   Lower esophageal ring ([place]) 03/03/2018   Osteoarthritis of right hip 12/02/2017   GERD (gastroesophageal reflux disease) 12/15/2015   Polyp of colon, adenomatous 10/11/2014   Vitamin D  deficiency 05/26/2014   Osteoporosis 05/26/2014   Polymyalgia rheumatica (HCC) 12/01/2010   CAD (coronary artery disease) 09/09/2007   Hematuria 09/09/2007   Depression 03/20/2007   Hypothyroid 02/13/1999    Past Medical History:  Diagnosis Date   Anxiety    Arthritis    CAD (coronary artery disease)    Cataract    Depression    DISORDER, MENOPAUSAL NOS 03/20/2007   Qualifier: Diagnosis of  By: Mavis MD, Norleen BRAVO    GERD (gastroesophageal reflux disease)    Hyperlipidemia    Hypothyroidism    MYOCARDIAL INFARCTION, HX OF 09/09/2007   Qualifier: Diagnosis of  By: Tammie MD, Bruce     Osteoporosis    Polymyalgia rheumatica (  HCC)     Family History  Problem Relation Age of Onset   Hypertension Father    Heart disease Father        Mi age 36, 33, 75 and 21.   Cancer Father    Hyperlipidemia Father    Colon cancer Maternal Uncle    Hypertension Mother    Cancer Mother    Hyperlipidemia Mother    Pulmonary fibrosis Sister    Epilepsy Son    Healthy Son    Myasthenia gravis Son    Healthy Son    Rheum arthritis Niece    Lupus Niece     Rheum arthritis Niece    Past Surgical History:  Procedure Laterality Date   CATARACT EXTRACTION     CESAREAN SECTION     CHOLECYSTECTOMY N/A 11/24/2012   Procedure: LAPAROSCOPIC CHOLECYSTECTOMY;  Surgeon: Lynda Leos, MD;  Location: [facility] OR;  Service: General;  Laterality: N/A;   COSMETIC SURGERY     EYE SURGERY     eye lids lifted   PTCA     REVERSE SHOULDER ARTHROPLASTY Right 08/12/2020   Procedure: REVERSE SHOULDER ARTHROPLASTY;  Surgeon: Sharl Selinda Dover, MD;  Location: [facility] OR;  Service: Orthopedics;  Laterality: Right;   Social History   Social History Narrative   Husband and 2 adult children live at home. No grandkids. Son with epilepsy.      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     Immunization History  Administered Date(s) Administered   Fluad Quad(high Dose 65+) 11/10/2019, 11/21/2020, 11/28/2021   Influenza Whole 11/09/2008   Influenza, High Dose Seasonal PF 10/30/2016, 12/02/2017, 11/08/2018, 10/26/2022   Influenza-Unspecified 11/08/2018   Moderna Covid-19 Fall Seasonal Vaccine 31yrs & older 10/26/2022   PFIZER Comirnaty(Gray Top)Covid-19 Tri-Sucrose Vaccine 11/16/2021   PFIZER(Purple Top)SARS-COV-2 Vaccination 02/26/2019, 03/20/2019, 11/28/2019   PNEUMOCOCCAL CONJUGATE-20 06/21/2020   Pfizer Covid-19 Vaccine Bivalent Booster 35yrs & up 11/21/2020, 07/22/2021   Pneumococcal Conjugate-13 10/11/2014   Pneumococcal Polysaccharide-23 12/15/2015   Rsv, Bivalent, Protein Subunit Rsvpref,pf [place]) 11/16/2021   Td 02/12/2006   Td (Adult), 2 Lf Tetanus Toxid, Preservative Free 02/12/2006   Tdap 04/26/2017   Zoster Recombinant(Shingrix) 07/18/2021, 09/19/2021   Zoster, Live 09/19/2010     Objective: Vital Signs: BP 130/82 (BP Location: Left Arm, Patient Position: Sitting, Cuff Size: Normal)   Pulse 74   Resp 16   Ht 5' 1.5 (1.562 m)   Wt 155 lb 12.8 oz (70.7 kg)   BMI 28.96 kg/m    Physical Exam Vitals and nursing note reviewed.  Constitutional:       Appearance: She is well-developed.  HENT:     Head: Normocephalic and atraumatic.  Eyes:     Conjunctiva/sclera: Conjunctivae normal.  Cardiovascular:     Rate and Rhythm: Normal rate and regular rhythm.     Heart sounds: Normal heart sounds.  Pulmonary:     Effort: Pulmonary effort is normal.     Breath sounds: Normal breath sounds.  Abdominal:     General: Bowel sounds are normal.     Palpations: Abdomen is soft.  Musculoskeletal:     Cervical back: Normal range of motion.  Lymphadenopathy:     Cervical: No cervical adenopathy.  Skin:    General: Skin is warm and dry.     Capillary Refill: Capillary refill takes less than 2 seconds.  Neurological:     Mental Status: She is alert and oriented to person, place, and time.  Psychiatric:  Behavior: Behavior normal.      Musculoskeletal Exam: She had limited range of motion of the cervical spine without discomfort.  Thoracic kyphosis without tenderness was noted.  She had painful range of motion of lumbar spine.  Right shoulder and abduction was very limited.  Left shoulder was full range of motion.  Elbow joints and wrist joints in good range of motion.  She had bilateral PIP and DIP thickening with no synovitis.  She had limited range of motion of her right hip joint without discomfort.  Left hip joint had very limited range of motion with discomfort.  She also had tenderness over her left trochanteric region.  Knee joints in good range of motion.  There was no tenderness over ankles or MTPs.  CDAI Exam: CDAI Score: -- Patient Global: --; Provider Global: -- Swollen: --; Tender: -- Joint Exam 08/21/2023   No joint exam has been documented for this visit   There is currently no information documented on the homunculus. Go to the Rheumatology activity and complete the homunculus joint exam.  Investigation: No additional findings.  Imaging: No results found.  Recent Labs: Lab Results  Component Value Date   WBC  7.4 05/16/2023   HGB 13.1 05/16/2023   PLT 236 05/16/2023   NA 141 05/16/2023   K 3.6 05/16/2023   CL 104 05/16/2023   CO2 31 05/16/2023   GLUCOSE 68 05/16/2023   BUN 9 05/16/2023   CREATININE 0.80 05/16/2023   BILITOT 0.6 05/16/2023   ALKPHOS 79 05/15/2022   AST 15 05/16/2023   ALT 9 05/16/2023   PROT 5.9 (L) 05/16/2023   ALBUMIN 2.3 (L) 05/15/2022   CALCIUM  8.9 05/16/2023   GFRAA 75 04/22/2020   QFTBGOLDPLUS NEGATIVE 08/12/2018    Speciality Comments: Forteo  October 2021-February 2022 restarted June 2022 (missed 3 weeks due to fracture)  Should be completing Forteo  in December 2023 Reclast  May 07, 2022, June 06, 2023  Procedures:  Large Joint Inj: L greater trochanter on 08/21/2023 4:39 PM Indications: pain Details: 27 G 1.5 in needle, lateral approach  Arthrogram: No  Medications: 1.5 mL lidocaine  1 %; 40 mg triamcinolone  acetonide 40 MG/ML Aspirate: 0 mL Outcome: tolerated well, no immediate complications  Risk of infection, tendon injury, nerve injury, hypopigmentation and dermal atrophy were discussed. Consent was given by the patient. Immediately prior to procedure a time out was called to verify the correct patient, procedure, equipment, support staff and site/side marked as required. Patient was prepped and draped in the usual sterile fashion.     Allergies: Morphine  and codeine, Prochlorperazine, Simvastatin , and Sulfa antibiotics   Assessment / Plan:     Visit Diagnoses: Age-related osteoporosis without current pathological fracture - DEXA 08/07/2021:Bilateral FN BMD 0.619 T-score -2.1. L1 compression fracture.  Long-term systemic prednisone  use-prednisone  5 mg daily currently.  Repeat DEXA scan is pending.Forteo  October 2021-February 2022 restarted June 2022 (missed 3 weeks due to fracture) and completed January 2024.  IV Reclast  was given on May 07, 2022 and June 06, 2023.  She has been taking calcium  and vitamin D .  Will reach out to the patient once the  DEXA scan results are available.  Medication monitoring encounter - IV reclast  started on 05/07/22.  She has second dose of IV Reclast  on June 06, 2023 which she tolerated well without any side effects.  History of compression fracture of spine - Previous L1 compression fracture with 60% vertebral body height loss.  Vitamin D  deficiency-vitamin D  was normal at 40 on  May 16, 2023.  Polymyalgia rheumatica (HCC) - DXd 2015 by another rheumatologist.  Patient remains on prednisone  5 mg daily and has been unable to taper.  No muscular weakness or tenderness was noted.  Long term (current) use of systemic steroids - He is on 5 mg p.o. daily.  Hemoglobin A1c 6.0 on April 25, 2022.  Methotrexate  discontinued as no benefit was noted.  S/P shoulder replacement, right - Limited range of motion and chronic discomfort.  Primary osteoarthritis of both hands-joint protection muscle strengthening was discussed.  Pain in left hip -she has been having pain and discomfort in her left hip.  She had limited range of motion.  X-ray of the left hip joint were obtained to evaluate the joint pathology.  X-rays of the left femur were also obtained to rule out atypical fracture.  Plan: XR HIP UNILAT W OR W/O PELVIS 2-3 VIEWS LEFT, XR FEMUR MIN 2 VIEWS LEFT.  X-rays of the hip joint and SI joint were unremarkable.  X-ray of the femur was unremarkable.  She had tenderness over the left trochanteric bursa.  After informed consent was obtained and side effects were discussed left trochanteric bursa was injected with lidocaine  and Kenalog  as described above.  Patient tolerated the procedure well.  Postprocedure instructions were given.  I also advised her to follow-up with Dr. Georgina if her lower back symptoms persist.  Primary osteoarthritis of right hip-she had limited range of motion without discomfort.  Spondylosis of lumbar spine-she continues to have some lower back pain.  Patient was advised to follow-up with Dr.  Georgina.  Primary osteoarthritis of both feet-proper fitting shoes were advised.  Other medical problems are listed as follows:  Coronary artery disease involving native coronary artery of native heart without angina pectoris  Vitamin B12 deficiency  History of hypothyroidism  History of depression  Adenomatous polyp of colon, unspecified part of colon  History of gastroesophageal reflux (GERD)  Orders: Orders Placed This Encounter  Procedures   XR HIP UNILAT W OR W/O PELVIS 2-3 VIEWS LEFT   XR FEMUR MIN 2 VIEWS LEFT   No orders of the defined types were placed in this encounter.    Follow-Up Instructions: Return in about 3 months (around 11/21/2023) for Osteoarthritis, Osteoporosis.   Maya Nash, MD  Note - This record has been created using Animal nutritionist.  Chart creation errors have been sought, but may not always  have been located. Such creation errors do not reflect on  the standard of medical care.

## 2023-08-26 ENCOUNTER — Encounter: Payer: Self-pay | Admitting: Physician Assistant

## 2023-09-16 ENCOUNTER — Encounter: Payer: Self-pay | Admitting: Cardiology

## 2023-09-16 DIAGNOSIS — E785 Hyperlipidemia, unspecified: Secondary | ICD-10-CM

## 2023-09-20 ENCOUNTER — Encounter: Payer: Self-pay | Admitting: Cardiology

## 2023-09-22 ENCOUNTER — Encounter: Payer: Self-pay | Admitting: Cardiology

## 2023-09-22 ENCOUNTER — Encounter: Payer: Self-pay | Admitting: Family Medicine

## 2023-09-23 ENCOUNTER — Telehealth: Payer: Self-pay | Admitting: Cardiology

## 2023-09-23 ENCOUNTER — Other Ambulatory Visit: Payer: Self-pay | Admitting: Family Medicine

## 2023-09-23 MED ORDER — AMLODIPINE BESYLATE 5 MG PO TABS: 5.0000 mg | ORAL_TABLET | Freq: Every day | ORAL | 3 refills | Status: DC

## 2023-09-23 NOTE — Telephone Encounter (Signed)
 Pt c/o BP issue: STAT if pt c/o blurred vision, one-sided weakness or slurred speech.   1. What is your BP concern?  BP is elevated.  2. Have you taken any BP medication today? Yes  3. What are your last 5 BP readings? 8/10: 185/100 8/11: 159/90  4. Are you having any other symptoms (ex. Dizziness, headache, blurred vision, passed out)?  No

## 2023-09-23 NOTE — Telephone Encounter (Signed)
 Please see patient message regarding blood pressure

## 2023-09-23 NOTE — Telephone Encounter (Signed)
 Returned patient's call regarding concerns about blood pressure.   Patient reports that at her last visit with Dr. Lavona 05/02/23 he advised her to track her BP and started her on 2.5 mg amlodipine  daily. She states she had not been tracking her BP but recently her friend had a stroke so she started tracking it.   She denies any symptoms (leg swelling, SOB, chest pain, dizziness, blurry vision, weakness, headache) but gives the following readings:  09/22/23 (afternoon): 185/100, 175/100, 165/90  09/23/23 (8:30 AM): 159/90  Patient reports she takes her amlodipine  at night. Advised patient to take her amlodipine  for today now and check her BP in 2 hours, writing down her readings. Explained that blood pressure tends to be lower at night and the earlier timing of the medication may help to keep her blood pressure readings consistently lower. Patient is asking if she needs a higher dose of amlodipine , advised that I forward her concerns on to Dr. Lavona. Also made an appt with APP for BP follow up on 10/07/23.

## 2023-09-23 NOTE — Telephone Encounter (Signed)
 Spoke with pt regarding increasing her medication. Pt agreeable. Prescription sent to pt pharmacy of choice. Pt to keep bp diary. Pt verbalized understanding. All questions if any were answered.

## 2023-09-23 NOTE — Addendum Note (Signed)
 Addended by: Micahel Omlor N on: 09/23/2023 02:50 PM   Modules accepted: Orders

## 2023-10-04 NOTE — Progress Notes (Unsigned)
 Cardiology Clinic Note   Patient Name: [name] Date of Encounter: 10/07/2023  Primary Care Provider:  Katrinka Garnette KIDD, MD Primary Cardiologist:  Lynwood Schilling, MD  Patient Profile    [name] 73 year old female presents to the clinic today for evaluation of her elevated blood pressure.  Past Medical History    Past Medical History:  Diagnosis Date   Anxiety    Arthritis    CAD (coronary artery disease)    Cataract    Depression    DISORDER, MENOPAUSAL NOS 03/20/2007   Qualifier: Diagnosis of  By: Mavis MD, Norleen BRAVO    GERD (gastroesophageal reflux disease)    Hyperlipidemia    Hypothyroidism    MYOCARDIAL INFARCTION, HX OF 09/09/2007   Qualifier: Diagnosis of  By: Tammie MD, Bruce     Osteoporosis    Polymyalgia rheumatica [facility])    Past Surgical History:  Procedure Laterality Date   CATARACT EXTRACTION     CESAREAN SECTION     CHOLECYSTECTOMY N/A 11/24/2012   Procedure: LAPAROSCOPIC CHOLECYSTECTOMY;  Surgeon: Lynda Leos, MD;  Location: [facility] OR;  Service: General;  Laterality: N/A;   COSMETIC SURGERY     EYE SURGERY     eye lids lifted   PTCA     REVERSE SHOULDER ARTHROPLASTY Right 08/12/2020   Procedure: REVERSE SHOULDER ARTHROPLASTY;  Surgeon: Sharl Selinda Dover, MD;  Location: [facility] OR;  Service: Orthopedics;  Laterality: Right;    Allergies  Allergies  Allergen Reactions   Morphine  And Codeine Nausea And Vomiting   Prochlorperazine     REACTION: nerve reaction   Simvastatin      REACTION: leg cramps   Sulfa Antibiotics     History of Present Illness    [name] has a PMH of coronary artery disease, HLD, HTN, and back pain.  She previously had marginal branch MI in 2009.  She was noted to have 99% stenosis which was reduced to 50% stenosis with dissection and TIMI-3 flow.  Medical management was recommended.  She was seen in follow-up by Dr. Schilling on 05/02/2023.  During that time she was doing okay.  She was limited in her  physical activity due to back pain.  She noted she may have procedure for this.  With her limited physical activity she denied cardiac symptoms.  She denied chest discomfort, arm, and neck discomfort.  She denied shortness of breath, orthopnea and PND.  She denied palpitations, presyncope and syncope.  She was started on amlodipine  2.5 mg daily and ezetimibe  10 mg daily.  She presents to the clinic today for follow-up evaluation and evaluation of her elevated blood pressure.  She states she has been monitoring her blood pressure at home.  She contacted the clinic and was instructed to increase her amlodipine  to 5 mg daily.  A home health nurse checked her blood pressure and noted 120s systolic.  In clinic today her blood pressure is 127/89.  It appears that her home blood pressure cuff is running about 20-30 points higher systolic.  I have asked her to check this cuff with a different blood pressure monitor.  She expressed understanding.  We also reviewed secondary causes of hypertension.  She does note that she occasionally has chest discomfort which she attributes to stress.  She worries about her disabled son.  She reports that she does occasionally take ibuprofen for arthritic type pain.  I will continue Norvasc  5 mg daily and plan follow-up in 6 months..  Today  she denies chest pain, shortness of breath, lower extremity edema, fatigue, palpitations, melena, hematuria, hemoptysis, diaphoresis, weakness, presyncope, syncope, orthopnea, and PND.    Home Medications    Prior to Admission medications   Medication Sig Start Date End Date Taking? Authorizing Provider  amLODipine  (NORVASC ) 5 MG tablet Take 1 tablet (5 mg total) by mouth daily. 09/23/23   Lavona Agent, MD  aspirin  81 MG EC tablet Take 81 mg by mouth at bedtime.    [provider]  Evolocumab  (REPATHA  SURECLICK) 140 MG/ML SOAJ Inject 140 mg into the skin every 14 (fourteen) days. 05/04/22   Lavona Agent, MD  ezetimibe  (ZETIA )  10 MG tablet Take 1 tablet (10 mg total) by mouth daily. 05/14/23 08/21/23  Lavona Agent, MD  levothyroxine  (SYNTHROID ) 100 MCG tablet Take 1 tablet (100 mcg total) by mouth daily. 06/17/23   Katrinka Garnette KIDD, MD  pantoprazole  (PROTONIX ) 40 MG tablet Take 1 tablet (40 mg total) by mouth daily. 07/09/23   Katrinka Garnette KIDD, MD  PARoxetine  (PAXIL ) 20 MG tablet TAKE 1 TABLET BY MOUTH ONCE DAILY IN THE MORNING 09/23/23   Katrinka Garnette KIDD, MD  predniSONE  (DELTASONE ) 20 MG tablet Take 2 pills in the morning with breakfast for 3 days, then 1 pill for 2 days Patient not taking: Reported on 08/21/2023 05/27/23   Lucius Krabbe, NP  predniSONE  (DELTASONE ) 5 MG tablet TAKE 1 TABLET BY MOUTH ONCE DAILY WITH BREAKFAST 08/12/23   Cheryl Waddell HERO, PA-C  traZODone  (DESYREL ) 50 MG tablet TAKE 1/2 TO 1 (ONE-HALF TO ONE) TABLET BY MOUTH AT BEDTIME AS NEEDED FOR SLEEP Patient not taking: Reported on 08/21/2023 01/28/23   Katrinka Garnette KIDD, MD  triamcinolone  (NASACORT ) 55 MCG/ACT AERO nasal inhaler Place 1 spray into the nose daily. Start with 1 spray each side twice a day for 3 days, then reduce to daily. 05/27/23   Lucius Krabbe, NP    Family History    Family History  Problem Relation Age of Onset   Hypertension Father    Heart disease Father        Mi age 56, 69, 48 and 55.   Cancer Father    Hyperlipidemia Father    Colon cancer Maternal Uncle    Hypertension Mother    Cancer Mother    Hyperlipidemia Mother    Pulmonary fibrosis Sister    Epilepsy Son    Healthy Son    Myasthenia gravis Son    Healthy Son    Rheum arthritis Niece    Lupus Niece    Rheum arthritis Niece    She indicated that her mother is deceased. She indicated that her father is deceased. She indicated that both of her sisters are deceased. She indicated that both of her sons are alive. She indicated that the status of her maternal uncle is unknown. She indicated that both of her nieces are alive.  Social History    Social  History   Socioeconomic History   Marital status: Married    Spouse name: Not on file   Number of children: Not on file   Years of education: Not on file   Highest education level: Associate degree: academic program  Occupational History   Not on file  Tobacco Use   Smoking status: Never    Passive exposure: Past   Smokeless tobacco: Never  Vaping Use   Vaping status: Never Used  Substance and Sexual Activity   Alcohol use: No    Alcohol/week: 0.0 standard drinks  of alcohol   Drug use: No   Sexual activity: Not on file  Other Topics Concern   Not on file  Social History Narrative   Husband and 2 adult children live at home. No grandkids. Son with epilepsy.      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     Social Drivers of Health   Financial Resource Strain: Medium Risk (05/26/2023)   Overall Financial Resource Strain (CARDIA)    Difficulty of Paying Living Expenses: Somewhat hard  Food Insecurity: No Food Insecurity (05/26/2023)   Hunger Vital Sign    Worried About Running Out of Food in the Last Year: Never true    Ran Out of Food in the Last Year: Never true  Transportation Needs: No Transportation Needs (05/26/2023)   PRAPARE - Administrator, Civil Service (Medical): No    Lack of Transportation (Non-Medical): No  Physical Activity: Insufficiently Active (05/26/2023)   Exercise Vital Sign    Days of Exercise per Week: 2 days    Minutes of Exercise per Session: 20 min  Stress: No Stress Concern Present (05/26/2023)   [employer] of Occupational Health - Occupational Stress Questionnaire    Feeling of Stress : Only a little  Social Connections: Socially Integrated (05/26/2023)   Social Connection and Isolation Panel    Frequency of Communication with Friends and Family: More than three times a week    Frequency of Social Gatherings with Friends and Family: Twice a week    Attends Religious Services: More than 4 times per year    Active Member of  [employer] or Organizations: Yes    Attends Banker Meetings: 1 to 4 times per year    Marital Status: Married  Catering manager Violence: Not At Risk (04/02/2023)   Humiliation, Afraid, Rape, and Kick questionnaire    Fear of Current or Ex-Partner: No    Emotionally Abused: No    Physically Abused: No    Sexually Abused: No     Review of Systems    General:  No chills, fever, night sweats or weight changes.  Cardiovascular:  No chest pain, dyspnea on exertion, edema, orthopnea, palpitations, paroxysmal nocturnal dyspnea. Dermatological: No rash, lesions/masses Respiratory: No cough, dyspnea Urologic: No hematuria, dysuria Abdominal:   No nausea, vomiting, diarrhea, bright red blood per rectum, melena, or hematemesis Neurologic:  No visual changes, wkns, changes in mental status. All other systems reviewed and are otherwise negative except as noted above.  Physical Exam    VS:  BP 127/89 (BP Location: Right Arm, Patient Position: Sitting, Cuff Size: Normal)   Pulse 86   Ht 5' 1.2 (1.554 m)   Wt 158 lb 9.6 oz (71.9 kg)   SpO2 94%   BMI 29.77 kg/m  , BMI Body mass index is 29.77 kg/m. GEN: Well nourished, well developed, in no acute distress. HEENT: normal. Neck: Supple, no JVD, carotid bruits, or masses. Cardiac: RRR, no murmurs, rubs, or gallops. No clubbing, cyanosis, edema.  Radials/DP/PT 2+ and equal bilaterally.  Respiratory:  Respirations regular and unlabored, clear to auscultation bilaterally. GI: Soft, nontender, nondistended, BS + x 4. MS: no deformity or atrophy. Skin: warm and dry, no rash. Neuro:  Strength and sensation are intact. Psych: Normal affect.  Accessory Clinical Findings    Recent Labs: 05/16/2023: ALT 9; BUN 9; Creat 0.80; Hemoglobin 13.1; Platelets 236; Potassium 3.6; Sodium 141   Recent Lipid Panel    Component Value Date/Time  CHOL 189 08/14/2023 0911   TRIG 58 08/14/2023 0911   HDL 79 08/14/2023 0911   CHOLHDL 2.4 08/14/2023  0911   CHOLHDL 3 04/25/2022 0928   VLDL 13.6 04/25/2022 0928   LDLCALC 99 08/14/2023 0911   LDLCALC 82 01/12/2020 0834   LDLDIRECT 176.0 12/15/2015 1103         ECG personally reviewed by me today-none today.    Exercise tolerance test 04/13/2021     The patient exercised according to the BRUCE protocol for 3:43min achieving 4.   Target HR was not achieved (105; 70% MPHR)   No ST deviation was noted.   Study nondiagnostic due to failure to reach target HR. Poor exercise capacity.      Assessment & Plan   1.  Essential hypertension-BP today 127/89.  Home blood pressure monitor about 20-30 points higher systolic. Maintain blood pressure log Heart healthy low-sodium diet Increase physical activity as tolerated Increase amlodipine  to 5 mg daily Reviewed secondary causes of hypertension. Please check home blood pressure cuff against accurate monitor.  Hyperlipidemia-LDL 99 on 08/14/23.  Intolerance to simvastatin  High-fiber diet Continue ezetimibe , Repatha , aspirin    Coronary artery disease-denies exertional chest pain.  Status post MI 2009.  Medical management was recommended. Continue amlodipine , aspirin , ezetimibe , Repatha  Heart healthy low-sodium diet Continue to monitor  Disposition: Follow-up with Dr. Lavona or me in 6 months.   Josefa HERO. Roran Wegner NP-C     10/07/2023, 3:15 PM [facility] [address] [place], [place] [address] Office [phone]      I spent 14 minutes examining this patient, reviewing medications, and using patient centered shared decision making involving their cardiac care.   I spent  20 minutes reviewing past medical history,  medications, and prior cardiac tests.

## 2023-10-07 ENCOUNTER — Encounter: Payer: Self-pay | Admitting: General Practice

## 2023-10-07 ENCOUNTER — Ambulatory Visit: Attending: General Practice | Admitting: General Practice

## 2023-10-07 VITALS — BP 127/89 | HR 86 | Ht 61.2 in | Wt 158.6 lb

## 2023-10-07 DIAGNOSIS — I10 Essential (primary) hypertension: Secondary | ICD-10-CM | POA: Diagnosis not present

## 2023-10-07 DIAGNOSIS — E785 Hyperlipidemia, unspecified: Secondary | ICD-10-CM

## 2023-10-07 DIAGNOSIS — I2510 Atherosclerotic heart disease of native coronary artery without angina pectoris: Secondary | ICD-10-CM

## 2023-10-07 MED ORDER — AMLODIPINE BESYLATE 5 MG PO TABS: 5.0000 mg | ORAL_TABLET | Freq: Every day | ORAL | 3 refills | Status: AC

## 2023-10-07 NOTE — Patient Instructions (Signed)
 Medication Instructions:  Your physician recommends that you continue on your current medications as directed. Please refer to the Current Medication list given to you today.  *If you need a refill on your cardiac medications before your next appointment, please call your pharmacy*   Follow-Up: At [facility], you and your health needs are our priority.  As part of our continuing mission to provide you with exceptional heart care, our providers are all part of one team.  This team includes your primary Cardiologist (physician) and Advanced Practice Providers or APPs (Physician Assistants and Nurse Practitioners) who all work together to provide you with the care you need, when you need it.  Your next appointment:   6 month(s)  Provider:   Josefa Beauvais, NP

## 2023-10-13 ENCOUNTER — Encounter: Payer: Self-pay | Admitting: Family Medicine

## 2023-10-21 ENCOUNTER — Encounter: Payer: Self-pay | Admitting: Family Medicine

## 2023-10-25 DIAGNOSIS — M25531 Pain in right wrist: Secondary | ICD-10-CM | POA: Diagnosis not present

## 2023-10-26 ENCOUNTER — Encounter: Payer: Self-pay | Admitting: Rheumatology

## 2023-10-28 ENCOUNTER — Ambulatory Visit: Admitting: Physician Assistant

## 2023-10-28 NOTE — Progress Notes (Deleted)
 Office Visit Note  Patient: [name]             Date of Birth: 01/28/1951           MRN: [id number]             PCP: Katrinka Garnette KIDD, MD Referring: Katrinka Garnette KIDD, MD Visit Date: 10/28/2023 Occupation: Data Unavailable  Subjective:  Right wrist pain   History of Present Illness: [name] is a 73 y.o. female with history of osteoporosis, polymyalgia rheumatica, and osteoarthritis.   DEXA 08/07/2021:Bilateral FN BMD 0.619 T-score -2.1. L1 compression fracture.  Long-term systemic prednisone  use-prednisone  5 mg daily currently.  Repeat DEXA scan is pending.Forteo  October 2021-February 2022 restarted June 2022 (missed 3 weeks due to fracture) and completed January 2024.  IV Reclast  was given on May 07, 2022 and June 06, 2023.  She has been taking calcium  and vitamin D .  Will reach out to the patient once the DEXA scan results are available.   Activities of Daily Living:  Patient reports morning stiffness for *** {minute/hour:19697}.   Patient {ACTIONS;DENIES/REPORTS:21021675::Denies} nocturnal pain.  Difficulty dressing/grooming: {ACTIONS;DENIES/REPORTS:21021675::Denies} Difficulty climbing stairs: {ACTIONS;DENIES/REPORTS:21021675::Denies} Difficulty getting out of chair: {ACTIONS;DENIES/REPORTS:21021675::Denies} Difficulty using hands for taps, buttons, cutlery, and/or writing: {ACTIONS;DENIES/REPORTS:21021675::Denies}  No Rheumatology ROS completed.   PMFS History:  Patient Active Problem List   Diagnosis Date Noted   Essential hypertension 05/01/2023   B12 deficiency 04/26/2022   Hyperglycemia 04/25/2022   Unilateral primary osteoarthritis, left hip 08/24/2021   Shoulder fracture 08/10/2020   Lumbar compression fracture (HCC) 05/11/2019   Dyslipidemia 01/26/2019   Drug-induced myopathy 01/14/2019   Haglund's deformity 01/12/2019   High risk medication use 12/04/2018   Lower esophageal ring ([place]) 03/03/2018   Osteoarthritis of right hip  12/02/2017   GERD (gastroesophageal reflux disease) 12/15/2015   Polyp of colon, adenomatous 10/11/2014   Vitamin D  deficiency 05/26/2014   Osteoporosis 05/26/2014   Polymyalgia rheumatica (HCC) 12/01/2010   CAD (coronary artery disease) 09/09/2007   Hematuria 09/09/2007   Depression 03/20/2007   Hypothyroid 02/13/1999    Past Medical History:  Diagnosis Date   Anxiety    Arthritis    CAD (coronary artery disease)    Cataract    Depression    DISORDER, MENOPAUSAL NOS 03/20/2007   Qualifier: Diagnosis of  By: Mavis MD, Norleen BRAVO    GERD (gastroesophageal reflux disease)    Hyperlipidemia    Hypothyroidism    MYOCARDIAL INFARCTION, HX OF 09/09/2007   Qualifier: Diagnosis of  By: Tammie MD, Bruce     Osteoporosis    Polymyalgia rheumatica (HCC)     Family History  Problem Relation Age of Onset   Hypertension Father    Heart disease Father        Mi age 92, 65, 78 and 64.   Cancer Father    Hyperlipidemia Father    Colon cancer Maternal Uncle    Hypertension Mother    Cancer Mother    Hyperlipidemia Mother    Pulmonary fibrosis Sister    Epilepsy Son    Healthy Son    Myasthenia gravis Son    Healthy Son    Rheum arthritis Niece    Lupus Niece    Rheum arthritis Niece    Past Surgical History:  Procedure Laterality Date   CATARACT EXTRACTION     CESAREAN SECTION     CHOLECYSTECTOMY N/A 11/24/2012   Procedure: LAPAROSCOPIC CHOLECYSTECTOMY;  Surgeon: Lynda Leos, MD;  Location: [facility] OR;  Service: General;  Laterality: N/A;   COSMETIC SURGERY     EYE SURGERY     eye lids lifted   PTCA     REVERSE SHOULDER ARTHROPLASTY Right 08/12/2020   Procedure: REVERSE SHOULDER ARTHROPLASTY;  Surgeon: Sharl Selinda Dover, MD;  Location: [facility] OR;  Service: Orthopedics;  Laterality: Right;   Social History   Tobacco Use   Smoking status: Never    Passive exposure: Past   Smokeless tobacco: Never  Vaping Use   Vaping status: Never Used  Substance Use Topics   Alcohol use: No     Alcohol/week: 0.0 standard drinks of alcohol   Drug use: No   Social History   Social History Narrative   Husband and 2 adult children live at home. No grandkids. Son with epilepsy.      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       Immunization History  Administered Date(s) Administered    sv, Bivalent, Protein Subunit Rsvpref,pf (Abrysvo) 11/16/2021   Fluad Quad(high Dose 65+) 11/10/2019, 11/21/2020, 11/28/2021   INFLUENZA, HIGH DOSE SEASONAL PF 10/30/2016, 12/02/2017, 11/08/2018, 10/26/2022   Influenza Whole 11/09/2008   Influenza-Unspecified 11/08/2018   Moderna Covid-19 Fall Seasonal Vaccine 42yrs & older 10/26/2022   PFIZER Comirnaty(Gray Top)Covid-19 Tri-Sucrose Vaccine 11/16/2021   PFIZER(Purple Top)SARS-COV-2 Vaccination 02/26/2019, 03/20/2019, 11/28/2019   PNEUMOCOCCAL CONJUGATE-20 06/21/2020   Pfizer Covid-19 Vaccine Bivalent Booster 50yrs & up 11/21/2020, 07/22/2021   Pneumococcal Conjugate-13 10/11/2014   Pneumococcal Polysaccharide-23 12/15/2015   Td 02/12/2006   Td (Adult), 2 Lf Tetanus Toxid, Preservative Free 02/12/2006   Tdap 04/26/2017   Zoster Recombinant(Shingrix) 07/18/2021, 09/19/2021   Zoster, Live 09/19/2010     Objective: Vital Signs: There were no vitals taken for this visit.   Physical Exam Vitals and nursing note reviewed.  Constitutional:      Appearance: She is well-developed.  HENT:     Head: Normocephalic and atraumatic.  Eyes:     Conjunctiva/sclera: Conjunctivae normal.  Cardiovascular:     Rate and Rhythm: Normal rate and regular rhythm.     Heart sounds: Normal heart sounds.  Pulmonary:     Effort: Pulmonary effort is normal.     Breath sounds: Normal breath sounds.  Abdominal:     General: Bowel sounds are normal.     Palpations: Abdomen is soft.  Musculoskeletal:     Cervical back: Normal range of motion.  Lymphadenopathy:     Cervical: No cervical adenopathy.  Skin:    General: Skin is warm and dry.      Capillary Refill: Capillary refill takes less than 2 seconds.  Neurological:     Mental Status: She is alert and oriented to person, place, and time.  Psychiatric:        Behavior: Behavior normal.      Musculoskeletal Exam: ***  CDAI Exam: CDAI Score: -- Patient Global: --; Provider Global: -- Swollen: --; Tender: -- Joint Exam 10/28/2023   No joint exam has been documented for this visit   There is currently no information documented on the homunculus. Go to the Rheumatology activity and complete the homunculus joint exam.  Investigation: No additional findings.  Imaging: No results found.  Recent Labs: Lab Results  Component Value Date   WBC 7.4 05/16/2023   HGB 13.1 05/16/2023   PLT 236 05/16/2023   NA 141 05/16/2023   K 3.6 05/16/2023   CL 104 05/16/2023   CO2 31 05/16/2023   GLUCOSE 68 05/16/2023  BUN 9 05/16/2023   CREATININE 0.80 05/16/2023   BILITOT 0.6 05/16/2023   ALKPHOS 79 05/15/2022   AST 15 05/16/2023   ALT 9 05/16/2023   PROT 5.9 (L) 05/16/2023   ALBUMIN 2.3 (L) 05/15/2022   CALCIUM  8.9 05/16/2023   GFRAA 75 04/22/2020   QFTBGOLDPLUS NEGATIVE 08/12/2018    Speciality Comments: Forteo  October 2021-February 2022 restarted June 2022 (missed 3 weeks due to fracture)  Should be completing Forteo  in December 2023 Reclast  May 07, 2022, June 06, 2023  Procedures:  No procedures performed Allergies: Morphine  and codeine, Prochlorperazine, Simvastatin , and Sulfa antibiotics   Assessment / Plan:     Visit Diagnoses: No diagnosis found.  Orders: No orders of the defined types were placed in this encounter.  No orders of the defined types were placed in this encounter.   Face-to-face time spent with patient was *** minutes. Greater than 50% of time was spent in counseling and coordination of care.  Follow-Up Instructions: No follow-ups on file.   Waddell CHRISTELLA Craze, PA-C  Note - This record has been created using Dragon software.  Chart  creation errors have been sought, but may not always  have been located. Such creation errors do not reflect on  the standard of medical care.

## 2023-10-28 NOTE — Progress Notes (Deleted)
 Office Visit Note  Patient: [name]             Date of Birth: 28-Mar-1950           MRN: [id number]             PCP: Katrinka Garnette KIDD, MD Referring: Katrinka Garnette KIDD, MD Visit Date: 10/28/2023 Occupation: Data Unavailable  Subjective:  No chief complaint on file.   History of Present Illness: [name] is a 73 y.o. female ***     Activities of Daily Living:  Patient reports morning stiffness for *** {minute/hour:19697}.   Patient {ACTIONS;DENIES/REPORTS:21021675::Denies} nocturnal pain.  Difficulty dressing/grooming: {ACTIONS;DENIES/REPORTS:21021675::Denies} Difficulty climbing stairs: {ACTIONS;DENIES/REPORTS:21021675::Denies} Difficulty getting out of chair: {ACTIONS;DENIES/REPORTS:21021675::Denies} Difficulty using hands for taps, buttons, cutlery, and/or writing: {ACTIONS;DENIES/REPORTS:21021675::Denies}  No Rheumatology ROS completed.   PMFS History:  Patient Active Problem List   Diagnosis Date Noted   Essential hypertension 05/01/2023   B12 deficiency 04/26/2022   Hyperglycemia 04/25/2022   Unilateral primary osteoarthritis, left hip 08/24/2021   Shoulder fracture 08/10/2020   Lumbar compression fracture (HCC) 05/11/2019   Dyslipidemia 01/26/2019   Drug-induced myopathy 01/14/2019   Haglund's deformity 01/12/2019   High risk medication use 12/04/2018   Lower esophageal ring (Schatzki) 03/03/2018   Osteoarthritis of right hip 12/02/2017   GERD (gastroesophageal reflux disease) 12/15/2015   Polyp of colon, adenomatous 10/11/2014   Vitamin D  deficiency 05/26/2014   Osteoporosis 05/26/2014   Polymyalgia rheumatica (HCC) 12/01/2010   CAD (coronary artery disease) 09/09/2007   Hematuria 09/09/2007   Depression 03/20/2007   Hypothyroid 02/13/1999    Past Medical History:  Diagnosis Date   Anxiety    Arthritis    CAD (coronary artery disease)    Cataract    Depression    DISORDER, MENOPAUSAL NOS 03/20/2007   Qualifier: Diagnosis of   By: Mavis MD, Norleen BRAVO    GERD (gastroesophageal reflux disease)    Hyperlipidemia    Hypothyroidism    MYOCARDIAL INFARCTION, HX OF 09/09/2007   Qualifier: Diagnosis of  By: Tammie MD, Bruce     Osteoporosis    Polymyalgia rheumatica (HCC)     Family History  Problem Relation Age of Onset   Hypertension Father    Heart disease Father        Mi age 78, 85, 36 and 69.   Cancer Father    Hyperlipidemia Father    Colon cancer Maternal Uncle    Hypertension Mother    Cancer Mother    Hyperlipidemia Mother    Pulmonary fibrosis Sister    Epilepsy Son    Healthy Son    Myasthenia gravis Son    Healthy Son    Rheum arthritis Niece    Lupus Niece    Rheum arthritis Niece    Past Surgical History:  Procedure Laterality Date   CATARACT EXTRACTION     CESAREAN SECTION     CHOLECYSTECTOMY N/A 11/24/2012   Procedure: LAPAROSCOPIC CHOLECYSTECTOMY;  Surgeon: Lynda Leos, MD;  Location: [facility] OR;  Service: General;  Laterality: N/A;   COSMETIC SURGERY     EYE SURGERY     eye lids lifted   PTCA     REVERSE SHOULDER ARTHROPLASTY Right 08/12/2020   Procedure: REVERSE SHOULDER ARTHROPLASTY;  Surgeon: Sharl Selinda Dover, MD;  Location[facility];  Service: Orthopedics;  Laterality: Right;   Social History   Tobacco Use   Smoking status: Never    Passive exposure: Past   Smokeless tobacco: Never  Vaping  Use   Vaping status: Never Used  Substance Use Topics   Alcohol use: No    Alcohol/week: 0.0 standard drinks of alcohol   Drug use: No   Social History   Social History Narrative   Husband and 2 adult children live at home. No grandkids. Son with epilepsy.      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       Immunization History  Administered Date(s) Administered    sv, Bivalent, Protein Subunit Rsvpref,pf (Abrysvo) 11/16/2021   Fluad Quad(high Dose 65+) 11/10/2019, 11/21/2020, 11/28/2021   INFLUENZA, HIGH DOSE SEASONAL PF 10/30/2016, 12/02/2017, 11/08/2018, 10/26/2022    Influenza Whole 11/09/2008   Influenza-Unspecified 11/08/2018   Moderna Covid-19 Fall Seasonal Vaccine 30yrs & older 10/26/2022   PFIZER Comirnaty(Gray Top)Covid-19 Tri-Sucrose Vaccine 11/16/2021   PFIZER(Purple Top)SARS-COV-2 Vaccination 02/26/2019, 03/20/2019, 11/28/2019   PNEUMOCOCCAL CONJUGATE-20 06/21/2020   Pfizer Covid-19 Vaccine Bivalent Booster 45yrs & up 11/21/2020, 07/22/2021   Pneumococcal Conjugate-13 10/11/2014   Pneumococcal Polysaccharide-23 12/15/2015   Td 02/12/2006   Td (Adult), 2 Lf Tetanus Toxid, Preservative Free 02/12/2006   Tdap 04/26/2017   Zoster Recombinant(Shingrix) 07/18/2021, 09/19/2021   Zoster, Live 09/19/2010     Objective: Vital Signs: There were no vitals taken for this visit.   Physical Exam   Musculoskeletal Exam: ***  CDAI Exam: CDAI Score: -- Patient Global: --; Provider Global: -- Swollen: --; Tender: -- Joint Exam 10/28/2023   No joint exam has been documented for this visit   There is currently no information documented on the homunculus. Go to the Rheumatology activity and complete the homunculus joint exam.  Investigation: No additional findings.  Imaging: No results found.  Recent Labs: Lab Results  Component Value Date   WBC 7.4 05/16/2023   HGB 13.1 05/16/2023   PLT 236 05/16/2023   NA 141 05/16/2023   K 3.6 05/16/2023   CL 104 05/16/2023   CO2 31 05/16/2023   GLUCOSE 68 05/16/2023   BUN 9 05/16/2023   CREATININE 0.80 05/16/2023   BILITOT 0.6 05/16/2023   ALKPHOS 79 05/15/2022   AST 15 05/16/2023   ALT 9 05/16/2023   PROT 5.9 (L) 05/16/2023   ALBUMIN 2.3 (L) 05/15/2022   CALCIUM  8.9 05/16/2023   GFRAA 75 04/22/2020   QFTBGOLDPLUS NEGATIVE 08/12/2018    Speciality Comments: Forteo  October 2021-February 2022 restarted June 2022 (missed 3 weeks due to fracture)  Should be completing Forteo  in December 2023 Reclast  May 07, 2022, June 06, 2023  Procedures:  No procedures performed Allergies: Morphine   and codeine, Prochlorperazine, Simvastatin , and Sulfa antibiotics   Assessment / Plan:     Visit Diagnoses: No diagnosis found.  Orders: No orders of the defined types were placed in this encounter.  No orders of the defined types were placed in this encounter.   Face-to-face time spent with patient was *** minutes. Greater than 50% of time was spent in counseling and coordination of care.  Follow-Up Instructions: No follow-ups on file.   Shelba SHAUNNA Potters, RT  Note - This record has been created using [employer].  Chart creation errors have been sought, but may not always  have been located. Such creation errors do not reflect on  the standard of medical care.

## 2023-10-31 ENCOUNTER — Ambulatory Visit: Attending: Cardiology | Admitting: Pharmacist

## 2023-10-31 DIAGNOSIS — I2510 Atherosclerotic heart disease of native coronary artery without angina pectoris: Secondary | ICD-10-CM

## 2023-10-31 DIAGNOSIS — E785 Hyperlipidemia, unspecified: Secondary | ICD-10-CM | POA: Diagnosis not present

## 2023-10-31 MED ORDER — ROSUVASTATIN CALCIUM 5 MG PO TABS: 5.0000 mg | ORAL_TABLET | ORAL | 3 refills | Status: AC

## 2023-10-31 NOTE — Assessment & Plan Note (Signed)
 Assessment: LDL cholesterol above goal of less than 70 She did have premature ASCVD so could consider less than 55 LDL cholesterol 99 on Repatha  and ezetimibe  Intolerant to simvastatin , atorvastatin, pitavastatin  Takes chronic prednisone  which could affect cholesterol but she has a baseline LDL cholesterol above 200 therefore more than likely has familial hyperlipidemia Reviewed diet and recommended decrease in sweets and butter Not much exercise due to chronic back pain Recommended desk cycle  We discussed trying a more hydrophilic statin at a very low dose.  Plan: Start rosuvastatin  5 mg 3 times a week-I wrote prescription for every other day in case patient tolerates well and wants to increase Recheck labs in 3 months Continue Repatha  every 14 days and ezetimibe  daily Increase physical activity

## 2023-10-31 NOTE — Progress Notes (Signed)
 Patient ID: [name]                 DOB: 03/23/1950                    MRN: [id number]      HPI: [name] is a 73 y.o. female patient referred to lipid clinic by Dr. Lavona. PMH is significant for coronary artery disease s/p MI in 2009, HLD, HTN, and back pain.   Patient has been on Repatha  for several years.  She had a short period of time where she stopped taking it accidentally.  Most recent LDL cholesterol is 99 on Repatha  and ezetimibe .  Patient presents today to clinic accompanied by her husband.  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  Previously on simvastatin , pitavastatin  and atorvastatin which caused muscle cramps.  No issues with current regimen.  We discussed trying a more hydrophilic statin at a very low dose.   Current Medications: Repatha  140mg  q 14 days, ezetimibe  10mg  daily Intolerances: simvastatin  (leg cramps) simvastatin  20mg  (april/2016), pitavastatin  1-2mg  (July/2012-Oct/2014), Atorvastatin 20mg , zetia  10mg  - All medication caused severe myalgia and problems ambulating  Risk Factors: CAD, age. HTN LDL-C goal: <70  ApoB goal:  <80  Diet:  Rotates beef, chicken, pork 1 green vegetable per day Diet Dr. Nunzio 3 per day Cookies daily Occasionally eats lunch Potatoes w/ a lot of butter, vegetable and meat for dinner  Exercise: housework  Family History:  Family History  Problem Relation Age of Onset   Hypertension Father    Heart disease Father        Mi age 6, 29, 20 and 13.   Cancer Father    Hyperlipidemia Father    Colon cancer Maternal Uncle    Hypertension Mother    Cancer Mother    Hyperlipidemia Mother    Pulmonary fibrosis Sister    Epilepsy Son    Healthy Son    Myasthenia gravis Son    Healthy Son    Rheum arthritis Niece    Lupus Niece    Rheum arthritis Niece     Social History: no ETOH, no tobacco, no illict substances  Labs: Lipid Panel     Component Value Date/Time   CHOL 189 08/14/2023  0911   TRIG 58 08/14/2023 0911   HDL 79 08/14/2023 0911   CHOLHDL 2.4 08/14/2023 0911   CHOLHDL 3 04/25/2022 0928   VLDL 13.6 04/25/2022 0928   LDLCALC 99 08/14/2023 0911   LDLCALC 82 01/12/2020 0834   LDLDIRECT 176.0 12/15/2015 1103   LABVLDL 11 08/14/2023 0911    Past Medical History:  Diagnosis Date   Anxiety    Arthritis    CAD (coronary artery disease)    Cataract    Depression    DISORDER, MENOPAUSAL NOS 03/20/2007   Qualifier: Diagnosis of  By: Mavis MD, Norleen BRAVO    GERD (gastroesophageal reflux disease)    Hyperlipidemia    Hypothyroidism    MYOCARDIAL INFARCTION, HX OF 09/09/2007   Qualifier: Diagnosis of  By: Tammie MD, Bruce     Osteoporosis    Polymyalgia rheumatica (HCC)     Current Outpatient Medications on File Prior to Visit  Medication Sig Dispense Refill   amLODipine  (NORVASC ) 5 MG tablet Take 1 tablet (5 mg total) by mouth daily. 90 tablet 3   aspirin  81 MG EC tablet Take 81 mg by mouth at bedtime.     Evolocumab  (REPATHA   SURECLICK) 140 MG/ML SOAJ Inject 140 mg into the skin every 14 (fourteen) days. 6 mL 3   ezetimibe  (ZETIA ) 10 MG tablet Take 1 tablet (10 mg total) by mouth daily. 90 tablet 3   levothyroxine  (SYNTHROID ) 100 MCG tablet Take 1 tablet (100 mcg total) by mouth daily. 90 tablet 0   pantoprazole  (PROTONIX ) 40 MG tablet Take 1 tablet (40 mg total) by mouth daily. 90 tablet 3   PARoxetine  (PAXIL ) 20 MG tablet TAKE 1 TABLET BY MOUTH ONCE DAILY IN THE MORNING 90 tablet 0   predniSONE  (DELTASONE ) 5 MG tablet TAKE 1 TABLET BY MOUTH ONCE DAILY WITH BREAKFAST 90 tablet 0   Current Facility-Administered Medications on File Prior to Visit  Medication Dose Route Frequency Provider Last Rate Last Admin   0.9 %  sodium chloride  infusion  500 mL Intravenous Once Armbruster, Elspeth SQUIBB, MD        Allergies  Allergen Reactions   Morphine  And Codeine Nausea And Vomiting   Prochlorperazine     REACTION: nerve reaction   Simvastatin      REACTION: leg cramps    Sulfa Antibiotics     Assessment/Plan:  1. Hyperlipidemia -  Dyslipidemia Assessment: LDL cholesterol above goal of less than 70 She did have premature ASCVD so could consider less than 55 LDL cholesterol 99 on Repatha  and ezetimibe  Intolerant to simvastatin , atorvastatin, pitavastatin  Takes chronic prednisone  which could affect cholesterol but she has a baseline LDL cholesterol above 200 therefore more than likely has familial hyperlipidemia Reviewed diet and recommended decrease in sweets and butter Not much exercise due to chronic back pain Recommended desk cycle  We discussed trying a more hydrophilic statin at a very low dose.  Plan: Start rosuvastatin  5 mg 3 times a week-I wrote prescription for every other day in case patient tolerates well and wants to increase Recheck labs in 3 months Continue Repatha  every 14 days and ezetimibe  daily Increase physical activity    Thank you,  Junious Ragone D Macie Baum, Pharm.JONETTA SARAN, CPP [facility] [address], [place], [place] [address]  Phone: [phone]; Fax: [phone]

## 2023-10-31 NOTE — Patient Instructions (Signed)
 Please start taking rosuvastatin  5mg  three times a week Consider using a desk cycle for exercise

## 2023-11-01 ENCOUNTER — Encounter: Payer: Self-pay | Admitting: Cardiology

## 2023-11-04 ENCOUNTER — Encounter: Payer: Self-pay | Admitting: Family Medicine

## 2023-11-04 ENCOUNTER — Other Ambulatory Visit: Payer: Self-pay | Admitting: Physician Assistant

## 2023-11-04 ENCOUNTER — Telehealth: Payer: Self-pay | Admitting: Pharmacy Technician

## 2023-11-04 MED ORDER — REPATHA SURECLICK 140 MG/ML ~~LOC~~ SOAJ: 1.0000 | SUBCUTANEOUS | 3 refills | Status: AC

## 2023-11-04 NOTE — Telephone Encounter (Signed)
   Patient Advocate Encounter   The patient was approved for a [employer] grant that will help cover the cost of repatha  Total amount awarded, 2500.  Effective: 10/05/23 - 10/03/24   APW:[id number] ERW:[id number] Hmnle:[id number] PI:[id number] [employer] ID: [id number]   Pharmacy provided with approval and processing information. Patient informed via mychart

## 2023-11-04 NOTE — Telephone Encounter (Signed)
 Last Fill: 08/12/2023   Next Visit: 11/19/2023   Last Visit: 05/16/2023   Dx: Polymyalgia rheumatica    Current Dose per office note on 05/16/2023: prednisone  5 mg daily and has been unable to taper.   Last Hgb A1C 05/16/2023 and it was 5.9    Okay to refill Prednisone ?

## 2023-11-05 ENCOUNTER — Telehealth: Payer: Self-pay | Admitting: Pharmacy Technician

## 2023-11-05 ENCOUNTER — Other Ambulatory Visit (HOSPITAL_COMMUNITY): Payer: Self-pay

## 2023-11-05 NOTE — Telephone Encounter (Signed)
 Pharmacy Patient Advocate Encounter  Received notification from [facility] that Prior Authorization for repatha  has been APPROVED from 11/05/23 to 11/04/24. Ran test claim, Copay is $501.39- 3 months. This test claim was processed through [facility]- copay amounts may vary at other pharmacies due to pharmacy/plan contracts, or as the patient moves through the different stages of their insurance plan.   PA #/Case ID/Reference #: [id number]

## 2023-11-05 NOTE — Telephone Encounter (Signed)
 Pharmacy Patient Advocate Encounter   Received notification from CoverMyMeds that prior authorization for Repatha  is required/requested.   Insurance verification completed.   The patient is insured through [facility] .   Per test claim: PA required; PA submitted to above mentioned insurance via Latent Key/confirmation #/EOC [id number] Status is pending

## 2023-11-06 ENCOUNTER — Telehealth: Payer: Self-pay | Admitting: Cardiology

## 2023-11-06 NOTE — Telephone Encounter (Signed)
  Patient messaged through MyChart asking about the assistance program for her Repatha  and she asked that the prescription and information be sent to [facility] pharmacy. The script was sent to correct pharmacy but according to last MyChart message her grant information was sent to [facility] pharmacy. Can the grant info be sent to  [facility] [place],  - [address]  So that she can get her Repatha

## 2023-11-06 NOTE — Progress Notes (Deleted)
 Office Visit Note  Patient: [name]             Date of Birth: 1950-07-31           MRN: [id number]             PCP: Katrinka Garnette KIDD, MD Referring: Katrinka Garnette KIDD, MD Visit Date: 11/19/2023 Occupation: Data Unavailable  Subjective:  No chief complaint on file.   History of Present Illness: [name] is a 73 y.o. female ***     Activities of Daily Living:  Patient reports morning stiffness for *** {minute/hour:19697}.   Patient {ACTIONS;DENIES/REPORTS:21021675::Denies} nocturnal pain.  Difficulty dressing/grooming: {ACTIONS;DENIES/REPORTS:21021675::Denies} Difficulty climbing stairs: {ACTIONS;DENIES/REPORTS:21021675::Denies} Difficulty getting out of chair: {ACTIONS;DENIES/REPORTS:21021675::Denies} Difficulty using hands for taps, buttons, cutlery, and/or writing: {ACTIONS;DENIES/REPORTS:21021675::Denies}  No Rheumatology ROS completed.   PMFS History:  Patient Active Problem List   Diagnosis Date Noted   Essential hypertension 05/01/2023   B12 deficiency 04/26/2022   Hyperglycemia 04/25/2022   Unilateral primary osteoarthritis, left hip 08/24/2021   Shoulder fracture 08/10/2020   Lumbar compression fracture (HCC) 05/11/2019   Dyslipidemia 01/26/2019   Drug-induced myopathy 01/14/2019   Haglund's deformity 01/12/2019   High risk medication use 12/04/2018   Lower esophageal ring (Schatzki) 03/03/2018   Osteoarthritis of right hip 12/02/2017   GERD (gastroesophageal reflux disease) 12/15/2015   Polyp of colon, adenomatous 10/11/2014   Vitamin D  deficiency 05/26/2014   Osteoporosis 05/26/2014   Polymyalgia rheumatica 12/01/2010   CAD (coronary artery disease) 09/09/2007   Hematuria 09/09/2007   Depression 03/20/2007   Hypothyroid 02/13/1999    Past Medical History:  Diagnosis Date   Anxiety    Arthritis    CAD (coronary artery disease)    Cataract    Depression    DISORDER, MENOPAUSAL NOS 03/20/2007   Qualifier: Diagnosis of  By:  Mavis MD, Norleen BRAVO    GERD (gastroesophageal reflux disease)    Hyperlipidemia    Hypothyroidism    MYOCARDIAL INFARCTION, HX OF 09/09/2007   Qualifier: Diagnosis of  By: Tammie MD, Bruce     Osteoporosis    Polymyalgia rheumatica     Family History  Problem Relation Age of Onset   Hypertension Father    Heart disease Father        Mi age 53, 4, 25 and 45.   Cancer Father    Hyperlipidemia Father    Colon cancer Maternal Uncle    Hypertension Mother    Cancer Mother    Hyperlipidemia Mother    Pulmonary fibrosis Sister    Epilepsy Son    Healthy Son    Myasthenia gravis Son    Healthy Son    Rheum arthritis Niece    Lupus Niece    Rheum arthritis Niece    Past Surgical History:  Procedure Laterality Date   CATARACT EXTRACTION     CESAREAN SECTION     CHOLECYSTECTOMY N/A 11/24/2012   Procedure: LAPAROSCOPIC CHOLECYSTECTOMY;  Surgeon: Lynda Leos, MD;  Location: [facility] OR;  Service: General;  Laterality: N/A;   COSMETIC SURGERY     EYE SURGERY     eye lids lifted   PTCA     REVERSE SHOULDER ARTHROPLASTY Right 08/12/2020   Procedure: REVERSE SHOULDER ARTHROPLASTY;  Surgeon: Sharl Selinda Dover, MD;  Location: [facility] OR;  Service: Orthopedics;  Laterality: Right;   Social History   Tobacco Use   Smoking status: Never    Passive exposure: Past   Smokeless tobacco: Never  Vaping Use  Vaping status: Never Used  Substance Use Topics   Alcohol use: No    Alcohol/week: 0.0 standard drinks of alcohol   Drug use: No   Social History   Social History Narrative   Husband and 2 adult children live at home. No grandkids. Son with epilepsy.      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       Immunization History  Administered Date(s) Administered    sv, Bivalent, Protein Subunit Rsvpref,pf (Abrysvo) 11/16/2021   Fluad Quad(high Dose 65+) 11/10/2019, 11/21/2020, 11/28/2021   INFLUENZA, HIGH DOSE SEASONAL PF 10/30/2016, 12/02/2017, 11/08/2018, 10/26/2022   Influenza  Whole 11/09/2008   Influenza-Unspecified 11/08/2018   Moderna Covid-19 Fall Seasonal Vaccine 8yrs & older 10/26/2022   PFIZER Comirnaty(Gray Top)Covid-19 Tri-Sucrose Vaccine 11/16/2021   PFIZER(Purple Top)SARS-COV-2 Vaccination 02/26/2019, 03/20/2019, 11/28/2019   PNEUMOCOCCAL CONJUGATE-20 06/21/2020   Pfizer Covid-19 Vaccine Bivalent Booster 44yrs & up 11/21/2020, 07/22/2021   Pneumococcal Conjugate-13 10/11/2014   Pneumococcal Polysaccharide-23 12/15/2015   Td 02/12/2006   Td (Adult), 2 Lf Tetanus Toxid, Preservative Free 02/12/2006   Tdap 04/26/2017   Zoster Recombinant(Shingrix) 07/18/2021, 09/19/2021   Zoster, Live 09/19/2010     Objective: Vital Signs: There were no vitals taken for this visit.   Physical Exam   Musculoskeletal Exam: ***  CDAI Exam: CDAI Score: -- Patient Global: --; Provider Global: -- Swollen: --; Tender: -- Joint Exam 11/19/2023   No joint exam has been documented for this visit   There is currently no information documented on the homunculus. Go to the Rheumatology activity and complete the homunculus joint exam.  Investigation: No additional findings.  Imaging: No results found.  Recent Labs: Lab Results  Component Value Date   WBC 7.4 05/16/2023   HGB 13.1 05/16/2023   PLT 236 05/16/2023   NA 141 05/16/2023   K 3.6 05/16/2023   CL 104 05/16/2023   CO2 31 05/16/2023   GLUCOSE 68 05/16/2023   BUN 9 05/16/2023   CREATININE 0.80 05/16/2023   BILITOT 0.6 05/16/2023   ALKPHOS 79 05/15/2022   AST 15 05/16/2023   ALT 9 05/16/2023   PROT 5.9 (L) 05/16/2023   ALBUMIN 2.3 (L) 05/15/2022   CALCIUM  8.9 05/16/2023   GFRAA 75 04/22/2020   QFTBGOLDPLUS NEGATIVE 08/12/2018    Speciality Comments: Forteo  October 2021-February 2022 restarted June 2022 (missed 3 weeks due to fracture)  Should be completing Forteo  in December 2023 Reclast  May 07, 2022, June 06, 2023  Procedures:  No procedures performed Allergies: Morphine  and codeine,  Prochlorperazine, Simvastatin , and Sulfa antibiotics   Assessment / Plan:     Visit Diagnoses: No diagnosis found.  Orders: No orders of the defined types were placed in this encounter.  No orders of the defined types were placed in this encounter.   Face-to-face time spent with patient was *** minutes. Greater than 50% of time was spent in counseling and coordination of care.  Follow-Up Instructions: No follow-ups on file.   Daved JAYSON Gavel, CMA  Note - This record has been created using [employer].  Chart creation errors have been sought, but may not always  have been located. Such creation errors do not reflect on  the standard of medical care.

## 2023-11-07 NOTE — Telephone Encounter (Signed)
 Faxed grant info to pharmacy. Patient should also have the billing information via mychart that she can provide to the pharmacy directly.

## 2023-11-19 ENCOUNTER — Ambulatory Visit: Admitting: Rheumatology

## 2023-11-19 DIAGNOSIS — M25552 Pain in left hip: Secondary | ICD-10-CM

## 2023-11-19 DIAGNOSIS — Z7952 Long term (current) use of systemic steroids: Secondary | ICD-10-CM

## 2023-11-19 DIAGNOSIS — I2510 Atherosclerotic heart disease of native coronary artery without angina pectoris: Secondary | ICD-10-CM

## 2023-11-19 DIAGNOSIS — E559 Vitamin D deficiency, unspecified: Secondary | ICD-10-CM

## 2023-11-19 DIAGNOSIS — Z8781 Personal history of (healed) traumatic fracture: Secondary | ICD-10-CM

## 2023-11-19 DIAGNOSIS — D126 Benign neoplasm of colon, unspecified: Secondary | ICD-10-CM

## 2023-11-19 DIAGNOSIS — E538 Deficiency of other specified B group vitamins: Secondary | ICD-10-CM

## 2023-11-19 DIAGNOSIS — M47816 Spondylosis without myelopathy or radiculopathy, lumbar region: Secondary | ICD-10-CM

## 2023-11-19 DIAGNOSIS — Z5181 Encounter for therapeutic drug level monitoring: Secondary | ICD-10-CM

## 2023-11-19 DIAGNOSIS — M810 Age-related osteoporosis without current pathological fracture: Secondary | ICD-10-CM

## 2023-11-19 DIAGNOSIS — Z8659 Personal history of other mental and behavioral disorders: Secondary | ICD-10-CM

## 2023-11-19 DIAGNOSIS — M19042 Primary osteoarthritis, left hand: Secondary | ICD-10-CM

## 2023-11-19 DIAGNOSIS — M353 Polymyalgia rheumatica: Secondary | ICD-10-CM

## 2023-11-19 DIAGNOSIS — Z8639 Personal history of other endocrine, nutritional and metabolic disease: Secondary | ICD-10-CM

## 2023-11-19 DIAGNOSIS — M19071 Primary osteoarthritis, right ankle and foot: Secondary | ICD-10-CM

## 2023-11-19 DIAGNOSIS — Z8719 Personal history of other diseases of the digestive system: Secondary | ICD-10-CM

## 2023-11-19 DIAGNOSIS — M1611 Unilateral primary osteoarthritis, right hip: Secondary | ICD-10-CM

## 2023-11-19 DIAGNOSIS — Z96611 Presence of right artificial shoulder joint: Secondary | ICD-10-CM

## 2023-11-25 ENCOUNTER — Ambulatory Visit: Payer: Self-pay

## 2023-11-25 ENCOUNTER — Encounter: Payer: Self-pay | Admitting: Family Medicine

## 2023-11-25 ENCOUNTER — Telehealth: Admitting: Physician Assistant

## 2023-11-25 DIAGNOSIS — R300 Dysuria: Secondary | ICD-10-CM

## 2023-11-25 DIAGNOSIS — R34 Anuria and oliguria: Secondary | ICD-10-CM

## 2023-11-25 DIAGNOSIS — A084 Viral intestinal infection, unspecified: Secondary | ICD-10-CM

## 2023-11-25 NOTE — Telephone Encounter (Signed)
 2nd attempt to contact patient to review sx of pain with urination , V/D. No answer, LVMTCB # [phone].   Reason for Triage: Pt called in to book appointment. Experiencing vomiting, diarrhea, and painful urination

## 2023-11-25 NOTE — Telephone Encounter (Signed)
 No triage: Pt stated  I feel better. Symptoms have subsided. RN advised pt to call back to be triaged if symptoms return.

## 2023-11-25 NOTE — Patient Instructions (Signed)
  Erminio GORMAN Fuelling, thank you for joining Elsie Velma Lunger, PA-C for today's virtual visit.  While this provider is not your primary care provider (PCP), if your PCP is located in our provider database this encounter information will be shared with them immediately following your visit.   A [place] MyChart account gives you access to today's visit and all your visits, tests, and labs performed at [facility]  click here if you don't have a [facility] MyChart account or go to [email or web address]  Consent: (Patient) [name] provided verbal consent for this virtual visit at the beginning of the encounter.  Current Medications:  Current Outpatient Medications:    amLODipine  (NORVASC ) 5 MG tablet, Take 1 tablet (5 mg total) by mouth daily., Disp: 90 tablet, Rfl: 3   aspirin  81 MG EC tablet, Take 81 mg by mouth at bedtime., Disp: , Rfl:    Evolocumab  (REPATHA  SURECLICK) 140 MG/ML SOAJ, Inject 140 mg into the skin every 14 (fourteen) days., Disp: 6 mL, Rfl: 3   ezetimibe  (ZETIA ) 10 MG tablet, Take 1 tablet (10 mg total) by mouth daily., Disp: 90 tablet, Rfl: 3   levothyroxine  (SYNTHROID ) 100 MCG tablet, Take 1 tablet (100 mcg total) by mouth daily., Disp: 90 tablet, Rfl: 0   pantoprazole  (PROTONIX ) 40 MG tablet, Take 1 tablet (40 mg total) by mouth daily., Disp: 90 tablet, Rfl: 3   PARoxetine  (PAXIL ) 20 MG tablet, TAKE 1 TABLET BY MOUTH ONCE DAILY IN THE MORNING, Disp: 90 tablet, Rfl: 0   predniSONE  (DELTASONE ) 5 MG tablet, TAKE 1 TABLET BY MOUTH ONCE DAILY WITH BREAKFAST, Disp: 90 tablet, Rfl: 0   rosuvastatin  (CRESTOR ) 5 MG tablet, Take 1 tablet (5 mg total) by mouth every other day., Disp: 45 tablet, Rfl: 3  Current Facility-Administered Medications:    0.9 %  sodium chloride  infusion, 500 mL, Intravenous, Once, Armbruster, Elspeth SQUIBB, MD   Medications ordered in this encounter:  No orders of the defined types were placed in this encounter.    *If you need refills  on other medications prior to your next appointment, please contact your pharmacy*  Follow-Up: Call back or seek an in-person evaluation if the symptoms worsen or if the condition fails to improve as anticipated.  [facility] [phone]  Other Instructions Please be evaluated in person ASAP today as discussed. If your PCP is unavailable, please use the link below for an urgent care evaluation. If there is any acute worsening of symptoms, any recurrence of diarrhea or vomiting, or any decreased urine again, you need an ER assessment ASAP.   If you have been instructed to have an in-person evaluation today at a local Urgent Care facility, please use the link below. It will take you to a list of all of our available [facility], including address, phone number and hours of operation. Please do not delay care.  [facility]  If you or a family member do not have a primary care provider, use the link below to schedule a visit and establish care. When you choose a [facility] primary care physician or advanced practice provider, you gain a long-term partner in health. Find a Primary Care Provider  Learn more about [facility]'s in-office and virtual care options: [facility] - Get Care Now

## 2023-11-25 NOTE — Telephone Encounter (Signed)
 Noted! Thank you

## 2023-11-25 NOTE — Telephone Encounter (Signed)
 Reason for Triage: Pt called in to book appointment. Experiencing vomiting, diarrhea, and painful urination

## 2023-11-25 NOTE — Progress Notes (Signed)
 Virtual Visit Consent   [name], you are scheduled for a virtual visit with a [facility] provider today. Just as with appointments in the office, your consent must be obtained to participate. Your consent will be active for this visit and any virtual visit you may have with one of our providers in the next 365 days. If you have a MyChart account, a copy of this consent can be sent to you electronically.  As this is a virtual visit, video technology does not allow for your provider to perform a traditional examination. This may limit your provider's ability to fully assess your condition. If your provider identifies any concerns that need to be evaluated in person or the need to arrange testing (such as labs, EKG, etc.), we will make arrangements to do so. Although advances in technology are sophisticated, we cannot ensure that it will always work on either your end or our end. If the connection with a video visit is poor, the visit may have to be switched to a telephone visit. With either a video or telephone visit, we are not always able to ensure that we have a secure connection.  By engaging in this virtual visit, you consent to the provision of healthcare and authorize for your insurance to be billed (if applicable) for the services provided during this visit. Depending on your insurance coverage, you may receive a charge related to this service.  I need to obtain your verbal consent now. Are you willing to proceed with your visit today? CACIE GASKINS has provided verbal consent on 11/25/2023 for a virtual visit (video or telephone). Elsie Velma Lunger, [place]  Date: 11/25/2023 9:35 AM   Virtual Visit via Video Note   I, Elsie Velma Lunger, connected with  NAIYAH KLOSTERMANN  ([id number], Jun 18, 1950) on 11/24/72 at  9:30 AM EDT by a video-enabled telemedicine application and verified that I am speaking with the correct person using two identifiers.  Location: Patient: Virtual Visit  Location Patient: Home Provider: Virtual Visit Location Provider: Home Office   I discussed the limitations of evaluation and management by telemedicine and the availability of in person appointments. The patient expressed understanding and agreed to proceed.    History of Present Illness: KENSLI BOWLEY is a 73 y.o. who identifies as a female who was assigned female at birth, and is being seen today for a possible UTI. Endorses Friday PM noting nausea, vomiting and diarrhea until Saturday morning (around 11 AM). Some intermittent residual symptoms the rest of the day Saturday but stopped completely by Sunday morning.Notes she did not urinate at all that she can recall on Saturday but resumed urination yesterday around lunch time. Has continued to urinate since then, sometimes normal urination and other times much smaller amounts, associated with dysuria, urgency and frequency. Denies fever, chills, flank pain. Notes Hx compression fractures -- no change in chronic back pain. Is tolerating PO fluids now.      HPI: HPI  Problems:  Patient Active Problem List   Diagnosis Date Noted   Essential hypertension 05/01/2023   B12 deficiency 04/26/2022   Hyperglycemia 04/25/2022   Unilateral primary osteoarthritis, left hip 08/24/2021   Shoulder fracture 08/10/2020   Lumbar compression fracture (HCC) 05/11/2019   Dyslipidemia 01/26/2019   Drug-induced myopathy 01/14/2019   Haglund's deformity 01/12/2019   High risk medication use 12/04/2018   Lower esophageal ring (Schatzki) 03/03/2018   Osteoarthritis of right hip 12/02/2017   GERD (gastroesophageal reflux disease) 12/15/2015  Polyp of colon, adenomatous 10/11/2014   Vitamin D  deficiency 05/26/2014   Osteoporosis 05/26/2014   Polymyalgia rheumatica 12/01/2010   CAD (coronary artery disease) 09/09/2007   Hematuria 09/09/2007   Depression 03/20/2007   Hypothyroid 02/13/1999    Allergies:  Allergies  Allergen Reactions   Morphine  And  Codeine Nausea And Vomiting   Prochlorperazine     REACTION: nerve reaction   Simvastatin      REACTION: leg cramps   Sulfa Antibiotics    Medications:  Current Outpatient Medications:    amLODipine  (NORVASC ) 5 MG tablet, Take 1 tablet (5 mg total) by mouth daily., Disp: 90 tablet, Rfl: 3   aspirin  81 MG EC tablet, Take 81 mg by mouth at bedtime., Disp: , Rfl:    Evolocumab  (REPATHA  SURECLICK) 140 MG/ML SOAJ, Inject 140 mg into the skin every 14 (fourteen) days., Disp: 6 mL, Rfl: 3   ezetimibe  (ZETIA ) 10 MG tablet, Take 1 tablet (10 mg total) by mouth daily., Disp: 90 tablet, Rfl: 3   levothyroxine  (SYNTHROID ) 100 MCG tablet, Take 1 tablet (100 mcg total) by mouth daily., Disp: 90 tablet, Rfl: 0   pantoprazole  (PROTONIX ) 40 MG tablet, Take 1 tablet (40 mg total) by mouth daily., Disp: 90 tablet, Rfl: 3   PARoxetine  (PAXIL ) 20 MG tablet, TAKE 1 TABLET BY MOUTH ONCE DAILY IN THE MORNING, Disp: 90 tablet, Rfl: 0   predniSONE  (DELTASONE ) 5 MG tablet, TAKE 1 TABLET BY MOUTH ONCE DAILY WITH BREAKFAST, Disp: 90 tablet, Rfl: 0   rosuvastatin  (CRESTOR ) 5 MG tablet, Take 1 tablet (5 mg total) by mouth every other day., Disp: 45 tablet, Rfl: 3  Current Facility-Administered Medications:    0.9 %  sodium chloride  infusion, 500 mL, Intravenous, Once, Armbruster, Elspeth SQUIBB, MD  Observations/Objective: Patient is well-developed, well-nourished in no acute distress.  Resting comfortably  at home.  Head is normocephalic, atraumatic.  No labored breathing.  Speech is clear and coherent with logical content.  Patient is alert and oriented at baseline.   Assessment and Plan: 1. Viral gastroenteritis (Primary)  2. Oliguria  3. Dysuria  Seems symptoms started with a viral gastroenteritis leasing to some initial substantial dehydration and resulting oliguria. Now with symptoms concerning for possible UTI. Discussed need for in-person evaluation for multiple reasons -- increased risk of complicated UTI  giving age > 74, needs assessment of renal function due to oliguria and prior dehydration, etc. She agrees to be evaluated in person, preferring to contact her PCP first. Link given for all of our in-person locations for acute assessment as well.  Follow Up Instructions: I discussed the assessment and treatment plan with the patient. The patient was provided an opportunity to ask questions and all were answered. The patient agreed with the plan and demonstrated an understanding of the instructions.  A copy of instructions were sent to the patient via MyChart unless otherwise noted below.   The patient was advised to call back or seek an in-person evaluation if the symptoms worsen or if the condition fails to improve as anticipated.    Elsie Velma Lunger, PA-C

## 2023-11-27 ENCOUNTER — Ambulatory Visit (INDEPENDENT_AMBULATORY_CARE_PROVIDER_SITE_OTHER): Admitting: Family Medicine

## 2023-11-27 ENCOUNTER — Encounter: Payer: Self-pay | Admitting: Family Medicine

## 2023-11-27 VITALS — BP 112/72 | HR 77 | Temp 97.7°F | Ht 61.2 in | Wt 151.0 lb

## 2023-11-27 DIAGNOSIS — Z1211 Encounter for screening for malignant neoplasm of colon: Secondary | ICD-10-CM | POA: Diagnosis not present

## 2023-11-27 DIAGNOSIS — E559 Vitamin D deficiency, unspecified: Secondary | ICD-10-CM

## 2023-11-27 DIAGNOSIS — M810 Age-related osteoporosis without current pathological fracture: Secondary | ICD-10-CM

## 2023-11-27 DIAGNOSIS — Z131 Encounter for screening for diabetes mellitus: Secondary | ICD-10-CM

## 2023-11-27 DIAGNOSIS — Z1212 Encounter for screening for malignant neoplasm of rectum: Secondary | ICD-10-CM

## 2023-11-27 DIAGNOSIS — E538 Deficiency of other specified B group vitamins: Secondary | ICD-10-CM

## 2023-11-27 DIAGNOSIS — Z0000 Encounter for general adult medical examination without abnormal findings: Secondary | ICD-10-CM

## 2023-11-27 DIAGNOSIS — R739 Hyperglycemia, unspecified: Secondary | ICD-10-CM

## 2023-11-27 DIAGNOSIS — E034 Atrophy of thyroid (acquired): Secondary | ICD-10-CM

## 2023-11-27 DIAGNOSIS — E785 Hyperlipidemia, unspecified: Secondary | ICD-10-CM | POA: Diagnosis not present

## 2023-11-27 LAB — LIPID PANEL
Cholesterol: 112 mg/dL (ref 0–200)
HDL: 45.9 mg/dL (ref >39.00)
LDL Cholesterol: 50 mg/dL (ref 0–99)
NonHDL: 66.44
Total CHOL/HDL Ratio: 2
Triglycerides: 84 mg/dL (ref 0.0–149.0)
VLDL: 16.8 mg/dL (ref 0.0–40.0)

## 2023-11-27 LAB — COMPREHENSIVE METABOLIC PANEL WITH GFR
ALT: 24 U/L (ref 0–35)
AST: 32 U/L (ref 0–37)
Albumin: 3.8 g/dL (ref 3.5–5.2)
Alkaline Phosphatase: 57 U/L (ref 39–117)
BUN: 15 mg/dL (ref 6–23)
CO2: 28 meq/L (ref 19–32)
Calcium: 8.1 mg/dL — ABNORMAL LOW (ref 8.4–10.5)
Chloride: 105 meq/L (ref 96–112)
Creatinine, Ser: 0.84 mg/dL (ref 0.40–1.20)
GFR: 68.53 mL/min (ref >60.00)
Glucose, Bld: 93 mg/dL (ref 70–99)
Potassium: 3.2 meq/L — ABNORMAL LOW (ref 3.5–5.1)
Sodium: 142 meq/L (ref 135–145)
Total Bilirubin: 0.5 mg/dL (ref 0.2–1.2)
Total Protein: 6.1 g/dL (ref 6.0–8.3)

## 2023-11-27 LAB — HEMOGLOBIN A1C: Hgb A1c MFr Bld: 6.1 % (ref 4.6–6.5)

## 2023-11-27 LAB — VITAMIN B12: Vitamin B-12: 284 pg/mL (ref 211–911)

## 2023-11-27 LAB — CBC WITH DIFFERENTIAL/PLATELET
Basophils Absolute: 0.1 K/uL (ref 0.0–0.1)
Basophils Relative: 0.8 % (ref 0.0–3.0)
Eosinophils Absolute: 0.2 K/uL (ref 0.0–0.7)
Eosinophils Relative: 2.9 % (ref 0.0–5.0)
HCT: 38.4 % (ref 36.0–46.0)
Hemoglobin: 12.6 g/dL (ref 12.0–15.0)
Lymphocytes Relative: 24.1 % (ref 12.0–46.0)
Lymphs Abs: 1.5 K/uL (ref 0.7–4.0)
MCHC: 32.9 g/dL (ref 30.0–36.0)
MCV: 91.3 fl (ref 78.0–100.0)
Monocytes Absolute: 0.5 K/uL (ref 0.1–1.0)
Monocytes Relative: 7.9 % (ref 3.0–12.0)
Neutro Abs: 3.9 K/uL (ref 1.4–7.7)
Neutrophils Relative %: 64.3 % (ref 43.0–77.0)
Platelets: 191 K/uL (ref 150.0–400.0)
RBC: 4.21 Mil/uL (ref 3.87–5.11)
RDW: 14 % (ref 11.5–15.5)
WBC: 6.1 K/uL (ref 4.0–10.5)

## 2023-11-27 LAB — TSH: TSH: 0.85 u[IU]/mL (ref 0.35–5.50)

## 2023-11-27 LAB — VITAMIN D 25 HYDROXY (VIT D DEFICIENCY, FRACTURES): VITD: 22.06 ng/mL — ABNORMAL LOW (ref 30.00–100.00)

## 2023-11-27 MED ORDER — PROMETHAZINE HCL 25 MG RE SUPP: 25.0000 mg | Freq: Four times a day (QID) | RECTAL | 0 refills | Status: DC | PRN

## 2023-11-27 NOTE — Progress Notes (Addendum)
 Phone [phone]   Subjective:  [name] presents today for their annual physical. Chief complaint-noted.   See problem oriented charting- ROS- full  review of systems was completed and negative except for topics noted under acute/chronic concerns   The following were reviewed and entered/updated in epic: Past Medical History:  Diagnosis Date   Allergy 1980   Anxiety    Arthritis    CAD (coronary artery disease)    Cataract    Depression    DISORDER, MENOPAUSAL NOS 03/20/2007   Qualifier: Diagnosis of  By: Mavis MD, John E    GERD (gastroesophageal reflux disease)    Hyperlipidemia    Hypothyroidism    MYOCARDIAL INFARCTION, HX OF 09/09/2007   Qualifier: Diagnosis of  By: Tammie MD, Bruce     Osteoporosis    Polymyalgia rheumatica    [name] Active Problem List   Diagnosis Date Noted   Polymyalgia rheumatica 12/01/2010    Priority: High   CAD (coronary artery disease) 09/09/2007    Priority: High   Essential hypertension 05/01/2023    Priority: Medium    B12 deficiency 04/26/2022    Priority: Medium    Hyperglycemia 04/25/2022    Priority: Medium    Dyslipidemia 01/26/2019    Priority: Medium    Drug-induced myopathy 01/14/2019    Priority: Medium    High risk medication use 12/04/2018    Priority: Medium    Lower esophageal ring (Schatzki) 03/03/2018    Priority: Medium    Osteoarthritis of right hip 12/02/2017    Priority: Medium    GERD (gastroesophageal reflux disease) 12/15/2015    Priority: Medium    Vitamin D  deficiency 05/26/2014    Priority: Medium    Osteoporosis 05/26/2014    Priority: Medium    Depression 03/20/2007    Priority: Medium    Hypothyroid 02/13/1999    Priority: Medium    Polyp of colon, adenomatous 10/11/2014    Priority: Low   Hematuria 09/09/2007    Priority: Low   Unilateral primary osteoarthritis, left hip 08/24/2021    Priority: 1.   Shoulder fracture 08/10/2020    Priority: 1.   Lumbar compression fracture (HCC)  05/11/2019    Priority: 1.   Haglund's deformity 01/12/2019    Priority: 1.   Past Surgical History:  Procedure Laterality Date   CATARACT EXTRACTION     CESAREAN SECTION     CHOLECYSTECTOMY N/A 11/24/2012   Procedure: LAPAROSCOPIC CHOLECYSTECTOMY;  Surgeon: Lynda Leos, MD;  Location: [facility] OR;  Service: General;  Laterality: N/A;   COSMETIC SURGERY     EYE SURGERY     eye lids lifted   FRACTURE SURGERY  2022   PTCA     REVERSE SHOULDER ARTHROPLASTY Right 08/12/2020   Procedure: REVERSE SHOULDER ARTHROPLASTY;  Surgeon: Sharl Selinda Dover, MD;  Location[facility];  Service: Orthopedics;  Laterality: Right;   TUBAL LIGATION  1980    Family History  Problem Relation Age of Onset   Hypertension Father    Heart disease Father        Mi age 76, 73, 38 and 48.   Cancer Father    Hyperlipidemia Father    Colon cancer Maternal Uncle    Hypertension Mother    Cancer Mother    Hyperlipidemia Mother    Arthritis Mother    Pulmonary fibrosis Sister    Depression Sister    Epilepsy Son    Myasthenia gravis Son    ADD / ADHD Son  Rheum arthritis Niece    Lupus Niece    Rheum arthritis Niece     Medications- reviewed and updated Current Outpatient Medications  Medication Sig Dispense Refill   amLODipine  (NORVASC ) 5 MG tablet Take 1 tablet (5 mg total) by mouth daily. 90 tablet 3   aspirin  81 MG EC tablet Take 81 mg by mouth at bedtime.     Evolocumab  (REPATHA  SURECLICK) 140 MG/ML SOAJ Inject 140 mg into the skin every 14 (fourteen) days. 6 mL 3   ezetimibe  (ZETIA ) 10 MG tablet Take 1 tablet (10 mg total) by mouth daily. 90 tablet 3   levothyroxine  (SYNTHROID ) 100 MCG tablet Take 1 tablet (100 mcg total) by mouth daily. 90 tablet 0   pantoprazole  (PROTONIX ) 40 MG tablet Take 1 tablet (40 mg total) by mouth daily. 90 tablet 3   PARoxetine  (PAXIL ) 20 MG tablet TAKE 1 TABLET BY MOUTH ONCE DAILY IN THE MORNING 90 tablet 0   predniSONE  (DELTASONE ) 5 MG tablet TAKE 1 TABLET BY MOUTH  ONCE DAILY WITH BREAKFAST 90 tablet 0   rosuvastatin  (CRESTOR ) 5 MG tablet Take 1 tablet (5 mg total) by mouth every other day. 45 tablet 3   Current Facility-Administered Medications  Medication Dose Route Frequency Provider Last Rate Last Admin   0.9 %  sodium chloride  infusion  500 mL Intravenous Once Armbruster, Elspeth SQUIBB, MD        Allergies-reviewed and updated Allergies  Allergen Reactions   Morphine  And Codeine Nausea And Vomiting   Prochlorperazine     REACTION: nerve reaction   Simvastatin      REACTION: leg cramps   Sulfa Antibiotics     Social History   Social History Narrative   Husband and 2 adult children live at home. No grandkids. Son with epilepsy.      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     Objective  Objective:  BP 112/72 (BP Location: Left Arm, [name] Position: Sitting, Cuff Size: Normal)   Pulse 77   Temp 97.7 F (36.5 C) (Temporal)   Ht 5' 1.2 (1.554 m)   Wt 151 lb (68.5 kg)   SpO2 95%   BMI 28.35 kg/m  Gen: NAD, resting comfortably HEENT: Mucous membranes are moist. Oropharynx normal. Cerumen both ears Neck: no thyromegaly CV: RRR no murmurs rubs or gallops Lungs: CTAB no crackles, wheeze, rhonchi Abdomen: soft/nontender/nondistended/normal bowel sounds. No rebound or guarding.  Ext: no edema Skin: warm, dry Neuro: grossly normal, moves all extremities, PERRLA   Assessment and Plan   73 y.o. female presenting for annual physical.  Health Maintenance counseling: 1. Anticipatory guidance: [name] counseled regarding regular dental exams -q6 months, eye exams - yearly,  avoiding smoking and second hand smoke , limiting alcohol to 1 beverage per day- doesn't drink , no illicit drugs .   2. Risk factor reduction:  Advised [name] of need for regular exercise and diet rich and fruits and vegetables to reduce risk of heart attack and stroke.  Exercise-tough due to joint pain.  Diet/weight management-Down 6 pounds from last physical-  reports a lot of this was due to gastroenterology illness though- diarrhea and vomiting.  Wt Readings from Last 3 Encounters:  11/27/23 151 lb (68.5 kg)  10/07/23 158 lb 9.6 oz (71.9 kg)  08/21/23 155 lb 12.8 oz (70.7 kg)  3. Immunizations/screenings/ancillary studies-up-to-date Immunization History  Administered Date(s) Administered    sv, Bivalent, Protein Subunit Rsvpref,pf [place]) 11/16/2021   Fluad Quad(high Dose 65+)  11/10/2019, 11/21/2020, 11/28/2021, 10/28/2023   INFLUENZA, HIGH DOSE SEASONAL PF 10/30/2016, 12/02/2017, 11/08/2018, 10/26/2022   Influenza Whole 11/09/2008   Influenza-Unspecified 11/08/2018   Moderna Covid-19 Fall Seasonal Vaccine 59yrs & older 10/26/2022   PFIZER Comirnaty(Gray Top)Covid-19 Tri-Sucrose Vaccine 11/16/2021   PFIZER(Purple Top)SARS-COV-2 Vaccination 02/26/2019, 03/20/2019, 11/28/2019   PNEUMOCOCCAL CONJUGATE-20 06/21/2020   Pfizer Covid-19 Vaccine Bivalent Booster 61yrs & up 11/21/2020, 07/22/2021   Pneumococcal Conjugate-13 10/11/2014   Pneumococcal Polysaccharide-23 12/15/2015   Td 02/12/2006   Td (Adult), 2 Lf Tetanus Toxid, Preservative Free 02/12/2006   Tdap 04/26/2017   Unspecified SARS-COV-2 Vaccination 10/28/2023   Zoster Recombinant(Shingrix) 07/18/2021, 09/19/2021   Zoster, Live 09/19/2010  4. Cervical cancer screening- past age based screening recommendations  5. Breast cancer screening-  breast exam - self exams- and mammogram 08/13/23 with 1 year repeat 6. Colon cancer screening -  cologuard 10/31/20 with 3 year repeat- she has had precancerous polyp so this is not ideal but she has declined colonoscopy due to poor recovery after sedation for shoulder surgery. I still think cologuard is better option than absolutely no screening because would consider colonoscopy heavily if positive 7. Skin cancer screening- no dermatology. advised regular sunscreen use. Denies worrisome, changing, or new skin lesions.  8. Birth control/STD  check-postmenopausal monogamous 9. Osteoporosis screening at 65-follows with rheumatology 10. Smoking associated screening -never smoker  Status of chronic or acute concerns   #recoverying from gastroenterology illness over weekend- last diarrhea on Saturday -phenergan  suppositories have helped in past- because has had to go to ER in past- asking for refill. Has tolerated this despite prochlorperazine reaction in past. Zofran  doesn't work for her  # CAD-follows with Dr. Lavona #hyperlipidemia with statin induced myopathy  S: Medication:Aspirin  81 mg, Repatha  140 mg every 2 weeks, Zetia  10 mg added March 2025 with rosuvastatin  5 mg every other day added in September 2025 -Most recent stress testing 04/13/2021 with Dr. Lavona -zetia  intolerance in past but doing ok  -no chest pain or shortness of breath  Lab Results  Component Value Date   CHOL 189 08/14/2023   HDL 79 08/14/2023   LDLCALC 99 08/14/2023   LDLDIRECT 176.0 12/15/2015   TRIG 58 08/14/2023   CHOLHDL 2.4 08/14/2023  A/P: coronary artery disease asymptomatic - continue current medications Lipids hopefullyimprove-D only on statin about a month plus the Zetia  and Repatha - check today but could still have some further improvements  #hypertension S: medication: Amlodipine  5 mg started by cardiology in 2025 A/P: well controlled continue current medications   # Polymyalgia rheumatica-follows with Dr. Dolphus S: Medication: Chronic prednisone -has been incredibly challenging to wean A/P: ongoing long term need for prednisone - continue with rheumatology even though not ideal for bone density    #hypothyroidism S: compliant On thyroid  medication-levothyroxine  100 mcg A/P:hopefully stable- update tsh today. Continue current meds for now    # Depression S: Medication:Paxil  20 mg daily, trazodone  as needed for sleep - not needing much    11/27/2023    9:17 AM 05/27/2023    9:54 AM 04/02/2023    3:01 PM  Depression screen PHQ  2/9  Decreased Interest 0 0 0  Down, Depressed, Hopeless 0 0 0  PHQ - 2 Score 0 0 0  Altered sleeping 0 0   Tired, decreased energy 3 0   Change in appetite 0 0   Feeling bad or failure about yourself  0 0   Trouble concentrating 0 0   Moving slowly or fidgety/restless 0 0  Suicidal thoughts 0 0   PHQ-9 Score 3 0   Difficult doing work/chores Not difficult at all Not difficult at all    A/P: doing well full remission- continue current medications    # GERD S:Medication: pantoprazole  40 mg  B12 levels related to PPI use: Low normal in the past  A/P: doing well on this- Chronic Care Management (CCM) e   # Osteoporosis-followed by rheumatology  #Vitamin D  deficiency S: Medication: Vitamin D  1000 units in gummies at least, Reclast  yearly planned in 2024- had one April 2025  A/P: osteoporosis  roughtly stable with some #s improved and some worsened but mild- continue to monitor   # Hyperglycemia/insulin resistance/prediabetes- a1c of 6.1 in 2014 S:  Medication: none Lab Results  Component Value Date   HGBA1C 5.9 (H) 05/16/2023   HGBA1C 6.0 04/25/2022   HGBA1C 6.0 06/21/2020  A/P: ongoing prediabetes likely related to chronic prednisone  but thankfully #s have not trended up = check today  Recommended follow up: Return in about 6 months (around 05/27/2024) for followup or sooner if needed.Schedule b4 you leave. Future Appointments  Date Time Provider Department Center  12/12/2023  1:50 PM Cheryl Waddell HERO, PA-C [facility] None  04/07/2024  2:20 PM [facility] ANNUAL WELLNESS VISIT 1 [facility]   Lab/Order associations:  fasting   ICD-10-CM   1. Preventative health care  Z00.00     2. Encounter for colorectal cancer screening  Z12.11 Cologuard   Z12.12     3. Hypothyroidism due to acquired atrophy of thyroid   E03.4 TSH    4. Vitamin D  deficiency  E55.9 VITAMIN D  25 Hydroxy (Vit-D Deficiency, Fractures)    5. Age-related osteoporosis without current pathological fracture   M81.0     6. Dyslipidemia  E78.5 Comprehensive metabolic panel with GFR    CBC with Differential/Platelet    Lipid panel    7. Hyperglycemia  R73.9 Hemoglobin A1c    8. B12 deficiency  E53.8 Vitamin B12    9. Screening for diabetes mellitus  Z13.1 Hemoglobin A1c      No orders of the defined types were placed in this encounter.   Return precautions advised.  Garnette Lukes, MD

## 2023-11-27 NOTE — Addendum Note (Signed)
 Addended by: KATRINKA GARNETTE KIDD on: 11/27/2023 09:55 AM   Modules accepted: Orders

## 2023-11-27 NOTE — Patient Instructions (Addendum)
 Health Maintenance Due  Topic Date Due   Fecal DNA (Cologuard)  11/01/2023  please let us  know if you have not received your Cologuard within 3 weeks-please complete after you receive  Please stop by lab before you go If you have mychart- we will send your results within 3 business days of us  receiving them.  If you do not have mychart- we will call you about results within 5 business days of us  receiving them.  *please also note that you will see labs on mychart as soon as they post. I will later go in and write notes on them- will say notes from Dr. Katrinka   Recommended follow up: Return in about 6 months (around 05/27/2024) for followup or sooner if needed.Schedule b4 you leave. Or at absolute latest yearly physical

## 2023-11-28 ENCOUNTER — Telehealth: Payer: Self-pay

## 2023-11-28 ENCOUNTER — Other Ambulatory Visit (HOSPITAL_COMMUNITY): Payer: Self-pay

## 2023-11-28 ENCOUNTER — Telehealth: Payer: Self-pay | Admitting: Family Medicine

## 2023-11-28 ENCOUNTER — Ambulatory Visit: Payer: Self-pay | Admitting: Family Medicine

## 2023-11-28 ENCOUNTER — Encounter: Payer: Self-pay | Admitting: Cardiology

## 2023-11-28 MED ORDER — VITAMIN D (ERGOCALCIFEROL) 1.25 MG (50000 UNIT) PO CAPS: 50000.0000 [IU] | ORAL_CAPSULE | ORAL | 1 refills | Status: DC

## 2023-11-28 NOTE — Progress Notes (Signed)
 Office Visit Note  Patient: [name]             Date of Birth: 1950-05-03           MRN: [id number]             PCP: Katrinka Garnette KIDD, MD Referring: Katrinka Garnette KIDD, MD Visit Date: 12/12/2023 Occupation: Data Unavailable  Subjective:  Recent gout flare   History of Present Illness: [name] is a 73 y.o. female with history of osteoporosis.  Patient received the second IV Reclast  infusion on 06/06/2023.  She has tolerated Reclast  without any side effects.  She denies any falls or fractures.  She had updated bone density done 08/09/2023. Patient states that on 10/25/2023 she was diagnosed with gout affecting the right wrist.  Patient states that she woke up with pain and swelling in the right wrist to the point that she thought she fractured the wrist while sleeping.  Patient was evaluated at the walk-in clinic at [facility].  According to the patient she had x-rays which ruled out a fracture and she was diagnosed with gout.  She did not have a uric acid level obtained at that time.  She was prescribed colchicine and Celebrex which alleviated her symptoms.  She has not had any recurrence.  This was the first flare of gout that she has experienced. She continues to have chronic pain in her lower back.  She has not yet followed up with Dr. Georgina.  Patient had a left trochanteric bursa cortisone injection performed on 08/21/2023 which reported relief for a few weeks but patient states most of her discomfort is coming from her lower back.   Activities of Daily Living:  Patient reports morning stiffness for  30 minutes  Patient denies nocturnal pain.  Difficulty dressing/grooming: Denies Difficulty climbing stairs: Reports Difficulty getting out of chair: Reports Difficulty using hands for taps, buttons, cutlery, and/or writing: Denies  Review of Systems  Constitutional:  Negative for fatigue.  HENT:  Negative for mouth sores, mouth dryness and nose dryness.   Eyes:   Negative for pain, visual disturbance and dryness.  Respiratory:  Negative for cough, hemoptysis, shortness of breath and difficulty breathing.   Cardiovascular:  Negative for chest pain, palpitations, hypertension and swelling in legs/feet.  Gastrointestinal:  Negative for blood in stool, constipation and diarrhea.  Endocrine: Negative for increased urination.  Genitourinary:  Negative for painful urination.  Musculoskeletal:  Positive for joint pain, joint pain and morning stiffness. Negative for joint swelling, myalgias, muscle weakness, muscle tenderness and myalgias.  Skin:  Negative for color change, pallor, rash, hair loss, nodules/bumps, skin tightness, ulcers and sensitivity to sunlight.  Allergic/Immunologic: Negative for susceptible to infections.  Neurological:  Negative for dizziness, numbness, headaches and weakness.  Hematological:  Negative for swollen glands.  Psychiatric/Behavioral:  Negative for depressed mood and sleep disturbance. The patient is not nervous/anxious.     PMFS History:  Patient Active Problem List   Diagnosis Date Noted   Essential hypertension 05/01/2023   B12 deficiency 04/26/2022   Hyperglycemia 04/25/2022   Unilateral primary osteoarthritis, left hip 08/24/2021   Shoulder fracture 08/10/2020   Lumbar compression fracture (HCC) 05/11/2019   Dyslipidemia 01/26/2019   Drug-induced myopathy 01/14/2019   Haglund's deformity 01/12/2019   High risk medication use 12/04/2018   Lower esophageal ring (Schatzki) 03/03/2018   Osteoarthritis of right hip 12/02/2017   GERD (gastroesophageal reflux disease) 12/15/2015   Polyp of colon, adenomatous 10/11/2014  Vitamin D  deficiency 05/26/2014   Osteoporosis 05/26/2014   Polymyalgia rheumatica 12/01/2010   CAD (coronary artery disease) 09/09/2007   Hematuria 09/09/2007   Depression 03/20/2007   Hypothyroid 02/13/1999    Past Medical History:  Diagnosis Date   Allergy 1980   Anxiety    Arthritis     CAD (coronary artery disease)    Cataract    Depression    DISORDER, MENOPAUSAL NOS 03/20/2007   Qualifier: Diagnosis of  By: Mavis MD, Norleen BRAVO    GERD (gastroesophageal reflux disease)    Hyperlipidemia    Hypothyroidism    MYOCARDIAL INFARCTION, HX OF 09/09/2007   Qualifier: Diagnosis of  By: Tammie MD, Bruce     Osteoporosis    Polymyalgia rheumatica     Family History  Problem Relation Age of Onset   Hypertension Father    Heart disease Father        Mi age 68, 51, 6 and 32.   Cancer Father    Hyperlipidemia Father    Colon cancer Maternal Uncle    Hypertension Mother    Cancer Mother    Hyperlipidemia Mother    Arthritis Mother    Pulmonary fibrosis Sister    Depression Sister    Epilepsy Son    Myasthenia gravis Son    ADD / ADHD Son    Rheum arthritis Niece    Lupus Niece    Rheum arthritis Niece    Past Surgical History:  Procedure Laterality Date   CATARACT EXTRACTION     CESAREAN SECTION     CHOLECYSTECTOMY N/A 11/24/2012   Procedure: LAPAROSCOPIC CHOLECYSTECTOMY;  Surgeon: Lynda Leos, MD;  Location: [facility] OR;  Service: General;  Laterality: N/A;   COSMETIC SURGERY     EYE SURGERY     eye lids lifted   FRACTURE SURGERY  2022   PTCA     REVERSE SHOULDER ARTHROPLASTY Right 08/12/2020   Procedure: REVERSE SHOULDER ARTHROPLASTY;  Surgeon: Sharl Selinda Dover, MD;  Location: [facility] OR;  Service: Orthopedics;  Laterality: Right;   TUBAL LIGATION  1980   Social History   Tobacco Use   Smoking status: Never    Passive exposure: Past   Smokeless tobacco: Never  Vaping Use   Vaping status: Never Used  Substance Use Topics   Alcohol use: No   Drug use: No   Social History   Social History Narrative   Husband and 2 adult children live at home. No grandkids. Son with epilepsy.      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       Immunization History  Administered Date(s) Administered    sv, Bivalent, Protein Subunit Rsvpref,pf (Abrysvo)  11/16/2021   Fluad Quad(high Dose 65+) 11/10/2019, 11/21/2020, 11/28/2021, 10/28/2023   INFLUENZA, HIGH DOSE SEASONAL PF 10/30/2016, 12/02/2017, 11/08/2018, 10/26/2022   Influenza Whole 11/09/2008   Influenza-Unspecified 11/08/2018   Moderna Covid-19 Fall Seasonal Vaccine 8yrs & older 10/26/2022   PFIZER Comirnaty(Gray Top)Covid-19 Tri-Sucrose Vaccine 11/16/2021   PFIZER(Purple Top)SARS-COV-2 Vaccination 02/26/2019, 03/20/2019, 11/28/2019   PNEUMOCOCCAL CONJUGATE-20 06/21/2020   Pfizer Covid-19 Vaccine Bivalent Booster 77yrs & up 11/21/2020, 07/22/2021   Pneumococcal Conjugate-13 10/11/2014   Pneumococcal Polysaccharide-23 12/15/2015   Td 02/12/2006   Td (Adult), 2 Lf Tetanus Toxid, Preservative Free 02/12/2006   Tdap 04/26/2017   Unspecified SARS-COV-2 Vaccination 10/28/2023   Zoster Recombinant(Shingrix) 07/18/2021, 09/19/2021   Zoster, Live 09/19/2010     Objective: Vital Signs: BP 121/80   Pulse 78  Temp 98.9 F (37.2 C)   Resp 14   Ht 5' 1.5 (1.562 m)   Wt 153 lb 6.4 oz (69.6 kg)   BMI 28.52 kg/m    Physical Exam Vitals and nursing note reviewed.  Constitutional:      Appearance: She is well-developed.  HENT:     Head: Normocephalic and atraumatic.  Eyes:     Conjunctiva/sclera: Conjunctivae normal.  Cardiovascular:     Rate and Rhythm: Normal rate and regular rhythm.     Heart sounds: Normal heart sounds.  Pulmonary:     Effort: Pulmonary effort is normal.     Breath sounds: Normal breath sounds.  Abdominal:     General: Bowel sounds are normal.     Palpations: Abdomen is soft.  Musculoskeletal:     Cervical back: Normal range of motion.  Lymphadenopathy:     Cervical: No cervical adenopathy.  Skin:    General: Skin is warm and dry.     Capillary Refill: Capillary refill takes less than 2 seconds.  Neurological:     Mental Status: She is alert and oriented to person, place, and time.  Psychiatric:        Behavior: Behavior normal.       Musculoskeletal Exam: C-spine has limited ROM.  Thoracic kyphosis noted.   Painful ROM of the the lumbar spine.  Right shoulder replacement has limited abduction.  Left shoulder has good range of motion.  PIP and DIP thickening consistent with osteoarthritis of both hands.  Complete fist formation bilaterally.  Discomfort range of motion of the right hip.  Knee joints have good range of motion no warmth or effusion.  Ankle joints have good range of motion no tenderness or joint swelling.  No evidence of Achilles tendinitis or plantar fasciitis.   CDAI Exam: CDAI Score: -- Patient Global: --; Provider Global: -- Swollen: --; Tender: -- Joint Exam 12/12/2023   No joint exam has been documented for this visit   There is currently no information documented on the homunculus. Go to the Rheumatology activity and complete the homunculus joint exam.  Investigation: No additional findings.  Imaging: No results found.  Recent Labs: Lab Results  Component Value Date   WBC 6.1 11/27/2023   HGB 12.6 11/27/2023   PLT 191.0 11/27/2023   NA 142 11/27/2023   K 3.2 (L) 11/27/2023   CL 105 11/27/2023   CO2 28 11/27/2023   GLUCOSE 93 11/27/2023   BUN 15 11/27/2023   CREATININE 0.84 11/27/2023   BILITOT 0.5 11/27/2023   ALKPHOS 57 11/27/2023   AST 32 11/27/2023   ALT 24 11/27/2023   PROT 6.1 11/27/2023   ALBUMIN 3.8 11/27/2023   CALCIUM  8.1 (L) 11/27/2023   GFRAA 75 04/22/2020   QFTBGOLDPLUS NEGATIVE 08/12/2018    Speciality Comments: Forteo  October 2021-February 2022 restarted June 2022 (missed 3 weeks due to fracture)  Should be completing Forteo  in December 2023 Reclast  May 07, 2022, June 06, 2023  Procedures:  No procedures performed Allergies: Morphine , Morphine  and codeine, Prochlorperazine, Simvastatin , and Sulfa antibiotics    Assessment / Plan:     Visit Diagnoses: Age-related osteoporosis without current pathological fracture: DEXA 08/07/2021:Bilateral FN BMD 0.619  T-score -2.1.  L1 compression fracture.  Long-term systemic prednisone  use-prednisone  5 mg daily currently. Forteo  started October 2021 (gap February 22 till June 22).Completed Forteo  in Jan 2024.  IV Reclast  started 05/07/22 and 06/06/2023.  She tolerated Reclast  without any side effects.  No recent falls or fractures. DEXA  updated on 08/09/2023: Left femoral neck BMD 0.667 with T-score -2.3. 8% improvement in BMD of AP spine.  3% improvement in right total femur.  -3% change in BMD of left total femur. Recommend repeating DEXA in June 2027.   Medication monitoring encounter -IV reclast  started on 05/07/22 and second dose received on 06/06/2023.  History of compression fracture of spine - Previous L1 compression fracture with 60% vertebral body height loss.  Vitamin D  deficiency: Vitamin D  was low at 22.06 on 11/27/2023.  She has been started on vitamin D  50,000 units once weekly.  Polymyalgia rheumatica - DXd 2015 by another rheumatologist.  Patient remains on prednisone  5 mg daily and has been unable to taper.  No signs or symptoms of a PMR flare.  No muscular weakness.  Long term (current) use of systemic steroids - Patient remains on prednisone  5 mg daily and has been unable to taper. Hemoglobin A1c was 6.1% on 11/27/2023.  S/P shoulder replacement, right: Limited abduction and internal rotation.  Primary osteoarthritis of both hands: She has PIP and DIP thickening consistent with osteoarthritis of both hands.  No synovitis noted on exam.  Discussed the importance of joint protection and muscle strengthening.  Pain in left hip: Left trochanteric bursa cortisone injection on 08/21/2023  Primary osteoarthritis of right hip: Intermittent discomfort.   Spondylosis of lumbar spine - Dr. Eldonna: Chronic pain.  She has not yet followed up with Dr. Georgina.   Primary osteoarthritis of both feet: Good range of motion of both ankle joints with no tenderness or joint swelling.  History of gout:  Patient woke up the morning of 10/25/2023 with severe pain and swelling affecting the right wrist.  She was evaluated at the walk-in clinic at [facility] and was diagnosed with gout.  She had an x-ray to rule out fracture but did not have a uric acid level obtained.  She was treated with a course of Celebrex and colchicine which helped to resolve her symptoms.  She has not had any recurrence.  Offered to obtain a uric acid level today but she would like to hold off at this time.  Discussed that if she has a recurrence of gout we may need to consider prophylactic treatment with allopurinol once a diagnosis has been fully confirmed.   Other medical conditions are listed as follows:   Coronary artery disease involving native coronary artery of native heart without angina pectoris  Vitamin B12 deficiency  History of hypothyroidism  History of depression  Adenomatous polyp of colon, unspecified part of colon  History of gastroesophageal reflux (GERD)  High risk medication use - Discontinued methotrexate  in April 2022 as she did not notice any benefit.    Orders: No orders of the defined types were placed in this encounter.  No orders of the defined types were placed in this encounter.    Follow-Up Instructions: Return in about 6 months (around 06/11/2024) for Osteoporosis, Osteoarthritis.   Waddell CHRISTELLA Craze, PA-C  Note - This record has been created using Dragon software.  Chart creation errors have been sought, but may not always  have been located. Such creation errors do not reflect on  the standard of medical care.

## 2023-11-28 NOTE — Telephone Encounter (Signed)
 Pharmacy Patient Advocate Encounter   Received notification from Onbase that prior authorization for Promethazine  HCl 25MG  suppositories  is required/requested.   Insurance verification completed.   The patient is insured through [employer].   Per test claim: PA required; PA submitted to above mentioned insurance via Latent Key/confirmation #/EOC [id number] Status is pending

## 2023-11-28 NOTE — Telephone Encounter (Signed)
 Please see patient message and advise.   Copied from CRM #[id number]. Topic: Clinical - Medication Prior Auth >> Nov 28, 2023 11:00 AM Thersia BROCKS wrote: Reason for CRM:  megan blue medicare  Promethazine  HCl 25MG  suppositories - was denied for coverage at this time  isnt being prescribed for FDA label or medical accept use   Still have option to appeal decision can do by phone or fax

## 2023-11-28 NOTE — Telephone Encounter (Signed)
 She can try GoodRx but will not go to her deductible for medicines

## 2023-11-28 NOTE — Telephone Encounter (Signed)
 Pharmacy Patient Advocate Encounter  Received notification from [facility] that Prior Authorization for  Promethazine  HCl 25MG  suppositories  has been DENIED.  See denial reason below. No denial letter attached in CMM. Will attach denial letter to Media tab once received.   PA #/Case ID/Reference #: [id number]

## 2023-12-01 ENCOUNTER — Encounter: Payer: Self-pay | Admitting: Family Medicine

## 2023-12-04 ENCOUNTER — Other Ambulatory Visit (HOSPITAL_COMMUNITY): Payer: Self-pay

## 2023-12-05 ENCOUNTER — Ambulatory Visit: Admitting: Orthopedic Surgery

## 2023-12-09 ENCOUNTER — Other Ambulatory Visit: Payer: Self-pay | Admitting: Medical Genetics

## 2023-12-09 ENCOUNTER — Other Ambulatory Visit: Payer: Self-pay

## 2023-12-09 DIAGNOSIS — Z006 Encounter for examination for normal comparison and control in clinical research program: Secondary | ICD-10-CM

## 2023-12-09 DIAGNOSIS — K080 Exfoliation of teeth due to systemic causes: Secondary | ICD-10-CM | POA: Diagnosis not present

## 2023-12-09 MED ORDER — PROMETHAZINE HCL 25 MG RE SUPP: 25.0000 mg | Freq: Four times a day (QID) | RECTAL | 0 refills | Status: DC | PRN

## 2023-12-10 ENCOUNTER — Telehealth: Payer: Self-pay

## 2023-12-10 ENCOUNTER — Other Ambulatory Visit (HOSPITAL_COMMUNITY): Payer: Self-pay

## 2023-12-10 NOTE — Telephone Encounter (Signed)
 Pharmacy Patient Advocate Encounter   Received notification from Onbase that prior authorization for Promethazine  HCl 25MG  suppositories is required/requested.   Insurance verification completed.   The patient is insured through [facility].   Per test claim: PA required; PA submitted to above mentioned insurance via Latent Key/confirmation #/EOC [id number] Status is pending

## 2023-12-11 NOTE — Telephone Encounter (Signed)
 Pharmacy Patient Advocate Encounter  Received notification from [facility] that Prior Authorization for  Promethazine  HCl 25MG  suppositories has been CANCELLED due to Duplicate request.   PA #/Case ID/Reference #: [id number]

## 2023-12-12 ENCOUNTER — Encounter: Payer: Self-pay | Admitting: Physician Assistant

## 2023-12-12 ENCOUNTER — Encounter: Payer: Self-pay | Admitting: Family Medicine

## 2023-12-12 ENCOUNTER — Ambulatory Visit: Attending: Physician Assistant | Admitting: Physician Assistant

## 2023-12-12 VITALS — BP 121/80 | HR 78 | Temp 98.9°F | Resp 14 | Ht 61.5 in | Wt 153.4 lb

## 2023-12-12 DIAGNOSIS — D126 Benign neoplasm of colon, unspecified: Secondary | ICD-10-CM

## 2023-12-12 DIAGNOSIS — Z96611 Presence of right artificial shoulder joint: Secondary | ICD-10-CM

## 2023-12-12 DIAGNOSIS — Z7952 Long term (current) use of systemic steroids: Secondary | ICD-10-CM

## 2023-12-12 DIAGNOSIS — E559 Vitamin D deficiency, unspecified: Secondary | ICD-10-CM | POA: Diagnosis not present

## 2023-12-12 DIAGNOSIS — M19042 Primary osteoarthritis, left hand: Secondary | ICD-10-CM

## 2023-12-12 DIAGNOSIS — Z8739 Personal history of other diseases of the musculoskeletal system and connective tissue: Secondary | ICD-10-CM

## 2023-12-12 DIAGNOSIS — Z8659 Personal history of other mental and behavioral disorders: Secondary | ICD-10-CM

## 2023-12-12 DIAGNOSIS — E538 Deficiency of other specified B group vitamins: Secondary | ICD-10-CM

## 2023-12-12 DIAGNOSIS — I2510 Atherosclerotic heart disease of native coronary artery without angina pectoris: Secondary | ICD-10-CM

## 2023-12-12 DIAGNOSIS — Z5181 Encounter for therapeutic drug level monitoring: Secondary | ICD-10-CM | POA: Diagnosis not present

## 2023-12-12 DIAGNOSIS — Z8781 Personal history of (healed) traumatic fracture: Secondary | ICD-10-CM | POA: Diagnosis not present

## 2023-12-12 DIAGNOSIS — M47816 Spondylosis without myelopathy or radiculopathy, lumbar region: Secondary | ICD-10-CM

## 2023-12-12 DIAGNOSIS — Z8639 Personal history of other endocrine, nutritional and metabolic disease: Secondary | ICD-10-CM

## 2023-12-12 DIAGNOSIS — M19041 Primary osteoarthritis, right hand: Secondary | ICD-10-CM

## 2023-12-12 DIAGNOSIS — M25552 Pain in left hip: Secondary | ICD-10-CM

## 2023-12-12 DIAGNOSIS — M19071 Primary osteoarthritis, right ankle and foot: Secondary | ICD-10-CM

## 2023-12-12 DIAGNOSIS — M353 Polymyalgia rheumatica: Secondary | ICD-10-CM

## 2023-12-12 DIAGNOSIS — M810 Age-related osteoporosis without current pathological fracture: Secondary | ICD-10-CM | POA: Diagnosis not present

## 2023-12-12 DIAGNOSIS — Z8719 Personal history of other diseases of the digestive system: Secondary | ICD-10-CM

## 2023-12-12 DIAGNOSIS — Z79899 Other long term (current) drug therapy: Secondary | ICD-10-CM

## 2023-12-12 DIAGNOSIS — M1611 Unilateral primary osteoarthritis, right hip: Secondary | ICD-10-CM

## 2023-12-12 DIAGNOSIS — M19072 Primary osteoarthritis, left ankle and foot: Secondary | ICD-10-CM

## 2023-12-12 NOTE — Telephone Encounter (Signed)
 Routed to rx prior auth team per dr. Katrinka.

## 2023-12-12 NOTE — Patient Instructions (Signed)

## 2023-12-13 ENCOUNTER — Other Ambulatory Visit (HOSPITAL_COMMUNITY): Payer: Self-pay

## 2023-12-16 ENCOUNTER — Encounter: Payer: Self-pay | Admitting: Radiology

## 2023-12-18 ENCOUNTER — Other Ambulatory Visit (HOSPITAL_COMMUNITY): Payer: Self-pay

## 2023-12-19 ENCOUNTER — Other Ambulatory Visit (HOSPITAL_COMMUNITY): Payer: Self-pay

## 2023-12-24 ENCOUNTER — Telehealth: Payer: Self-pay | Admitting: Pharmacist

## 2023-12-24 NOTE — Telephone Encounter (Signed)
 Appeal has been submitted. Will advise when response is received or follow up in 1 week. Please be advised that most companies may take 30 days to make a decision. Appeal and supporting documentation have been faxed to [phone] on 12/24/2023 @2 :00 pm.  Thank you, Devere Pandy, PharmD Clinical Pharmacist  [facility]  Direct Dial: [phone]

## 2023-12-28 ENCOUNTER — Encounter: Payer: Self-pay | Admitting: Family Medicine

## 2023-12-30 ENCOUNTER — Other Ambulatory Visit: Payer: Self-pay

## 2023-12-30 MED ORDER — PROMETHAZINE HCL 25 MG RE SUPP: 25.0000 mg | Freq: Four times a day (QID) | RECTAL | 0 refills | Status: AC | PRN

## 2024-01-03 ENCOUNTER — Other Ambulatory Visit (HOSPITAL_COMMUNITY): Payer: Self-pay

## 2024-01-08 ENCOUNTER — Other Ambulatory Visit (HOSPITAL_COMMUNITY): Payer: Self-pay

## 2024-01-15 ENCOUNTER — Other Ambulatory Visit: Payer: Self-pay | Admitting: Family Medicine

## 2024-01-24 DIAGNOSIS — Z1211 Encounter for screening for malignant neoplasm of colon: Secondary | ICD-10-CM | POA: Diagnosis not present

## 2024-01-24 DIAGNOSIS — Z1212 Encounter for screening for malignant neoplasm of rectum: Secondary | ICD-10-CM | POA: Diagnosis not present

## 2024-01-24 NOTE — Telephone Encounter (Signed)
 Per insurance, appeal denied

## 2024-01-30 LAB — COLOGUARD: COLOGUARD: NEGATIVE

## 2024-02-03 ENCOUNTER — Other Ambulatory Visit: Payer: Self-pay | Admitting: Family Medicine

## 2024-02-03 DIAGNOSIS — E034 Atrophy of thyroid (acquired): Secondary | ICD-10-CM

## 2024-02-21 ENCOUNTER — Other Ambulatory Visit: Payer: Self-pay | Admitting: Family Medicine

## 2024-03-16 ENCOUNTER — Encounter: Payer: Self-pay | Admitting: Family Medicine

## 2024-03-16 MED ORDER — PAROXETINE HCL 20 MG PO TABS: 20.0000 mg | ORAL_TABLET | Freq: Every morning | ORAL | Status: DC

## 2024-03-17 NOTE — Telephone Encounter (Signed)
 Scheduled for 2.4.26

## 2024-03-18 ENCOUNTER — Encounter: Payer: Self-pay | Admitting: Family Medicine

## 2024-03-18 ENCOUNTER — Telehealth: Payer: Self-pay | Admitting: Family Medicine

## 2024-03-18 ENCOUNTER — Ambulatory Visit: Admitting: Family Medicine

## 2024-03-18 VITALS — BP 118/70 | HR 69 | Temp 98.2°F | Ht 61.5 in | Wt 147.8 lb

## 2024-03-18 DIAGNOSIS — F322 Major depressive disorder, single episode, severe without psychotic features: Secondary | ICD-10-CM

## 2024-03-18 MED ORDER — ESCITALOPRAM OXALATE 10 MG PO TABS: 10.0000 mg | ORAL_TABLET | Freq: Every day | ORAL | 5 refills | Status: AC

## 2024-03-18 MED ORDER — BUPROPION HCL ER (XL) 150 MG PO TB24: 150.0000 mg | ORAL_TABLET | Freq: Every day | ORAL | 5 refills | Status: AC

## 2024-03-18 NOTE — Patient Instructions (Addendum)
 Today will be your last dose of Paxil   Tomorrow start Lexapro  10 mg in the moring  In 4-7 days if tolerating the Lexapro  acan add the Wellbutrin /bupropion  150 mg XR  Very close follow up - your life is incredibly important!    Taking the medicine as directed and not missing any doses is one of the best things you can do to treat your depression.  Here are some things to keep in mind:  Side effects (stomach upset, some increased anxiety) may happen before you notice a benefit.  These side effects typically go away over time. Changes to your dose of medicine or a change in medication all together is sometimes necessary Plan for long term treatment Many people will notice an improvement within two weeks but the full effect of the medication can take up to 6 weeks Stopping the medication when you start feeling better often results in a return of symptoms If you start having thoughts of hurting yourself or others after starting this medicine, call our office immediately at [phone] or seek care through 911 or [phone]    Recommended follow up: Return in about 2 weeks (around 04/01/2024) for followup or sooner if needed.Schedule b4 you leave.

## 2024-03-18 NOTE — Progress Notes (Signed)
 " Phone [phone] In person visit   Subjective:   [name] is a 74 y.o. year old very pleasant female patient who presents for/with See problem oriented charting Chief Complaint  Patient presents with   Medication Refill    Would like to supplement her paxil  or possibly change to something else; pt is having thoughts of self harm daily;     Past Medical History-  Patient Active Problem List   Diagnosis Date Noted   Polymyalgia rheumatica 12/01/2010    Priority: High   CAD (coronary artery disease) 09/09/2007    Priority: High   Essential hypertension 05/01/2023    Priority: Medium    B12 deficiency 04/26/2022    Priority: Medium    Hyperglycemia 04/25/2022    Priority: Medium    Dyslipidemia 01/26/2019    Priority: Medium    Drug-induced myopathy 01/14/2019    Priority: Medium    High risk medication use 12/04/2018    Priority: Medium    Lower esophageal ring (Schatzki) 03/03/2018    Priority: Medium    Osteoarthritis of right hip 12/02/2017    Priority: Medium    GERD (gastroesophageal reflux disease) 12/15/2015    Priority: Medium    Vitamin D  deficiency 05/26/2014    Priority: Medium    Osteoporosis 05/26/2014    Priority: Medium    Depression 03/20/2007    Priority: Medium    Hypothyroid 02/13/1999    Priority: Medium    Polyp of colon, adenomatous 10/11/2014    Priority: Low   Hematuria 09/09/2007    Priority: Low   Unilateral primary osteoarthritis, left hip 08/24/2021    Priority: 1.   Shoulder fracture 08/10/2020    Priority: 1.   Lumbar compression fracture (HCC) 05/11/2019    Priority: 1.   Haglund's deformity 01/12/2019    Priority: 1.    Medications- reviewed and updated Current Outpatient Medications  Medication Sig Dispense Refill   amLODipine  (NORVASC ) 5 MG tablet Take 1 tablet (5 mg total) by mouth daily. 90 tablet 3   aspirin  81 MG EC tablet Take 81 mg by mouth at bedtime.     Evolocumab  (REPATHA  SURECLICK) 140 MG/ML SOAJ  Inject 140 mg into the skin every 14 (fourteen) days. 6 mL 3   ezetimibe  (ZETIA ) 10 MG tablet Take 1 tablet (10 mg total) by mouth daily. 90 tablet 3   levothyroxine  (SYNTHROID ) 100 MCG tablet TAKE 1 TABLET BY MOUTH ONCE DAILY 90 tablet 0   pantoprazole  (PROTONIX ) 40 MG tablet Take 1 tablet (40 mg total) by mouth daily. 90 tablet 3   PARoxetine  (PAXIL ) 20 MG tablet Take 1 tablet (20 mg total) by mouth every morning.     promethazine  (PHENERGAN ) 25 MG suppository Place 1 suppository (25 mg total) rectally every 6 (six) hours as needed for nausea or vomiting. 12 each 0   rosuvastatin  (CRESTOR ) 5 MG tablet Take 1 tablet (5 mg total) by mouth every other day. 45 tablet 3   Vitamin D , Ergocalciferol , (DRISDOL ) 1.25 MG (50000 UNIT) CAPS capsule TAKE 1 CAPSULE BY MOUTH EVERY 7 DAYS 13 capsule 1   predniSONE  (DELTASONE ) 5 MG tablet TAKE 1 TABLET BY MOUTH ONCE DAILY WITH BREAKFAST (Patient not taking: Reported on 03/18/2024) 90 tablet 0   No current facility-administered medications for this visit.     Objective:  BP 118/70 (BP Location: Left Arm, Patient Position: Sitting, Cuff Size: Normal)   Pulse 69   Temp 98.2 F (36.8 C) (Temporal)   Ht  5' 1.5 (1.562 m)   Wt 147 lb 12.8 oz (67 kg)   SpO2 97%   BMI 27.47 kg/m  Gen: NAD, when discussing depression appears down and tearful    Assessment and Plan    # Severe Depression S: Medication:Paxil  20 mg daily (over 20 years), trazodone  as needed for sleep- not taking lately -Prozac did not work well for sister - about a month ago her symptom(s) worsened- her son dealing with some side effects of medications and he has been dealing with a lot of anger/depression form long term disease progress- he ended up taking some of this out on his mom- no physical aggression.  - having suicidal thoughts- if had access to a gun would consider ending her life but no access and no other planning to try to do so. Called suicide crisis line one night and very  helpful -already in epilepsy support group    03/18/2024   11:25 AM 11/27/2023    9:17 AM 05/27/2023    9:54 AM  Depression screen PHQ 2/9  Decreased Interest 3 0 0  Down, Depressed, Hopeless 3 0 0  PHQ - 2 Score 6 0 0  Altered sleeping 3 0 0  Tired, decreased energy 3 3 0  Change in appetite 3 0 0  Feeling bad or failure about yourself  3 0 0  Trouble concentrating 3 0 0  Moving slowly or fidgety/restless 0 0 0  Suicidal thoughts 3 0 0  PHQ-9 Score 24 3  0   Difficult doing work/chores Extremely dIfficult Not difficult at all Not difficult at all     Data saved with a previous flowsheet row definition       03/18/2024   11:26 AM 11/27/2023    9:17 AM 04/25/2022    8:46 AM  GAD 7 : Generalized Anxiety Score  Nervous, Anxious, on Edge 3 0  0   Control/stop worrying 3 0  1   Worry too much - different things 3 0  1   Trouble relaxing 3 0  1   Restless 3 0  0   Easily annoyed or irritable 3 0  0   Afraid - awful might happen 3 0  1   Total GAD 7 Score 21 0 4  Anxiety Difficulty Extremely difficult Not difficult at all Somewhat difficult     Data saved with a previous flowsheet row definition   A/P: Severe situational surge in depression with caregiver burden weighing heavily on her.  PHQ-9 up to 24 with daily thoughts of being better off dead and at times thoughts of harming herself with a gun but she does not have access to it does not plan to try to gain access to. - Discussed option of psychiatry referral given severity of symptoms or you could consider hospitalization but patient strongly request to trial change of medication first and is able to contract for safety agrees to call 911 or [phone] if more intrusive thoughts - Close follow-up in 2 weeks - Stop Paxil  - Start Lexapro  10 mg - After 4 to 7 days can add Wellbutrin  though could worsen anxiety I think treating depression is the primary need at this point - We also considered Remeron given poor sleep-she is no longer  taking trazodone  but I think treating depression better will likely be beneficial - Strongly encourage therapist but she declines -Discussed using support groups but she is already enrolled in an epilepsy support group and does find this somewhat helpful -  limited friend/family support as not understanding of her situation with caring for son with epilepsya  Recommended follow up: Return in about 2 weeks (around 04/01/2024) for followup or sooner if needed.Schedule b4 you leave. Future Appointments  Date Time Provider Department Center  04/07/2024  2:20 PM [facility] [place] VISIT 1 [facility]  05/28/2024  2:00 PM Katrinka Garnette KIDD, MD [facility]  06/11/2024  1:00 PM Dolphus Reiter, MD [facility] None    Lab/Order associations: No diagnosis found.  No orders of the defined types were placed in this encounter.   Return precautions advised.  Garnette Katrinka, MD  "

## 2024-03-18 NOTE — Telephone Encounter (Signed)
 LVM for pt to reschedule a 2 week follow-up from today's visit since this was not generated while pt was on site. Left callback number

## 2024-04-07 ENCOUNTER — Ambulatory Visit: Payer: PPO

## 2024-05-28 ENCOUNTER — Ambulatory Visit: Admitting: Family Medicine

## 2024-06-11 ENCOUNTER — Ambulatory Visit: Admitting: Rheumatology
